# Patient Record
Sex: Male | Born: 1950
Health system: Southern US, Community
[De-identification: ages and names within clinical notes are randomized; demographics above are authoritative.]

## PROBLEM LIST (undated history)

## (undated) DIAGNOSIS — I10 Essential (primary) hypertension: Secondary | ICD-10-CM

## (undated) DIAGNOSIS — K922 Gastrointestinal hemorrhage, unspecified: Secondary | ICD-10-CM

## (undated) DIAGNOSIS — I480 Paroxysmal atrial fibrillation: Secondary | ICD-10-CM

## (undated) DIAGNOSIS — F32A Depression, unspecified: Secondary | ICD-10-CM

## (undated) DIAGNOSIS — I714 Abdominal aortic aneurysm, without rupture: Secondary | ICD-10-CM

## (undated) DIAGNOSIS — I2699 Other pulmonary embolism without acute cor pulmonale: Secondary | ICD-10-CM

## (undated) DIAGNOSIS — G8929 Other chronic pain: Secondary | ICD-10-CM

## (undated) DIAGNOSIS — R9431 Abnormal electrocardiogram [ECG] [EKG]: Secondary | ICD-10-CM

## (undated) DIAGNOSIS — F329 Major depressive disorder, single episode, unspecified: Secondary | ICD-10-CM

## (undated) DIAGNOSIS — R600 Localized edema: Secondary | ICD-10-CM

## (undated) DIAGNOSIS — F1021 Alcohol dependence, in remission: Secondary | ICD-10-CM

## (undated) DIAGNOSIS — E785 Hyperlipidemia, unspecified: Secondary | ICD-10-CM

## (undated) DIAGNOSIS — D649 Anemia, unspecified: Secondary | ICD-10-CM

## (undated) DIAGNOSIS — R911 Solitary pulmonary nodule: Secondary | ICD-10-CM

## (undated) DIAGNOSIS — I2721 Secondary pulmonary arterial hypertension: Secondary | ICD-10-CM

## (undated) DIAGNOSIS — Z6841 Body Mass Index (BMI) 40.0 and over, adult: Secondary | ICD-10-CM

## (undated) DIAGNOSIS — G4733 Obstructive sleep apnea (adult) (pediatric): Secondary | ICD-10-CM

## (undated) DIAGNOSIS — M170 Bilateral primary osteoarthritis of knee: Secondary | ICD-10-CM

## (undated) DIAGNOSIS — L409 Psoriasis, unspecified: Secondary | ICD-10-CM

## (undated) DIAGNOSIS — S7010XA Contusion of unspecified thigh, initial encounter: Secondary | ICD-10-CM

## (undated) DIAGNOSIS — Z7901 Long term (current) use of anticoagulants: Secondary | ICD-10-CM

## (undated) DIAGNOSIS — I723 Aneurysm of iliac artery: Secondary | ICD-10-CM

## (undated) HISTORY — DX: Body Mass Index (BMI) 40.0 and over, adult: Z684

## (undated) HISTORY — DX: Morbid (severe) obesity due to excess calories: E66.01

## (undated) HISTORY — DX: Localized edema: R60.0

## (undated) HISTORY — DX: Psoriasis, unspecified: L40.9

## (undated) HISTORY — DX: Abnormal electrocardiogram (ECG) (EKG): R94.31

## (undated) HISTORY — PX: KNEE ARTHROSCOPY: SHX127

## (undated) HISTORY — DX: Secondary pulmonary arterial hypertension: I27.21

## (undated) HISTORY — DX: Paroxysmal atrial fibrillation: I48.0

## (undated) HISTORY — DX: Alcohol dependence, in remission: F10.21

## (undated) HISTORY — DX: Gastrointestinal hemorrhage, unspecified: K92.2

## (undated) HISTORY — DX: Abdominal aortic aneurysm, without rupture: I71.4

## (undated) HISTORY — DX: Anemia, unspecified: D64.9

## (undated) HISTORY — DX: Hyperlipidemia, unspecified: E78.5

## (undated) HISTORY — PX: ACHILLES TENDON SURGERY: SHX542

## (undated) HISTORY — DX: Contusion of unspecified thigh, initial encounter: S70.10XA

## (undated) HISTORY — DX: Essential (primary) hypertension: I10

## (undated) HISTORY — DX: Long term (current) use of anticoagulants: Z79.01

## (undated) HISTORY — DX: Aneurysm of iliac artery: I72.3

## (undated) HISTORY — DX: Depression, unspecified: F32.A

## (undated) HISTORY — DX: Bilateral primary osteoarthritis of knee: M17.0

## (undated) HISTORY — DX: Major depressive disorder, single episode, unspecified: F32.9

## (undated) HISTORY — DX: Other pulmonary embolism without acute cor pulmonale: I26.99

## (undated) HISTORY — DX: Obstructive sleep apnea (adult) (pediatric): G47.33

## (undated) HISTORY — DX: Solitary pulmonary nodule: R91.1

---

## 1997-01-12 HISTORY — PX: KNEE SURGERY: SHX244

## 1997-05-04 ENCOUNTER — Other Ambulatory Visit: Admission: RE | Admit: 1997-05-04 | Discharge: 1997-05-04 | Payer: Self-pay | Admitting: Orthopedic Surgery

## 1998-12-03 ENCOUNTER — Ambulatory Visit (HOSPITAL_BASED_OUTPATIENT_CLINIC_OR_DEPARTMENT_OTHER): Admission: RE | Admit: 1998-12-03 | Discharge: 1998-12-03 | Payer: Self-pay | Admitting: Orthopedic Surgery

## 1999-10-12 ENCOUNTER — Emergency Department (HOSPITAL_COMMUNITY): Admission: EM | Admit: 1999-10-12 | Discharge: 1999-10-12 | Payer: Self-pay | Admitting: Emergency Medicine

## 1999-10-12 ENCOUNTER — Encounter: Payer: Self-pay | Admitting: Emergency Medicine

## 2001-01-20 ENCOUNTER — Encounter
Admission: RE | Admit: 2001-01-20 | Discharge: 2001-04-20 | Payer: Self-pay | Admitting: Physical Medicine and Rehabilitation

## 2001-06-16 ENCOUNTER — Encounter
Admission: RE | Admit: 2001-06-16 | Discharge: 2001-09-14 | Payer: Self-pay | Admitting: Physical Medicine and Rehabilitation

## 2006-01-12 DIAGNOSIS — I2699 Other pulmonary embolism without acute cor pulmonale: Secondary | ICD-10-CM

## 2006-01-12 DIAGNOSIS — R911 Solitary pulmonary nodule: Secondary | ICD-10-CM

## 2006-01-12 HISTORY — DX: Solitary pulmonary nodule: R91.1

## 2006-01-12 HISTORY — DX: Other pulmonary embolism without acute cor pulmonale: I26.99

## 2006-08-13 ENCOUNTER — Ambulatory Visit: Payer: Self-pay | Admitting: Vascular Surgery

## 2006-08-13 ENCOUNTER — Ambulatory Visit: Payer: Self-pay | Admitting: Pulmonary Disease

## 2006-08-13 ENCOUNTER — Encounter (INDEPENDENT_AMBULATORY_CARE_PROVIDER_SITE_OTHER): Payer: Self-pay | Admitting: Internal Medicine

## 2006-08-13 ENCOUNTER — Ambulatory Visit: Payer: Self-pay | Admitting: Cardiology

## 2006-08-13 ENCOUNTER — Inpatient Hospital Stay (HOSPITAL_COMMUNITY): Admission: EM | Admit: 2006-08-13 | Discharge: 2006-09-03 | Payer: Self-pay | Admitting: *Deleted

## 2008-05-08 IMAGING — CT CT PELVIS W/O CM
2 of 4 series · 15 of 46 positions shown, 17 images · non-contrast
Comparison: None

ABDOMEN CT WITHOUT CONTRAST

CLINICAL DATA: Pulmonary embolism. Increased gas and hyperactive bowel sounds
with no pain. Question ascites.
TECHNIQUE: Multidetector CT imaging of the abdomen and pelvis was performed
following the standard protocol without administration of intravenous contrast.

[Series 4: abd/pelv w/o 5.0 b31f st · axial · non-contrast · 0.92mm/px · z∈[-539,-59]mm · 12 of 110 slices shown, 14 images]
[im 9/110  soft-tissue]
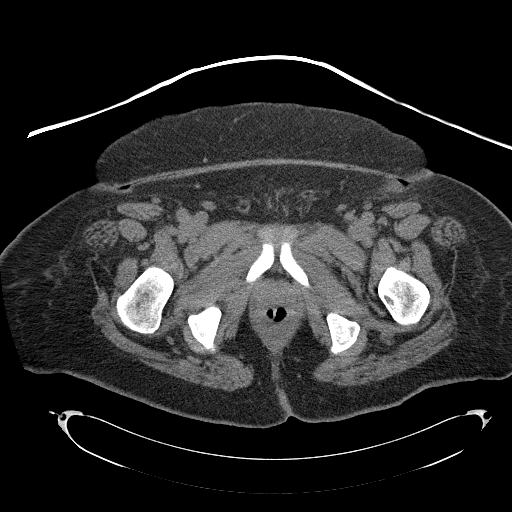
[im 9/110  bone]
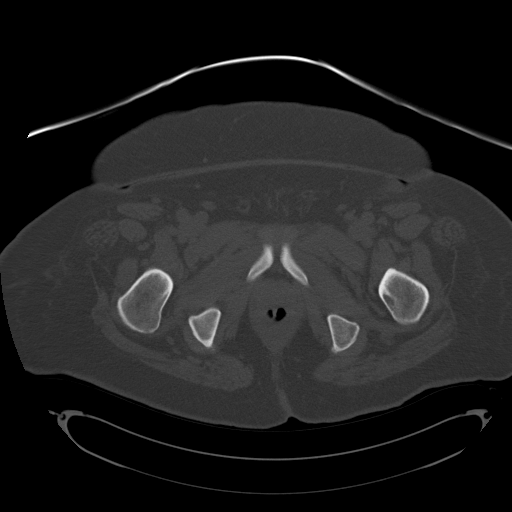
[im 18/110  soft-tissue]
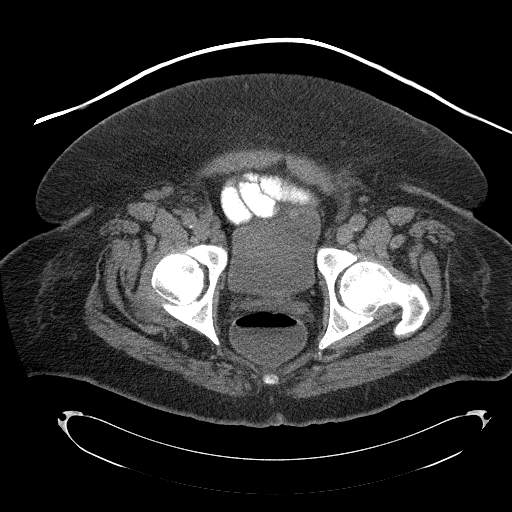
[im 27/110  soft-tissue]
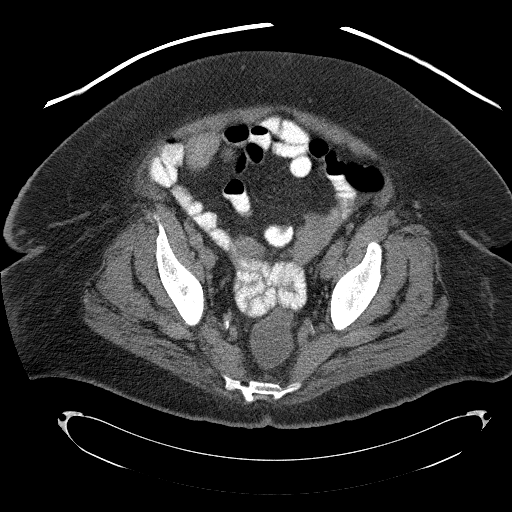
[im 35/110  soft-tissue]
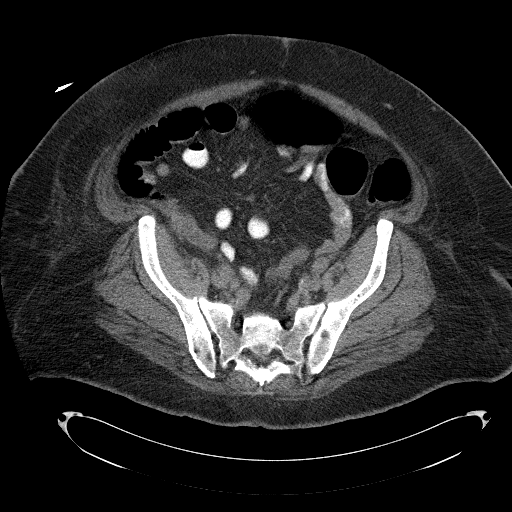
[im 44/110  soft-tissue]
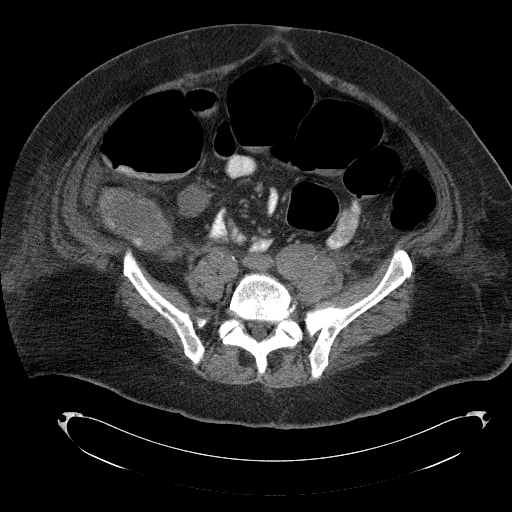
[im 53/110  soft-tissue]
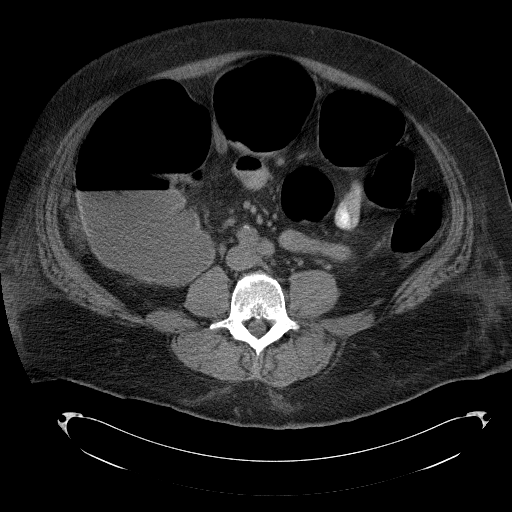
[im 62/110  soft-tissue]
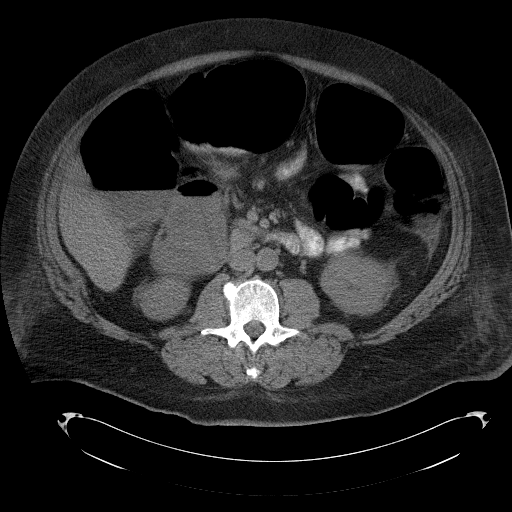
[im 70/110  soft-tissue]
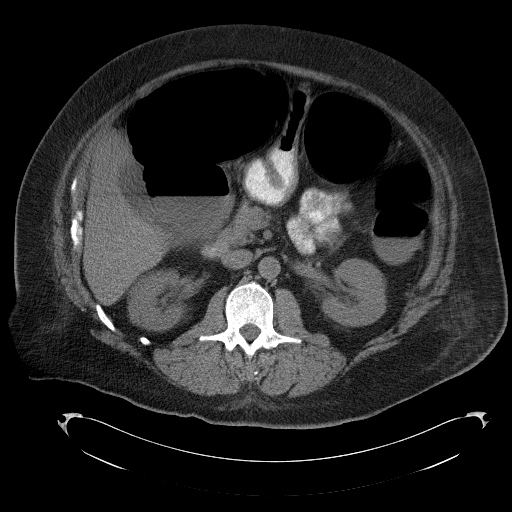
[im 79/110  soft-tissue]
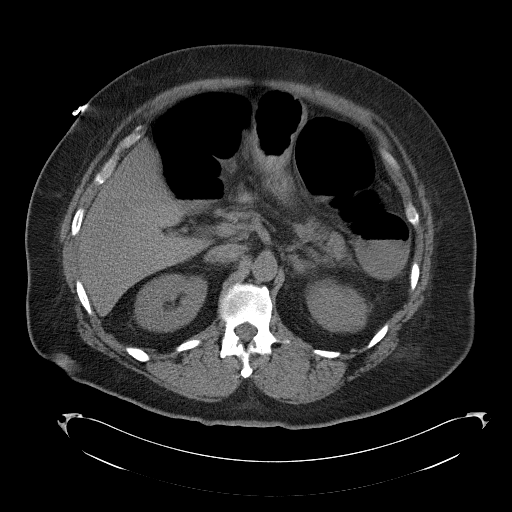
[im 79/110  bone]
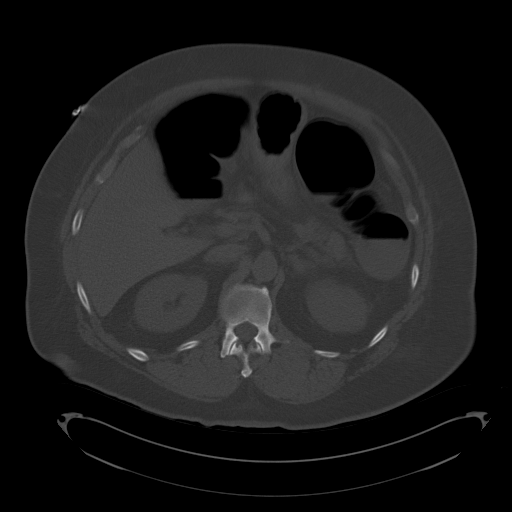
[im 88/110  soft-tissue]
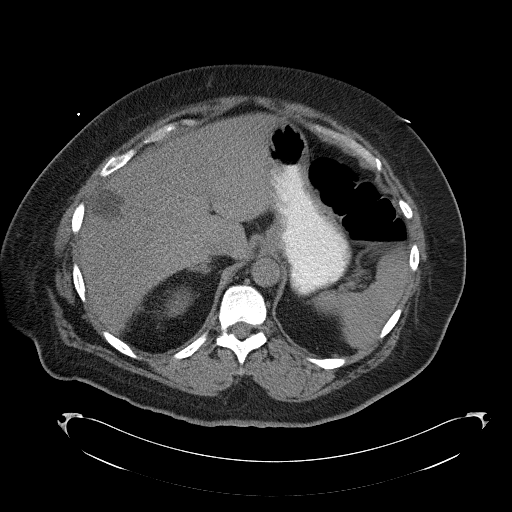
[im 96/110  soft-tissue]
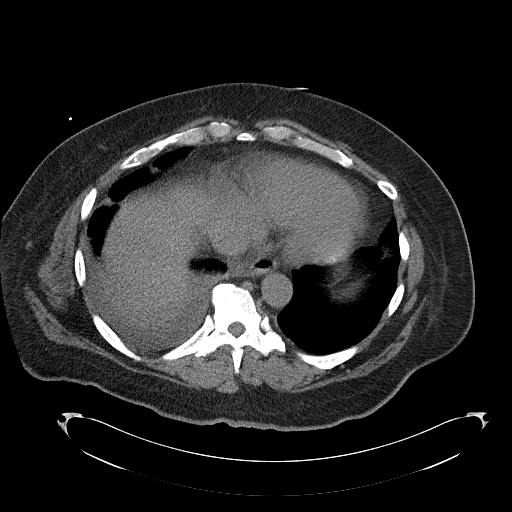
[im 105/110  soft-tissue]
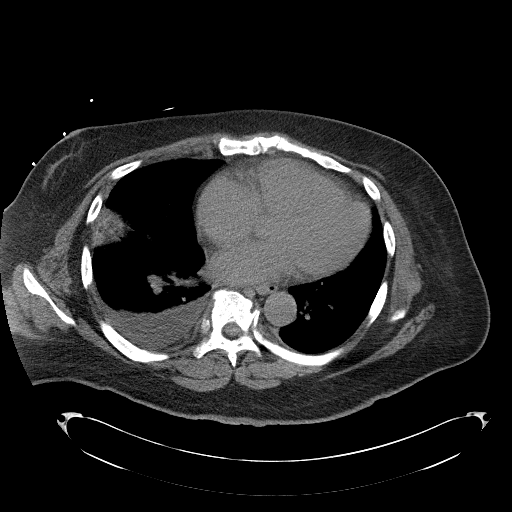

[Series 602: coronal abd · coronal · 1.07mm/px · 3 of 167 slices shown]
[im 56/167  soft-tissue]
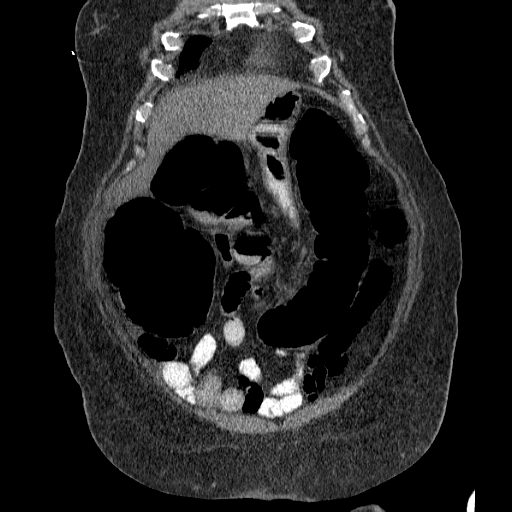
[im 74/167  soft-tissue]
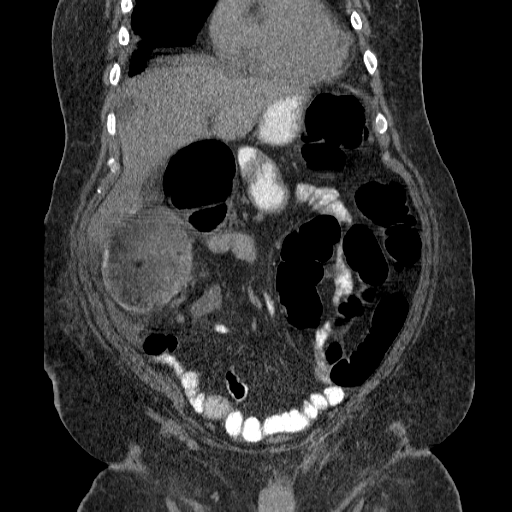
[im 93/167  soft-tissue]
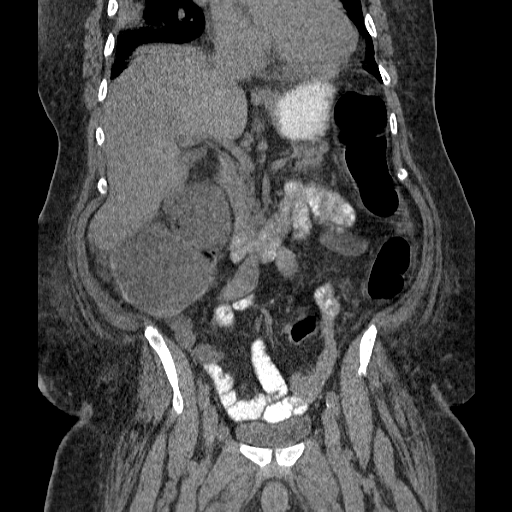

[15 of 46 positions shown; findings below may reference images not displayed]

FINDINGS: Motion artifact limits evaluation of lung bases. Patchy right middle
lobe air space disease could relate to atelectasis or infarct and is increased.
The nodular density described at the right lung base on the prior chest CT is
again identified on image 10 and measures similarly.

Moderate cardiomegaly. New small right pleural effusion. Low-density right
hepatic lobe lesion measures 3.2 cm and is most likely a cyst. A moderate motion
artifact involves the abdominal portions as well. Normal spleen, stomach,
pancreas, gallbladder, adrenal glands. Normal kidneys. Aneurysmal dilatation of
the right common iliac artery to 2.7 cm image 61.

No retroperitoneal or retrocrural adenopathy.

The colon is diffusely moderately distended and gas and fluid filled. There is
no colonic wall thickening or free intraperitoneal air. small bowel is of normal
caliber. Small amount of ascites in the right paracolic gutter on image 61.

IMPRESSION

1. Moderate colonic distention. Please see discussion in the pelvis section.
2. Worsening appearance of the right hemithorax with probable evolving infarct
and small-moderate right-sided pleural effusion.
3. Probable hepatic cyst.
4. Aneurysmal dilatation of the right common iliac artery to approximately
cm.
5. Trace ascites.
PELVIS CT WITHOUT CONTRAST
FINDINGS: Rectum is fluid filled. The colon is distended other than an area of
underdistention within the sigmoid. There is no convincing evidence of causative
mass at this point. Pelvic small bowel is of normal caliber. 

Aneurysm of the right common iliac terminates prior to the common iliac
bifurcation. Soft tissue fullness at the distal left common iliac artery on
image 67 is suspicious for aneurysm at approximately 3.2 cm. This area is
suboptimally evaluated without contrast.

Subtle edema is seen along the common iliac vasculature and iliopsoas muscles on
image 69 bilaterally. Ill-defined fat planes within the left iliopsoas complex
on image 67.

Prominent pelvic sidewall lymph nodes bilaterally. Index right external iliac
node measures 11 mm.

Normal urinary bladder and prostate.

Normal bones. Age advanced spondylosis in the lower lumbar spine incidentally
noted.

IMPRESSION

1. Moderate colonic distention throughout except for an area of underdistention
in the sigmoid. This is felt most likely be diffuse colonic ileus with
incidentally under distended sigmoid. If this distention persists, repeat
imaging or colonoscopy would be suggested to exclude a less likely partially
obstructive process at the sigmoid colon.
2. Bilateral common iliac artery aneurysms, suboptimally evaluated on this
unenhanced CT. Subtle surrounding retroperitoneal edema including along the
iliopsoas muscles. This could be incidental and due to diffuse body wall edema.
However, given the aneurysms and the patient's presumed anticoagulated state,
clinical correlation is recommended. If patient has developing pelvic symptoms,
repeat imaging should be considered to exclude developing iliopsoas hematoma or
less likely common iliac artery aneurysm bleed.
3. Mild pelvic adenopathy without cause identified.

## 2008-05-09 IMAGING — CR DG ABD PORTABLE 1V
1 series · 1 of 1 positions shown · non-contrast
Comparison: none

CLINICAL DATA: Abdominal distention.  
 PORTABLE ABDOMEN ? 1 VIEW ? 08/21/06 ? 9399 HOURS:

[view not recorded]
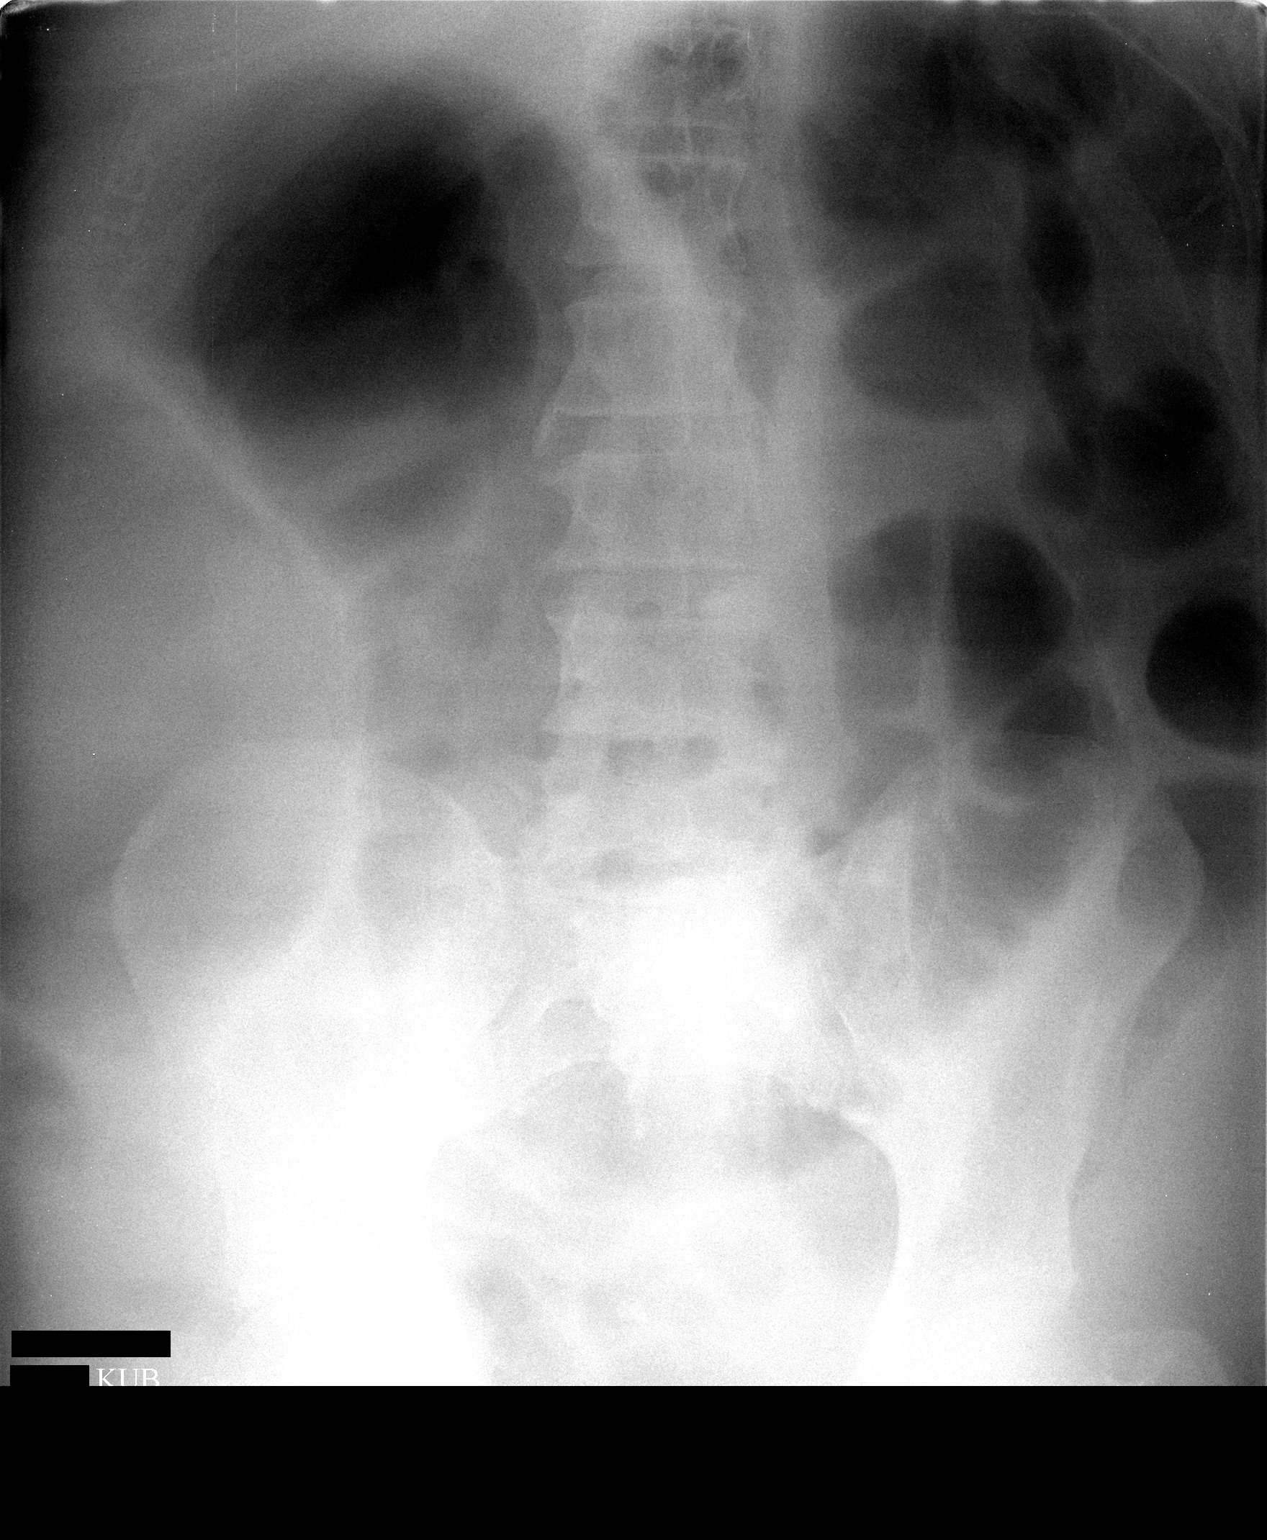

[1 of 1 positions shown; findings below may reference images not displayed]

FINDINGS: The exam is limited by motion artifact.  Colonic distention is grossly stable.  There is no obvious free intraperitoneal gas or pneumatosis.
IMPRESSION: Colonic distention.  No significant change.

## 2010-02-28 DIAGNOSIS — I480 Paroxysmal atrial fibrillation: Secondary | ICD-10-CM

## 2010-02-28 HISTORY — DX: Paroxysmal atrial fibrillation: I48.0

## 2010-05-01 ENCOUNTER — Other Ambulatory Visit: Payer: Self-pay | Admitting: Cardiology

## 2010-05-01 ENCOUNTER — Ambulatory Visit
Admission: RE | Admit: 2010-05-01 | Discharge: 2010-05-01 | Disposition: A | Discharge status: Home / Self Care | Payer: Medicare Other | Source: Ambulatory Visit | Attending: Cardiology | Admitting: Cardiology

## 2010-05-01 DIAGNOSIS — R0602 Shortness of breath: Secondary | ICD-10-CM

## 2010-05-05 ENCOUNTER — Ambulatory Visit (HOSPITAL_COMMUNITY)
Admission: RE | Admit: 2010-05-05 | Discharge: 2010-05-05 | Disposition: A | Discharge status: Home / Self Care | Payer: Medicare Other | Source: Ambulatory Visit | Attending: Cardiology | Admitting: Cardiology

## 2010-05-05 DIAGNOSIS — Z01810 Encounter for preprocedural cardiovascular examination: Secondary | ICD-10-CM | POA: Insufficient documentation

## 2010-05-05 DIAGNOSIS — Z538 Procedure and treatment not carried out for other reasons: Secondary | ICD-10-CM | POA: Insufficient documentation

## 2010-05-05 DIAGNOSIS — I4891 Unspecified atrial fibrillation: Secondary | ICD-10-CM | POA: Insufficient documentation

## 2010-05-27 NOTE — H&P (Signed)
[name], [name]                 ACCOUNT NO.:  [id number]   MEDICAL RECORD NO.:  [id number]          PATIENT TYPE:  INP   LOCATION:  2902                         FACILITY:  [facility]   PHYSICIAN:  Hind I Elsaid, MD      DATE OF BIRTH:  12/16/1950   DATE OF ADMISSION:  08/12/2006  DATE OF DISCHARGE:                              HISTORY & PHYSICAL   PRIMARY CARE PHYSICIAN:  __________   CHIEF COMPLAINT:  Shortness of breath.   HISTORY OF PRESENT ILLNESS:  This is a 60 year old male with a history  of psoriasis admitted to the hospital with a chief complaint of sudden  onset of shortness of breath which started on Sunday.  Patient  complained of shortness of breath mainly on mild exertion which is  happening suddenly, relieved by sitting.  Patient denies any history of  wheezing.  Denies any lower extremity swelling.  Denies any fever.  Denies any cough.  Denies any chest pain.  Patient also felt some  dizziness and about to pass out on Sunday.  Denies any headache,  numbness or weakness of his extremities.   PAST MEDICAL HISTORY:  Psoriasis.   MEDICATIONS:  None.   PAST SURGICAL HISTORY:  History of bilateral knee arthroscopic  procedures, repair of  tendinitis and left knee surgeries.   ALLERGIES:  NO KNOWN DRUG ALLERGIES.   SYSTEMIC REVIEW:  Review HPI.   SOCIAL HISTORY:  He lives by himself.  He is divorced.  Quit smoking on  Monday, but he can be put on the list of current smoker, half a pack a  day for 20 years.  Drinks 1 bottle daily for 20 years.  Last drink was  Monday.  History of marijuana abuse, stopped more than 1 year ago.  Patient denies any history of DTs in the past.   FAMILY HISTORY:  Denies any history of clot in his family.   PHYSICAL EXAMINATION:  NEUROLOGIC:  Patient is alert and oriented x3.  VITAL SIGNS:  Temperature 97.2, blood pressure 143/94, pulse rate 99,  respiratory rate 24, 98% on room air.  GENERAL:  Obese African American male.  RESPIRATORY:   Not in respiratory distress.  Mild shortness of breath.  HEENT:  Normocephalic and atraumatic.  Pupils equal and reactive to  light and accommodation.  NECK:  Has a double chin.  No lymphadenopathy.  Evaluated the JVP.  LUNGS:  No vesicular breathing.  HEART:  S1 and S2.  No added sounds.  ABDOMEN:  Obese, nontender.  Bowel sounds positive.  No organomegaly.  EXTREMITIES:  No lower limb edema.  SKIN:  Multiple skin rashes secondary to psoriasis.   LABORATORY DATA:  Blood work up:  Sodium 136, potassium 30.8, chloride  106, BUN 18, glucose 90, bicarb 22, white blood cell 9.3, hemoglobin  15.7. hematocrit 47.2, platelets 152.  Troponin was negative.  D-dimer  more than 20.  CT scan showed positive for pulmonary embolism  bilaterally.  Dilatation of the right ventricular pulmonary artery such  as right ventricular strain.  Noted density of the right lung base,  pleuritic base and probably benign.  Correlation was on ultrasound.  In  the absence of exam, I will suggest CT followup nonspecific 3.4 cm  lesion in the right hepatic lobe.   ASSESSMENT AND PLAN:  1. Massive bilateral pulmonary embolism with the possibility of right      ventricular strain.  Patient is hemodynamically stable at this      time.  Admit to stepdown.  Start heparin drip.  Check CBC and      PT/INR.  We will consider pulmonary consult for evaluation for      possibility of thrombolytic therapy although patent has good vital      sign and haemodynamically stable ,venous doppler of bilateral      extremities was orderd too.  We will get 2-D echocardiogram for      evaluation of the right heart strain.  2. History of alcohol abuse.  We will continue with thiamnie and      folate no need for ativan at this tim,t.  Patient has not had      delirium tremens at this time.  3. Psoriasis.  Continue home medications for psoriasis.  4. Deep venous thrombosis and gastrointestinal prophylaxis.      Hind Bosie Helper, MD   Electronically Signed     HIE/MEDQ  D:  08/13/2006  T:  08/13/2006  Job:  [id number]

## 2010-05-27 NOTE — Discharge Summary (Signed)
[name], [name]                 ACCOUNT NO.:  [id number]   MEDICAL RECORD NO.:  [id number]          PATIENT TYPE:  INP   LOCATION:  2902                         FACILITY:  [facility]   PHYSICIAN:  Lonia Blood, M.D.       DATE OF BIRTH:  [birth year]   DATE OF ADMISSION:  08/13/2006  DATE OF DISCHARGE:                               DISCHARGE SUMMARY   PRIMARY CARE PHYSICIAN:  Dr. Leonard Downing   DISCHARGE DIAGNOSES:  1. Submassive pulmonary embolus affecting the main branches of the      pulmonary arteries with right ventricular strain.  2. Estimated pulmonary artery pressure of 85 mmHg.  3. Obesity.  4. A 2.5 cm right lower lobe nodule - the patient will need workup      when more stable clinically  5. Paroxysmal atrial fibrillation, resolved.  6. Paroxysmal atrial flutter resolved.  7. Streptococcus viridans urinary tract infection with urinary      retention.  8. Mild hematuria in the setting of a Foley catheter and heparin      anticoagulation.   DISCHARGE MEDICATIONS:  Coumadin dose to be determined at the time of  discharge.   CONDITION ON DISCHARGE:  Will be dictated at the time of the actual  discharge of the patient.  Of note, Mr. Common has a visit scheduled with  his new primary care physician Dr. Leonard Downing.   PROCEDURES:  1. During this admission on August 13 2006, the patient underwent      computer tomography scan of the chest.  Findings of right lower      lobe pulmonary nodule of 2.3 cm, massive pulmonary embolus      bilaterally with a large saddle embolus straddling the proximal      right and left pulmonary arteries, nearly occlusive thrombus in the      right main pulmonary artery, dilatation of the right ventricle and      a nonspecific 3.4 cm mass in the right hepatic lobe.   1. August 14, 2006, transthoracic echocardiogram.  Findings of      pulmonary artery hypertension 85 mmHg, ejection fraction of 65%,      right ventricle moderately dilated right  ventricular systolic      function was moderately reduced.   CONSULTATIONS:  This patient was seen in consultation by Dr. Shan Levans from pulmonary.   HISTORY AND PHYSICAL:  Refer to the dictation done by Dr. Eda Paschal on  August 13, 2006.   HOSPITAL COURSE:  #1 - MASSIVE PULMONARY EMBOLISM.  Mr. Dismuke was  admitted to the intensive care unit of [facility] and was  placed on heparin drip.  The patient was kept on bedrest initially due  to his orthostatic hypotension.  The patient never became shocky. Mr.  Mclester had a couple of episodes of atrial fibrillation and one episode of  atrial flutter.  Arrhythmias were probably related to the pulmonary  emboli that patient suffered.  We have checked lower extremity venous  Dopplers which were positive for a popliteal tibial vein thrombosis on  the  left only.  The patient did not receive the tissue plasminogen  activator due to the fact that he was never in any hemodynamic  compromise.  We also decided not to place an IVC filter given the fact  that the residual clot burden was low.  At this point in time, it is  unclear why Mr. Doucette developed this massive pulmonary embolus.  He does  not have any family history of coagulopathy, and his genetic testing for  factor V Leiden,  antithrombin III, prothrombin gene mutation, are all  negative.  We have also tested this patient for the presence of  antiphospholipid antibody, and we did not detect the presence of the  lupus anticoagulant.  Mr. Maselli has a right lower lobe pulmonary nodule  and a hepatic nodule which both need to be investigated for possible  malignancy, once he is more hemodynamically stable.  The current plan is  to treat the patient with Coumadin for a month and repeat the CT scan of  his chest, abdomen and pelvis to evaluate the findings on the first  computer tomography scan.  Mr. Lard also probably will warrant  colonoscopy at some point in the future too.   #2 -  URINARY RETENTION.  On hospital day #2, Mr. Kray was unable to  void.  Foley catheter was inserted and urine culture was sent.  Urine  culture was positive for Streptococcus, and the patient was treated with  5 days of amoxicillin.  A PSA was checked and it was within normal  limits.   #3 - The current plan of treatment involves increase mobilization of the  patient.  Ongoing heparin drip until the INR is 2.5 for two consecutive  days.  The final decision to discharge this patient will be taken by Dr.  Eda Paschal, depending on further hospital course of the patient.      Lonia Blood, M.D.  Electronically Signed     SL/MEDQ  D:  08/17/2006  T:  08/18/2006  Job:  [id number]

## 2010-05-27 NOTE — Discharge Summary (Signed)
[name], [name]                 ACCOUNT NO.:  [id number]   MEDICAL RECORD NO.:  [id number]          PATIENT TYPE:  INP   LOCATION:  2031                         FACILITY:  [facility]   PHYSICIAN:  Elliot Cousin, M.D.    DATE OF BIRTH:  05-May-1950   DATE OF ADMISSION:  08/12/2006  DATE OF DISCHARGE:  09/03/2006                               DISCHARGE SUMMARY   ADDENDUM DISCHARGE SUMMARY:  PLEASE SEE THE PREVIOUS DISCHARGE SUMMARIES  DICTATED BY DR. Eda Paschal ON August 24, 2006 AND DR. LAZA ON August 17, 2006.   FINAL DISCHARGE DIAGNOSES:  1. Left lower extremity infected hematoma, status post local incision      and drainage at the bedside, per Dr. Jamey Ripa, on August 30, 2006.  2. Massive pulmonary embolism with evidence of right ventricular      strain and possible right lower lobe infarction.  3. Paroxysmal atrial fibrillation.  4. Prolonged Q-T interval.   SECONDARY DISCHARGE DIAGNOSES:  1. Persistent hypokalemia.  The patient's serum potassium is now      within normal limits.  2. Ileus.  The patient's ileus resolved during the hospital course.  3. A 2.5 cm right lower lobe nodule.  Follow-up CT scan of the chest      recommended in 3-6 months.  4. Mild hematuria in a setting of a Foley catheter and heparin      anticoagulation, resolved during the hospital course.  5. Streptococcus viridans urinary tract infection with urinary      retention.  Treated and resolved during the hospital course.  6. Thrombophlebitis of the left hand, resolved during the hospital      course.   DISCHARGE MEDICATIONS:  1. Cardizem 360 mg daily.  2. Digoxin 0.125 mg daily (this medication may be discontinued over      the next week or two per the discretion of his primary care      physician).  3. Augmentin 875 mg 1 p.o. b.i.d. for three more days.  4. Potassium chloride 20 mEq b.i.d. for one more week, then one pill      daily.  5. Tussionex cough syrup 5 cc every 12 hours as needed for cough.  6.  Coumadin 5 mg.  Take 2 tablets at 6 p.m. every day.   DISCHARGE DISPOSITION:  Patient was discharged to home in improved and  stable condition on September 03, 2006.  He has a hospital follow-up  appointment with Dr. Kern Reap on Monday, August 25th, at 11:30  a.m.  He was advised to follow up with general surgeon, Dr. Jamey Ripa, in  two weeks.   CONSULTATIONS:  Dr. Carolynne Edouard, Dr. Jamey Ripa, general surgery.   HOSPITAL COURSE:  1. LEFT LOWER EXTREMITY INFECTED HEMATOMA:  On August 17, the patient      was noted to have a large fluctuant, painful area within his left      inner thigh.  There was a concern regarding a hematoma versus an      abscess.  General surgeon, Dr. Carolynne Edouard, was consulted.  Given that the  patient was on anticoagulation, he advised a mild reversal of the      Coumadin before proceeding with a possibility of draining the area.      The following day, Dr. Billey Chang colleague, Dr. Jamey Ripa, evaluated the      patient.  He performed a bedside/local drainage and I&D of the left      inner thigh fluctuant area.  Dr. Jamey Ripa sent the specimen to the      lab for culture.  The patient was started empirically on Zosyn      intravenously.   The I&D site was dressed daily with wet-to-dry dressing changes and  packing.  The area has drained serosanguineous fluid over the past few  days, which has been minor.  His hemoglobin has remained stable over the  past few days.  It took several days for the culture to grow out.  In  fact, the specimen had to be incubated for better growth.  Eventually,  the culture grew out multiple species, none strep and none staph aureus.  The patient has completed five days of Zosyn therapy.  He will be  discharged to home on three more days of Augmentin.  The patient is  completely afebrile, and his white blood cell count is within normal  limits at 9.1.  The patient will follow up with Dr. Jamey Ripa in two weeks,  per his schedule.   1. PULMONARY EMBOLISM:   The patient's oxygen saturations continued to      improved during the hospitalization.  Over the past few days, his      oxygen saturations have ranged from 95% to 98% on room air.  He has      no complaints of chest pain or shortness of breath.  He is being      actively anticoagulated with Coumadin.  As of yesterday, his INR      was 2.3.  The day prior, it was 2.  The dosing of the Coumadin has      ranged from 7.5 to 12.5 mg daily.  He will be discharged to home on      10 mg of Coumadin daily.  Patient was advised to follow up with Dr.      Barbee Shropshire in three days to have his INR reassessed.   1. PAROXYSMAL ATRIAL FIBRILLATION:  The patient was started on      Cardizem for rate control.  The dose was titrated up to 360 mg      daily.  Eventually, digoxin had to be added.  He is currently      receiving 0.125 mg of digoxin daily.  Over the past 48 hours, his      heart rate has ranged from 80 to 95 beats per minute.  The follow-      up EKG revealed a prolonged Q-T interval of 428 with a QTC of 487.      More than likely, the prolonged Q-T interval was secondary to      digoxin therapy.  The EKG also shows nonspecific T wave      abnormalities and possible left atrial enlargement.  The patient      has had no complaints of chest pain over the past three days.  His      EKG changes will need to be monitored in the outpatient setting.      It is possible that digoxin can be discontinued over the next week      or  two.  This decision will be deferred to his primary care      physician, Dr. Barbee Shropshire.   1. PERSISTENT HYPOKALEMIA:  The patient is currently receiving 20 mEq      of potassium chloride twice daily.  His magnesium level was      assessed and found to be within normal limits at 2.1 today.  He was      advised to continue potassium chloride b.i.d. for one week and then      to decrease the dose to once daily.  The patient's serum potassium      will need to be followed closely  in the outpatient setting.   FOLLOW-UP DISCHARGE DISPOSITION:  The [facility] agency  has been consulted to provide the patient with wound care.  Per Dr.  Tenna Child instructions, the left medial thigh wound should be dressed  with wet-to-dry dressings and packing with a dry dressing cover.      Elliot Cousin, M.D.  Electronically Signed    DF/MEDQ  D:  09/03/2006  T:  09/04/2006  Job:  [id number]   cc:   Olene Craven, M.D.  Currie Paris, M.D.

## 2010-05-27 NOTE — Discharge Summary (Signed)
NAMESHEDDRICK, LATTANZIO                 ACCOUNT NO.:  [id number]   MEDICAL RECORD NO.:  [id number]          PATIENT TYPE:  INP   LOCATION:  2923                         FACILITY:  [facility]   PHYSICIAN:  Hind I Elsaid, MD      DATE OF BIRTH:  [birth year]   DATE OF ADMISSION:  08/12/2006  DATE OF DISCHARGE:                               DISCHARGE SUMMARY   INTERIM DISCHARGE SUMMARY:   PRIMARY CARE PHYSICIAN:  Dr. Marney Setting.   DISCHARGE DIAGNOSES:  1. Massive pulmonary embolism, effecting the main branches of the      pulmonary arteries with right ventricle strain.  2. Estimated pulmonary artery pressure of 85.  3. Paroxysmal atrial fibrillation/atrial flutter, converted to normal      sinus rhythm.  4. Persistent hypokalemia.  5. Colonic ileus, secondary to persistent hypokalemia, not resolved      yet.  6. Thrombophlebitis of the left hand.   MEDICATIONS:  To be updated at the date of discharge.   CONSULTATIONS:  Gastroenterology consulted for the persistent abdominal  distention.   PROCEDURE:  1. August 8, CT of abdomen and pelvis, moderate colonic distention,      worsening appearance of the right hemothorax with probable infarct      and small right-sided pleural effusion, probable hepatic cyst,      aneurysmal dilatation of the right common iliac artery of      approximately 7 cm and trace ascites.  2. Abdominal x-ray, done on August 9, chronic distention.  No      significant chain,  3. Abdominal x-ray, done on August, chronic distention with mild      increase distention of the cecum.   PROBLEM LIST:  1. Massive pulmonary embolism.  Patient remained hemodynamically      stable.  Continued to receive Lovenox and Coumadin.  INR became      therapeutic.  Lovenox was discontinued and during hospitalization,      patient had a couple of episodes of atrial fibrillation and atrial      flutter, which was most probably related to pulmonary embolism.      Covering the period  from August 7, patient started to develop      another episode of atrial flutter and atrial fibrillation.  We have      started the patient on digoxin, loading dose, and Cardizem,      increase the dose to 360 mg p.o. daily.  The patient converted to      normal sinus rhythm and right now patient on Cardizem and digoxin.      During hospitalization, patient remained hemodynamically stable.  2. Persistent hypokalemia, which is still a remaining issue.  During      hospitalization, we supplement with 40 mEq p.o. t.i.d. and kept      potassium around 3.3, magnesium was corrected and phosphorus was      corrected and we hopefully potassium will be corrected with a few      days.  If there is no significant correction of potassium, we will  consult nephrology to help with further management.  3. Colonic distention.  We felt it was due to electrolyte imbalance.      Gastroenterology were consulted, where they recommend to achieve      potassium level of 4.0 and to repeat abdominal x-ray.  Patient      still have colonic distention on x-ray with some cecum distention,      we will aggressively correct potassium and further recommendation      by gastroenterologist.   DISPOSITION:  Patient can be discharged home after correction of his  potassium and resolution of colonic ileus.  Deep venous thrombosis and  gastrointestinal prophylaxis.      Hind Bosie Helper, MD  Electronically Signed     HIE/MEDQ  D:  08/24/2006  T:  08/24/2006  Job:  [id number]

## 2010-05-27 NOTE — Consult Note (Signed)
[name], Padilla                 ACCOUNT NO.:  [id number]   MEDICAL RECORD NO.:  [id number]          PATIENT TYPE:  INP   LOCATION:  2031                         FACILITY:  [facility]   PHYSICIAN:  Ollen Gross. Vernell Morgans, M.D. DATE OF BIRTH:  28-Jan-1950   DATE OF CONSULTATION:  DATE OF DISCHARGE:                                 CONSULTATION   [name] is a 60 year old black male who was admitted to the hospital  originally with a massive pulmonary emboli.  He was treated for this and  is improved.  He is now on the floor doing well.  He is tolerating diet  and is nearing discharge.  He is on Coumadin and his INR now is 2.  He  notes that over the last few days he has developed a painful swollen  area on the upper inner left thigh.  He has had these in this area  before and usually they rupture and drain spontaneously and then  resolve, but this one has not.  He denies any fevers or chills.  No  nausea, vomiting.  No chest pain, shortness of breath, diarrhea or  dysuria currently.  His other review of systems is unremarkable.   PAST MEDICAL HISTORY:  Significant for a pulmonary emboli with pulmonary  artery hypertension, paroxysmal atrial fibrillation, hypokalemia,  colonic ileus, thrombophlebitis, had psoriasis.   PAST SURGICAL HISTORY:  Significant for bilateral knee arthroscopic  repairs.   MEDICATIONS:  None except now he is on Coumadin.   ALLERGIES:  No known drug allergies.   SOCIAL HISTORY:  He quit smoking about a week or two ago.  Drinks about  a bottle of alcohol a day for the last 20 years.   FAMILY HISTORY:  Noncontributory.   PHYSICAL EXAMINATION:  He is afebrile with stable vitals.  In general,  he is a well-developed, well-nourished black male in no acute distress.  SKIN:  Warm and dry.  No jaundice.  EYES:  Extraocular muscles were intact.  Pupils equal, round, reactive  to light.  Sclerae nonicteric.  LUNGS:  Clear bilaterally with no use of accessory muscles.  HEART:  Regular rate and rhythm with an impulse in the left chest.  ABDOMEN:  Soft and nontender.  No palpable masses or hepatosplenomegaly.  EXTREMITIES:  No cyanosis, clubbing or edema.  He does have a large,  fluctuant, tender red area in the upper inner left thigh.  PSYCHOLOGIC:  Alert and oriented x3.  No anxiety or depression.   ASSESSMENT AND PLAN:  This is a 60 year old black male with what appears  to be a large abscess in the upper inner left thigh.  This will probably  need to be incised and drained.  He would need to be off his Coumadin  with normal coags prior to doing this.  We will continue to follow him  along with you.      Ollen Gross. Vernell Morgans, M.D.  Electronically Signed     PST/MEDQ  D:  08/29/2006  T:  08/29/2006  Job:  [id number]

## 2010-05-27 NOTE — Consult Note (Signed)
[name], [name]                 ACCOUNT NO.:  [id number]   MEDICAL RECORD NO.:  [id number]          PATIENT TYPE:  INP   LOCATION:  2923                         FACILITY:  [facility]   PHYSICIAN:  Fayrene Fearing L. Malon Kindle., M.D.DATE OF BIRTH:  [birth year]   DATE OF CONSULTATION:  08/20/2006  DATE OF DISCHARGE:                                 CONSULTATION   REQUESTING PHYSICIAN:  Dr. Lavera Guise.   PRIMARY CARE PHYSICIAN:  Dr. Delanna Notice.   This is a very nice 60 year old African American male, who was admitted  on the 31st with shortness of breath.  He was found to have a submassive  pulmonary embolus and was found to have atrial fibrillation and flutter.  He also has a Streptococcus iridium urinary tract infection.  He has  been treated with heparin, is now down over to Coumadin.  He is doing  better, has not really had much shortness of breath since then.  He is  currently on Coumadin.  He is on some amoxicillin for his UTI.  We are  asked to see him today, after he had complained of some abdominal pain.  He says he has been having some cramping pain and felt somewhat  uncomfortable after eating but had been eating well.  A CT scan was done  of his abdomen to rule out retroperitoneal bleed or intra-abdominal  bleed, due to his anticoagulation.  This showed a diffusely distended  colon with air fluid levels.  The patient had had a few loose stools,  been on Colace.  There were no signs of entry of intra-abdominal or  retroperitoneal bleeding.  Due to his dilated colon, we are asked to see  him.  He was told his potassium today is 3.2.   CURRENT MEDICATIONS:  Protonix, Senokot, Colace, Coumadin, Diltiazem,  potassium tablets, Phenergan, Tylenol, oxycodone and Ambien.   ALLERGIES:  NO KNOWN DRUG ALLERGIES.   MEDICAL HISTORY:  Had a history of psoriasis, has had arthroscopy on his  knees and some orthopedic procedures, no intra-abdominal procedures.   FAMILY HISTORY:  Noncontributory.   SOCIAL  HISTORY:  The patient is divorced, lives alone, has been a  smoker.  Drinks a beer a day.   PHYSICAL EXAMINATION:  VITAL SIGNS:  Unremarkable.  GENERAL:  Alert, oriented African-American male sitting up in the bed  watching TV and eating supper.  HEENT:  Eyes appear nonicteric.  HEART:  Regular rate and rhythm.  No murmurs or gallops.  LUNGS:  Diminished breath sounds and right but otherwise normal.  ABDOMEN:  Obese, distended and somewhat tympanic with minimal if any  bowel sounds.   CT:  Looking at the CT, there does seem to be some slight thickening of  the sigmoid colon, but there is air down to this level with no obvious  diverticulitis or anything of this nature.   ASSESSMENT:  Chronic distension, probably due to hypokalemia, doubt this  represents a true obstruction.   PLAN:  Will try to correct the potassium, do the best to try to get this  up to 4-1/2 to 5.  If  he is not clearly improved for this regimen, I  would go with a gastrograph on this next step.           ______________________________  Llana Aliment. Malon Kindle., M.D.     Waldron Session  D:  08/20/2006  T:  08/22/2006  Job:  [id number]   cc:   Olene Craven, M.D.  James L. Malon Kindle., M.D.

## 2010-10-24 LAB — BASIC METABOLIC PANEL
BUN: 4 — ABNORMAL LOW
BUN: 7
BUN: 7
CO2: 23
CO2: 23
CO2: 27
Calcium: 8.7
Calcium: 8.7
Chloride: 102
Chloride: 104
Chloride: 105
Creatinine, Ser: 0.8
GFR calc Af Amer: >60
GFR calc Af Amer: >60
GFR calc Af Amer: >60
GFR calc non Af Amer: >60
GFR calc non Af Amer: >60
GFR calc non Af Amer: >60
GFR calc non Af Amer: >60
Glucose, Bld: 100 — ABNORMAL HIGH
Glucose, Bld: 86
Glucose, Bld: 88
Potassium: 4
Potassium: 4.1
Potassium: 4.1
Potassium: 4.2
Potassium: 4.5
Sodium: 134 — ABNORMAL LOW
Sodium: 135
Sodium: 136
Sodium: 138
Sodium: 138

## 2010-10-24 LAB — PROTIME-INR
INR: 1.8 — ABNORMAL HIGH
INR: 2 — ABNORMAL HIGH
INR: 2 — ABNORMAL HIGH
Prothrombin Time: 18.6 — ABNORMAL HIGH
Prothrombin Time: 23.7 — ABNORMAL HIGH

## 2010-10-24 LAB — CBC
HCT: 38.9 — ABNORMAL LOW
HCT: 39.3
Hemoglobin: 13
Hemoglobin: 13.1
MCHC: 33.6
MCV: 97.3
MCV: 98.9
Platelets: 366
RBC: 3.94 — ABNORMAL LOW
RBC: 3.97 — ABNORMAL LOW
RBC: 4 — ABNORMAL LOW
WBC: 7.4
WBC: 9.1
WBC: 9.3

## 2010-10-24 LAB — CULTURE, ROUTINE-ABSCESS

## 2010-10-27 LAB — CBC
HCT: 38.7 — ABNORMAL LOW
HCT: 40.8
HCT: 40.8
HCT: 41.2
HCT: 41.6
HCT: 41.7
HCT: 44.4
HCT: 47.2
Hemoglobin: 13
Hemoglobin: 13.6
Hemoglobin: 13.8
Hemoglobin: 13.9
Hemoglobin: 14
Hemoglobin: 14
Hemoglobin: 14.1
Hemoglobin: 14.9
Hemoglobin: 15.7
MCHC: 33.3
MCHC: 33.3
MCHC: 33.5
MCHC: 33.6
MCHC: 33.7
MCHC: 33.8
MCHC: 33.8
MCHC: 33.8
MCV: 100.1 — ABNORMAL HIGH
MCV: 98.3
MCV: 98.7
MCV: 98.9
MCV: 99
MCV: 99.1
MCV: 99.2
MCV: 99.3
MCV: 99.4
MCV: 99.6
MCV: 99.7
MCV: 99.5
Platelets: 193
Platelets: 210
Platelets: 230
Platelets: 252
Platelets: 280
Platelets: 308
Platelets: 312
Platelets: 323
Platelets: 342
RBC: 4.11 — ABNORMAL LOW
RBC: 4.12 — ABNORMAL LOW
RBC: 4.15 — ABNORMAL LOW
RBC: 4.19 — ABNORMAL LOW
RBC: 4.2 — ABNORMAL LOW
RBC: 4.5
RBC: 4.68
RBC: 4.74
RDW: 15.3 — ABNORMAL HIGH
RDW: 15.5 — ABNORMAL HIGH
RDW: 15.5 — ABNORMAL HIGH
RDW: 15.6 — ABNORMAL HIGH
RDW: 15.6 — ABNORMAL HIGH
RDW: 15.6 — ABNORMAL HIGH
RDW: 15.7 — ABNORMAL HIGH
RDW: 15.8 — ABNORMAL HIGH
WBC: 6.1
WBC: 6.6
WBC: 6.7
WBC: 6.9
WBC: 7.3
WBC: 8.1
WBC: 8.3
WBC: 9.1

## 2010-10-27 LAB — COMPREHENSIVE METABOLIC PANEL
AST: 17
Albumin: 2.3 — ABNORMAL LOW
Albumin: 3 — ABNORMAL LOW
Alkaline Phosphatase: 79
BUN: 15
Calcium: 8.3 — ABNORMAL LOW
Chloride: 108
Creatinine, Ser: 0.82
Creatinine, Ser: 1.02
GFR calc Af Amer: >60
Glucose, Bld: 88
Potassium: 4.4
Total Bilirubin: 0.5
Total Bilirubin: 1.4 — ABNORMAL HIGH
Total Protein: 7.6

## 2010-10-27 LAB — BASIC METABOLIC PANEL
BUN: 14
BUN: 2 — ABNORMAL LOW
BUN: 2 — ABNORMAL LOW
BUN: 3 — ABNORMAL LOW
BUN: 3 — ABNORMAL LOW
BUN: 4 — ABNORMAL LOW
BUN: 5 — ABNORMAL LOW
BUN: 6
BUN: 6
BUN: 6
CO2: 21
CO2: 22
CO2: 23
CO2: 23
CO2: 23
CO2: 23
CO2: 24
CO2: 24
CO2: 24
CO2: 25
Calcium: 7.7 — ABNORMAL LOW
Calcium: 8.2 — ABNORMAL LOW
Calcium: 8.2 — ABNORMAL LOW
Calcium: 8.2 — ABNORMAL LOW
Calcium: 8.2 — ABNORMAL LOW
Calcium: 8.2 — ABNORMAL LOW
Calcium: 8.3 — ABNORMAL LOW
Calcium: 8.3 — ABNORMAL LOW
Calcium: 8.4
Calcium: 8.5
Chloride: 104
Chloride: 105
Chloride: 105
Chloride: 106
Chloride: 106
Chloride: 108
Chloride: 108
Chloride: 108
Creatinine, Ser: 0.67
Creatinine, Ser: 0.71
Creatinine, Ser: 0.73
Creatinine, Ser: 0.79
Creatinine, Ser: 0.81
Creatinine, Ser: 0.82
Creatinine, Ser: 0.84
Creatinine, Ser: 1.08
GFR calc Af Amer: >60
GFR calc Af Amer: >60
GFR calc Af Amer: >60
GFR calc Af Amer: >60
GFR calc Af Amer: >60
GFR calc Af Amer: >60
GFR calc non Af Amer: >60
GFR calc non Af Amer: >60
GFR calc non Af Amer: >60
GFR calc non Af Amer: >60
GFR calc non Af Amer: >60
GFR calc non Af Amer: >60
GFR calc non Af Amer: >60
Glucose, Bld: 100 — ABNORMAL HIGH
Glucose, Bld: 107 — ABNORMAL HIGH
Glucose, Bld: 84
Glucose, Bld: 88
Glucose, Bld: 90
Glucose, Bld: 91
Glucose, Bld: 95
Glucose, Bld: 98
Potassium: 2.6 — CL
Potassium: 2.9 — ABNORMAL LOW
Potassium: 3 — ABNORMAL LOW
Potassium: 3 — ABNORMAL LOW
Potassium: 3.4 — ABNORMAL LOW
Potassium: 3.5
Potassium: 4.1
Sodium: 136
Sodium: 136
Sodium: 136
Sodium: 137
Sodium: 137
Sodium: 138
Sodium: 139

## 2010-10-27 LAB — FACTOR 5 LEIDEN

## 2010-10-27 LAB — MAGNESIUM
Magnesium: 1.7
Magnesium: 1.8
Magnesium: 2.1

## 2010-10-27 LAB — PHOSPHORUS
Phosphorus: 2.9
Phosphorus: 3.3
Phosphorus: 3.6

## 2010-10-27 LAB — URINE MICROSCOPIC-ADD ON

## 2010-10-27 LAB — ANTIPHOSPHOLIPID SYNDROME EVAL, BLD
Anticardiolipin IgA: 10 — ABNORMAL LOW (ref <13)
Antiphosphatidylserine IgG: <11 GPS U/mL (ref <11.0)
Antiphosphatidylserine IgM: <25 MPS U/mL (ref <25.0)
DRVVT: 56.5 — ABNORMAL HIGH (ref 36.1–47.0)
PTT Lupus Anticoagulant: 87.7 — ABNORMAL HIGH (ref 36.3–48.8)
PTTLA Confirmation: 1.5 (ref <8.0)

## 2010-10-27 LAB — POCT CARDIAC MARKERS
CKMB, poc: <1 — ABNORMAL LOW
CKMB, poc: <1 — ABNORMAL LOW
Myoglobin, poc: 81.1
Troponin i, poc: <0.05
Troponin i, poc: <0.05

## 2010-10-27 LAB — PROTIME-INR
INR: 2 — ABNORMAL HIGH
INR: 2.3 — ABNORMAL HIGH
INR: 3 — ABNORMAL HIGH
Prothrombin Time: 15.1
Prothrombin Time: 16.9 — ABNORMAL HIGH
Prothrombin Time: 20 — ABNORMAL HIGH
Prothrombin Time: 23 — ABNORMAL HIGH
Prothrombin Time: 26.2 — ABNORMAL HIGH
Prothrombin Time: 29.3 — ABNORMAL HIGH
Prothrombin Time: 29.6 — ABNORMAL HIGH
Prothrombin Time: 32.6 — ABNORMAL HIGH
Prothrombin Time: 38 — ABNORMAL HIGH
Prothrombin Time: 42.8 — ABNORMAL HIGH

## 2010-10-27 LAB — URINALYSIS, ROUTINE W REFLEX MICROSCOPIC
Glucose, UA: NEGATIVE
Ketones, ur: 15 — AB
Leukocytes, UA: NEGATIVE
Nitrite: NEGATIVE
Nitrite: POSITIVE — AB
Protein, ur: 100 — AB
Specific Gravity, Urine: 1.029
Urobilinogen, UA: 1
Urobilinogen, UA: 2 — ABNORMAL HIGH
pH: 5.5

## 2010-10-27 LAB — LIPID PANEL
HDL: 28 — ABNORMAL LOW
Triglycerides: 102
VLDL: 20

## 2010-10-27 LAB — CULTURE, BLOOD (ROUTINE X 2): Culture: NO GROWTH

## 2010-10-27 LAB — URINE CULTURE
Colony Count: 30000
Culture: NO GROWTH

## 2010-10-27 LAB — DIFFERENTIAL
Eosinophils Relative: 1
Lymphocytes Relative: 14
Lymphs Abs: 1.3
Monocytes Absolute: 1 — ABNORMAL HIGH

## 2010-10-27 LAB — HOMOCYSTEINE: Homocysteine: 15.5 — ABNORMAL HIGH

## 2010-10-27 LAB — I-STAT 8, (EC8 V) (CONVERTED LAB)
Acid-base deficit: 2
Bicarbonate: 22
HCT: 52
Operator id: 272551
TCO2: 23
pCO2, Ven: 35.5 — ABNORMAL LOW

## 2010-10-27 LAB — APTT: aPTT: 32

## 2010-10-27 LAB — HEPARIN LEVEL (UNFRACTIONATED)
Heparin Unfractionated: 0.37
Heparin Unfractionated: 0.42

## 2010-10-27 LAB — OCCULT BLOOD X 1 CARD TO LAB, STOOL: Fecal Occult Bld: NEGATIVE

## 2010-10-27 LAB — HEPATIC FUNCTION PANEL
ALT: 10
AST: 14
Indirect Bilirubin: 0.9
Total Protein: 7.7

## 2010-10-27 LAB — B-NATRIURETIC PEPTIDE (CONVERTED LAB): Pro B Natriuretic peptide (BNP): 313 — ABNORMAL HIGH

## 2010-10-27 LAB — POCT I-STAT CREATININE: Creatinine, Ser: 0.9

## 2011-04-13 DIAGNOSIS — S7010XA Contusion of unspecified thigh, initial encounter: Secondary | ICD-10-CM

## 2011-04-13 DIAGNOSIS — K922 Gastrointestinal hemorrhage, unspecified: Secondary | ICD-10-CM

## 2011-04-13 HISTORY — DX: Gastrointestinal hemorrhage, unspecified: K92.2

## 2011-04-13 HISTORY — DX: Contusion of unspecified thigh, initial encounter: S70.10XA

## 2011-04-16 ENCOUNTER — Ambulatory Visit
Admission: RE | Admit: 2011-04-16 | Discharge: 2011-04-16 | Disposition: A | Discharge status: Home / Self Care | Payer: No Typology Code available for payment source | Source: Ambulatory Visit | Attending: Family Medicine | Admitting: Family Medicine

## 2011-04-16 ENCOUNTER — Other Ambulatory Visit: Payer: Self-pay | Admitting: Family Medicine

## 2011-04-16 DIAGNOSIS — Z0000 Encounter for general adult medical examination without abnormal findings: Secondary | ICD-10-CM

## 2011-05-01 ENCOUNTER — Other Ambulatory Visit: Payer: Self-pay | Admitting: Family Medicine

## 2011-05-01 ENCOUNTER — Ambulatory Visit
Admission: RE | Admit: 2011-05-01 | Discharge: 2011-05-01 | Disposition: A | Discharge status: Home / Self Care | Payer: Medicare Other | Source: Ambulatory Visit | Attending: Family Medicine | Admitting: Family Medicine

## 2011-05-01 DIAGNOSIS — R52 Pain, unspecified: Secondary | ICD-10-CM

## 2011-05-07 ENCOUNTER — Emergency Department (HOSPITAL_COMMUNITY): Payer: Medicare Other

## 2011-05-07 ENCOUNTER — Inpatient Hospital Stay (HOSPITAL_COMMUNITY)
Admission: EM | Admit: 2011-05-07 | Discharge: 2011-05-15 | DRG: 516 | Disposition: A | Discharge status: Skilled Nursing Facility | Payer: Medicare Other | Attending: Internal Medicine | Admitting: Internal Medicine

## 2011-05-07 ENCOUNTER — Encounter (HOSPITAL_COMMUNITY): Payer: Self-pay | Admitting: Emergency Medicine

## 2011-05-07 ENCOUNTER — Other Ambulatory Visit: Payer: Self-pay

## 2011-05-07 DIAGNOSIS — D62 Acute posthemorrhagic anemia: Secondary | ICD-10-CM | POA: Diagnosis present

## 2011-05-07 DIAGNOSIS — L409 Psoriasis, unspecified: Secondary | ICD-10-CM | POA: Diagnosis present

## 2011-05-07 DIAGNOSIS — R791 Abnormal coagulation profile: Secondary | ICD-10-CM

## 2011-05-07 DIAGNOSIS — Z79899 Other long term (current) drug therapy: Secondary | ICD-10-CM

## 2011-05-07 DIAGNOSIS — I4892 Unspecified atrial flutter: Secondary | ICD-10-CM | POA: Diagnosis not present

## 2011-05-07 DIAGNOSIS — I2699 Other pulmonary embolism without acute cor pulmonale: Secondary | ICD-10-CM | POA: Diagnosis not present

## 2011-05-07 DIAGNOSIS — T45515A Adverse effect of anticoagulants, initial encounter: Secondary | ICD-10-CM | POA: Diagnosis present

## 2011-05-07 DIAGNOSIS — Z9989 Dependence on other enabling machines and devices: Secondary | ICD-10-CM | POA: Diagnosis present

## 2011-05-07 DIAGNOSIS — L408 Other psoriasis: Secondary | ICD-10-CM | POA: Diagnosis present

## 2011-05-07 DIAGNOSIS — Z7901 Long term (current) use of anticoagulants: Secondary | ICD-10-CM

## 2011-05-07 DIAGNOSIS — G4733 Obstructive sleep apnea (adult) (pediatric): Secondary | ICD-10-CM | POA: Diagnosis present

## 2011-05-07 DIAGNOSIS — E785 Hyperlipidemia, unspecified: Secondary | ICD-10-CM | POA: Diagnosis present

## 2011-05-07 DIAGNOSIS — M7981 Nontraumatic hematoma of soft tissue: Principal | ICD-10-CM | POA: Diagnosis present

## 2011-05-07 DIAGNOSIS — K5730 Diverticulosis of large intestine without perforation or abscess without bleeding: Secondary | ICD-10-CM | POA: Diagnosis present

## 2011-05-07 DIAGNOSIS — M79604 Pain in right leg: Secondary | ICD-10-CM

## 2011-05-07 DIAGNOSIS — E876 Hypokalemia: Secondary | ICD-10-CM | POA: Diagnosis present

## 2011-05-07 DIAGNOSIS — I739 Peripheral vascular disease, unspecified: Secondary | ICD-10-CM | POA: Diagnosis present

## 2011-05-07 DIAGNOSIS — K921 Melena: Secondary | ICD-10-CM | POA: Diagnosis present

## 2011-05-07 DIAGNOSIS — I10 Essential (primary) hypertension: Secondary | ICD-10-CM | POA: Diagnosis present

## 2011-05-07 DIAGNOSIS — K648 Other hemorrhoids: Secondary | ICD-10-CM | POA: Diagnosis present

## 2011-05-07 DIAGNOSIS — D126 Benign neoplasm of colon, unspecified: Secondary | ICD-10-CM | POA: Diagnosis present

## 2011-05-07 DIAGNOSIS — I4891 Unspecified atrial fibrillation: Secondary | ICD-10-CM | POA: Diagnosis present

## 2011-05-07 DIAGNOSIS — Z86711 Personal history of pulmonary embolism: Secondary | ICD-10-CM

## 2011-05-07 DIAGNOSIS — Z86718 Personal history of other venous thrombosis and embolism: Secondary | ICD-10-CM

## 2011-05-07 DIAGNOSIS — K644 Residual hemorrhoidal skin tags: Secondary | ICD-10-CM | POA: Diagnosis present

## 2011-05-07 DIAGNOSIS — I723 Aneurysm of iliac artery: Secondary | ICD-10-CM | POA: Diagnosis present

## 2011-05-07 DIAGNOSIS — D649 Anemia, unspecified: Secondary | ICD-10-CM | POA: Diagnosis present

## 2011-05-07 DIAGNOSIS — R200 Anesthesia of skin: Secondary | ICD-10-CM | POA: Diagnosis present

## 2011-05-07 DIAGNOSIS — G8929 Other chronic pain: Secondary | ICD-10-CM | POA: Diagnosis present

## 2011-05-07 HISTORY — DX: Other chronic pain: G89.29

## 2011-05-07 HISTORY — DX: Essential (primary) hypertension: I10

## 2011-05-07 LAB — RETICULOCYTES: Retic Count, Absolute: 125.4 10*3/uL (ref 19.0–186.0)

## 2011-05-07 LAB — IRON AND TIBC: Iron: 21 ug/dL — ABNORMAL LOW (ref 42–135)

## 2011-05-07 LAB — PREPARE RBC (CROSSMATCH)

## 2011-05-07 LAB — OCCULT BLOOD, POC DEVICE: Fecal Occult Bld: NEGATIVE

## 2011-05-07 LAB — POCT I-STAT, CHEM 8
Calcium, Ion: 1.11 mmol/L — ABNORMAL LOW (ref 1.12–1.32)
HCT: 22 % — ABNORMAL LOW (ref 39.0–52.0)
Sodium: 140 mEq/L (ref 135–145)
TCO2: 22 mmol/L (ref 0–100)

## 2011-05-07 LAB — PROTIME-INR
INR: >10 (ref 0.00–1.49)
Prothrombin Time: >90 s — ABNORMAL HIGH (ref 11.6–15.2)

## 2011-05-07 LAB — MAGNESIUM: Magnesium: 2 mg/dL (ref 1.5–2.5)

## 2011-05-07 LAB — CBC
HCT: 21.5 % — ABNORMAL LOW (ref 39.0–52.0)
Hemoglobin: 7 g/dL — ABNORMAL LOW (ref 13.0–17.0)
MCH: 29.5 pg (ref 26.0–34.0)
MCHC: 32.6 g/dL (ref 30.0–36.0)

## 2011-05-07 LAB — FOLATE: Folate: 9.2 ng/mL

## 2011-05-07 MED ORDER — MORPHINE SULFATE 2 MG/ML IJ SOLN
4.0000 mg | INTRAMUSCULAR | Status: DC | PRN
  Filled 2011-05-07: qty 2

## 2011-05-07 MED ORDER — OXYCODONE-ACETAMINOPHEN 5-325 MG PO TABS
1.0000 | ORAL_TABLET | ORAL | Status: DC | PRN
  Administered 2011-05-07 – 2011-05-14 (×8): 1 via ORAL
  Filled 2011-05-07 (×9): qty 1

## 2011-05-07 MED ORDER — ATORVASTATIN CALCIUM 10 MG PO TABS
10.0000 mg | ORAL_TABLET | Freq: Every day | ORAL | Status: DC
  Administered 2011-05-07 – 2011-05-15 (×9): 10 mg via ORAL
  Filled 2011-05-07 (×9): qty 1

## 2011-05-07 MED ORDER — TRIAMCINOLONE ACETONIDE 0.1 % EX OINT
1.0000 "application " | TOPICAL_OINTMENT | Freq: Every day | CUTANEOUS | Status: DC
  Administered 2011-05-07 – 2011-05-12 (×5): 1 via TOPICAL
  Filled 2011-05-07 (×2): qty 15

## 2011-05-07 MED ORDER — ACETAMINOPHEN 325 MG PO TABS: 650.0000 mg | ORAL_TABLET | Freq: Four times a day (QID) | ORAL | Status: DC | PRN

## 2011-05-07 MED ORDER — SODIUM CHLORIDE 0.9 % IJ SOLN
3.0000 mL | Freq: Two times a day (BID) | INTRAMUSCULAR | Status: DC
  Administered 2011-05-07 – 2011-05-09 (×3): 3 mL via INTRAVENOUS
  Administered 2011-05-10: 22:00:00 via INTRAVENOUS
  Administered 2011-05-13 – 2011-05-15 (×3): 3 mL via INTRAVENOUS

## 2011-05-07 MED ORDER — SODIUM CHLORIDE 0.9 % IJ SOLN: 3.0000 mL | INTRAMUSCULAR | Status: DC | PRN

## 2011-05-07 MED ORDER — ONDANSETRON HCL 4 MG/2ML IJ SOLN: 4.0000 mg | Freq: Four times a day (QID) | INTRAMUSCULAR | Status: DC | PRN

## 2011-05-07 MED ORDER — OXYCODONE-ACETAMINOPHEN 10-325 MG PO TABS: 1.0000 | ORAL_TABLET | ORAL | Status: DC | PRN

## 2011-05-07 MED ORDER — HYDROMORPHONE HCL PF 1 MG/ML IJ SOLN
1.0000 mg | Freq: Once | INTRAMUSCULAR | Status: AC
  Administered 2011-05-07: 1 mg via INTRAVENOUS
  Filled 2011-05-07: qty 1

## 2011-05-07 MED ORDER — SIMVASTATIN 40 MG PO TABS
40.0000 mg | ORAL_TABLET | Freq: Every day | ORAL | Status: DC
  Filled 2011-05-07: qty 1

## 2011-05-07 MED ORDER — OXYCODONE HCL 5 MG PO TABS
5.0000 mg | ORAL_TABLET | ORAL | Status: DC | PRN
  Administered 2011-05-07 – 2011-05-08 (×2): 5 mg via ORAL
  Filled 2011-05-07 (×3): qty 1

## 2011-05-07 MED ORDER — DIGOXIN 125 MCG PO TABS
125.0000 ug | ORAL_TABLET | Freq: Every day | ORAL | Status: DC
  Administered 2011-05-07 – 2011-05-10 (×4): 125 ug via ORAL
  Filled 2011-05-07 (×4): qty 1

## 2011-05-07 MED ORDER — FLUOCINONIDE 0.05 % EX OINT
1.0000 "application " | TOPICAL_OINTMENT | Freq: Two times a day (BID) | CUTANEOUS | Status: DC
  Administered 2011-05-07 – 2011-05-14 (×9): 1 via TOPICAL
  Filled 2011-05-07 (×4): qty 15

## 2011-05-07 MED ORDER — ONDANSETRON HCL 4 MG/2ML IJ SOLN
4.0000 mg | Freq: Once | INTRAMUSCULAR | Status: AC
  Administered 2011-05-07: 4 mg via INTRAVENOUS
  Filled 2011-05-07: qty 2

## 2011-05-07 MED ORDER — IOHEXOL 300 MG/ML  SOLN
100.0000 mL | Freq: Once | INTRAMUSCULAR | Status: AC | PRN
  Administered 2011-05-07: 100 mL via INTRAVENOUS

## 2011-05-07 MED ORDER — SODIUM CHLORIDE 0.9 % IV SOLN: 250.0000 mL | INTRAVENOUS | Status: DC | PRN

## 2011-05-07 MED ORDER — VITAMIN K1 10 MG/ML IJ SOLN
5.0000 mg | Freq: Once | INTRAMUSCULAR | Status: AC
  Administered 2011-05-07: 5 mg via INTRAVENOUS
  Filled 2011-05-07: qty 0.5

## 2011-05-07 MED ORDER — ONDANSETRON HCL 4 MG PO TABS: 4.0000 mg | ORAL_TABLET | Freq: Four times a day (QID) | ORAL | Status: DC | PRN

## 2011-05-07 MED ORDER — POTASSIUM CHLORIDE CRYS ER 20 MEQ PO TBCR
40.0000 meq | EXTENDED_RELEASE_TABLET | Freq: Two times a day (BID) | ORAL | Status: AC
  Administered 2011-05-07 (×2): 40 meq via ORAL
  Filled 2011-05-07: qty 2
  Filled 2011-05-07 (×2): qty 1

## 2011-05-07 MED ORDER — HYDROCORTISONE 1 % EX CREA
TOPICAL_CREAM | Freq: Two times a day (BID) | CUTANEOUS | Status: DC
  Administered 2011-05-07 – 2011-05-08 (×3): via TOPICAL
  Administered 2011-05-09: 1 via TOPICAL
  Administered 2011-05-10 – 2011-05-12 (×6): via TOPICAL
  Filled 2011-05-07 (×2): qty 28

## 2011-05-07 MED ORDER — ACETAMINOPHEN 650 MG RE SUPP: 650.0000 mg | Freq: Four times a day (QID) | RECTAL | Status: DC | PRN

## 2011-05-07 MED ORDER — DILTIAZEM HCL ER COATED BEADS 360 MG PO CP24
360.0000 mg | ORAL_CAPSULE | Freq: Every day | ORAL | Status: DC
  Administered 2011-05-07: 360 mg via ORAL
  Filled 2011-05-07 (×2): qty 1

## 2011-05-07 NOTE — ED Provider Notes (Signed)
History     CSN: [id number]  Arrival date & time 05/07/11  0450   First MD Initiated Contact with Patient 05/07/11 0530      Chief Complaint  Patient presents with  . Numbness    (Consider location/radiation/quality/duration/timing/severity/associated sxs/prior treatment) HPI History provided by patient. Right leg pain and numbness since last night patient went to bed with the symptoms and woke up with more severe symptoms. Onset unknown. He also has associated shortness of breath, takes Coumadin for history of PE/ DVT, and states he has had black stools the last 24-48 hours. No bright red blood per rectum. No bleeding otherwise. No chest pains abdominal pains. No trauma. Mod in severity. Poor historian.  Past Medical History  Diagnosis Date  . Hypertension   . Chronic pain   . Obesity   . DVT (deep venous thrombosis)     History reviewed. No pertinent past surgical history.  No family history on file.  History  Substance Use Topics  . Smoking status: Never Smoker   . Smokeless tobacco: Not on file  . Alcohol Use: Yes      Review of Systems  Constitutional: Negative for fever and chills.  HENT: Negative for neck pain and neck stiffness.   Eyes: Negative for pain.  Respiratory: Negative for shortness of breath.   Cardiovascular: Negative for chest pain.  Gastrointestinal: Negative for nausea, vomiting, abdominal pain and constipation.  Genitourinary: Negative for dysuria.  Musculoskeletal: Negative for back pain.  Skin: Negative for rash.  Neurological: Positive for numbness. Negative for syncope and headaches.  All other systems reviewed and are negative.    Allergies  Review of patient's allergies indicates no known allergies.  Home Medications   Current Outpatient Rx  Name Route Sig Dispense Refill  . DESONIDE 0.05 % EX CREA Topical Apply 1 application topically daily. Apply to rash on face    . DIGOXIN 0.125 MG PO TABS Oral Take 125 mcg by mouth daily.     [name] DILTIAZEM HCL ER COATED BEADS 360 MG PO CP24 Oral Take 360 mg by mouth daily.    [name] FLUOCINONIDE 0.05 % EX OINT Topical Apply 1 application topically 2 (two) times daily. Apply to dry skin areas on body    . OXYCODONE-ACETAMINOPHEN 10-325 MG PO TABS Oral Take 1 tablet by mouth every 4 (four) hours as needed. pain    . PRAVASTATIN SODIUM 40 MG PO TABS Oral Take 40 mg by mouth daily.    . TRIAMCINOLONE ACETONIDE 0.1 % EX OINT Topical Apply 1 application topically daily. Apply to skin rash areas on body    . WARFARIN SODIUM 5 MG PO TABS Oral Take 15 mg by mouth daily.      BP 127/63  Pulse 68  Temp(Src) 97.8 F (36.6 C) (Oral)  Resp 18  SpO2 98%  Physical Exam  Constitutional: He is oriented to person, place, and time. He appears well-developed and well-nourished.  HENT:  Head: Normocephalic and atraumatic.  Eyes: Conjunctivae and EOM are normal. Pupils are equal, round, and reactive to light.  Neck: Trachea normal. Neck supple. No thyromegaly present.  Cardiovascular: Normal rate, regular rhythm, S1 normal, S2 normal and normal pulses.     No systolic murmur is present   No diastolic murmur is present  Pulses:      Radial pulses are 2+ on the right side, and 2+ on the left side.  Pulmonary/Chest: Effort normal and breath sounds normal. He has no wheezes. He has no  rhonchi. He has no rales. He exhibits no tenderness.  Abdominal: Soft. Normal appearance and bowel sounds are normal. There is no tenderness. There is no CVA tenderness and negative Murphy's sign.  Genitourinary:       Nontender no masses. Minimal stool in vault without blood  Musculoskeletal:       Right lower extremity: Localizes discomfort to anterior thigh without reproducible tenderness. Decreased range of motion patient states secondary to pain. Dorsi plantar flexion equal and intact. Sensorium to light touch equal and intact throughout.  Neurological: He is alert and oriented to person, place, and time. He has  normal strength. No cranial nerve deficit or sensory deficit. GCS eye subscore is 4. GCS verbal subscore is 5. GCS motor subscore is 6.  Skin: Skin is warm and dry. No rash noted. He is not diaphoretic.       Changes of psoriasis throughout. No increased warmth or fluctuance to suggest cellulitis/ abscess.   Psychiatric: His speech is normal.       Cooperative and appropriate    ED Course  Procedures (including critical care time)  Labs Reviewed  CBC - Abnormal; Notable for the following:    RBC 2.37 (*)    Hemoglobin 7.0 (*)    HCT 21.5 (*)    RDW 20.4 (*)    All other components within normal limits  POCT I-STAT, CHEM 8 - Abnormal; Notable for the following:    Potassium 3.4 (*)    Glucose, Bld 119 (*)    Calcium, Ion 1.11 (*)    Hemoglobin 7.5 (*)    HCT 22.0 (*)    All other components within normal limits  PROTIME-INR - Abnormal; Notable for the following:    Prothrombin Time >90.0 (*) REPEATED TO VERIFY   INR >10.00 (*)    All other components within normal limits  OCCULT BLOOD, POC DEVICE  TYPE AND SCREEN  PREPARE FRESH FROZEN PLASMA   Dg Chest 2 View  05/07/2011  *RADIOLOGY REPORT*  Clinical Data: Right thigh pain since 10:00 a.m.  No known injury. Shortness of breath.  Numbness.  CHEST - 2 VIEW  Comparison: 04/16/2011  Findings: Shallow inspiration.  Mild cardiac enlargement.  Normal pulmonary vascularity.  No focal airspace consolidation or edema. No blunting of the costophrenic angles.  No pneumothorax. Degenerative changes in the thoracic spine.  No significant change since previous study.  IMPRESSION: No evidence of active pulmonary disease.  Original Report Authenticated By: Marlon Pel, M.D.   Ct Head Wo Contrast  05/07/2011  *RADIOLOGY REPORT*  Clinical Data: Right leg numbness. Light bothers eyes.  CT HEAD WITHOUT CONTRAST  Technique:  Contiguous axial images were obtained from the base of the skull through the vertex without contrast.  Comparison: None.   Findings: The ventricles and sulci appear symmetrical.  No mass effect or midline shift.  No abnormal extra-axial fluid collections.  Vague focal area of low attenuation in the left parietal white matter posterior to the sylvian fissure.  This may represent partial voluming with the posterior horn of the left lateral ventricle but a focal area of early ischemia can also have this appearance.  No evidence of acute intracranial hemorrhage. Gray-white matter junctions are distinct.  Basal cisterns are not effaced.  No depressed skull fractures.  Vascular calcifications. Visualized paranasal sinuses and mastoid air cells are not opacified.  Old nasal bone fracture.  IMPRESSION: Nonspecific low attenuation area in the left posterior parietal white matter which could represent early ischemic  focus versus partial voluming.  No significant mass effect.  No acute intracranial hemorrhage.  Original Report Authenticated By: Marlon Pel, M.D.    Date: 05/07/2011  Rate: 88  Rhythm: normal sinus rhythm  QRS Axis: normal  Intervals: normal  ST/T Wave abnormalities: nonspecific ST changes  Conduction Disutrbances:none  Narrative Interpretation:   Old EKG Reviewed: unchanged    MDM   Right thigh pain h/o DVT, complicated presentation with elevated INR, anemia and mild SOB likely symptomatic anemia. Vit K and FFP initiated with MED c/s for admit.   Condition does not suggest stroke. Chief complaint is pain and does have associated numbness. CT brain reviewed as above no intracranial hemorrhage.    MED consult, d/w Dr Radonna Ricker at [phone] - plan admit with transfuse now and image RLE to further evaluate.       Sunnie Nielsen, MD 05/07/11 [phone]

## 2011-05-07 NOTE — ED Notes (Signed)
PT. REPORTS WOKE UP AT AT 2 AM THIS MORNING WITH RIGHT LEG NUMBNESS AND SLIGHT SOB . DENIES COUGH OR CONGESTION .

## 2011-05-07 NOTE — H&P (Signed)
Hospital Admission Note Date: 05/07/2011  PCP: No primary provider on file.  Chief Complaint: leg numbness     History of Present Illness: This is a [age] year old male with past medical history of left lower extremity infected hematoma status post incision and drainage in 2008 on Coumadin for a massive right pulmonary emboli followed by his PCP, also hypertension that comes in for right leg numbness. UA E. woke up at 2 AM in his right leg was numb and  thought it was getting out. So he called his neighbor he was brought here to the ED. Here in the ED he is complaining of leg numbness keeps giving out. He relates is not weak but is just slightly numb the lateral aspect of the thigh. He relates no recent falls no dizziness, chest pain or shortness of breath. He does relate a large melanotic stool yesterday. No further episodes of bleeding. Her hemoglobin here in the ED shows a 7.4 fasting glucose 13 this was back in 2008. He had guaiac stool here in the ED which was negative.  He relates he saw his primary care doctor about 5 days prior to admission. Regimen he was bleeding from his mouth. He told him he had something while with this tooth.  Allergies: Review of patient's allergies indicates no known allergies. Past Medical History  Diagnosis Date  . Hypertension   . Chronic pain   . Obesity   . DVT (deep venous thrombosis)    Prior to Admission medications   Medication Sig Start Date End Date Taking? Authorizing Provider  desonide (DESOWEN) 0.05 % cream Apply 1 application topically daily. Apply to rash on face   Yes Historical Provider, MD  digoxin (LANOXIN) 0.125 MG tablet Take 125 mcg by mouth daily.   Yes Historical Provider, MD  diltiazem (CARDIZEM CD) 360 MG 24 hr capsule Take 360 mg by mouth daily.   Yes Historical Provider, MD  fluocinonide ointment (LIDEX) 0.05 % Apply 1 application topically 2 (two) times daily. Apply to dry skin areas on body   Yes Historical Provider, MD    oxyCODONE-acetaminophen (PERCOCET) 10-325 MG per tablet Take 1 tablet by mouth every 4 (four) hours as needed. pain   Yes Historical Provider, MD  pravastatin (PRAVACHOL) 40 MG tablet Take 40 mg by mouth daily.   Yes Historical Provider, MD  triamcinolone ointment (KENALOG) 0.1 % Apply 1 application topically daily. Apply to skin rash areas on body   Yes Historical Provider, MD  warfarin (COUMADIN) 5 MG tablet Take 15 mg by mouth daily.   Yes Historical Provider, MD   History reviewed. No pertinent past surgical history. Family History  Problem Relation Age of Onset  . Cancer Mother    History   Social History  . Marital Status: Divorced    Spouse Name: N/A    Number of Children: N/A  . Years of Education: N/A   Occupational History  . Not on file.   Social History Main Topics  . Smoking status: Former Smoker -- 0.5 packs/day for 2 years  . Smokeless tobacco: Not on file  . Alcohol Use: Yes  . Drug Use: No  . Sexually Active: Yes   Other Topics Concern  . Not on file   Social History Narrative  . No narrative on file    REVIEW OF SYSTEMS:  Constitutional:  No weight loss, night sweats, Fevers, chills, fatigue.  HEENT:  No headaches, Difficulty swallowing,Tooth/dental problems,Sore throat,  No sneezing, itching, ear ache, nasal  congestion, post nasal drip,  Cardio-vascular:  No chest pain, Orthopnea, PND, swelling in lower extremities, anasarca, dizziness, palpitations  GI:  No heartburn, indigestion, abdominal pain, nausea, vomiting, diarrhea, change in bowel habits, loss of appetite  Resp:  No shortness of breath with exertion or at rest. No excess mucus, no productive cough, No non-productive cough, No coughing up of blood.No change in color of mucus.No wheezing.No chest wall deformity  Skin:  no rash or lesions.  GU:  no dysuria, change in color of urine, no urgency or frequency. No flank pain.  Musculoskeletal:  No joint pain or swelling. No decreased range  of motion. No back pain.  Psych:  No change in mood or affect. No depression or anxiety. No memory loss.   Physical Exam: Filed Vitals:   05/07/11 0731 05/07/11 0923 05/07/11 0935 05/07/11 0959  BP: 127/63 122/55 123/65 119/65  Pulse: 68 79 81 76  Temp:   98 F (36.7 C) 98.5 F (36.9 C)  TempSrc:   Oral Oral  Resp: 18 18 18 20   SpO2: 98% 97%     No intake or output data in the 24 hours ending 05/07/11 1006 BP 119/65  Pulse 76  Temp(Src) 98.5 F (36.9 C) (Oral)  Resp 20  SpO2 97%  General Appearance:    Alert, cooperative, no distress, appears stated age  Head:    Normocephalic, without obvious abnormality, atraumatic  Eyes:    PERRL, conjunctiva/corneas clear, EOM's intact, fundi    benign, both eyes       Ears:    Normal TM's and external ear canals, both ears  Nose:   Nares normal, septum midline, mucosa normal, no drainage    or sinus tenderness  Throat:   Lips, mucosa, and tongue normal; teeth and gums normal, he has some bleeding in the left side of his mouth.   Neck:   Supple, symmetrical, trachea midline, no adenopathy;       thyroid:  No enlargement/tenderness/nodules; no carotid   bruit or JVD  Back:     Symmetric, no curvature, ROM normal, no CVA tenderness  Lungs:     Clear to auscultation bilaterally, respirations unlabored  Chest wall:    No tenderness or deformity  Heart:    Regular rate and rhythm, S1 and S2 normal, no murmur, rub   or gallop  Abdomen:     Soft, non-tender, bowel sounds active all four quadrants,    no masses, no organomegaly        Extremities:   Extremities normal, atraumatic, no cyanosis or edema  Pulses:   2+ and symmetric all extremities  Skin:  psoriasis plaque throughout his body   Lymph nodes:  nonpalpable   Neurologic:   CNII-XII intact. Normal strength, sensation and reflexes      throughout   Lab results:  Basename 05/07/11 0534  NA 140  K 3.4*  CL 106  CO2 --  GLUCOSE 119*  BUN 20  CREATININE 1.10  CALCIUM --  MG  --  PHOS --   No results found for this basename: AST:2,ALT:2,ALKPHOS:2,BILITOT:2,PROT:2,ALBUMIN:2 in the last 72 hours No results found for this basename: LIPASE:2,AMYLASE:2 in the last 72 hours  Basename 05/07/11 0534 05/07/11 0522  WBC -- 9.6  NEUTROABS -- --  HGB 7.5* 7.0*  HCT 22.0* 21.5*  MCV -- 90.7  PLT -- 336   No results found for this basename: CKTOTAL:3,CKMB:3,CKMBINDEX:3,TROPONINI:3 in the last 72 hours No components found with this basename: POCBNP:3 No results  found for this basename: DDIMER:2 in the last 72 hours No results found for this basename: HGBA1C:2 in the last 72 hours No results found for this basename: CHOL:2,HDL:2,LDLCALC:2,TRIG:2,CHOLHDL:2,LDLDIRECT:2 in the last 72 hours No results found for this basename: TSH,T4TOTAL,FREET3,T3FREE,THYROIDAB in the last 72 hours No results found for this basename: VITAMINB12:2,FOLATE:2,FERRITIN:2,TIBC:2,IRON:2,RETICCTPCT:2 in the last 72 hours Imaging results:  Dg Chest 1 View  04/16/2011  *RADIOLOGY REPORT*  Clinical Data: [employer] study.  CHEST - 1 VIEW  Comparison: 05/01/2010  Findings: Heart and mediastinal contours are within normal limits. No focal opacities or effusions.  No acute bony abnormality.  IMPRESSION: No active cardiopulmonary disease.  Original Report Authenticated By: Cyndie Chime, M.D.   Dg Chest 2 View  05/07/2011  *RADIOLOGY REPORT*  Clinical Data: Right thigh pain since 10:00 a.m.  No known injury. Shortness of breath.  Numbness.  CHEST - 2 VIEW  Comparison: 04/16/2011  Findings: Shallow inspiration.  Mild cardiac enlargement.  Normal pulmonary vascularity.  No focal airspace consolidation or edema. No blunting of the costophrenic angles.  No pneumothorax. Degenerative changes in the thoracic spine.  No significant change since previous study.  IMPRESSION: No evidence of active pulmonary disease.  Original Report Authenticated By: Marlon Pel, M.D.   Ct Head Wo Contrast  05/07/2011   *RADIOLOGY REPORT*  Clinical Data: Right leg numbness. Light bothers eyes.  CT HEAD WITHOUT CONTRAST  Technique:  Contiguous axial images were obtained from the base of the skull through the vertex without contrast.  Comparison: None.  Findings: The ventricles and sulci appear symmetrical.  No mass effect or midline shift.  No abnormal extra-axial fluid collections.  Vague focal area of low attenuation in the left parietal white matter posterior to the sylvian fissure.  This may represent partial voluming with the posterior horn of the left lateral ventricle but a focal area of early ischemia can also have this appearance.  No evidence of acute intracranial hemorrhage. Gray-white matter junctions are distinct.  Basal cisterns are not effaced.  No depressed skull fractures.  Vascular calcifications. Visualized paranasal sinuses and mastoid air cells are not opacified.  Old nasal bone fracture.  IMPRESSION: Nonspecific low attenuation area in the left posterior parietal white matter which could represent early ischemic focus versus partial voluming.  No significant mass effect.  No acute intracranial hemorrhage.  Original Report Authenticated By: Marlon Pel, M.D.   Ct Femur Right W Contrast  05/07/2011  *RADIOLOGY REPORT*  Clinical Data: Right thigh pain.  History of abscess.  CT OF THE RIGHT LOWER EXTREMITY WITH CONTRAST  Contrast: OMNIPAQUE IOHEXOL 300 MG/ML  SOLN  Comparison: CT abdomen and pelvis 08/20/2006.  Findings: No abscess is identified.  There is infiltration of fat about the iliopsoas muscle belly and the muscle appears mildly edematous.  Ossification of the iliopsoas tendon at the lesser trochanter is compatible with old injury/enthesopathic change. Visualized muscles and tendons otherwise are normal in appearance. Subcutaneous fatty tissues are normal in appearance.  No mass is identified.  No bony destructive change or fracture is seen.  Marked appearing degenerative disease about the  right knee is noted with a small joint effusion is seen. There is a loose body in the posterior aspect of the joint measuring 1.5 cm in diameter.  Imaged intrapelvic contents demonstrate a right external iliac node measuring 1.5 cm short axis dimension on image 8.  IMPRESSION:  1.  Negative for abscess. 2.  Infiltration of fat about the iliopsoas muscle in the proximal  thigh with some edematous change within the muscle belly could be due to infectious or inflammatory change.  Muscle strain could create a similar appearance.  Enthesopathic change of the iliopsoas tendon is again noted. 3.  Small right external iliac lymph node is likely reactive. 4.  Advanced degenerative disease about the right knee.  Original Report Authenticated By: Bernadene Bell. D'ALESSIO, M.D.   Dg Shoulder Left  05/01/2011  *RADIOLOGY REPORT*  Clinical Data: Pain post crushing injury  LEFT SHOULDER - 2+ VIEW  Comparison: None.  Findings: Three views of the left shoulder submitted.  No acute fracture or subluxation.  No radiopaque foreign body.  IMPRESSION: No acute fracture or subluxation.  Original Report Authenticated By: Natasha Mead, M.D.   Dg Humerus Left  05/01/2011  *RADIOLOGY REPORT*  Clinical Data: Injured left arm and shoulder approximately 1 week ago while pulling in self out of a car.  Patient unable to adduct the left arm above chest level.  LEFT HUMERUS - 2+ VIEW 05/01/2011:  Comparison: Left shoulder x-rays obtained concurrently.  Findings: No evidence of acute, subacute, or healed fractures.  No intrinsic osseous abnormalities involving the humerus.  Well- preserved bone mineral density.  Visualized elbow joint intact.  IMPRESSION: Normal examination.  Original Report Authenticated By: Arnell Sieving, M.D.   Other results: EKG:[name]   Patient Active Hospital Problem List: Right leg numbness (05/07/2011) Patient complaining of right-sided numbness on his right lower extremity, he is otherwise nonfocal and physical exam CT  scan was done that showed low-grade, his FOBT was negative. The ED, he did complain of some melanotic stools yesterday. His hemoglobin has dropped more than 4 points concern for retroperitoneal bleed, we'll get a CT scan of the abdomen and pelvis. We'll go ahead and transfuse him 2 units of blood.   Supratherapeutic INR (05/07/2011) Patient has been followed up by his PCP for his Coumadin. His INR is greater than 10. I agree with the ED giving him FFP and vitamin K. As he does relate a history of melanotic stools. He has some mild bleeding in his mouth.we'll check a CBC today. And PT and INR daily.  To be followed up at the Coumadin clinic as an outpatient.   HTN (hypertension) (05/07/2011)  His blood pressure stable continue diltiazem.   Melena (05/07/2011)  is a significant drop in his hemoglobin comparing to the hemoglobin back in 2008. He does complain of some melanotic stool. But his FOBT was negative. We'll go ahead and transfuse him. In continue check a CBC and FOBT.  Anemia (05/07/2011)   unknown source of bleeding. His MCV is 90. His RDW is 20, we'll go ahead and check an anemia panel before transfusing him.  Hypokalemia (05/07/2011)   replete and check a magnesium level.   History of DVT (deep vein thrombosis) (05/07/2011)  his DVT was back in 2008, he has a supratherapeutic INR, he also has a history of paroxysmal A. fib. Is probably why he still Coumadin. I think following up with his primary care doctor for his Coumadin. We'll try to schedule him with a Coumadin clinic.  Paroxysmal A. Fib: Daily rate control. At this time we'll hold his Coumadin. We'll continue to follow his INR daily.  Code Status: Full code Family Communication: Relative [phone]   Marinda Elk M.D. [facility] [phone] 05/07/2011, 10:06 AM

## 2011-05-07 NOTE — ED Notes (Signed)
Attempted to call report, per [name] is unavailable but requested number for call back.

## 2011-05-07 NOTE — ED Notes (Addendum)
Pain increasing, pt restless and writhing in stretcher d/t R thigh pain, denies CP, mentions "has been some sob", pt clammy.

## 2011-05-07 NOTE — Progress Notes (Signed)
Utilization Review Completed.Claressa Hughley T4/25/2013   

## 2011-05-07 NOTE — ED Notes (Signed)
MD at bedside., admitting MD 

## 2011-05-07 NOTE — Progress Notes (Signed)
Called into pts room around 1800; pt c/o of "fluttering" in his chest, BP 128/50, HR in the 90s, 98% RA; pt states that fluttering only lasted a couple of minutes and no longer felt it once I came in room; EKG was obtained, pt found to be in a-flutter, HR 110s; pt placed on tele and MD paged and made aware; currently pt in NSR, HR 80-90s; no new orders received, will continue to monitor

## 2011-05-07 NOTE — ED Notes (Signed)
Labs resulted in EPIC, EDP notified of Hgb 7.0

## 2011-05-07 NOTE — ED Notes (Signed)
Morbidly obese pt. C/o R thigh numbness, describes as pain, numbness & weakness. Sudden onset at 0200. "Not able to stand", difficult to bare weight. Needed assistance in transfer from w/c. Chose a poor stance, an exaggerated wide stance to self transfer with assist. MAEx4. Pedal flexion & extension are equal and strong. Denies fall or injury. Denies fever, nvd, loss of control of bowel or bladder, incontinance, back pain, h/o back or neck problems. Mentions having an appointment with [place] [facility] next week for LUE. Also mentions palpitations

## 2011-05-07 NOTE — ED Notes (Signed)
Pt resting on stretcher, reports no pain relief from previous medication given. Pt reports movement from imaging increased R leg pain that pt describes as "numb" from R thigh to R knee.  Pt denies any injury.  Pt rates pain at 10/10.

## 2011-05-07 NOTE — ED Notes (Signed)
Pain med given, pt to CT

## 2011-05-07 NOTE — ED Notes (Signed)
Pt resting with O2 sat dropping to 87-88% on room air.  O2 applied via nasal cannula.

## 2011-05-07 NOTE — ED Notes (Signed)
EDP into room 

## 2011-05-07 NOTE — ED Notes (Signed)
Dr Dierdre Highman informed of pt's INR and PT values which were called as critical from lab

## 2011-05-08 ENCOUNTER — Inpatient Hospital Stay (HOSPITAL_COMMUNITY): Payer: Medicare Other

## 2011-05-08 ENCOUNTER — Encounter (HOSPITAL_COMMUNITY)
Admission: EM | Disposition: A | Discharge status: Skilled Nursing Facility | Payer: Self-pay | Source: Home / Self Care | Attending: Internal Medicine

## 2011-05-08 ENCOUNTER — Encounter (HOSPITAL_COMMUNITY): Payer: Self-pay | Admitting: Radiology

## 2011-05-08 DIAGNOSIS — I723 Aneurysm of iliac artery: Secondary | ICD-10-CM

## 2011-05-08 DIAGNOSIS — M79609 Pain in unspecified limb: Secondary | ICD-10-CM

## 2011-05-08 HISTORY — PX: ESOPHAGOGASTRODUODENOSCOPY: SHX5428

## 2011-05-08 LAB — PROTIME-INR
INR: 1.77 — ABNORMAL HIGH (ref 0.00–1.49)
Prothrombin Time: 20.9 seconds — ABNORMAL HIGH (ref 11.6–15.2)
Prothrombin Time: 24.4 seconds — ABNORMAL HIGH (ref 11.6–15.2)

## 2011-05-08 LAB — CBC
HCT: 21.9 % — ABNORMAL LOW (ref 39.0–52.0)
MCHC: 33.3 g/dL (ref 30.0–36.0)
RDW: 21.4 % — ABNORMAL HIGH (ref 11.5–15.5)
WBC: 10.3 10*3/uL (ref 4.0–10.5)

## 2011-05-08 LAB — PREPARE FRESH FROZEN PLASMA: Unit division: 0

## 2011-05-08 LAB — HEMOGLOBIN AND HEMATOCRIT, BLOOD
HCT: 23.1 % — ABNORMAL LOW (ref 39.0–52.0)
Hemoglobin: 7.7 g/dL — ABNORMAL LOW (ref 13.0–17.0)

## 2011-05-08 SURGERY — EGD (ESOPHAGOGASTRODUODENOSCOPY)
Anesthesia: Moderate Sedation

## 2011-05-08 MED ORDER — CHLORHEXIDINE GLUCONATE 0.12 % MT SOLN
15.0000 mL | Freq: Two times a day (BID) | OROMUCOSAL | Status: DC
  Administered 2011-05-08 – 2011-05-14 (×9): 15 mL via OROMUCOSAL
  Filled 2011-05-08 (×16): qty 15

## 2011-05-08 MED ORDER — PEG 3350-KCL-NA BICARB-NACL 420 G PO SOLR
4000.0000 mL | Freq: Once | ORAL | Status: AC
  Administered 2011-05-08: 4000 mL via ORAL
  Filled 2011-05-08: qty 4000

## 2011-05-08 MED ORDER — DIPHENHYDRAMINE HCL 50 MG/ML IJ SOLN
INTRAMUSCULAR | Status: AC
  Filled 2011-05-08: qty 1

## 2011-05-08 MED ORDER — DIPHENHYDRAMINE HCL 50 MG/ML IJ SOLN: 25.0000 mg | Freq: Once | INTRAMUSCULAR | Status: DC

## 2011-05-08 MED ORDER — FUROSEMIDE 10 MG/ML IJ SOLN: 20.0000 mg | Freq: Once | INTRAMUSCULAR | Status: DC

## 2011-05-08 MED ORDER — FENTANYL NICU IV SYRINGE 50 MCG/ML
INJECTION | INTRAMUSCULAR | Status: DC | PRN
  Administered 2011-05-08 (×2): 25 ug via INTRAVENOUS

## 2011-05-08 MED ORDER — SODIUM CHLORIDE 0.9 % IV SOLN
INTRAVENOUS | Status: DC
  Administered 2011-05-09: 100 mL via INTRAVENOUS
  Administered 2011-05-09: 21:00:00 via INTRAVENOUS
  Administered 2011-05-10: 1000 mL via INTRAVENOUS
  Administered 2011-05-10: 100 mL via INTRAVENOUS
  Administered 2011-05-12: 1000 mL via INTRAVENOUS
  Administered 2011-05-13: 16:00:00 via INTRAVENOUS

## 2011-05-08 MED ORDER — SODIUM CHLORIDE 0.9 % IV SOLN
250.0000 mL | INTRAVENOUS | Status: DC | PRN
  Administered 2011-05-08: 250 mL via INTRAVENOUS

## 2011-05-08 MED ORDER — ACETAMINOPHEN 325 MG PO TABS: 650.0000 mg | ORAL_TABLET | Freq: Once | ORAL | Status: AC

## 2011-05-08 MED ORDER — FENTANYL CITRATE 0.05 MG/ML IJ SOLN
INTRAMUSCULAR | Status: AC
  Filled 2011-05-08: qty 4

## 2011-05-08 MED ORDER — BIOTENE DRY MOUTH MT LIQD
15.0000 mL | Freq: Two times a day (BID) | OROMUCOSAL | Status: DC
  Administered 2011-05-12 – 2011-05-14 (×4): 15 mL via OROMUCOSAL

## 2011-05-08 MED ORDER — FUROSEMIDE 10 MG/ML IJ SOLN
20.0000 mg | Freq: Once | INTRAMUSCULAR | Status: AC
  Administered 2011-05-08: 20 mg via INTRAVENOUS
  Filled 2011-05-08: qty 2

## 2011-05-08 MED ORDER — ACETAMINOPHEN 325 MG PO TABS
650.0000 mg | ORAL_TABLET | Freq: Once | ORAL | Status: AC
  Administered 2011-05-08: 650 mg via ORAL
  Filled 2011-05-08: qty 2

## 2011-05-08 MED ORDER — MIDAZOLAM HCL 10 MG/2ML IJ SOLN
INTRAMUSCULAR | Status: AC
  Filled 2011-05-08: qty 4

## 2011-05-08 MED ORDER — MIDAZOLAM HCL 10 MG/2ML IJ SOLN
INTRAMUSCULAR | Status: DC | PRN
  Administered 2011-05-08 (×3): 2 mg via INTRAVENOUS

## 2011-05-08 MED ORDER — DIPHENHYDRAMINE HCL 50 MG/ML IJ SOLN
25.0000 mg | Freq: Once | INTRAMUSCULAR | Status: AC
  Administered 2011-05-08: 25 mg via INTRAVENOUS
  Filled 2011-05-08: qty 1

## 2011-05-08 MED ORDER — IOHEXOL 350 MG/ML SOLN
100.0000 mL | Freq: Once | INTRAVENOUS | Status: AC | PRN
  Administered 2011-05-08: 100 mL via INTRAVENOUS

## 2011-05-08 NOTE — Interval H&P Note (Signed)
History and Physical Interval Note:  05/08/2011 12:42 PM  [name]  has presented today for surgery, with the diagnosis of Anemia  The various methods of treatment have been discussed with the patient and family. After consideration of risks, benefits and other options for treatment, the patient has consented to  Procedure(s) (LRB): ESOPHAGOGASTRODUODENOSCOPY (EGD) (N/A) as a surgical intervention .  The patients' history has been reviewed, patient examined, no change in status, stable for surgery.  I have reviewed the patients' chart and labs.  Questions were answered to the patient's satisfaction.     Colena Ketterman D

## 2011-05-08 NOTE — Progress Notes (Signed)
PATIENT DETAILS Name: [name] Age: 61 y.o. Sex: male Date of Birth: 1950/11/27 Admit Date: 05/07/2011 PCP:No primary provider on file.  Subjective: Still with some right thigh pain He think he had more black tarry stools over the past few days-however non since admission  Objective: Vital signs in last 24 hours: Filed Vitals:   05/07/11 2015 05/07/11 2300 05/08/11 0400 05/08/11 0503  BP: 100/60 99/60 107/67 114/72  Pulse: 90 93 81 98  Temp: 98 F (36.7 C) 98.3 F (36.8 C) 98.1 F (36.7 C) 98.1 F (36.7 C)  TempSrc: Oral Oral Oral Oral  Resp: 18 18 16 20   Height:      Weight:      SpO2:  98% 99% 97%    Weight change:   Body mass index is 44.24 kg/(m^2).  Intake/Output from previous day:  Intake/Output Summary (Last 24 hours) at 05/08/11 1130 Last data filed at 05/08/11 0806  Gross per 24 hour  Intake 796.67 ml  Output   1975 ml  Net -1178.33 ml    PHYSICAL EXAM: Gen Exam: Awake and alert with clear speech.   Neck: Supple, No JVD.   Chest: B/L Clear.  CVS: S1 S2 Regular, no murmurs. Abdomen: soft, BS +, non tender, non distended.  Extremities: no edema, lower extremities warm to touch. Neurologic: Non Focal.   Skin: No Rash.   Wounds: N/A.    CONSULTS:  GI and vascular surgery  LAB RESULTS: CBC  Lab 05/08/11 0929 05/08/11 0308 05/07/11 0534 05/07/11 0522  WBC -- 10.3 -- 9.6  HGB 7.7* 7.3* 7.5* 7.0*  HCT 23.1* 21.9* 22.0* 21.5*  PLT -- 275 -- 336  MCV -- 86.9 -- 90.7  MCH -- 29.0 -- 29.5  MCHC -- 33.3 -- 32.6  RDW -- 21.4* -- 20.4*  LYMPHSABS -- -- -- --  MONOABS -- -- -- --  EOSABS -- -- -- --  BASOSABS -- -- -- --  BANDABS -- -- -- --    Chemistries   Lab 05/07/11 0823 05/07/11 0534  NA -- 140  K -- 3.4*  CL -- 106  CO2 -- --  GLUCOSE -- 119*  BUN -- 20  CREATININE -- 1.10  CALCIUM -- --  MG 2.0 --    GFR Estimated Creatinine Clearance: 111.5 ml/min (by C-G formula based on Cr of 1.1).  Coagulation profile  Lab  05/08/11 0929 05/08/11 0307 05/07/11 0643  INR 2.15* 1.77* >10.00*  PROTIME -- -- --    Cardiac Enzymes No results found for this basename: CK:3,CKMB:3,TROPONINI:3,MYOGLOBIN:3 in the last 168 hours  No components found with this basename: POCBNP:3 No results found for this basename: DDIMER:2 in the last 72 hours No results found for this basename: HGBA1C:2 in the last 72 hours No results found for this basename: CHOL:2,HDL:2,LDLCALC:2,TRIG:2,CHOLHDL:2,LDLDIRECT:2 in the last 72 hours No results found for this basename: TSH,T4TOTAL,FREET3,T3FREE,THYROIDAB in the last 72 hours  Basename 05/07/11 1025  VITAMINB12 245  FOLATE 9.2  FERRITIN 36  TIBC 236  IRON 21*  RETICCTPCT 5.6*   No results found for this basename: LIPASE:2,AMYLASE:2 in the last 72 hours  Urine Studies No results found for this basename: UACOL:2,UAPR:2,USPG:2,UPH:2,UTP:2,UGL:2,UKET:2,UBIL:2,UHGB:2,UNIT:2,UROB:2,ULEU:2,UEPI:2,UWBC:2,URBC:2,UBAC:2,CAST:2,CRYS:2,UCOM:2,BILUA:2 in the last 72 hours  MICROBIOLOGY: No results found for this or any previous visit (from the past 240 hour(s)).  RADIOLOGY STUDIES/RESULTS: Ct Abdomen Pelvis Wo Contrast  05/08/2011  *RADIOLOGY REPORT*  Clinical Data: Leg numbness.  Anemia.  Rule out retroperitoneal bleed.  CT ABDOMEN AND PELVIS WITHOUT CONTRAST  Technique:  Multidetector CT imaging of the abdomen and pelvis was performed following the standard protocol without intravenous contrast.  Comparison: CT pelvis 05/07/2011.  CT abdomen and pelvis 08/20/2006  Findings: Slight fibrosis in the lung bases.  Small esophageal hiatal hernia.  Circumscribed cyst in the anterior right lobe of the liver is stable since the prior study.  Unenhanced appearance of the liver, spleen, gallbladder, pancreas, and adrenal glands is otherwise unremarkable.  There is increased density in the renal collecting systems, ureters, and bladder bilaterally suggesting previous contrast administration.  Mild  calcification of the abdominal aorta.  Bilateral iliac artery aneurysms, measuring 3.2 cm on the right and 3.6 cm on the left.  There is infiltration around the right iliac artery aneurysm and along the iliopsoas muscle suggesting edema, inflammation, or hematoma, possibly due to rupture.  Asymmetric enlargement of the right piriformis muscle with a vague low attenuation change.  This might represent intramuscular fluid collection or hematoma. Moderate sized umbilical hernia containing fat.  No free air or free fluid in the abdomen.  Stomach, small bowel, and colon are not distended.  Pelvis:  Prostate gland is not enlarged.  Bladder wall is not thickened.  No significant pelvic lymphadenopathy.  Degenerative changes in the lumbar spine with normal alignment.  IMPRESSION: Infiltration around the right iliopsoas muscle with asymmetric enlargement of the iliopsoas and piriformis muscles.  Suggestion of low attenuation change in the right piriformis muscle.  Changes may represent inflammatory edema or hematoma.  There is a right iliac artery aneurysm and rupture is not excluded.  There is also a left iliac artery aneurysm.  Results discussed by telephone at the time of dictation, 0209 hours on 05/08/2011 with  the patient's nurse, Lamar Laundry,  who aknowledged these results and will contact the referring physician.  Original Report Authenticated By: Marlon Pel, M.D.   Dg Chest 1 View  04/16/2011  *RADIOLOGY REPORT*  Clinical Data: Pharm[employer] study.  CHEST - 1 VIEW  Comparison: 05/01/2010  Findings: Heart and mediastinal contours are within normal limits. No focal opacities or effusions.  No acute bony abnormality.  IMPRESSION: No active cardiopulmonary disease.  Original Report Authenticated By: Cyndie Chime, M.D.   Dg Chest 2 View  05/07/2011  *RADIOLOGY REPORT*  Clinical Data: Right thigh pain since 10:00 a.m.  No known injury. Shortness of breath.  Numbness.  CHEST - 2 VIEW  Comparison: 04/16/2011   Findings: Shallow inspiration.  Mild cardiac enlargement.  Normal pulmonary vascularity.  No focal airspace consolidation or edema. No blunting of the costophrenic angles.  No pneumothorax. Degenerative changes in the thoracic spine.  No significant change since previous study.  IMPRESSION: No evidence of active pulmonary disease.  Original Report Authenticated By: Marlon Pel, M.D.   Ct Head Wo Contrast  05/07/2011  *RADIOLOGY REPORT*  Clinical Data: Right leg numbness. Light bothers eyes.  CT HEAD WITHOUT CONTRAST  Technique:  Contiguous axial images were obtained from the base of the skull through the vertex without contrast.  Comparison: None.  Findings: The ventricles and sulci appear symmetrical.  No mass effect or midline shift.  No abnormal extra-axial fluid collections.  Vague focal area of low attenuation in the left parietal white matter posterior to the sylvian fissure.  This may represent partial voluming with the posterior horn of the left lateral ventricle but a focal area of early ischemia can also have this appearance.  No evidence of acute intracranial hemorrhage. Gray-white matter junctions are distinct.  Basal cisterns are not  effaced.  No depressed skull fractures.  Vascular calcifications. Visualized paranasal sinuses and mastoid air cells are not opacified.  Old nasal bone fracture.  IMPRESSION: Nonspecific low attenuation area in the left posterior parietal white matter which could represent early ischemic focus versus partial voluming.  No significant mass effect.  No acute intracranial hemorrhage.  Original Report Authenticated By: Marlon Pel, M.D.   Ct Femur Right W Contrast  05/07/2011  *RADIOLOGY REPORT*  Clinical Data: Right thigh pain.  History of abscess.  CT OF THE RIGHT LOWER EXTREMITY WITH CONTRAST  Contrast: OMNIPAQUE IOHEXOL 300 MG/ML  SOLN  Comparison: CT abdomen and pelvis 08/20/2006.  Findings: No abscess is identified.  There is infiltration of fat  about the iliopsoas muscle belly and the muscle appears mildly edematous.  Ossification of the iliopsoas tendon at the lesser trochanter is compatible with old injury/enthesopathic change. Visualized muscles and tendons otherwise are normal in appearance. Subcutaneous fatty tissues are normal in appearance.  No mass is identified.  No bony destructive change or fracture is seen.  Marked appearing degenerative disease about the right knee is noted with a small joint effusion is seen. There is a loose body in the posterior aspect of the joint measuring 1.5 cm in diameter.  Imaged intrapelvic contents demonstrate a right external iliac node measuring 1.5 cm short axis dimension on image 8.  IMPRESSION:  1.  Negative for abscess. 2.  Infiltration of fat about the iliopsoas muscle in the proximal thigh with some edematous change within the muscle belly could be due to infectious or inflammatory change.  Muscle strain could create a similar appearance.  Enthesopathic change of the iliopsoas tendon is again noted. 3.  Small right external iliac lymph node is likely reactive. 4.  Advanced degenerative disease about the right knee.  Original Report Authenticated By: Bernadene Bell. D'ALESSIO, M.D.   Dg Shoulder Left  05/01/2011  *RADIOLOGY REPORT*  Clinical Data: Pain post crushing injury  LEFT SHOULDER - 2+ VIEW  Comparison: None.  Findings: Three views of the left shoulder submitted.  No acute fracture or subluxation.  No radiopaque foreign body.  IMPRESSION: No acute fracture or subluxation.  Original Report Authenticated By: Natasha Mead, M.D.   Dg Humerus Left  05/01/2011  *RADIOLOGY REPORT*  Clinical Data: Injured left arm and shoulder approximately 1 week ago while pulling in self out of a car.  Patient unable to adduct the left arm above chest level.  LEFT HUMERUS - 2+ VIEW 05/01/2011:  Comparison: Left shoulder x-rays obtained concurrently.  Findings: No evidence of acute, subacute, or healed fractures.  No intrinsic  osseous abnormalities involving the humerus.  Well- preserved bone mineral density.  Visualized elbow joint intact.  IMPRESSION: Normal examination.  Original Report Authenticated By: Arnell Sieving, M.D.   Ct Angio Abd/pel W/ And/or W/o  05/08/2011  *RADIOLOGY REPORT*  Clinical Data: Left leg weakness and pain.  Known iliac aneurysms with infiltration or hematoma in the right iliopsoas region on prior unenhanced CT.  Decreasing hemoglobin with multiple transfusions.  CT ANGIOGRAPHY ABDOMEN AND PELVIS WITH CONTRAST AND WITHOUT CONTRAST  Comparison: Unenhanced CT 05/08/2011  Findings: The study is technically limited due to the patient's body habitus.  There is overlying artifact centered at the area of interest.  This limits visualization of this area.  However, there appears to be flow throughout the right and left iliac artery aneurysms without evidence of thrombosis or occlusion.  There is no obvious contrast extravasation although a  small focus of active hemorrhage could be obscured.  Again demonstrated is a infiltration or hematoma around the right iliac artery aneurysm and in the iliopsoas recent extending down to the growing and in the piriformis muscles as before.  No significant increase.  The lung bases are clear.  Cyst again demonstrated in the liver. Cyst in the right kidney.  No hydronephrosis.  Normal homogeneous parenchymal nephrograms.  The gallbladder, pancreas, spleen, adrenal glands, the stomach, small bowel, colon, and retroperitoneal lymph nodes are unremarkable.  Small esophageal hiatal hernia.  Calcification of the aorta.  Broad-based umbilical hernia containing fat.  Pelvis:  Bladder wall is not thickened.  No significant pelvic lymphadenopathy.  No evidence of loculated fluid collection.  The appendix is normal.  IMPRESSION: Technically limited study due to the patient's body habitus. Bilateral iliac artery aneurysms are again demonstrated with infiltration or hematoma in the fat  around the right iliac aneurysm and in the iliopsoas and piriformis muscles.  No definite evidence of active contrast extravasation.  Intermittent or low grade leakage is not excluded.  Original Report Authenticated By: Marlon Pel, M.D.    MEDICATIONS: Scheduled Meds:   . acetaminophen  650 mg Oral Once  . antiseptic oral rinse  15 mL Mouth Rinse q12n4p  . atorvastatin  10 mg Oral q1800  . chlorhexidine  15 mL Mouth Rinse BID  . digoxin  125 mcg Oral Daily  . diphenhydrAMINE  25 mg Intravenous Once  . fluocinonide ointment  1 application Topical BID  . furosemide  20 mg Intravenous Once  . hydrocortisone cream   Topical BID  . potassium chloride  40 mEq Oral BID  . sodium chloride  3 mL Intravenous Q12H  . triamcinolone ointment  1 application Topical Daily  . DISCONTD: diltiazem  360 mg Oral Daily  . DISCONTD: simvastatin  40 mg Oral q1800   Continuous Infusions:   . sodium chloride 100 mL/hr at 05/08/11 0815   PRN Meds:.acetaminophen, acetaminophen, iohexol, morphine, ondansetron (ZOFRAN) IV, ondansetron, oxyCODONE, oxyCODONE-acetaminophen, sodium chloride, DISCONTD: sodium chloride, DISCONTD: sodium chloride, DISCONTD: oxyCODONE-acetaminophen  Antibiotics: Anti-infectives    None      Assessment/Plan: Patient Active Hospital Problem List: Right leg numbness /pain -is likely secondary to the infiltration/hematoma seen around the right iliac artery aneurysm -has no overt weakness, cant move right leg-but this is due to pain! -left lower ext is normal and pain free   Supratherapeutic INR  -patient claims no recent dose changes, denies any antibiotic use as well -INR significantly down today-after Vit K and 2 units of FFP -Will give another unit of FFP this am as INR >2  Anemia -from blood loss -this is likely GI bleed-from supra-therapeutic INR -will need GI work up prior to recommencing anticoagulation,have spoken with Dr Elnoria Howard for possible EGD today - This is  his second episode of bleeding, had a significant hematoma in left lower ext 2008  PAF -hold cardizem as BP soft -continue with Digoxin  HTN (hypertension)  -currently stable off meds  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 05/08/2011, 11:30 AM

## 2011-05-08 NOTE — Op Note (Signed)
[facility] [address] [place], [place]  [address]  OPERATIVE PROCEDURE REPORT  PATIENT:  [name], [name]  MR#:  [id number] BIRTHDATE:  1950/04/06  GENDER:  male ENDOSCOPIST:  Jeani Hawking, MD ASSISTANT:  Rolanda Lundborg and Angelique Blonder PROCEDURE DATE:  05/08/2011 PROCEDURE:  EGD, diagnostic [phone] ASA CLASS:  Class III INDICATIONS:  Anemia, Melena, and Heme positive stool MEDICATIONS:  Fentanyl 50 mcg IV, Versed 6 mg IV  DESCRIPTION OF PROCEDURE:   After the risks benefits and alternatives of the procedure were thoroughly explained, informed consent was obtained.  The Pentax Gastroscope [id number] endoscope was introduced through the mouth and advanced to the second portion of the duodenum, without limitations.  The instrument was slowly withdrawn as the mucosa was fully examined. <<PROCEDUREIMAGES>>  FINDINGS:  The Z-line was sharp. Some evidence of scant hematin was identified, but active bleeding. In the duodenal bulb there was a large 2 cm polypoid lesion.l Proximal to the polyp and just distal to the pylorus was a diffuse sessile polypoid lesion. No evidence of any active bleeding despite the INR of 2.1.    No biopsies were obtained as the INR was elevated and the patient was very difficult to sedate.  He was combative during the procedure. Retroflexed views revealed no abnormalities.    The scope was then withdrawn from the patient and the procedure terminated.  COMPLICATIONS:  None  IMPRESSION: 1) Duoenal sessile polyps 2) Repeat EGD with biopsie once acute issues have resolved. RECOMMENDATIONS:  1) Colonoscopy tomorrow.  ______________________________ Jeani Hawking, MD  n. Rosalie DoctorJeani Hawking at 05/08/2011 01:17 PM  Eliott Nine, [phone]

## 2011-05-08 NOTE — Consult Note (Addendum)
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 (November 11, 1950) male who presents with chief complaint: right leg pain and numbness.  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  The patient does not have back or abdominal pain.  The patient notes right thigh numbness in the same area of his pain.  The patient does not history of embolic episodes.  The patient's risk factors for aneurysmal disease included: male sex, age, and prior smoke.  The patient does not currently smoke cigarettes.  The patient has known history of DVT and is on Coumadin.  Past Medical History  Diagnosis Date  . Hypertension   . Chronic pain   . Obesity   . DVT (deep venous thrombosis)   . Dysrhythmia     atrial fibrilation  . Shortness of breath   . Blood transfusion 05/07/2011     " no reaction "  . Arthritis     Past Surgical History  Procedure Date  . Knee surgery     History   Social History  . Marital Status: Divorced    Spouse Name: N/A    Number of Children: N/A  . Years of Education: N/A   Occupational History  . Not on file.   Social History Main Topics  . Smoking status: Former Smoker -- 0.5 packs/day for 2 years  . Smokeless tobacco: Never Used  . Alcohol Use: Yes     rarely  . Drug Use: No  . Sexually Active: Yes   Other Topics Concern  . Not on file   Social History Narrative  . No narrative on file    Family History  Problem Relation Age of Onset  . Cancer Mother     No current facility-administered medications on file prior to encounter.   Current Outpatient Prescriptions on File Prior to Encounter  Medication Sig Dispense Refill  . digoxin (LANOXIN) 0.125 MG tablet Take 125 mcg by mouth daily.      [name] diltiazem (CARDIZEM CD) 360 MG 24 hr capsule Take 360 mg by mouth daily.      . pravastatin (PRAVACHOL) 40 MG tablet  Take 40 mg by mouth daily.      [name] warfarin (COUMADIN) 5 MG tablet Take 15 mg by mouth daily.        No Known Allergies  REVIEW OF SYSTEMS:  (Positives checked otherwise negative)  CARDIOVASCULAR: [ ]  chest pain    [ ]  chest pressure    [ ]  palpitations   [ ]  orthopnea   [ ]  dyspnea on exert. [ ]  claudication    [ ]  rest pain     [x]  DVT     [ ]  phlebitis  PULMONARY:    [ ]  productive cough [ ]  asthma  [ ]  wheezing  NEUROLOGIC:    [ ]  weakness    [ ]  paresthesias   [ ]  aphasia    [ ]  amaurosis    [ ]  dizziness  HEMATOLOGIC:    [ ]  bleeding problems  [ ]  clotting disorders  MUSCULOSKEL: [ ]  joint pain     [ ]  joint swelling  GASTROINTEST:  [ ]   blood in stool   [ ]   hematemesis  GENITOURINARY:   [ ]   dysuria    [ ]   hematuria  PSYCHIATRIC:   [ ]  history of major depression  INTEGUMENTARY: [x]  rashes: psoriasis [ ]  ulcers  CONSTITUTIONAL:  [ ]  fever     [ ]  chills  Physical Examination  Filed Vitals:   05/07/11 2015 05/07/11 2300 05/08/11 0400 05/08/11 0503  BP: 100/60 99/60 107/67 114/72  Pulse: 90 93 81 98  Temp: 98 F (36.7 C) 98.3 F (36.8 C) 98.1 F (36.7 C) 98.1 F (36.7 C)  TempSrc: Oral Oral Oral Oral  Resp: 18 18 16 20   Height:      Weight:      SpO2:  98% 99% 97%   Body mass index is 44.24 kg/(m^2).  General: A&O x 3, WDWN, large frame  Head: [place]/AT  Ear/Nose/Throat: Hearing grossly intact, nares w/o erythema or drainage, oropharynx w/o Erythema/Exudate  Eyes: PERRLA, EOMI  Neck: Supple, no nuchal rigidity, no palpable LAD  Pulmonary: Sym exp, good air movt, CTAB, no rales, rhonchi, & wheezing  Cardiac: RRR, Nl S1, S2, no Murmurs, rubs or gallops  Vascular: Vessel Right Left  Radial Palpable Palpable  Brachial Palpable Palpable  Carotid Palpable, without bruit Palpable, without bruit  Aorta Non-palpable N/A  Femoral Palpable Palpable  Popliteal Non-palpable Non-palpable  PT Palpable Palpable  DP Palpable Palpable   Gastrointestinal: soft,  NTND, -G/R, - HSM, - masses, - CVAT B  Musculoskeletal: M/S 5/5 throughout , Extremities without ischemic changes , no palpable fluid collection in right anterior thigh, mild TTP  Neurologic: CN 2-12 intact , Pain and light touch intact in extremities , Motor exam as listed above  Psychiatric: Judgment intact, Mood & affect appropriate for pt's clinical situation  Dermatologic: See M/S exam for extremity exam; scaly plaques on arms, abd, and legs,  Lymph : No Cervical, Axillary, or Inguinal lymphadenopathy   Laboratory: CBC:    Component Value Date/Time   WBC 10.3 05/08/2011 0308   RBC 2.52* 05/08/2011 0308   HGB 7.3* 05/08/2011 0308   HCT 21.9* 05/08/2011 0308   PLT 275 05/08/2011 0308   MCV 86.9 05/08/2011 0308   MCH 29.0 05/08/2011 0308   MCHC 33.3 05/08/2011 0308   RDW 21.4* 05/08/2011 0308   LYMPHSABS 1.3 08/12/2006 2154   MONOABS 1.0* 08/12/2006 2154   EOSABS 0.1 08/12/2006 2154   BASOSABS 0.0 08/12/2006 2154    BMP:    Component Value Date/Time   NA 140 05/07/2011 0534   K 3.4* 05/07/2011 0534   CL 106 05/07/2011 0534   CO2 23 09/03/2006 0450   GLUCOSE 119* 05/07/2011 0534   BUN 20 05/07/2011 0534   CREATININE 1.10 05/07/2011 0534   CALCIUM 8.6 09/03/2006 0450   GFRNONAA >60 09/03/2006 0450   GFRAA  Value: >60        The eGFR has been calculated using the MDRD equation. This calculation has not been validated in all clinical 09/03/2006 0450    Coagulation: Lab Results  Component Value Date   INR 1.77* 05/08/2011   INR >10.00* 05/07/2011   INR 2.3* 09/02/2006   No results found for this basename: PTT     Radiology Ct Abdomen Pelvis Wo Contrast  05/08/2011  *RADIOLOGY REPORT*  Clinical Data: Leg numbness.  Anemia.  Rule out retroperitoneal bleed.  CT ABDOMEN AND PELVIS WITHOUT CONTRAST  Technique:  Multidetector CT imaging of the abdomen and pelvis was performed following the standard protocol without intravenous contrast.  Comparison: CT pelvis 05/07/2011.  CT abdomen and  pelvis 08/20/2006  Findings: Slight fibrosis in the lung bases.  Small esophageal hiatal hernia.  Circumscribed cyst in the anterior  right lobe of the liver is stable since the prior study.  Unenhanced appearance of the liver, spleen, gallbladder, pancreas, and adrenal glands is otherwise unremarkable.  There is increased density in the renal collecting systems, ureters, and bladder bilaterally suggesting previous contrast administration.  Mild calcification of the abdominal aorta.  Bilateral iliac artery aneurysms, measuring 3.2 cm on the right and 3.6 cm on the left.  There is infiltration around the right iliac artery aneurysm and along the iliopsoas muscle suggesting edema, inflammation, or hematoma, possibly due to rupture.  Asymmetric enlargement of the right piriformis muscle with a vague low attenuation change.  This might represent intramuscular fluid collection or hematoma. Moderate sized umbilical hernia containing fat.  No free air or free fluid in the abdomen.  Stomach, small bowel, and colon are not distended.  Pelvis:  Prostate gland is not enlarged.  Bladder wall is not thickened.  No significant pelvic lymphadenopathy.  Degenerative changes in the lumbar spine with normal alignment.  IMPRESSION: Infiltration around the right iliopsoas muscle with asymmetric enlargement of the iliopsoas and piriformis muscles.  Suggestion of low attenuation change in the right piriformis muscle.  Changes may represent inflammatory edema or hematoma.  There is a right iliac artery aneurysm and rupture is not excluded.  There is also a left iliac artery aneurysm.  Results discussed by telephone at the time of dictation, 0209 hours on 05/08/2011 with  the patient's nurse, Lamar Laundry,  who aknowledged these results and will contact the referring physician.  Original Report Authenticated By: Marlon Pel, M.D.   Dg Chest 2 View  05/07/2011  *RADIOLOGY REPORT*  Clinical Data: Right thigh pain since 10:00 a.m.  No known  injury. Shortness of breath.  Numbness.  CHEST - 2 VIEW  Comparison: 04/16/2011  Findings: Shallow inspiration.  Mild cardiac enlargement.  Normal pulmonary vascularity.  No focal airspace consolidation or edema. No blunting of the costophrenic angles.  No pneumothorax. Degenerative changes in the thoracic spine.  No significant change since previous study.  IMPRESSION: No evidence of active pulmonary disease.  Original Report Authenticated By: Marlon Pel, M.D.   Ct Head Wo Contrast  05/07/2011  *RADIOLOGY REPORT*  Clinical Data: Right leg numbness. Light bothers eyes.  CT HEAD WITHOUT CONTRAST  Technique:  Contiguous axial images were obtained from the base of the skull through the vertex without contrast.  Comparison: None.  Findings: The ventricles and sulci appear symmetrical.  No mass effect or midline shift.  No abnormal extra-axial fluid collections.  Vague focal area of low attenuation in the left parietal white matter posterior to the sylvian fissure.  This may represent partial voluming with the posterior horn of the left lateral ventricle but a focal area of early ischemia can also have this appearance.  No evidence of acute intracranial hemorrhage. Gray-white matter junctions are distinct.  Basal cisterns are not effaced.  No depressed skull fractures.  Vascular calcifications. Visualized paranasal sinuses and mastoid air cells are not opacified.  Old nasal bone fracture.  IMPRESSION: Nonspecific low attenuation area in the left posterior parietal white matter which could represent early ischemic focus versus partial voluming.  No significant mass effect.  No acute intracranial hemorrhage.  Original Report Authenticated By: Marlon Pel, M.D.   Ct Femur Right W Contrast  05/07/2011  *RADIOLOGY REPORT*  Clinical Data: Right thigh pain.  History of abscess.  CT OF THE RIGHT LOWER EXTREMITY WITH CONTRAST  Contrast: OMNIPAQUE IOHEXOL 300 MG/ML  SOLN  Comparison:  CT abdomen and  pelvis 08/20/2006.  Findings: No abscess is identified.  There is infiltration of fat about the iliopsoas muscle belly and the muscle appears mildly edematous.  Ossification of the iliopsoas tendon at the lesser trochanter is compatible with old injury/enthesopathic change. Visualized muscles and tendons otherwise are normal in appearance. Subcutaneous fatty tissues are normal in appearance.  No mass is identified.  No bony destructive change or fracture is seen.  Marked appearing degenerative disease about the right knee is noted with a small joint effusion is seen. There is a loose body in the posterior aspect of the joint measuring 1.5 cm in diameter.  Imaged intrapelvic contents demonstrate a right external iliac node measuring 1.5 cm short axis dimension on image 8.  IMPRESSION:  1.  Negative for abscess. 2.  Infiltration of fat about the iliopsoas muscle in the proximal thigh with some edematous change within the muscle belly could be due to infectious or inflammatory change.  Muscle strain could create a similar appearance.  Enthesopathic change of the iliopsoas tendon is again noted. 3.  Small right external iliac lymph node is likely reactive. 4.  Advanced degenerative disease about the right knee.  Original Report Authenticated By: Bernadene Bell. Maricela Curet, M.D.    I reviewed this patient's non-contrast CT Abd/Pelvis, it does not demonstrate any evidence of a large retroperitoneal bleed which would be expected if either common iliac aneurysm was ruptured.  I also reviewed the preliminary CTA Abd/Pelvis, there is no large retroperitoneal bleed present.   Bilateral CIA aneurysms are present and appear intact without obvious extravasation.  The left CIA looks somewhat saccular.  Unfortunately, the scan is so grainy so I can't clearly rule out a left internal iliac artery aneurysms.    Medical Decision Making  The patient is a 61 y.o. male who presents with: anemia etiology unknown, R thigh pain, B CIA  aneurysms   Given patient initial INR >10, if accurate, may suggest medically managed bleeding.  INR continues to > 1.4, so I could continue reversal of his anticoagulation with FFP.  The patient's clinical presentation is not impressive for possible iliac arterial bleeding.  I don't see on the CT a large retroperitoneal bleed that should be present to account for his amount of blood loss.  I have no concerns with the right iliac artery which the radiologist seems to continue to have some concerns.  He is going to need an aortogram, bilateral leg runoff on Monday to full interrogate this iliac artery aneurysms, as unfortunately the CT is grainy to see the full anatomy of the left iliac system.  We'll have to see over the weekend if he is stable enough to undergo such study.  Consider transfer to ICU for aggressive resuscitation while work-up on bleeding in process.  The patient has no evidence of bleeding in his right thigh so I am not certain the etiology of that pain.  I doubt it's of vascular etiology especially given palpable pulses in both feet.  I emphasized the importance of maximal medical management including strict control of blood pressure, blood glucose, and lipid levels, antiplatelet agents, obtaining regular exercise, and cessation of smoking.    Thank you for allowing [place] to participate in this patient's care.  Leonides Sake, MD [facility] [place] Office: [phone] Pager: [phone]  05/08/2011, 6:17 AM

## 2011-05-08 NOTE — Progress Notes (Signed)
Out of concern for bleeding, pt had a CT abd/pelvis earlier today, results called to RN who then called this NP. NP reviewed results of CT and afterwards, ordered a CBC to f/up the one done earlier today. The pt had received 2 U PRBCs since then. The CBC showed the H&H was lower than earlier lab even after the transfusion. This NP called vascular surgeon on call, Imogene Burn, to relate findings and ask him to consult on pt. He asked me to order a CTA abd/pelvis STAT and he would see the pt. CTA ordered. This NP went to bedside of pt and explained what was going on and that there is a possibility that he could be bleeding in his abdomen. Pt wasn't upset and states he understands.  BP is running between 98-112 systolic. I ordered his NS IVF increased to 100cc/hr until we see the results of the CTA. If pt becomes hemodynamically unstable, will move to step down unit.  Dr. Joneen Roach aware of plan and agrees. Maren Reamer, NP [facility]

## 2011-05-08 NOTE — Clinical Documentation Improvement (Signed)
BMI DOCUMENTATION CLARIFICATION QUERY  THIS DOCUMENT IS NOT A PERMANENT PART OF THE MEDICAL RECORD  TO RESPOND TO THE THIS QUERY, FOLLOW THE INSTRUCTIONS BELOW:  1. If needed, update documentation for the patient's encounter via the notes activity.  2. Access this query again and click edit on the In [employer].  3. After updating, or not, click F2 to complete all highlighted (required) fields concerning your review. Select "additional documentation in the medical record" OR "no additional documentation provided".  4. Click Sign note button.  5. The deficiency will fall out of your In Basket *Please let us know if you are not able to complete this workflow by phone or e-mail (listed below).         05/08/11  Dear Dr.Abbye Lao [facility]  In an effort to better capture your patient's severity of illness, reflect appropriate length of stay and utilization of resources, a review of the patient medical record has revealed the following indicators.    Based on your clinical judgment, please clarify and document in a progress note and/or discharge summary the clinical condition associated with the following supporting information:  In responding to this query please exercise your independent judgment.  The fact that a query is asked, does not imply that any particular answer is desired or expected.    Possible Clinical conditions  Morbid Obesity W/ BMI=   Underweight w/BMI=  Other condition  Cannot Clinically determine  BEST PRACTICE: When linking BMI to a diagnosis please document both BMI and diagnosis together in progress note for accuracy of SOI and ROM.   Possible signs & symptoms: Chronic pain per H&P  Weight: 343lbs on admission Height   6"2in BMI=     44.1 in CHL  Women Men  Underweight < 19    <20     Acceptable 19 - 25    20 - 25   Overweight 25 - 30    25 - 30  Obese 30 - 40    30 - 40   Morbidly Obese > 40    > 40      [employer]   Treatment Monitoring  weight  Reviewed: additional documentation in the medical record  Thank You,   Rossie Muskrat RN, BSN Clinical Documentation Specialist Pager:  [phone] [email or web address]  Health Information Management [place]    .

## 2011-05-08 NOTE — Progress Notes (Signed)
   CARE MANAGEMENT NOTE 05/08/2011  Patient:  LIBERTY, STEAD   Account Number:  [id number]  Date Initiated:  05/08/2011  Documentation initiated by:  Donn Pierini  Subjective/Objective Assessment:   Pt admitted with right leg numbness     Action/Plan:   PTA pt lived at home alone, PT eval ordered   Anticipated DC Date:  05/11/2011   Anticipated DC Plan:  HOME W HOME HEALTH SERVICES      DC Planning Services  CM consult      Choice offered to / List presented to:             Status of service:  In process, will continue to follow Medicare Important Message given?   (If response is "NO", the following Medicare IM given date fields will be blank) Date Medicare IM given:   Date Additional Medicare IM given:    Discharge Disposition:    Per UR Regulation:    If discussed at Long Length of Stay Meetings, dates discussed:    Comments:  PCP-  05/08/11- 1200- Donn Pierini RN, BSN [phone] Possible EGD today ? GIB- subtherapeutic INR - will transfuse FFP today,  PT eval pending- CM to follow for d/c needs-

## 2011-05-08 NOTE — Consult Note (Signed)
Reason for Consult: Anemia, Heme positive stool, and Melena Referring Physician: [facility]  [place] HPI: This is a 61 year old patient admitted to the hospital with leg weakness and pain.  Further evaluation revealed that he has bilateral aneurysms of his iliac arteries with possible leakage.  His INR was supratherapeutic and he was on coumadin for his history of DVT and PEs.  The INR was >10 and starting last Thursday he reports having episodes of melena.  No prior history of GI bleed.  The patient denies any issues with abdominal pain, nausea, vomiting, diarrhea, or constipation.  No prior history of having a colonoscopy and there is no family history of colon cancer.  AS a result of his symptoms a GI consultation was requested.  Past Medical History  Diagnosis Date  . Hypertension   . Chronic pain   . Obesity   . DVT (deep venous thrombosis)   . Dysrhythmia     atrial fibrilation  . Shortness of breath   . Blood transfusion 05/07/2011     " no reaction "  . Arthritis     Past Surgical History  Procedure Date  . Knee surgery     Family History  Problem Relation Age of Onset  . Cancer Mother     Social History:  reports that he has quit smoking. He has never used smokeless tobacco. He reports that he drinks alcohol. He reports that he does not use illicit drugs.  Allergies: No Known Allergies  Medications:  Scheduled:   . acetaminophen  650 mg Oral Once  . acetaminophen  650 mg Oral Once  . antiseptic oral rinse  15 mL Mouth Rinse q12n4p  . atorvastatin  10 mg Oral q1800  . chlorhexidine  15 mL Mouth Rinse BID  . digoxin  125 mcg Oral Daily  . diphenhydrAMINE  25 mg Intravenous Once  . fluocinonide ointment  1 application Topical BID  . furosemide  20 mg Intravenous Once  . hydrocortisone cream   Topical BID  . potassium chloride  40 mEq Oral BID  . sodium chloride  3 mL Intravenous Q12H  . triamcinolone ointment  1 application Topical Daily  .  DISCONTD: diltiazem  360 mg Oral Daily  . DISCONTD: diphenhydrAMINE  25 mg Intravenous Once  . DISCONTD: furosemide  20 mg Intravenous Once  . DISCONTD: simvastatin  40 mg Oral q1800   Continuous:   . sodium chloride 100 mL/hr at 05/08/11 0815    Results for orders placed during the hospital encounter of 05/07/11 (from the past 24 hour(s))  PROTIME-INR     Status: Abnormal   Collection Time   05/08/11  3:07 AM      Component Value Range   Prothrombin Time 20.9 (*) 11.6 - 15.2 (seconds)   INR 1.77 (*) 0.00 - 1.49   CBC     Status: Abnormal   Collection Time   05/08/11  3:08 AM      Component Value Range   WBC 10.3  4.0 - 10.5 (K/uL)   RBC 2.52 (*) 4.22 - 5.81 (MIL/uL)   Hemoglobin 7.3 (*) 13.0 - 17.0 (g/dL)   HCT 16.1 (*) 09.6 - 52.0 (%)   MCV 86.9  78.0 - 100.0 (fL)   MCH 29.0  26.0 - 34.0 (pg)   MCHC 33.3  30.0 - 36.0 (g/dL)   RDW 04.5 (*) 40.9 - 15.5 (%)   Platelets 275  150 - 400 (K/uL)  HEMOGLOBIN AND HEMATOCRIT,  BLOOD     Status: Abnormal   Collection Time   05/08/11  9:29 AM      Component Value Range   Hemoglobin 7.7 (*) 13.0 - 17.0 (g/dL)   HCT 16.1 (*) 09.6 - 52.0 (%)  PROTIME-INR     Status: Abnormal   Collection Time   05/08/11  9:29 AM      Component Value Range   Prothrombin Time 24.4 (*) 11.6 - 15.2 (seconds)   INR 2.15 (*) 0.00 - 1.49   PREPARE RBC (CROSSMATCH)     Status: Normal   Collection Time   05/08/11 10:43 AM      Component Value Range   Order Confirmation ORDER PROCESSED BY BLOOD BANK    PREPARE FRESH FROZEN PLASMA     Status: Normal (Preliminary result)   Collection Time   05/08/11 11:35 AM      Component Value Range   Unit Number [id number]     Blood Component Type THAWED PLASMA     Unit division 00     Status of Unit ALLOCATED     Transfusion Status OK TO TRANSFUSE       Ct Abdomen Pelvis Wo Contrast  05/08/2011  *RADIOLOGY REPORT*  Clinical Data: Leg numbness.  Anemia.  Rule out retroperitoneal bleed.  CT ABDOMEN AND PELVIS WITHOUT  CONTRAST  Technique:  Multidetector CT imaging of the abdomen and pelvis was performed following the standard protocol without intravenous contrast.  Comparison: CT pelvis 05/07/2011.  CT abdomen and pelvis 08/20/2006  Findings: Slight fibrosis in the lung bases.  Small esophageal hiatal hernia.  Circumscribed cyst in the anterior right lobe of the liver is stable since the prior study.  Unenhanced appearance of the liver, spleen, gallbladder, pancreas, and adrenal glands is otherwise unremarkable.  There is increased density in the renal collecting systems, ureters, and bladder bilaterally suggesting previous contrast administration.  Mild calcification of the abdominal aorta.  Bilateral iliac artery aneurysms, measuring 3.2 cm on the right and 3.6 cm on the left.  There is infiltration around the right iliac artery aneurysm and along the iliopsoas muscle suggesting edema, inflammation, or hematoma, possibly due to rupture.  Asymmetric enlargement of the right piriformis muscle with a vague low attenuation change.  This might represent intramuscular fluid collection or hematoma. Moderate sized umbilical hernia containing fat.  No free air or free fluid in the abdomen.  Stomach, small bowel, and colon are not distended.  Pelvis:  Prostate gland is not enlarged.  Bladder wall is not thickened.  No significant pelvic lymphadenopathy.  Degenerative changes in the lumbar spine with normal alignment.  IMPRESSION: Infiltration around the right iliopsoas muscle with asymmetric enlargement of the iliopsoas and piriformis muscles.  Suggestion of low attenuation change in the right piriformis muscle.  Changes may represent inflammatory edema or hematoma.  There is a right iliac artery aneurysm and rupture is not excluded.  There is also a left iliac artery aneurysm.  Results discussed by telephone at the time of dictation, 0209 hours on 05/08/2011 with  the patient's nurse, Lamar Laundry,  who aknowledged these results and will  contact the referring physician.  Original Report Authenticated By: Marlon Pel, M.D.   Dg Chest 2 View  05/07/2011  *RADIOLOGY REPORT*  Clinical Data: Right thigh pain since 10:00 a.m.  No known injury. Shortness of breath.  Numbness.  CHEST - 2 VIEW  Comparison: 04/16/2011  Findings: Shallow inspiration.  Mild cardiac enlargement.  Normal pulmonary vascularity.  No focal  airspace consolidation or edema. No blunting of the costophrenic angles.  No pneumothorax. Degenerative changes in the thoracic spine.  No significant change since previous study.  IMPRESSION: No evidence of active pulmonary disease.  Original Report Authenticated By: Marlon Pel, M.D.   Ct Head Wo Contrast  05/07/2011  *RADIOLOGY REPORT*  Clinical Data: Right leg numbness. Light bothers eyes.  CT HEAD WITHOUT CONTRAST  Technique:  Contiguous axial images were obtained from the base of the skull through the vertex without contrast.  Comparison: None.  Findings: The ventricles and sulci appear symmetrical.  No mass effect or midline shift.  No abnormal extra-axial fluid collections.  Vague focal area of low attenuation in the left parietal white matter posterior to the sylvian fissure.  This may represent partial voluming with the posterior horn of the left lateral ventricle but a focal area of early ischemia can also have this appearance.  No evidence of acute intracranial hemorrhage. Gray-white matter junctions are distinct.  Basal cisterns are not effaced.  No depressed skull fractures.  Vascular calcifications. Visualized paranasal sinuses and mastoid air cells are not opacified.  Old nasal bone fracture.  IMPRESSION: Nonspecific low attenuation area in the left posterior parietal white matter which could represent early ischemic focus versus partial voluming.  No significant mass effect.  No acute intracranial hemorrhage.  Original Report Authenticated By: Marlon Pel, M.D.   Ct Femur Right W Contrast  05/07/2011   *RADIOLOGY REPORT*  Clinical Data: Right thigh pain.  History of abscess.  CT OF THE RIGHT LOWER EXTREMITY WITH CONTRAST  Contrast: OMNIPAQUE IOHEXOL 300 MG/ML  SOLN  Comparison: CT abdomen and pelvis 08/20/2006.  Findings: No abscess is identified.  There is infiltration of fat about the iliopsoas muscle belly and the muscle appears mildly edematous.  Ossification of the iliopsoas tendon at the lesser trochanter is compatible with old injury/enthesopathic change. Visualized muscles and tendons otherwise are normal in appearance. Subcutaneous fatty tissues are normal in appearance.  No mass is identified.  No bony destructive change or fracture is seen.  Marked appearing degenerative disease about the right knee is noted with a small joint effusion is seen. There is a loose body in the posterior aspect of the joint measuring 1.5 cm in diameter.  Imaged intrapelvic contents demonstrate a right external iliac node measuring 1.5 cm short axis dimension on image 8.  IMPRESSION:  1.  Negative for abscess. 2.  Infiltration of fat about the iliopsoas muscle in the proximal thigh with some edematous change within the muscle belly could be due to infectious or inflammatory change.  Muscle strain could create a similar appearance.  Enthesopathic change of the iliopsoas tendon is again noted. 3.  Small right external iliac lymph node is likely reactive. 4.  Advanced degenerative disease about the right knee.  Original Report Authenticated By: Bernadene Bell. Maricela Curet, M.D.   Ct Angio Abd/pel W/ And/or W/o  05/08/2011  *RADIOLOGY REPORT*  Clinical Data: Left leg weakness and pain.  Known iliac aneurysms with infiltration or hematoma in the right iliopsoas region on prior unenhanced CT.  Decreasing hemoglobin with multiple transfusions.  CT ANGIOGRAPHY ABDOMEN AND PELVIS WITH CONTRAST AND WITHOUT CONTRAST  Comparison: Unenhanced CT 05/08/2011  Findings: The study is technically limited due to the patient's body habitus.   There is overlying artifact centered at the area of interest.  This limits visualization of this area.  However, there appears to be flow throughout the right and left iliac artery aneurysms  without evidence of thrombosis or occlusion.  There is no obvious contrast extravasation although a small focus of active hemorrhage could be obscured.  Again demonstrated is a infiltration or hematoma around the right iliac artery aneurysm and in the iliopsoas recent extending down to the growing and in the piriformis muscles as before.  No significant increase.  The lung bases are clear.  Cyst again demonstrated in the liver. Cyst in the right kidney.  No hydronephrosis.  Normal homogeneous parenchymal nephrograms.  The gallbladder, pancreas, spleen, adrenal glands, the stomach, small bowel, colon, and retroperitoneal lymph nodes are unremarkable.  Small esophageal hiatal hernia.  Calcification of the aorta.  Broad-based umbilical hernia containing fat.  Pelvis:  Bladder wall is not thickened.  No significant pelvic lymphadenopathy.  No evidence of loculated fluid collection.  The appendix is normal.  IMPRESSION: Technically limited study due to the patient's body habitus. Bilateral iliac artery aneurysms are again demonstrated with infiltration or hematoma in the fat around the right iliac aneurysm and in the iliopsoas and piriformis muscles.  No definite evidence of active contrast extravasation.  Intermittent or low grade leakage is not excluded.  Original Report Authenticated By: Marlon Pel, M.D.    ROS:  As stated above in the HPI otherwise negative.  Blood pressure 183/62, pulse 90, temperature 99.2 F (37.3 C), temperature source Oral, resp. rate 29, height 6\' 2"  (1.88 m), weight 155.584 kg (343 lb), SpO2 97.00%.    PE: Gen: NAD, Alert and Oriented HEENT:  [place]/AT, EOMI Neck: Supple, no LAD Lungs: CTA Bilaterally CV: RRR without M/G/R ABM: Soft, NTND, +BS Ext: No C/C/E  Assessment/Plan: 1)  Melena/Heme positive stool/Anemia. 2) Bilateral iliac artery aneurysms.   With the above history I will perform an EGD for further evaluation.  If the EGD is negative he will require a colonoscopy.  I did review the patients blood work and it is hard to tell if the anemia is acute or chronic.  The last documented blood work in the hospital was in 2008 in the 13 range.    Plan: 1) EGD now.  Further recommendations pending the findings.  Teal Bontrager D 05/08/2011, 1:18 PM

## 2011-05-08 NOTE — Progress Notes (Signed)
PT Note  Order received.  Pt has continued to have low Hgb and has been in endo.  Eval deferred today and will try again tomorrow.  [employer] PT [phone]

## 2011-05-08 NOTE — H&P (View-Only) (Signed)
PATIENT DETAILS Name: [name] Age: 61 y.o. Sex: male Date of Birth: 08/10/1950 Admit Date: 05/07/2011 PCP:No primary provider on file.  Subjective: Still with some right thigh pain He think he had more black tarry stools over the past few days-however non since admission  Objective: Vital signs in last 24 hours: Filed Vitals:   05/07/11 2015 05/07/11 2300 05/08/11 0400 05/08/11 0503  BP: 100/60 99/60 107/67 114/72  Pulse: 90 93 81 98  Temp: 98 F (36.7 C) 98.3 F (36.8 C) 98.1 F (36.7 C) 98.1 F (36.7 C)  TempSrc: Oral Oral Oral Oral  Resp: 18 18 16 20  Height:      Weight:      SpO2:  98% 99% 97%    Weight change:   Body mass index is 44.24 kg/(m^2).  Intake/Output from previous day:  Intake/Output Summary (Last 24 hours) at 05/08/11 1130 Last data filed at 05/08/11 0806  Gross per 24 hour  Intake 796.67 ml  Output   1975 ml  Net -1178.33 ml    PHYSICAL EXAM: Gen Exam: Awake and alert with clear speech.   Neck: Supple, No JVD.   Chest: B/L Clear.  CVS: S1 S2 Regular, no murmurs. Abdomen: soft, BS +, non tender, non distended.  Extremities: no edema, lower extremities warm to touch. Neurologic: Non Focal.   Skin: No Rash.   Wounds: N/A.    CONSULTS:  GI and vascular surgery  LAB RESULTS: CBC  Lab 05/08/11 0929 05/08/11 0308 05/07/11 0534 05/07/11 0522  WBC -- 10.3 -- 9.6  HGB 7.7* 7.3* 7.5* 7.0*  HCT 23.1* 21.9* 22.0* 21.5*  PLT -- 275 -- 336  MCV -- 86.9 -- 90.7  MCH -- 29.0 -- 29.5  MCHC -- 33.3 -- 32.6  RDW -- 21.4* -- 20.4*  LYMPHSABS -- -- -- --  MONOABS -- -- -- --  EOSABS -- -- -- --  BASOSABS -- -- -- --  BANDABS -- -- -- --    Chemistries   Lab 05/07/11 0823 05/07/11 0534  NA -- 140  K -- 3.4*  CL -- 106  CO2 -- --  GLUCOSE -- 119*  BUN -- 20  CREATININE -- 1.10  CALCIUM -- --  MG 2.0 --    GFR Estimated Creatinine Clearance: 111.5 ml/min (by C-G formula based on Cr of 1.1).  Coagulation profile  Lab  05/08/11 0929 05/08/11 0307 05/07/11 0643  INR 2.15* 1.77* >10.00*  PROTIME -- -- --    Cardiac Enzymes No results found for this basename: CK:3,CKMB:3,TROPONINI:3,MYOGLOBIN:3 in the last 168 hours  No components found with this basename: POCBNP:3 No results found for this basename: DDIMER:2 in the last 72 hours No results found for this basename: HGBA1C:2 in the last 72 hours No results found for this basename: CHOL:2,HDL:2,LDLCALC:2,TRIG:2,CHOLHDL:2,LDLDIRECT:2 in the last 72 hours No results found for this basename: TSH,T4TOTAL,FREET3,T3FREE,THYROIDAB in the last 72 hours  Basename 05/07/11 1025  VITAMINB12 245  FOLATE 9.2  FERRITIN 36  TIBC 236  IRON 21*  RETICCTPCT 5.6*   No results found for this basename: LIPASE:2,AMYLASE:2 in the last 72 hours  Urine Studies No results found for this basename: UACOL:2,UAPR:2,USPG:2,UPH:2,UTP:2,UGL:2,UKET:2,UBIL:2,UHGB:2,UNIT:2,UROB:2,ULEU:2,UEPI:2,UWBC:2,URBC:2,UBAC:2,CAST:2,CRYS:2,UCOM:2,BILUA:2 in the last 72 hours  MICROBIOLOGY: No results found for this or any previous visit (from the past 240 hour(s)).  RADIOLOGY STUDIES/RESULTS: Ct Abdomen Pelvis Wo Contrast  05/08/2011  *RADIOLOGY REPORT*  Clinical Data: Leg numbness.  Anemia.  Rule out retroperitoneal bleed.  CT ABDOMEN AND PELVIS WITHOUT CONTRAST  Technique:    Multidetector CT imaging of the abdomen and pelvis was performed following the standard protocol without intravenous contrast.  Comparison: CT pelvis 05/07/2011.  CT abdomen and pelvis 08/20/2006  Findings: Slight fibrosis in the lung bases.  Small esophageal hiatal hernia.  Circumscribed cyst in the anterior right lobe of the liver is stable since the prior study.  Unenhanced appearance of the liver, spleen, gallbladder, pancreas, and adrenal glands is otherwise unremarkable.  There is increased density in the renal collecting systems, ureters, and bladder bilaterally suggesting previous contrast administration.  Mild  calcification of the abdominal aorta.  Bilateral iliac artery aneurysms, measuring 3.2 cm on the right and 3.6 cm on the left.  There is infiltration around the right iliac artery aneurysm and along the iliopsoas muscle suggesting edema, inflammation, or hematoma, possibly due to rupture.  Asymmetric enlargement of the right piriformis muscle with a vague low attenuation change.  This might represent intramuscular fluid collection or hematoma. Moderate sized umbilical hernia containing fat.  No free air or free fluid in the abdomen.  Stomach, small bowel, and colon are not distended.  Pelvis:  Prostate gland is not enlarged.  Bladder wall is not thickened.  No significant pelvic lymphadenopathy.  Degenerative changes in the lumbar spine with normal alignment.  IMPRESSION: Infiltration around the right iliopsoas muscle with asymmetric enlargement of the iliopsoas and piriformis muscles.  Suggestion of low attenuation change in the right piriformis muscle.  Changes may represent inflammatory edema or hematoma.  There is a right iliac artery aneurysm and rupture is not excluded.  There is also a left iliac artery aneurysm.  Results discussed by telephone at the time of dictation, 0209 hours on 05/08/2011 with  the patient's nurse, Sonya,  who aknowledged these results and will contact the referring physician.  Original Report Authenticated By: WILLIAM R. STEVENS, M.D.   Dg Chest 1 View  04/16/2011  *RADIOLOGY REPORT*  Clinical Data: [employer] study.  CHEST - 1 VIEW  Comparison: 05/01/2010  Findings: Heart and mediastinal contours are within normal limits. No focal opacities or effusions.  No acute bony abnormality.  IMPRESSION: No active cardiopulmonary disease.  Original Report Authenticated By: KEVIN G. DOVER, M.D.   Dg Chest 2 View  05/07/2011  *RADIOLOGY REPORT*  Clinical Data: Right thigh pain since 10:00 a.m.  No known injury. Shortness of breath.  Numbness.  CHEST - 2 VIEW  Comparison: 04/16/2011   Findings: Shallow inspiration.  Mild cardiac enlargement.  Normal pulmonary vascularity.  No focal airspace consolidation or edema. No blunting of the costophrenic angles.  No pneumothorax. Degenerative changes in the thoracic spine.  No significant change since previous study.  IMPRESSION: No evidence of active pulmonary disease.  Original Report Authenticated By: WILLIAM R. STEVENS, M.D.   Ct Head Wo Contrast  05/07/2011  *RADIOLOGY REPORT*  Clinical Data: Right leg numbness. Light bothers eyes.  CT HEAD WITHOUT CONTRAST  Technique:  Contiguous axial images were obtained from the base of the skull through the vertex without contrast.  Comparison: None.  Findings: The ventricles and sulci appear symmetrical.  No mass effect or midline shift.  No abnormal extra-axial fluid collections.  Vague focal area of low attenuation in the left parietal white matter posterior to the sylvian fissure.  This may represent partial voluming with the posterior horn of the left lateral ventricle but a focal area of early ischemia can also have this appearance.  No evidence of acute intracranial hemorrhage. Gray-white matter junctions are distinct.  Basal cisterns are not   effaced.  No depressed skull fractures.  Vascular calcifications. Visualized paranasal sinuses and mastoid air cells are not opacified.  Old nasal bone fracture.  IMPRESSION: Nonspecific low attenuation area in the left posterior parietal white matter which could represent early ischemic focus versus partial voluming.  No significant mass effect.  No acute intracranial hemorrhage.  Original Report Authenticated By: WILLIAM R. STEVENS, M.D.   Ct Femur Right W Contrast  05/07/2011  *RADIOLOGY REPORT*  Clinical Data: Right thigh pain.  History of abscess.  CT OF THE RIGHT LOWER EXTREMITY WITH CONTRAST  Contrast: 100mL OMNIPAQUE IOHEXOL 300 MG/ML  SOLN  Comparison: CT abdomen and pelvis 08/20/2006.  Findings: No abscess is identified.  There is infiltration of fat  about the iliopsoas muscle belly and the muscle appears mildly edematous.  Ossification of the iliopsoas tendon at the lesser trochanter is compatible with old injury/enthesopathic change. Visualized muscles and tendons otherwise are normal in appearance. Subcutaneous fatty tissues are normal in appearance.  No mass is identified.  No bony destructive change or fracture is seen.  Marked appearing degenerative disease about the right knee is noted with a small joint effusion is seen. There is a loose body in the posterior aspect of the joint measuring 1.5 cm in diameter.  Imaged intrapelvic contents demonstrate a right external iliac node measuring 1.5 cm short axis dimension on image 8.  IMPRESSION:  1.  Negative for abscess. 2.  Infiltration of fat about the iliopsoas muscle in the proximal thigh with some edematous change within the muscle belly could be due to infectious or inflammatory change.  Muscle strain could create a similar appearance.  Enthesopathic change of the iliopsoas tendon is again noted. 3.  Small right external iliac lymph node is likely reactive. 4.  Advanced degenerative disease about the right knee.  Original Report Authenticated By: THOMAS L. D'ALESSIO, M.D.   Dg Shoulder Left  05/01/2011  *RADIOLOGY REPORT*  Clinical Data: Pain post crushing injury  LEFT SHOULDER - 2+ VIEW  Comparison: None.  Findings: Three views of the left shoulder submitted.  No acute fracture or subluxation.  No radiopaque foreign body.  IMPRESSION: No acute fracture or subluxation.  Original Report Authenticated By: LIVIU POP, M.D.   Dg Humerus Left  05/01/2011  *RADIOLOGY REPORT*  Clinical Data: Injured left arm and shoulder approximately 1 week ago while pulling in self out of a car.  Patient unable to adduct the left arm above chest level.  LEFT HUMERUS - 2+ VIEW 05/01/2011:  Comparison: Left shoulder x-rays obtained concurrently.  Findings: No evidence of acute, subacute, or healed fractures.  No intrinsic  osseous abnormalities involving the humerus.  Well- preserved bone mineral density.  Visualized elbow joint intact.  IMPRESSION: Normal examination.  Original Report Authenticated By: THOMAS E. LAWRENCE, M.D.   Ct Angio Abd/pel W/ And/or W/o  05/08/2011  *RADIOLOGY REPORT*  Clinical Data: Left leg weakness and pain.  Known iliac aneurysms with infiltration or hematoma in the right iliopsoas region on prior unenhanced CT.  Decreasing hemoglobin with multiple transfusions.  CT ANGIOGRAPHY ABDOMEN AND PELVIS WITH CONTRAST AND WITHOUT CONTRAST  Comparison: Unenhanced CT 05/08/2011  Findings: The study is technically limited due to the patient's body habitus.  There is overlying artifact centered at the area of interest.  This limits visualization of this area.  However, there appears to be flow throughout the right and left iliac artery aneurysms without evidence of thrombosis or occlusion.  There is no obvious contrast extravasation although a   small focus of active hemorrhage could be obscured.  Again demonstrated is a infiltration or hematoma around the right iliac artery aneurysm and in the iliopsoas recent extending down to the growing and in the piriformis muscles as before.  No significant increase.  The lung bases are clear.  Cyst again demonstrated in the liver. Cyst in the right kidney.  No hydronephrosis.  Normal homogeneous parenchymal nephrograms.  The gallbladder, pancreas, spleen, adrenal glands, the stomach, small bowel, colon, and retroperitoneal lymph nodes are unremarkable.  Small esophageal hiatal hernia.  Calcification of the aorta.  Broad-based umbilical hernia containing fat.  Pelvis:  Bladder wall is not thickened.  No significant pelvic lymphadenopathy.  No evidence of loculated fluid collection.  The appendix is normal.  IMPRESSION: Technically limited study due to the patient's body habitus. Bilateral iliac artery aneurysms are again demonstrated with infiltration or hematoma in the fat  around the right iliac aneurysm and in the iliopsoas and piriformis muscles.  No definite evidence of active contrast extravasation.  Intermittent or low grade leakage is not excluded.  Original Report Authenticated By: WILLIAM R. STEVENS, M.D.    MEDICATIONS: Scheduled Meds:   . acetaminophen  650 mg Oral Once  . antiseptic oral rinse  15 mL Mouth Rinse q12n4p  . atorvastatin  10 mg Oral q1800  . chlorhexidine  15 mL Mouth Rinse BID  . digoxin  125 mcg Oral Daily  . diphenhydrAMINE  25 mg Intravenous Once  . fluocinonide ointment  1 application Topical BID  . furosemide  20 mg Intravenous Once  . hydrocortisone cream   Topical BID  . potassium chloride  40 mEq Oral BID  . sodium chloride  3 mL Intravenous Q12H  . triamcinolone ointment  1 application Topical Daily  . DISCONTD: diltiazem  360 mg Oral Daily  . DISCONTD: simvastatin  40 mg Oral q1800   Continuous Infusions:   . sodium chloride 100 mL/hr at 05/08/11 0815   PRN Meds:.acetaminophen, acetaminophen, iohexol, morphine, ondansetron (ZOFRAN) IV, ondansetron, oxyCODONE, oxyCODONE-acetaminophen, sodium chloride, DISCONTD: sodium chloride, DISCONTD: sodium chloride, DISCONTD: oxyCODONE-acetaminophen  Antibiotics: Anti-infectives    None      Assessment/Plan: Patient Active Hospital Problem List: Right leg numbness /pain -is likely secondary to the infiltration/hematoma seen around the right iliac artery aneurysm -has no overt weakness, cant move right leg-but this is due to pain! -left lower ext is normal and pain free   Supratherapeutic INR  -patient claims no recent dose changes, denies any antibiotic use as well -INR significantly down today-after Vit K and 2 units of FFP -Will give another unit of FFP this am as INR >2  Anemia -from blood loss -this is likely GI bleed-from supra-therapeutic INR -will need GI work up prior to recommencing anticoagulation,have spoken with Dr Hung for possible EGD today - This is  his second episode of bleeding, had a significant hematoma in left lower ext 2008  PAF -hold cardizem as BP soft -continue with Digoxin  HTN (hypertension)  -currently stable off meds  H/O DVT/PE -on chronic coumadin today  Disposition: Remain inpatient  DVT Prophylaxis: SCD's  Code Status Full Code  Hasten Sweitzer M Yaileen Hofferber, MD. 05/08/2011, 11:30 AM    

## 2011-05-08 NOTE — Clinical Documentation Improvement (Signed)
Anemia Blood Loss Clarification  THIS DOCUMENT IS NOT A PERMANENT PART OF THE MEDICAL RECORD  RESPOND TO THE THIS QUERY, FOLLOW THE INSTRUCTIONS BELOW:  1. If needed, update documentation for the patient's encounter via the notes activity.  2. Access this query again and click edit on the In [employer].  3. After updating, or not, click F2 to complete all highlighted (required) fields concerning your review. Select "additional documentation in the medical record" OR "no additional documentation provided".  4. Click Sign note button.  5. The deficiency will fall out of your In Basket *Please let us know if you are not able to complete this workflow by phone or e-mail (listed below).        05/08/11  Dear Dr. Werner [facility]  In an effort to better capture your patient's severity of illness, reflect appropriate length of stay and utilization of resources, a review of the patient medical record has revealed the following indicators.    Based on your clinical judgment, please clarify and document in a progress note and/or discharge summary the clinical condition associated with the following supporting information:  In responding to this query please exercise your independent judgment.  The fact that a query is asked, does not imply that any particular answer is desired or expected.  Please clarify the acuity of blood loss anemia in Progress Notes and Discharge Summary  Possible Clinical Conditions  " Acute Blood Loss Anemia  " Acute on chronic blood loss anemia  " Chronic blood loss anemia  " Other Condition  " Cannot Clinically Determine   Supporting Information:  Risk Factors:  Chronic Coumadin therapy for Hx DVT, A-Fib  Signs and Symptoms  "He does relate a large melanotic stool yesterday" per notes  Diagnostics: 4-25 PT > 90.00/ INR >10.00  4-25 Hgb 7.0/Hct 21.5              4/26 PT 24.4/INR 2.15 4/26 Hgb 7.7/Hct 23.1  Treatments: 1 unit  FFP 1unit PRBC Vitamin K Monitoring of labs and assessment of pt   Reviewed: additional documentation in the medical record  Thank You,   Rossie Muskrat RN, BSN Clinical Documentation Specialist Pager:  [phone]  [email or web address]  [facility]

## 2011-05-09 ENCOUNTER — Encounter (HOSPITAL_COMMUNITY)
Admission: EM | Disposition: A | Discharge status: Skilled Nursing Facility | Payer: Self-pay | Source: Home / Self Care | Attending: Internal Medicine

## 2011-05-09 DIAGNOSIS — D649 Anemia, unspecified: Secondary | ICD-10-CM

## 2011-05-09 DIAGNOSIS — M79609 Pain in unspecified limb: Secondary | ICD-10-CM

## 2011-05-09 DIAGNOSIS — K921 Melena: Secondary | ICD-10-CM

## 2011-05-09 LAB — COMPREHENSIVE METABOLIC PANEL
ALT: 13 U/L (ref 0–53)
BUN: 10 mg/dL (ref 6–23)
CO2: 25 mEq/L (ref 19–32)
Calcium: 8.4 mg/dL (ref 8.4–10.5)
Creatinine, Ser: 0.86 mg/dL (ref 0.50–1.35)
GFR calc Af Amer: >90 mL/min (ref >90)
GFR calc non Af Amer: >90 mL/min (ref >90)
Glucose, Bld: 91 mg/dL (ref 70–99)
Total Protein: 6.8 g/dL (ref 6.0–8.3)

## 2011-05-09 LAB — CBC
HCT: 25.6 % — ABNORMAL LOW (ref 39.0–52.0)
Hemoglobin: 8.2 g/dL — ABNORMAL LOW (ref 13.0–17.0)
Hemoglobin: 8.4 g/dL — ABNORMAL LOW (ref 13.0–17.0)
MCHC: 32.9 g/dL (ref 30.0–36.0)
MCHC: 32.8 g/dL (ref 30.0–36.0)
RBC: 2.92 MIL/uL — ABNORMAL LOW (ref 4.22–5.81)
RDW: 20.3 % — ABNORMAL HIGH (ref 11.5–15.5)
WBC: 11.4 10*3/uL — ABNORMAL HIGH (ref 4.0–10.5)
WBC: 9.6 10*3/uL (ref 4.0–10.5)

## 2011-05-09 LAB — PREPARE FRESH FROZEN PLASMA

## 2011-05-09 LAB — TYPE AND SCREEN
Unit division: 0
Unit division: 0

## 2011-05-09 LAB — PROTIME-INR
INR: 2.51 — ABNORMAL HIGH (ref 0.00–1.49)
Prothrombin Time: 27.5 seconds — ABNORMAL HIGH (ref 11.6–15.2)

## 2011-05-09 SURGERY — COLONOSCOPY
Anesthesia: Moderate Sedation

## 2011-05-09 MED ORDER — MAGNESIUM CITRATE PO SOLN
1.0000 | Freq: Once | ORAL | Status: AC
  Administered 2011-05-09: 1 via ORAL
  Filled 2011-05-09: qty 296

## 2011-05-09 MED ORDER — METOPROLOL TARTRATE 1 MG/ML IV SOLN
5.0000 mg | Freq: Once | INTRAVENOUS | Status: AC
  Administered 2011-05-10: 5 mg via INTRAVENOUS
  Filled 2011-05-09: qty 5

## 2011-05-09 NOTE — Progress Notes (Signed)
Patient ID: [name], male   DOB: March 09, 1950, 61 y.o.   MRN: [id number] [facility] Progress Note  Subjective:  No active bleeding, he was unable to finish colon prep by earlier this am, so colonoscopy has been rescheduled for am Sunday. He has since finished the prep, but has only had one BM so far  Objective:  Vital signs in last 24 hours: Temp:  [98 F (36.7 C)-99.2 F (37.3 C)] 98.8 F (37.1 C) (04/27 0514) Pulse Rate:  [80-96] 80  (04/27 1112) Resp:  [16-41] 20  (04/27 0514) BP: (101-183)/(52-86) 136/84 mmHg (04/27 0514) SpO2:  [94 %-100 %] 94 % (04/27 0514) Weight:  [343 lb (155.584 kg)] 343 lb (155.584 kg) (04/26 1213) Last BM Date: 05/06/11 General:   Alert,  Well-developed,    in NAD Heart:  Regular rate and rhythm; no murmurs Pulm;clear Abdomen:  Soft, obese, nontender and nondistended. Normal bowel sounds, without guarding, and without rebound.   Extremities:  Without edema. Neurologic:  Alert and  oriented x4;  grossly normal neurologically. Psych:  Alert and cooperative. Normal mood and affect.  Intake/Output from previous day: 04/26 0701 - 04/27 0700 In: 581.3 [I.V.:250; Blood:331.3] Out: 1400 [Urine:1400] Intake/Output this shift:    Lab Results:  Basename 05/09/11 0703 05/08/11 2328 05/08/11 0929 05/08/11 0308  WBC 9.6 11.4* -- 10.3  HGB 8.4* 8.2* 7.7* --  HCT 25.6* 24.9* 23.1* --  PLT 310 294 -- 275   BMET  Basename 05/09/11 0703 05/07/11 0534  NA 139 140  K 3.7 3.4*  CL 102 106  CO2 25 --  GLUCOSE 91 119*  BUN 10 20  CREATININE 0.86 1.10  CALCIUM 8.4 --   LFT  Basename 05/09/11 0703  PROT 6.8  ALBUMIN 2.8*  AST 26  ALT 13  ALKPHOS 88  BILITOT 0.7  BILIDIR --  IBILI --   PT/INR  Basename 05/09/11 0703 05/08/11 0929  LABPROT 27.5* 24.4*  INR 2.51* 2.15*    Assessment / Plan: #1 61 yo male with melena in setting of markedly elevated INR. Negative EGD Pt is for colonoscopy - rescheduled for Sunday am with Dr.  Arlyce Dice.  He has finished prep, will leave on clears today and give a bottle of mag citrate this evening #2 coagulopathy- INR still  2.5  Today #3 anemia -secondary to above #4 DVT #5 morbid obesity. Principal Problem:  *Right leg numbness Active Problems:  Supratherapeutic INR  HTN (hypertension)  Melena  Anemia  Hypokalemia  History of DVT (deep vein thrombosis)     LOS: 2 days   [name]  05/09/2011, 11:45 AM

## 2011-05-09 NOTE — Progress Notes (Signed)
Pts. Heart rate elevated 130s-170s.  Pt. A-fib/flutter on monitor.  Baseline HR 90s-100s a-fib/flutter.  Pt. Asymptomatic, no complaints of chest pain.  Pt. Laying in bed watching television.  BP 152/79.  Elray Mcgregor, PA made aware.  See new orders.  Will continue to monitor.

## 2011-05-09 NOTE — Progress Notes (Addendum)
PATIENT DETAILS Name: [name] Age: 61 y.o. Sex: male Date of Birth: 1950-01-23 Admit Date: 05/07/2011 PCP:No primary provider on file.  Subjective: Still with some right thigh pain-but better than yesterday No melena overnight  Objective: Vital signs in last 24 hours: Filed Vitals:   05/08/11 2025 05/08/11 2155 05/09/11 0514 05/09/11 1112  BP: 120/72 143/79 136/84   Pulse: 81 85 86 80  Temp: 98.2 F (36.8 C) 98.5 F (36.9 C) 98.8 F (37.1 C)   TempSrc: Oral Oral Oral   Resp: 18 18 20    Height:      Weight:      SpO2:  98% 94%     Weight change: -0.726 kg (-1 lb 9.6 oz)  Body mass index is 44.04 kg/(m^2).  Intake/Output from previous day:  Intake/Output Summary (Last 24 hours) at 05/09/11 1128 Last data filed at 05/09/11 0300  Gross per 24 hour  Intake 581.25 ml  Output    600 ml  Net -18.75 ml    PHYSICAL EXAM: Gen Exam: Awake and alert with clear speech.   Neck: Supple, No JVD.   Chest: B/L Clear.  CVS: S1 S2 Regular, no murmurs. Abdomen: soft, BS +, non tender, non distended.  Extremities: no edema, lower extremities warm to touch. Neurologic: Non Focal.   Skin: No Rash.   Wounds: N/A.    CONSULTS:  GI and vascular surgery  LAB RESULTS: CBC  Lab 05/09/11 0703 05/08/11 2328 05/08/11 0929 05/08/11 0308 05/07/11 0534 05/07/11 0522  WBC 9.6 11.4* -- 10.3 -- 9.6  HGB 8.4* 8.2* 7.7* 7.3* 7.5* --  HCT 25.6* 24.9* 23.1* 21.9* 22.0* --  PLT 310 294 -- 275 -- 336  MCV 87.7 86.5 -- 86.9 -- 90.7  MCH 28.8 28.5 -- 29.0 -- 29.5  MCHC 32.8 32.9 -- 33.3 -- 32.6  RDW 20.5* 20.3* -- 21.4* -- 20.4*  LYMPHSABS -- -- -- -- -- --  MONOABS -- -- -- -- -- --  EOSABS -- -- -- -- -- --  BASOSABS -- -- -- -- -- --  BANDABS -- -- -- -- -- --    Chemistries   Lab 05/09/11 0703 05/07/11 0823 05/07/11 0534  NA 139 -- 140  K 3.7 -- 3.4*  CL 102 -- 106  CO2 25 -- --  GLUCOSE 91 -- 119*  BUN 10 -- 20  CREATININE 0.86 -- 1.10  CALCIUM 8.4 -- --  MG -- 2.0 --     GFR Estimated Creatinine Clearance: 142.4 ml/min (by C-G formula based on Cr of 0.86).  Coagulation profile  Lab 05/09/11 0703 05/08/11 0929 05/08/11 0307 05/07/11 0643  INR 2.51* 2.15* 1.77* >10.00*  PROTIME -- -- -- --    Cardiac Enzymes No results found for this basename: CK:3,CKMB:3,TROPONINI:3,MYOGLOBIN:3 in the last 168 hours  No components found with this basename: POCBNP:3 No results found for this basename: DDIMER:2 in the last 72 hours No results found for this basename: HGBA1C:2 in the last 72 hours No results found for this basename: CHOL:2,HDL:2,LDLCALC:2,TRIG:2,CHOLHDL:2,LDLDIRECT:2 in the last 72 hours No results found for this basename: TSH,T4TOTAL,FREET3,T3FREE,THYROIDAB in the last 72 hours  Basename 05/07/11 1025  VITAMINB12 245  FOLATE 9.2  FERRITIN 36  TIBC 236  IRON 21*  RETICCTPCT 5.6*   No results found for this basename: LIPASE:2,AMYLASE:2 in the last 72 hours  Urine Studies No results found for this basename: UACOL:2,UAPR:2,USPG:2,UPH:2,UTP:2,UGL:2,UKET:2,UBIL:2,UHGB:2,UNIT:2,UROB:2,ULEU:2,UEPI:2,UWBC:2,URBC:2,UBAC:2,CAST:2,CRYS:2,UCOM:2,BILUA:2 in the last 72 hours  MICROBIOLOGY: No results found for this or any previous visit (from  the past 240 hour(s)).  RADIOLOGY STUDIES/RESULTS: Ct Abdomen Pelvis Wo Contrast  05/08/2011  *RADIOLOGY REPORT*  Clinical Data: Leg numbness.  Anemia.  Rule out retroperitoneal bleed.  CT ABDOMEN AND PELVIS WITHOUT CONTRAST  Technique:  Multidetector CT imaging of the abdomen and pelvis was performed following the standard protocol without intravenous contrast.  Comparison: CT pelvis 05/07/2011.  CT abdomen and pelvis 08/20/2006  Findings: Slight fibrosis in the lung bases.  Small esophageal hiatal hernia.  Circumscribed cyst in the anterior right lobe of the liver is stable since the prior study.  Unenhanced appearance of the liver, spleen, gallbladder, pancreas, and adrenal glands is otherwise unremarkable.  There  is increased density in the renal collecting systems, ureters, and bladder bilaterally suggesting previous contrast administration.  Mild calcification of the abdominal aorta.  Bilateral iliac artery aneurysms, measuring 3.2 cm on the right and 3.6 cm on the left.  There is infiltration around the right iliac artery aneurysm and along the iliopsoas muscle suggesting edema, inflammation, or hematoma, possibly due to rupture.  Asymmetric enlargement of the right piriformis muscle with a vague low attenuation change.  This might represent intramuscular fluid collection or hematoma. Moderate sized umbilical hernia containing fat.  No free air or free fluid in the abdomen.  Stomach, small bowel, and colon are not distended.  Pelvis:  Prostate gland is not enlarged.  Bladder wall is not thickened.  No significant pelvic lymphadenopathy.  Degenerative changes in the lumbar spine with normal alignment.  IMPRESSION: Infiltration around the right iliopsoas muscle with asymmetric enlargement of the iliopsoas and piriformis muscles.  Suggestion of low attenuation change in the right piriformis muscle.  Changes may represent inflammatory edema or hematoma.  There is a right iliac artery aneurysm and rupture is not excluded.  There is also a left iliac artery aneurysm.  Results discussed by telephone at the time of dictation, 0209 hours on 05/08/2011 with  the patient's nurse, Lamar Laundry,  who aknowledged these results and will contact the referring physician.  Original Report Authenticated By: Marlon Pel, M.D.   Dg Chest 1 View  04/16/2011  *RADIOLOGY REPORT*  Clinical Data: [employer] study.  CHEST - 1 VIEW  Comparison: 05/01/2010  Findings: Heart and mediastinal contours are within normal limits. No focal opacities or effusions.  No acute bony abnormality.  IMPRESSION: No active cardiopulmonary disease.  Original Report Authenticated By: Cyndie Chime, M.D.   Dg Chest 2 View  05/07/2011  *RADIOLOGY REPORT*  Clinical  Data: Right thigh pain since 10:00 a.m.  No known injury. Shortness of breath.  Numbness.  CHEST - 2 VIEW  Comparison: 04/16/2011  Findings: Shallow inspiration.  Mild cardiac enlargement.  Normal pulmonary vascularity.  No focal airspace consolidation or edema. No blunting of the costophrenic angles.  No pneumothorax. Degenerative changes in the thoracic spine.  No significant change since previous study.  IMPRESSION: No evidence of active pulmonary disease.  Original Report Authenticated By: Marlon Pel, M.D.   Ct Head Wo Contrast  05/07/2011  *RADIOLOGY REPORT*  Clinical Data: Right leg numbness. Light bothers eyes.  CT HEAD WITHOUT CONTRAST  Technique:  Contiguous axial images were obtained from the base of the skull through the vertex without contrast.  Comparison: None.  Findings: The ventricles and sulci appear symmetrical.  No mass effect or midline shift.  No abnormal extra-axial fluid collections.  Vague focal area of low attenuation in the left parietal white matter posterior to the sylvian fissure.  This may represent partial  voluming with the posterior horn of the left lateral ventricle but a focal area of early ischemia can also have this appearance.  No evidence of acute intracranial hemorrhage. Gray-white matter junctions are distinct.  Basal cisterns are not effaced.  No depressed skull fractures.  Vascular calcifications. Visualized paranasal sinuses and mastoid air cells are not opacified.  Old nasal bone fracture.  IMPRESSION: Nonspecific low attenuation area in the left posterior parietal white matter which could represent early ischemic focus versus partial voluming.  No significant mass effect.  No acute intracranial hemorrhage.  Original Report Authenticated By: Marlon Pel, M.D.   Ct Femur Right W Contrast  05/07/2011  *RADIOLOGY REPORT*  Clinical Data: Right thigh pain.  History of abscess.  CT OF THE RIGHT LOWER EXTREMITY WITH CONTRAST  Contrast: OMNIPAQUE IOHEXOL  300 MG/ML  SOLN  Comparison: CT abdomen and pelvis 08/20/2006.  Findings: No abscess is identified.  There is infiltration of fat about the iliopsoas muscle belly and the muscle appears mildly edematous.  Ossification of the iliopsoas tendon at the lesser trochanter is compatible with old injury/enthesopathic change. Visualized muscles and tendons otherwise are normal in appearance. Subcutaneous fatty tissues are normal in appearance.  No mass is identified.  No bony destructive change or fracture is seen.  Marked appearing degenerative disease about the right knee is noted with a small joint effusion is seen. There is a loose body in the posterior aspect of the joint measuring 1.5 cm in diameter.  Imaged intrapelvic contents demonstrate a right external iliac node measuring 1.5 cm short axis dimension on image 8.  IMPRESSION:  1.  Negative for abscess. 2.  Infiltration of fat about the iliopsoas muscle in the proximal thigh with some edematous change within the muscle belly could be due to infectious or inflammatory change.  Muscle strain could create a similar appearance.  Enthesopathic change of the iliopsoas tendon is again noted. 3.  Small right external iliac lymph node is likely reactive. 4.  Advanced degenerative disease about the right knee.  Original Report Authenticated By: Bernadene Bell. D'ALESSIO, M.D.   Dg Shoulder Left  05/01/2011  *RADIOLOGY REPORT*  Clinical Data: Pain post crushing injury  LEFT SHOULDER - 2+ VIEW  Comparison: None.  Findings: Three views of the left shoulder submitted.  No acute fracture or subluxation.  No radiopaque foreign body.  IMPRESSION: No acute fracture or subluxation.  Original Report Authenticated By: Natasha Mead, M.D.   Dg Humerus Left  05/01/2011  *RADIOLOGY REPORT*  Clinical Data: Injured left arm and shoulder approximately 1 week ago while pulling in self out of a car.  Patient unable to adduct the left arm above chest level.  LEFT HUMERUS - 2+ VIEW 05/01/2011:   Comparison: Left shoulder x-rays obtained concurrently.  Findings: No evidence of acute, subacute, or healed fractures.  No intrinsic osseous abnormalities involving the humerus.  Well- preserved bone mineral density.  Visualized elbow joint intact.  IMPRESSION: Normal examination.  Original Report Authenticated By: Arnell Sieving, M.D.   Ct Angio Abd/pel W/ And/or W/o  05/08/2011  *RADIOLOGY REPORT*  Clinical Data: Left leg weakness and pain.  Known iliac aneurysms with infiltration or hematoma in the right iliopsoas region on prior unenhanced CT.  Decreasing hemoglobin with multiple transfusions.  CT ANGIOGRAPHY ABDOMEN AND PELVIS WITH CONTRAST AND WITHOUT CONTRAST  Comparison: Unenhanced CT 05/08/2011  Findings: The study is technically limited due to the patient's body habitus.  There is overlying artifact centered at the area  of interest.  This limits visualization of this area.  However, there appears to be flow throughout the right and left iliac artery aneurysms without evidence of thrombosis or occlusion.  There is no obvious contrast extravasation although a small focus of active hemorrhage could be obscured.  Again demonstrated is a infiltration or hematoma around the right iliac artery aneurysm and in the iliopsoas recent extending down to the growing and in the piriformis muscles as before.  No significant increase.  The lung bases are clear.  Cyst again demonstrated in the liver. Cyst in the right kidney.  No hydronephrosis.  Normal homogeneous parenchymal nephrograms.  The gallbladder, pancreas, spleen, adrenal glands, the stomach, small bowel, colon, and retroperitoneal lymph nodes are unremarkable.  Small esophageal hiatal hernia.  Calcification of the aorta.  Broad-based umbilical hernia containing fat.  Pelvis:  Bladder wall is not thickened.  No significant pelvic lymphadenopathy.  No evidence of loculated fluid collection.  The appendix is normal.  IMPRESSION: Technically limited study  due to the patient's body habitus. Bilateral iliac artery aneurysms are again demonstrated with infiltration or hematoma in the fat around the right iliac aneurysm and in the iliopsoas and piriformis muscles.  No definite evidence of active contrast extravasation.  Intermittent or low grade leakage is not excluded.  Original Report Authenticated By: Marlon Pel, M.D.    MEDICATIONS: Scheduled Meds:    . acetaminophen  650 mg Oral Once  . acetaminophen  650 mg Oral Once  . antiseptic oral rinse  15 mL Mouth Rinse q12n4p  . atorvastatin  10 mg Oral q1800  . chlorhexidine  15 mL Mouth Rinse BID  . digoxin  125 mcg Oral Daily  . diphenhydrAMINE  25 mg Intravenous Once  . fluocinonide ointment  1 application Topical BID  . furosemide  20 mg Intravenous Once  . hydrocortisone cream   Topical BID  . polyethylene glycol-electrolytes  4,000 mL Oral Once  . sodium chloride  3 mL Intravenous Q12H  . triamcinolone ointment  1 application Topical Daily  . DISCONTD: diphenhydrAMINE  25 mg Intravenous Once  . DISCONTD: furosemide  20 mg Intravenous Once   Continuous Infusions:    . sodium chloride 100 mL (05/09/11 0957)   PRN Meds:.acetaminophen, acetaminophen, morphine, ondansetron (ZOFRAN) IV, ondansetron, oxyCODONE, oxyCODONE-acetaminophen, sodium chloride, DISCONTD: fentaNYL, DISCONTD: midazolam  Antibiotics: Anti-infectives    None      Assessment/Plan: Patient Active Hospital Problem List: Right leg numbness /pain -is likely secondary to the infiltration/hematoma seen around the right iliac artery aneurysm -has no overt weakness, cant move right leg-but this is due to pain! -left lower ext is normal and pain free   Supratherapeutic INR  -patient claims no recent dose changes, denies any antibiotic use as well -INR still 2.51 after 3 units of FFP and Vit K, since no bleeding, will just let it drift down  Anemia -from acute blood loss -this is likely GI bleed-from  supra-therapeutic INR -will need GI work up prior to recommencing anticoagulation,have spoken with Dr Elnoria Howard for possible EGD today - This is his second episode of bleeding, had a significant hematoma in left lower ext 2008 -Hb up to 8.4 after 3 units of PRBC transfusion  GI Bleed -Likely upper given h/o melena -EGD 4/26-results noted, Colonoscopy today-but able to tolerate prep-await GI follow up to see if they would want to reschedule  PAF -hold cardizem as BP soft -continue with Digoxin  HTN (hypertension)  -currently stable off meds  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 05/09/2011, 11:28 AM

## 2011-05-09 NOTE — Evaluation (Addendum)
Physical Therapy Evaluation Patient Details Name: [name] MRN: [id number] DOB: [birth year] Today's Date: 05/09/2011 Time: 7829-5621 PT Time Calculation (min): 46 min  PT Assessment / Plan / Recommendation Clinical Impression  61 year old male admitted to the hospital with leg weakness and pain. Pt reporting he got weak at home and fell, reports no back or hip pain however has decreased sensation over anterior thigh and knee. Pt very weak and unable to extend Rt. knee against gravity - uncertain of etiology. Sitting EOB pt's HR fluctuated from 110s to 150s and twice to 160s. Pt fatigued just sitting EOB, secondary to safety concerns for cardiac and Rt. LE strength standing not attempted today. Returned to bed and performed exercises attempting to get Rt. quad firing. Pt will need SNF as he lives home alone, no assist available and will not be able to safely care for himself.   PT will benefit from OT evaluation given Lt. UE deficits, ? More imagining for Rt. LE numbness and weakness.   PT Assessment  Patient needs continued PT services    Follow Up Recommendations  Supervision/Assistance - 24 hour;Skilled nursing facility    Equipment Recommendations  Defer to next venue    Frequency Min 3X/week    Precautions / Restrictions Precautions Precautions: Fall Precaution Comments: Large man, significant Rt. LE weakness, slight impulsiveness Restrictions Weight Bearing Restrictions: No   Pertinent Vitals/Pain Pt reporting 8-9/10 pain in Rt. Thigh although reports this is a "numb" pain.      Mobility  Bed Mobility Bed Mobility: Rolling Left;Left Sidelying to Sit;Sit to Sidelying Left;Sit to Supine;Scooting to [facility] Rolling Left: 4: Min guard Left Sidelying to Sit: 4: Min guard Sit to Supine: 3: Mod assist Sit to Sidelying Left: 3: Mod assist Scooting to HOB: 1: +2 Total assist Scooting to [facility]: Patient Percentage: 10% Details for Bed Mobility Assistance: Verbal cues for sequencing  and efficiency. Mod assist for Rt. LE for return to bed. Pt with impulsive like movement throughout, difficult to safely guard pt.   Transfers Transfers: Not assessed Details for Transfer Assistance: Sitting EOB pt's HR began to climb, although fluctuating began to consistently stay in 120-140s range. Reached 160s 2x. Given HR and 2/5 Rt. quad strength, standing not attempted today with +2 assist.  Ambulation/Gait Ambulation/Gait Assistance: Not tested (comment)    Exercises General Exercises - Lower Extremity Ankle Circles/Pumps: AROM;Both;20 reps;Supine Quad Sets: AAROM;Right;10 reps Long Arc Quad: AAROM;Right;5 reps;Seated Heel Slides: AAROM;Right;10 reps Hip ABduction/ADduction: AAROM;Right;10 reps;Supine   PT Goals Acute Rehab PT Goals PT Goal Formulation: With patient Time For Goal Achievement: 05/23/11 Potential to Achieve Goals: Good Pt will Roll Supine to Right Side: with modified independence PT Goal: Rolling Supine to Right Side - Progress: Goal set today Pt will Roll Supine to Left Side: with modified independence PT Goal: Rolling Supine to Left Side - Progress: Goal set today Pt will go Supine/Side to Sit: with modified independence PT Goal: Supine/Side to Sit - Progress: Goal set today Pt will go Sit to Supine/Side: with supervision PT Goal: Sit to Supine/Side - Progress: Goal set today Pt will go Sit to Stand: with max assist PT Goal: Sit to Stand - Progress: Goal set today Pt will go Stand to Sit: with max assist PT Goal: Stand to Sit - Progress: Goal set today Pt will Transfer Bed to Chair/Chair to Bed: with mod assist PT Transfer Goal: Bed to Chair/Chair to Bed - Progress: Goal set today Pt will Ambulate: 1 - 15 feet;with  mod assist PT Goal: Ambulate - Progress: Goal set today  Visit Information  Last PT Received On: 05/09/11 Assistance Needed: +3 or more    Subjective Data  Subjective: "I'm not going to be able to get up" Patient Stated Goal: Go back  home   Prior Functioning  Home Living Lives With: Alone Available Help at Discharge:  (none) Type of Home: House Home Access: Stairs to enter [employer] of Steps: 5 Entrance Stairs-Rails: Can reach both Home Layout: Able to live on main level with bedroom/bathroom;One level (with basement) Alternate Level Stairs-Number of Steps: 5 Alternate Level Stairs-Rails: Right Bathroom Shower/Tub: Tub/shower unit;Curtain Bathroom Toilet: Standard Bathroom Accessibility: Yes How Accessible: Accessible via walker Home Adaptive Equipment: None Prior Function Level of Independence: Independent Driving: Yes Vocation: On disability Comments: Disability for falling and hurting knees.     Cognition  Overall Cognitive Status: Impaired Area of Impairment: Safety/judgement;Problem solving Arousal/Alertness: Awake/alert Orientation Level: Appears intact for tasks assessed Behavior During Session: [facility] for tasks performed Safety/Judgement: Impulsive Safety/Judgement - Other Comments: Impulsive with bed mobility, difficult to safely guard.    Extremity/Trunk Assessment Left Upper Extremity Assessment LUE ROM/Strength/Tone: Deficits LUE ROM/Strength/Tone Deficits: Pt reported difficulty with elbow flexion, from prior injury on car door.  Right Lower Extremity Assessment RLE ROM/Strength/Tone: Deficits RLE ROM/Strength/Tone Deficits: WFL dorsiflexion, plantarflexion. knee flexion and extension ~2/5.  RLE Sensation: Deficits RLE Sensation Deficits: Significantly impaired Rt. quad down to knee numbness. Has returned sensation in lower Rt. leg.  Left Lower Extremity Assessment LLE ROM/Strength/Tone: WFL for tasks assessed LLE Sensation: WFL - Light Touch   Balance Balance Balance Assessed: Yes Static Sitting Balance Static Sitting - Balance Support: Bilateral upper extremity supported;Feet supported Static Sitting - Level of Assistance: 4: Min assist Static Sitting - Comment/# of  Minutes: min assist progressing to stand by assist for safety. Sitting EOB ~10 min for tolerance. HR with significant fluctuations.   End of Session PT - End of Session Equipment Utilized During Treatment: Gait belt Activity Tolerance: Patient limited by fatigue;Treatment limited secondary to medical complications (Comment) (Irregular HR) Patient left: in bed;with call bell/phone within reach Nurse Communication: Mobility status;Precautions;Other (comment) (cardiopulmonary status)   Rajiv, Parlato 05/09/2011, 5:08 PM  Sherie Don) Carleene Mains PT, DPT Acute Rehabilitation [phone]

## 2011-05-10 ENCOUNTER — Encounter (HOSPITAL_COMMUNITY)
Admission: EM | Disposition: A | Discharge status: Skilled Nursing Facility | Payer: Self-pay | Source: Home / Self Care | Attending: Internal Medicine

## 2011-05-10 DIAGNOSIS — Z7901 Long term (current) use of anticoagulants: Secondary | ICD-10-CM

## 2011-05-10 DIAGNOSIS — E785 Hyperlipidemia, unspecified: Secondary | ICD-10-CM | POA: Diagnosis present

## 2011-05-10 DIAGNOSIS — I4892 Unspecified atrial flutter: Secondary | ICD-10-CM | POA: Diagnosis present

## 2011-05-10 DIAGNOSIS — L409 Psoriasis, unspecified: Secondary | ICD-10-CM | POA: Diagnosis present

## 2011-05-10 DIAGNOSIS — I2699 Other pulmonary embolism without acute cor pulmonale: Secondary | ICD-10-CM | POA: Diagnosis not present

## 2011-05-10 LAB — CBC
HCT: 26.3 % — ABNORMAL LOW (ref 39.0–52.0)
Hemoglobin: 8.5 g/dL — ABNORMAL LOW (ref 13.0–17.0)
MCH: 28 pg (ref 26.0–34.0)
MCHC: 32.3 g/dL (ref 30.0–36.0)
MCV: 86.5 fL (ref 78.0–100.0)
Platelets: 328 10*3/uL (ref 150–400)
RBC: 3.04 MIL/uL — ABNORMAL LOW (ref 4.22–5.81)
RDW: 19.6 % — ABNORMAL HIGH (ref 11.5–15.5)
WBC: 9 10*3/uL (ref 4.0–10.5)

## 2011-05-10 LAB — BASIC METABOLIC PANEL
BUN: 9 mg/dL (ref 6–23)
CO2: 25 mEq/L (ref 19–32)
Calcium: 8.3 mg/dL — ABNORMAL LOW (ref 8.4–10.5)
Chloride: 104 mEq/L (ref 96–112)
Creatinine, Ser: 0.76 mg/dL (ref 0.50–1.35)
GFR calc Af Amer: >90 mL/min (ref >90)
GFR calc non Af Amer: >90 mL/min (ref >90)
Glucose, Bld: 95 mg/dL (ref 70–99)
Potassium: 3.1 mEq/L — ABNORMAL LOW (ref 3.5–5.1)
Sodium: 140 mEq/L (ref 135–145)

## 2011-05-10 LAB — PROTIME-INR
INR: 3.02 — ABNORMAL HIGH (ref 0.00–1.49)
Prothrombin Time: 31.8 seconds — ABNORMAL HIGH (ref 11.6–15.2)

## 2011-05-10 SURGERY — COLONOSCOPY
Anesthesia: Moderate Sedation

## 2011-05-10 MED ORDER — POTASSIUM CHLORIDE 10 MEQ/100ML IV SOLN
10.0000 meq | INTRAVENOUS | Status: AC
  Administered 2011-05-10 (×2): 10 meq via INTRAVENOUS
  Filled 2011-05-10 (×2): qty 100

## 2011-05-10 MED ORDER — METOPROLOL TARTRATE 25 MG PO TABS
25.0000 mg | ORAL_TABLET | Freq: Two times a day (BID) | ORAL | Status: DC
  Administered 2011-05-10 – 2011-05-14 (×8): 25 mg via ORAL
  Filled 2011-05-10 (×9): qty 1

## 2011-05-10 MED ORDER — DILTIAZEM HCL ER COATED BEADS 360 MG PO CP24
360.0000 mg | ORAL_CAPSULE | Freq: Every day | ORAL | Status: DC
  Administered 2011-05-10: 360 mg via ORAL
  Filled 2011-05-10: qty 1

## 2011-05-10 MED ORDER — ZOLPIDEM TARTRATE 5 MG PO TABS
5.0000 mg | ORAL_TABLET | Freq: Every evening | ORAL | Status: DC | PRN
  Administered 2011-05-11 – 2011-05-14 (×4): 5 mg via ORAL
  Filled 2011-05-10 (×4): qty 1

## 2011-05-10 MED ORDER — PHYTONADIONE 5 MG PO TABS
5.0000 mg | ORAL_TABLET | Freq: Once | ORAL | Status: AC
  Administered 2011-05-10: 5 mg via ORAL
  Filled 2011-05-10: qty 1

## 2011-05-10 MED ORDER — POTASSIUM CHLORIDE CRYS ER 20 MEQ PO TBCR
40.0000 meq | EXTENDED_RELEASE_TABLET | Freq: Once | ORAL | Status: AC
  Administered 2011-05-10: 40 meq via ORAL
  Filled 2011-05-10: qty 2

## 2011-05-10 MED ORDER — MAGNESIUM CITRATE PO SOLN
1.0000 | Freq: Once | ORAL | Status: AC
  Administered 2011-05-10: 1 via ORAL
  Filled 2011-05-10: qty 296

## 2011-05-10 MED ORDER — DILTIAZEM HCL ER COATED BEADS 360 MG PO CP24
360.0000 mg | ORAL_CAPSULE | Freq: Every day | ORAL | Status: DC
  Administered 2011-05-10 – 2011-05-15 (×6): 360 mg via ORAL
  Filled 2011-05-10 (×6): qty 1

## 2011-05-10 MED ORDER — DIGOXIN 250 MCG PO TABS
0.2500 mg | ORAL_TABLET | Freq: Every day | ORAL | Status: DC
  Administered 2011-05-11 – 2011-05-15 (×5): 0.25 mg via ORAL
  Filled 2011-05-10 (×5): qty 1

## 2011-05-10 NOTE — Consult Note (Signed)
Reason for Consult: Atrial flutter  Requesting Physician: [facility]  HPI: [name] with a past medical history significant for massive pulmonary embolism in 2008. At that time he also had PAF. He did require an I&D of his Lt leg a few weeks after discharge, (infected IV/central line access site). We saw him in Jan 2012 for atrial fib/flutter as referral from Dr Dorothyann Peng. 2D echo showed NL LVF, NL RVF and only mild TR. Myoview was low risk. He was set up for DCCV April 2012 but was in NSR and this was cancelled. He is admitted now with complaints of RLE numbness. CT shows bilat iliac aneurysms with Rt iliopsoas hematoma in the setting of an INR of 10. He is noted to be in PAF atrial flutter/artial fib with VR in the 100s.  He has GI procedures postponed due to persistently elevated INR. He occasionally notes "fluttering" in his chest, but denies CP or SOB.  No syncope, near syncope or dizziness, lightheadedness. No TIA or amaurosis Fugax.  He has not been seen @ [facility] in over a year - since Dr. Lynnea Ferrier left.  PMHx:  Past Medical History  Diagnosis Date  . Hypertension   . Chronic pain   . Obesity   . DVT (deep venous thrombosis)   . Dysrhythmia     atrial fibrilation  . Shortness of breath   . Blood transfusion 05/07/2011     " no reaction "  . Arthritis    Past Surgical History  Procedure Date  . Knee surgery     FAMHx: Family History  Problem Relation Age of Onset  . Cancer Mother     SOCHx:  reports that he has quit smoking. He has never used smokeless tobacco. He reports that he drinks alcohol. He reports that he does not use illicit drugs.  ALLERGIES: No Known Allergies  ROS:  Pertinent items are noted in HPI. Review of Systems - General ROS: negative Psychological ROS: no depressed mood, no change in sleep habits ENT ROS: negative Cardiovascular ROS: positive for - irregular heartbeat, palpitations and rapid heart rate negative for -  chest pain, dyspnea on exertion, edema, loss of consciousness, murmur, orthopnea, paroxysmal nocturnal dyspnea or shortness of breath Gastrointestinal ROS: positive for - blood in stools and melena negative for - abdominal pain, change in bowel habits, constipation, diarrhea, heartburn, hematemesis, nausea/vomiting or stool incontinence Genito-Urinary ROS: no dysuria, trouble voiding, or hematuria Musculoskeletal ROS: negative Neurological ROS: positive for - numbness/tingling and localized in R thigh negative for - confusion, dizziness, gait disturbance, headaches, memory loss, seizures or TIA, Amaurosis Fugax Dermatological ROS: positive for psoriasis patches   HOME MEDICATIONS: Prescriptions prior to admission  Medication Sig Dispense Refill  . desonide (DESOWEN) 0.05 % cream Apply 1 application topically daily. Apply to rash on face      . digoxin (LANOXIN) 0.125 MG tablet Take 125 mcg by mouth daily.      [name] diltiazem (CARDIZEM CD) 360 MG 24 hr capsule Take 360 mg by mouth daily.      . fluocinonide ointment (LIDEX) 0.05 % Apply 1 application topically 2 (two) times daily. Apply to dry skin areas on body      . oxyCODONE-acetaminophen (PERCOCET) 10-325 MG per tablet Take 1 tablet by mouth every 4 (four) hours as needed. pain      . pravastatin (PRAVACHOL) 40 MG tablet Take 40 mg by mouth daily.      [name] triamcinolone ointment (KENALOG)  0.1 % Apply 1 application topically daily. Apply to skin rash areas on body      . warfarin (COUMADIN) 5 MG tablet Take 15 mg by mouth daily.        HOSPITAL MEDICATIONS: I have reviewed the patient's current medications.  VITALS: Blood pressure 132/73, pulse 79, temperature 98.1 F (36.7 C), temperature source Oral, resp. rate 18, height 6\' 2"  (1.88 m), weight 155.584 kg (343 lb), SpO2 96.00%.  PHYSICAL EXAM: per Dr Herbie [place] General appearance: alert, cooperative, appears stated age, no distress, morbidly obese and normal mood & affect;  Pleasant Neck: no adenopathy, no carotid bruit, no JVD, supple, symmetrical, trachea midline and thyroid not enlarged, symmetric, no tenderness/mass/nodules Lungs: clear to auscultation bilaterally, normal percussion bilaterally and non-labored Heart: irregularly irregular rhythm, S1, S2 normal, no S3 or S4, no rub and no murmur; unable to clearly palpate PMI due to body habitus Abdomen: soft, non-tender; bowel sounds normal; no masses,  no organomegaly and morbidly obese Extremities: extremities normal, atraumatic, no cyanosis or edema and decreased sensation R thigh Pulses: 2+ and symmetric Skin: psoriatic rash on legs Neurologic: Grossly normal; CN II-XII grossly intact   LABS: Results for orders placed during the hospital encounter of 05/07/11 (from the past 48 hour(s))  CBC     Status: Abnormal   Collection Time   05/08/11 11:28 PM      Component Value Range Comment   WBC 11.4 (*) 4.0 - 10.5 (K/uL)    RBC 2.88 (*) 4.22 - 5.81 (MIL/uL)    Hemoglobin 8.2 (*) 13.0 - 17.0 (g/dL)    HCT 40.9 (*) 81.1 - 52.0 (%)    MCV 86.5  78.0 - 100.0 (fL)    MCH 28.5  26.0 - 34.0 (pg)    MCHC 32.9  30.0 - 36.0 (g/dL)    RDW 91.4 (*) 78.2 - 15.5 (%)    Platelets 294  150 - 400 (K/uL)   COMPREHENSIVE METABOLIC PANEL     Status: Abnormal   Collection Time   05/09/11  7:03 AM      Component Value Range Comment   Sodium 139  135 - 145 (mEq/L)    Potassium 3.7  3.5 - 5.1 (mEq/L)    Chloride 102  96 - 112 (mEq/L)    CO2 25  19 - 32 (mEq/L)    Glucose, Bld 91  70 - 99 (mg/dL)    BUN 10  6 - 23 (mg/dL)    Creatinine, Ser 9.56  0.50 - 1.35 (mg/dL)    Calcium 8.4  8.4 - 10.5 (mg/dL)    Total Protein 6.8  6.0 - 8.3 (g/dL)    Albumin 2.8 (*) 3.5 - 5.2 (g/dL)    AST 26  0 - 37 (U/L)    ALT 13  0 - 53 (U/L)    Alkaline Phosphatase 88  39 - 117 (U/L)    Total Bilirubin 0.7  0.3 - 1.2 (mg/dL)    GFR calc non Af Amer >90  >90 (mL/min)    GFR calc Af Amer >90  >90 (mL/min)   CBC     Status: Abnormal    Collection Time   05/09/11  7:03 AM      Component Value Range Comment   WBC 9.6  4.0 - 10.5 (K/uL)    RBC 2.92 (*) 4.22 - 5.81 (MIL/uL)    Hemoglobin 8.4 (*) 13.0 - 17.0 (g/dL)    HCT 21.3 (*) 08.6 - 52.0 (%)    MCV  87.7  78.0 - 100.0 (fL)    MCH 28.8  26.0 - 34.0 (pg)    MCHC 32.8  30.0 - 36.0 (g/dL)    RDW 16.1 (*) 09.6 - 15.5 (%)    Platelets 310  150 - 400 (K/uL)   PROTIME-INR     Status: Abnormal   Collection Time   05/09/11  7:03 AM      Component Value Range Comment   Prothrombin Time 27.5 (*) 11.6 - 15.2 (seconds)    INR 2.51 (*) 0.00 - 1.49    PROTIME-INR     Status: Abnormal   Collection Time   05/10/11  5:40 AM      Component Value Range Comment   Prothrombin Time 31.8 (*) 11.6 - 15.2 (seconds)    INR 3.02 (*) 0.00 - 1.49    CBC     Status: Abnormal   Collection Time   05/10/11  5:40 AM      Component Value Range Comment   WBC 9.0  4.0 - 10.5 (K/uL)    RBC 3.04 (*) 4.22 - 5.81 (MIL/uL)    Hemoglobin 8.5 (*) 13.0 - 17.0 (g/dL)    HCT 04.5 (*) 40.9 - 52.0 (%)    MCV 86.5  78.0 - 100.0 (fL)    MCH 28.0  26.0 - 34.0 (pg)    MCHC 32.3  30.0 - 36.0 (g/dL)    RDW 81.1 (*) 91.4 - 15.5 (%)    Platelets 328  150 - 400 (K/uL)   BASIC METABOLIC PANEL     Status: Abnormal   Collection Time   05/10/11  5:40 AM      Component Value Range Comment   Sodium 140  135 - 145 (mEq/L)    Potassium 3.1 (*) 3.5 - 5.1 (mEq/L)    Chloride 104  96 - 112 (mEq/L)    CO2 25  19 - 32 (mEq/L)    Glucose, Bld 95  70 - 99 (mg/dL)    BUN 9  6 - 23 (mg/dL)    Creatinine, Ser 7.82  0.50 - 1.35 (mg/dL)    Calcium 8.3 (*) 8.4 - 10.5 (mg/dL)    GFR calc non Af Amer >90  >90 (mL/min)    GFR calc Af Amer >90  >90 (mL/min)     IMAGING: No results found.  IMPRESSION: Principal Problem:  *Right leg numbness, Rt iliopsois hematoma secondary to high INR on adm Active Problems:  Supratherapeutic INR, INR 10 on admission  HTN (hypertension)  Melena  Anemia, Hgb 7.1 on admission  PAF, was to have  DCCV 4/12 but was in NSR, now in and out of flutter  Chronic anticoagulation, PE DVT PAF  Dyslipidemia  Hypokalemia  History of DVT (deep vein thrombosis)  Pulmonary embolism, "Massive" in 2008  Obesity  Psoriasis  RECOMMENDATION: Add beta blocker, MD to see.  Time Spent Directly with Patient:  KILROY,LUKE K 05/10/2011, 12:43 PM   ATTENDING ATTESTATION:  I have seen and examined the patient along with Corine Shelter, PA.  I have reviewed the chart, notes and new data.  I agree with LUKE's note.  61 y/o man with paroxysmal Atrial Flutter, morbid obesity, history of massive PE in 2008, history of infected left groin hematoma status post operative repair, bilateral iliac artery aneurysms -- who presented initially with right leg pain that is thought to be due to inflammation swelling in the right groin thought to be due to a spontaneous right iliopsoas hematoma related to  severely supratherapeutic INR on admission. Interestingly he states he has had a check several days apart before presentation.  We have been counseled as he is reverted back to atrial flutter with a relatively controlled rate. He is on minimal dose of digoxin as well as diltiazem as tolerated spend most in the low 100s during my evaluation.  He does not immediately symptomatically with the atrial flutter denying any dyspnea or chest discomfort is occasionally noticing some flutters. He is currently anticoagulated but with concern for GI bleeding is probably to have his warfarin held, therefore will would not want to pursue cardioversion at this juncture as his regulation likely discontinued for short-term.  PLAN:  For now I would recommend adding a beta blocker to the calcium blocker digoxin. Also increase digoxin dose to 250 mcg.  Department for increased abuse intravenous diltiazem bolus; however the current rate can easily be explained by his ongoing anemia and ongoing inflammation.  We'll continue to follow  along.  At this point rate control is the optimal option, however in the future if anticoagulation can be reinitiated and flutter becomes an issue again, would consider recommending atrial flutter ablation.  Marykay Lex, M.D., M.S. [facility] [address] [place], [place]  [address]  [phone]  05/10/2011 1:56 PM

## 2011-05-10 NOTE — Progress Notes (Addendum)
PATIENT DETAILS Name: ARYN KOPS Age: 61 y.o. Sex: male Date of Birth: 15-Aug-1950 Admit Date: 05/07/2011 PCP:No primary provider on file.  Subjective: Right hip/thigh pain still there-but improving, patient claims that he is now able to move his lower extremity much better  Objective: Vital signs in last 24 hours: Filed Vitals:   05/09/11 1410 05/09/11 1425 05/09/11 2200 05/10/11 0541  BP:   152/79 129/76  Pulse: 150 95 112 78  Temp:   98.4 F (36.9 C) 98.1 F (36.7 C)  TempSrc:   Oral Oral  Resp:   18 18  Height:      Weight:      SpO2:   98% 96%    Weight change:   Body mass index is 44.04 kg/(m^2).  Intake/Output from previous day:  Intake/Output Summary (Last 24 hours) at 05/10/11 1044 Last data filed at 05/10/11 0500  Gross per 24 hour  Intake 4956.33 ml  Output   1052 ml  Net 3904.33 ml    PHYSICAL EXAM: Gen Exam: Awake and alert with clear speech.   Neck: Supple, No JVD.   Chest: B/L Clear.  CVS: S1 S2 Regular, no murmurs. Abdomen: soft, BS +, non tender, non distended.  Extremities: no edema, lower extremities warm to touch. Neurologic: Non Focal.   Skin: No Rash.   Wounds: N/A.    CONSULTS:  GI and vascular surgery  LAB RESULTS: CBC  Lab 05/10/11 0540 05/09/11 0703 05/08/11 2328 05/08/11 0929 05/08/11 0308 05/07/11 0522  WBC 9.0 9.6 11.4* -- 10.3 9.6  HGB 8.5* 8.4* 8.2* 7.7* 7.3* --  HCT 26.3* 25.6* 24.9* 23.1* 21.9* --  PLT 328 310 294 -- 275 336  MCV 86.5 87.7 86.5 -- 86.9 90.7  MCH 28.0 28.8 28.5 -- 29.0 29.5  MCHC 32.3 32.8 32.9 -- 33.3 32.6  RDW 19.6* 20.5* 20.3* -- 21.4* 20.4*  LYMPHSABS -- -- -- -- -- --  MONOABS -- -- -- -- -- --  EOSABS -- -- -- -- -- --  BASOSABS -- -- -- -- -- --  BANDABS -- -- -- -- -- --    Chemistries   Lab 05/10/11 0540 05/09/11 0703 05/07/11 0823 05/07/11 0534  NA 140 139 -- 140  K 3.1* 3.7 -- 3.4*  CL 104 102 -- 106  CO2 25 25 -- --  GLUCOSE 95 91 -- 119*  BUN 9 10 -- 20  CREATININE 0.76  0.86 -- 1.10  CALCIUM 8.3* 8.4 -- --  MG -- -- 2.0 --    GFR Estimated Creatinine Clearance: 153.1 ml/min (by C-G formula based on Cr of 0.76).  Coagulation profile  Lab 05/10/11 0540 05/09/11 0703 05/08/11 0929 05/08/11 0307 05/07/11 0643  INR 3.02* 2.51* 2.15* 1.77* >10.00*  PROTIME -- -- -- -- --    Cardiac Enzymes No results found for this basename: CK:3,CKMB:3,TROPONINI:3,MYOGLOBIN:3 in the last 168 hours  No components found with this basename: POCBNP:3 No results found for this basename: DDIMER:2 in the last 72 hours No results found for this basename: HGBA1C:2 in the last 72 hours No results found for this basename: CHOL:2,HDL:2,LDLCALC:2,TRIG:2,CHOLHDL:2,LDLDIRECT:2 in the last 72 hours No results found for this basename: TSH,T4TOTAL,FREET3,T3FREE,THYROIDAB in the last 72 hours No results found for this basename: VITAMINB12:2,FOLATE:2,FERRITIN:2,TIBC:2,IRON:2,RETICCTPCT:2 in the last 72 hours No results found for this basename: LIPASE:2,AMYLASE:2 in the last 72 hours  Urine Studies No results found for this basename: UACOL:2,UAPR:2,USPG:2,UPH:2,UTP:2,UGL:2,UKET:2,UBIL:2,UHGB:2,UNIT:2,UROB:2,ULEU:2,UEPI:2,UWBC:2,URBC:2,UBAC:2,CAST:2,CRYS:2,UCOM:2,BILUA:2 in the last 72 hours  MICROBIOLOGY: No results found for this or any previous  visit (from the past 240 hour(s)).  RADIOLOGY STUDIES/RESULTS: Ct Abdomen Pelvis Wo Contrast  05/08/2011  *RADIOLOGY REPORT*  Clinical Data: Leg numbness.  Anemia.  Rule out retroperitoneal bleed.  CT ABDOMEN AND PELVIS WITHOUT CONTRAST  Technique:  Multidetector CT imaging of the abdomen and pelvis was performed following the standard protocol without intravenous contrast.  Comparison: CT pelvis 05/07/2011.  CT abdomen and pelvis 08/20/2006  Findings: Slight fibrosis in the lung bases.  Small esophageal hiatal hernia.  Circumscribed cyst in the anterior right lobe of the liver is stable since the prior study.  Unenhanced appearance of the liver,  spleen, gallbladder, pancreas, and adrenal glands is otherwise unremarkable.  There is increased density in the renal collecting systems, ureters, and bladder bilaterally suggesting previous contrast administration.  Mild calcification of the abdominal aorta.  Bilateral iliac artery aneurysms, measuring 3.2 cm on the right and 3.6 cm on the left.  There is infiltration around the right iliac artery aneurysm and along the iliopsoas muscle suggesting edema, inflammation, or hematoma, possibly due to rupture.  Asymmetric enlargement of the right piriformis muscle with a vague low attenuation change.  This might represent intramuscular fluid collection or hematoma. Moderate sized umbilical hernia containing fat.  No free air or free fluid in the abdomen.  Stomach, small bowel, and colon are not distended.  Pelvis:  Prostate gland is not enlarged.  Bladder wall is not thickened.  No significant pelvic lymphadenopathy.  Degenerative changes in the lumbar spine with normal alignment.  IMPRESSION: Infiltration around the right iliopsoas muscle with asymmetric enlargement of the iliopsoas and piriformis muscles.  Suggestion of low attenuation change in the right piriformis muscle.  Changes may represent inflammatory edema or hematoma.  There is a right iliac artery aneurysm and rupture is not excluded.  There is also a left iliac artery aneurysm.  Results discussed by telephone at the time of dictation, 0209 hours on 05/08/2011 with  the patient's nurse, Lamar Laundry,  who aknowledged these results and will contact the referring physician.  Original Report Authenticated By: Marlon Pel, M.D.   Dg Chest 1 View  04/16/2011  *RADIOLOGY REPORT*  Clinical Data: Pharm[employer] study.  CHEST - 1 VIEW  Comparison: 05/01/2010  Findings: Heart and mediastinal contours are within normal limits. No focal opacities or effusions.  No acute bony abnormality.  IMPRESSION: No active cardiopulmonary disease.  Original Report Authenticated By:  Cyndie Chime, M.D.   Dg Chest 2 View  05/07/2011  *RADIOLOGY REPORT*  Clinical Data: Right thigh pain since 10:00 a.m.  No known injury. Shortness of breath.  Numbness.  CHEST - 2 VIEW  Comparison: 04/16/2011  Findings: Shallow inspiration.  Mild cardiac enlargement.  Normal pulmonary vascularity.  No focal airspace consolidation or edema. No blunting of the costophrenic angles.  No pneumothorax. Degenerative changes in the thoracic spine.  No significant change since previous study.  IMPRESSION: No evidence of active pulmonary disease.  Original Report Authenticated By: Marlon Pel, M.D.   Ct Head Wo Contrast  05/07/2011  *RADIOLOGY REPORT*  Clinical Data: Right leg numbness. Light bothers eyes.  CT HEAD WITHOUT CONTRAST  Technique:  Contiguous axial images were obtained from the base of the skull through the vertex without contrast.  Comparison: None.  Findings: The ventricles and sulci appear symmetrical.  No mass effect or midline shift.  No abnormal extra-axial fluid collections.  Vague focal area of low attenuation in the left parietal white matter posterior to the sylvian fissure.  This may  represent partial voluming with the posterior horn of the left lateral ventricle but a focal area of early ischemia can also have this appearance.  No evidence of acute intracranial hemorrhage. Gray-white matter junctions are distinct.  Basal cisterns are not effaced.  No depressed skull fractures.  Vascular calcifications. Visualized paranasal sinuses and mastoid air cells are not opacified.  Old nasal bone fracture.  IMPRESSION: Nonspecific low attenuation area in the left posterior parietal white matter which could represent early ischemic focus versus partial voluming.  No significant mass effect.  No acute intracranial hemorrhage.  Original Report Authenticated By: Marlon Pel, M.D.   Ct Femur Right W Contrast  05/07/2011  *RADIOLOGY REPORT*  Clinical Data: Right thigh pain.  History of  abscess.  CT OF THE RIGHT LOWER EXTREMITY WITH CONTRAST  Contrast: OMNIPAQUE IOHEXOL 300 MG/ML  SOLN  Comparison: CT abdomen and pelvis 08/20/2006.  Findings: No abscess is identified.  There is infiltration of fat about the iliopsoas muscle belly and the muscle appears mildly edematous.  Ossification of the iliopsoas tendon at the lesser trochanter is compatible with old injury/enthesopathic change. Visualized muscles and tendons otherwise are normal in appearance. Subcutaneous fatty tissues are normal in appearance.  No mass is identified.  No bony destructive change or fracture is seen.  Marked appearing degenerative disease about the right knee is noted with a small joint effusion is seen. There is a loose body in the posterior aspect of the joint measuring 1.5 cm in diameter.  Imaged intrapelvic contents demonstrate a right external iliac node measuring 1.5 cm short axis dimension on image 8.  IMPRESSION:  1.  Negative for abscess. 2.  Infiltration of fat about the iliopsoas muscle in the proximal thigh with some edematous change within the muscle belly could be due to infectious or inflammatory change.  Muscle strain could create a similar appearance.  Enthesopathic change of the iliopsoas tendon is again noted. 3.  Small right external iliac lymph node is likely reactive. 4.  Advanced degenerative disease about the right knee.  Original Report Authenticated By: Bernadene Bell. D'ALESSIO, M.D.   Dg Shoulder Left  05/01/2011  *RADIOLOGY REPORT*  Clinical Data: Pain post crushing injury  LEFT SHOULDER - 2+ VIEW  Comparison: None.  Findings: Three views of the left shoulder submitted.  No acute fracture or subluxation.  No radiopaque foreign body.  IMPRESSION: No acute fracture or subluxation.  Original Report Authenticated By: Natasha Mead, M.D.   Dg Humerus Left  05/01/2011  *RADIOLOGY REPORT*  Clinical Data: Injured left arm and shoulder approximately 1 week ago while pulling in self out of a car.  Patient  unable to adduct the left arm above chest level.  LEFT HUMERUS - 2+ VIEW 05/01/2011:  Comparison: Left shoulder x-rays obtained concurrently.  Findings: No evidence of acute, subacute, or healed fractures.  No intrinsic osseous abnormalities involving the humerus.  Well- preserved bone mineral density.  Visualized elbow joint intact.  IMPRESSION: Normal examination.  Original Report Authenticated By: Arnell Sieving, M.D.   Ct Angio Abd/pel W/ And/or W/o  05/08/2011  *RADIOLOGY REPORT*  Clinical Data: Left leg weakness and pain.  Known iliac aneurysms with infiltration or hematoma in the right iliopsoas region on prior unenhanced CT.  Decreasing hemoglobin with multiple transfusions.  CT ANGIOGRAPHY ABDOMEN AND PELVIS WITH CONTRAST AND WITHOUT CONTRAST  Comparison: Unenhanced CT 05/08/2011  Findings: The study is technically limited due to the patient's body habitus.  There is overlying artifact centered at  the area of interest.  This limits visualization of this area.  However, there appears to be flow throughout the right and left iliac artery aneurysms without evidence of thrombosis or occlusion.  There is no obvious contrast extravasation although a small focus of active hemorrhage could be obscured.  Again demonstrated is a infiltration or hematoma around the right iliac artery aneurysm and in the iliopsoas recent extending down to the growing and in the piriformis muscles as before.  No significant increase.  The lung bases are clear.  Cyst again demonstrated in the liver. Cyst in the right kidney.  No hydronephrosis.  Normal homogeneous parenchymal nephrograms.  The gallbladder, pancreas, spleen, adrenal glands, the stomach, small bowel, colon, and retroperitoneal lymph nodes are unremarkable.  Small esophageal hiatal hernia.  Calcification of the aorta.  Broad-based umbilical hernia containing fat.  Pelvis:  Bladder wall is not thickened.  No significant pelvic lymphadenopathy.  No evidence of  loculated fluid collection.  The appendix is normal.  IMPRESSION: Technically limited study due to the patient's body habitus. Bilateral iliac artery aneurysms are again demonstrated with infiltration or hematoma in the fat around the right iliac aneurysm and in the iliopsoas and piriformis muscles.  No definite evidence of active contrast extravasation.  Intermittent or low grade leakage is not excluded.  Original Report Authenticated By: Marlon Pel, M.D.    MEDICATIONS: Scheduled Meds:    . antiseptic oral rinse  15 mL Mouth Rinse q12n4p  . atorvastatin  10 mg Oral q1800  . chlorhexidine  15 mL Mouth Rinse BID  . digoxin  125 mcg Oral Daily  . diltiazem  360 mg Oral Daily  . fluocinonide ointment  1 application Topical BID  . hydrocortisone cream   Topical BID  . magnesium citrate  1 Bottle Oral Once  . metoprolol  5 mg Intravenous Once  . phytonadione  5 mg Oral Once  . phytonadione  5 mg Oral Once  . potassium chloride  40 mEq Oral Once  . sodium chloride  3 mL Intravenous Q12H  . triamcinolone ointment  1 application Topical Daily  . DISCONTD: diltiazem  360 mg Oral Daily   Continuous Infusions:    . sodium chloride 1,000 mL (05/10/11 0711)   PRN Meds:.acetaminophen, acetaminophen, morphine, ondansetron (ZOFRAN) IV, ondansetron, oxyCODONE, oxyCODONE-acetaminophen, sodium chloride  Antibiotics: Anti-infectives    None      Assessment/Plan: Patient Active Hospital Problem List: Right leg numbness /pain -is likely secondary to the infiltration/hematoma seen around the right iliac artery aneurysm -has no overt weakness, cant move right leg-but this is due to pain!-he claims to me that the pain is better today and he is able to move his leg much more today -left lower ext is normal and pain free   Supratherapeutic INR  -patient claims no recent dose changes, denies any antibiotic use as well -INR still 3.02 after 3 units of FFP and Vit K, will give one dose of Vit  K today  Anemia -from acute blood loss -this is likely GI bleed-from supra-therapeutic INR -will need GI work up prior to recommencing anticoagulation - This is his second episode of bleeding, had a significant hematoma in left lower ext 2008 -Hb now stable at 8.5 after 3 units of PRBC transfusion  GI Bleed -Likely upper given h/o melena -EGD 4/26-results noted, Colonoscopy now re-scheduled for tomorrow given high INR  PAF -in and out of Aflutter since last evening -resume cardizem -continue with Digoxin -coumadin on hold-may need  to think about transitioning to perhaps Xarelto, given INR more than 10 on admit  -primary cardiologist-[facility] informed  Bilateral Iliac Artery Aneurysms -per [facility]  HTN (hypertension)  -currently stable off meds  Hypokalemia -will replete  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 05/10/2011, 10:44 AM

## 2011-05-10 NOTE — Progress Notes (Signed)
Patient ID: [name], male   DOB: Apr 17, 1950, 61 y.o.   MRN: [id number] Vascular Surgery Progress Note  Subjective: Patient to have colonoscopy today No evidence  of bleeding from iliac aneurysms  Objective:  Filed Vitals:   05/10/11 0541  BP: 129/76  Pulse: 78  Temp: 98.1 F (36.7 C)  Resp: 18    And soft nontender with 3+ femoral pulses palpable   Labs:  Lab 05/10/11 0540 05/09/11 0703 05/07/11 0534  CREATININE 0.76 0.86 1.10    Lab 05/10/11 0540 05/09/11 0703 05/07/11 0534  NA 140 139 140  K 3.1* 3.7 3.4*  CL 104 102 106  CO2 25 25 --  BUN 9 10 20   CREATININE 0.76 0.86 1.10  LABGLOM -- -- --  GLUCOSE 95 -- --  CALCIUM 8.3* 8.4 --    Lab 05/10/11 0540 05/09/11 0703 05/08/11 2328  WBC 9.0 9.6 11.4*  HGB 8.5* 8.4* 8.2*  HCT 26.3* 25.6* 24.9*  PLT 328 310 294    Lab 05/10/11 0540 05/09/11 0703 05/08/11 0929  INR 3.02* 2.51* 2.15*    I/O last 3 completed shifts: In: 4956.3 [P.O.:1080; I.V.:3876.3] Out: 1652 [Urine:1650; Stool:2]  Imaging: No results found.  Assessment/Plan:  POD #  LOS: 3 days  s/p Procedure(s): ESOPHAGOGASTRODUODENOSCOPY (EGD)  Dr Imogene Burn  to followup in a.m.   Josephina Gip, MD 05/10/2011 9:37 AM

## 2011-05-10 NOTE — Progress Notes (Signed)
[facility] Progress Note  Subjective: *No new complaints.  **  Objective:  Vital signs in last 24 hours: Temp:  [98.1 F (36.7 C)-98.4 F (36.9 C)] 98.1 F (36.7 C) (04/28 0541) Pulse Rate:  [78-150] 78  (04/28 0541) Resp:  [18-20] 18  (04/28 0541) BP: (128-152)/(74-79) 129/76 mmHg (04/28 0541) SpO2:  [96 %-98 %] 96 % (04/28 0541) Last BM Date: 05/09/11 General:   Alert,  Well-developed,  white male in NAD Heart:  Regular rate and rhythm; no murmurs Abdomen:  Soft, nontender and nondistended. Normal bowel sounds, without guarding, and without rebound.   Extremities:  Without edema. Neurologic:  Alert and  oriented x4;  grossly normal neurologically. Psych:  Alert and cooperative. Normal mood and affect.  Intake/Output from previous day: 04/27 0701 - 04/28 0700 In: 4956.3 [P.O.:1080; I.V.:3876.3] Out: 1052 [Urine:1050; Stool:2] Intake/Output this shift:    Lab Results:  Basename 05/10/11 0540 05/09/11 0703 05/08/11 2328  WBC 9.0 9.6 11.4*  HGB 8.5* 8.4* 8.2*  HCT 26.3* 25.6* 24.9*  PLT 328 310 294   BMET  Basename 05/10/11 0540 05/09/11 0703  NA 140 139  K 3.1* 3.7  CL 104 102  CO2 25 25  GLUCOSE 95 91  BUN 9 10  CREATININE 0.76 0.86  CALCIUM 8.3* 8.4   LFT  Basename 05/09/11 0703  PROT 6.8  ALBUMIN 2.8*  AST 26  ALT 13  ALKPHOS 88  BILITOT 0.7  BILIDIR --  IBILI --   PT/INR  Basename 05/10/11 0540 05/09/11 0703  LABPROT 31.8* 27.5*  INR 3.02* 2.51*   Hepatitis Panel No results found for this basename: HEPBSAG,HCVAB,HEPAIGM,HEPBIGM in the last 72 hours  Studies/Results: No results found.INR 3   Assessment / Plan: *Melena.  EGD negative.  INR still elevated (3.0)  Will postpone colonoscopy until 4/29. ** Principal Problem:  *Right leg numbness Active Problems:  Supratherapeutic INR  HTN (hypertension)  Melena  Anemia  Hypokalemia  History of DVT (deep vein thrombosis)     LOS: 3 days   [name]  05/10/2011,  10:19 AM

## 2011-05-11 ENCOUNTER — Encounter (HOSPITAL_COMMUNITY): Payer: Self-pay | Admitting: Gastroenterology

## 2011-05-11 ENCOUNTER — Encounter (HOSPITAL_COMMUNITY)
Admission: EM | Disposition: A | Discharge status: Skilled Nursing Facility | Payer: Self-pay | Source: Home / Self Care | Attending: Internal Medicine

## 2011-05-11 ENCOUNTER — Inpatient Hospital Stay (HOSPITAL_COMMUNITY): Payer: Medicare Other

## 2011-05-11 DIAGNOSIS — G4733 Obstructive sleep apnea (adult) (pediatric): Secondary | ICD-10-CM | POA: Diagnosis present

## 2011-05-11 DIAGNOSIS — I739 Peripheral vascular disease, unspecified: Secondary | ICD-10-CM | POA: Diagnosis present

## 2011-05-11 DIAGNOSIS — Z9989 Dependence on other enabling machines and devices: Secondary | ICD-10-CM | POA: Diagnosis present

## 2011-05-11 HISTORY — PX: COLONOSCOPY: SHX5424

## 2011-05-11 LAB — CBC
Hemoglobin: 8.4 g/dL — ABNORMAL LOW (ref 13.0–17.0)
MCH: 28 pg (ref 26.0–34.0)
MCHC: 31.9 g/dL (ref 30.0–36.0)
Platelets: 325 10*3/uL (ref 150–400)
RDW: 19.2 % — ABNORMAL HIGH (ref 11.5–15.5)

## 2011-05-11 LAB — BASIC METABOLIC PANEL
Calcium: 8.3 mg/dL — ABNORMAL LOW (ref 8.4–10.5)
GFR calc Af Amer: >90 mL/min (ref >90)
GFR calc non Af Amer: 90 mL/min — ABNORMAL LOW (ref >90)
Potassium: 3.6 mEq/L (ref 3.5–5.1)
Sodium: 140 mEq/L (ref 135–145)

## 2011-05-11 LAB — PROTIME-INR
INR: 2.19 — ABNORMAL HIGH (ref 0.00–1.49)
Prothrombin Time: 24.7 seconds — ABNORMAL HIGH (ref 11.6–15.2)

## 2011-05-11 SURGERY — COLONOSCOPY
Anesthesia: Moderate Sedation

## 2011-05-11 MED ORDER — PHYTONADIONE 5 MG PO TABS
5.0000 mg | ORAL_TABLET | Freq: Once | ORAL | Status: AC
  Administered 2011-05-11: 5 mg via ORAL
  Filled 2011-05-11: qty 1

## 2011-05-11 MED ORDER — MIDAZOLAM HCL 10 MG/2ML IJ SOLN
INTRAMUSCULAR | Status: AC
  Filled 2011-05-11: qty 2

## 2011-05-11 MED ORDER — PEG 3350-KCL-NA BICARB-NACL 420 G PO SOLR
4000.0000 mL | Freq: Once | ORAL | Status: AC
  Administered 2011-05-11: 4000 mL via ORAL
  Filled 2011-05-11: qty 4000

## 2011-05-11 MED ORDER — FENTANYL CITRATE 0.05 MG/ML IJ SOLN
INTRAMUSCULAR | Status: AC
  Filled 2011-05-11: qty 2

## 2011-05-11 NOTE — Progress Notes (Signed)
05/11/11 0530 Pt fell asleep and pulled out his IV.  Moderate amount of blood on patient's clothing and bedding.  IV fluids moved to right arm IV site.  Dahlia Byes Boschen

## 2011-05-11 NOTE — Progress Notes (Signed)
Subjective:  Rt leg numbness improving.  Objective:  Vital Signs in the last 24 hours: Temp:  [98.2 F (36.8 C)-98.3 F (36.8 C)] 98.3 F (36.8 C) (04/29 0547) Pulse Rate:  [75-92] 75  (04/29 0547) Resp:  [18] 18  (04/29 0547) BP: (124-132)/(73-86) 131/82 mmHg (04/29 0547) SpO2:  [95 %-96 %] 95 % (04/29 0547)  Intake/Output from previous day:  Intake/Output Summary (Last 24 hours) at 05/11/11 0900 Last data filed at 05/11/11 0836  Gross per 24 hour  Intake 3761.67 ml  Output    902 ml  Net 2859.67 ml    Physical Exam: General appearance: alert, cooperative, no distress and morbidly obese Lungs: decreased breath sounds Heart: regular rate and rhythm   Rate: 76  Rhythm: normal sinus rhythm  Lab Results:  Basename 05/11/11 0535 05/10/11 0540  WBC 8.6 9.0  HGB 8.4* 8.5*  PLT 325 328    Basename 05/11/11 0535 05/10/11 0540  NA 140 140  K 3.6 3.1*  CL 105 104  CO2 26 25  GLUCOSE 94 95  BUN 10 9  CREATININE 0.91 0.76   No results found for this basename: TROPONINI:2,CK,MB:2 in the last 72 hours Hepatic Function Panel  Basename 05/09/11 0703  PROT 6.8  ALBUMIN 2.8*  AST 26  ALT 13  ALKPHOS 88  BILITOT 0.7  BILIDIR --  IBILI --   No results found for this basename: CHOL in the last 72 hours  Basename 05/11/11 0535  INR 2.19*    Imaging: Imaging results have been reviewed ? ileus  Cardiac Studies:  Assessment/Plan:   Principal Problem:  *Right leg numbness, Rt iliopsois hematoma secondary to high INR on adm Active Problems:  Supratherapeutic INR, INR 10 on admission  HTN (hypertension)  Melena  Anemia, Hgb 7.1 on admission  PAF, was to have DCCV 4/12 but was in NSR, now in and out of flutter  Chronic anticoagulation, PE DVT PAF  Dyslipidemia  PVD, bilat iliac aneurysms  Hypokalemia  History of DVT (deep vein thrombosis)  Pulmonary embolism, "Massive" in 2008  Morbid obesity  Psoriasis  Sleep apnea, suspected   Plan- GI w/u in  progress, for colonoscopy today, EGD negative. Will discuss plans for long term anticoagulation and question of IVC filter with MD. He needs a sleep study at some point, Rx of sleep apnea may help with PAF.    Corine Shelter PA-C 05/11/2011, 9:00 AM   I have seen and examined the patient along with Corine Shelter PA-C.  I have reviewed the chart, notes and new data.  I agree with PA 's note.  PLAN: Anticoagulation with warfarin has been a challenge with volatile PT levels and bleeding complications. Theoretically, anticoagulation with xarelto would be more stable, but this will come at the price of using an agent that, while shorter acting, does not have a specific "antidote". Experience with Xarelto in a morbidly obese patient is more limited than with warfarin (we can monitor warfarin effectiveness with warfarin, but no "test' for Xarelto), and there may be a theoretical concern re; efficacy in such a large patient. However, since he will have an IVC filter, the fear of recurrent VTE/PE is lower.  With all these considerations, I think the added consistency of anticoagulation w Xarelto may still make this a worthwhile change.  Suggest starting Xarelto after colonoscopy and IVC filter implantation.  Thurmon Fair, MD, [facility] [phone] 05/11/2011, 1:05 PM

## 2011-05-11 NOTE — Progress Notes (Signed)
Utilization review completed.  

## 2011-05-11 NOTE — Progress Notes (Signed)
Subjective: Patient has been trying to prep for the procedure. He has brown stools today with an inr OF 2.19 today. No active GI problems at this time.  Objective: Vital signs in last 24 hours: Temp:  [98.2 F (36.8 C)-100 F (37.8 C)] 100 F (37.8 C) 06/08/2022 1423) Pulse Rate:  [75-85] 80  06-08-22 1042) Resp:  [18-38] 19  06-08-2022 1540) BP: (124-132)/(68-82) 131/76 mmHg Jun 08, 2022 1540) SpO2:  [95 %-99 %] 97 % 06/08/22 1540) Last BM Date: 06/08/2011 Intake/Output from previous day: 04/28 0701 - Jun 08, 2022 0700 In: 3761.7 [P.O.:360; I.V.:3201.7; IV Piggyback:200] Out: 902 [Urine:900; Stool:2] Intake/Output this shift:  General appearance: morbidly obese, alert, cooperative, appears older than stated age, no distress and morbidly obese Resp: clear to auscultation bilaterally Cardio: regular rate and rhythm, S1, S2 normal, no murmur, click, rub or gallop GI: soft, non-tender; morbidly obese with decreased bowel sounds; no masses,  no organomegaly Lab Results: Basename 06-08-11 0535 05/10/11 0540 05/09/11 0703  WBC 8.6 9.0 9.6  HGB 8.4* 8.5* 8.4*  HCT 26.3* 26.3* 25.6*  PLT 325 328 310  BMET Basename 06-08-11 0535 05/10/11 0540 05/09/11 0703  NA 140 140 139  K 3.6 3.1* 3.7  CL 105 104 102  CO2 26 25 25   GLUCOSE 94 95 91  BUN 10 9 10   CREATININE 0.91 0.76 0.86  CALCIUM 8.3* 8.3* 8.4  LFT Basename 05/09/11 0703  PROT 6.8  ALBUMIN 2.8*  AST 26  ALT 13  ALKPHOS 88  BILITOT 0.7  BILIDIR --  IBILI --  PT/INR Basename 06-08-2011 0535 05/10/11 0540  LABPROT 24.7* 31.8*  INR 2.19* 3.02*   Studies/Results: Dg Abd 2 Views  06-08-11  *RADIOLOGY REPORT*  Clinical Data: Abdominal pain, distension  ABDOMEN - 2 VIEW  Comparison: CT abdomen pelvis dated 05/08/2011  Findings: Colonic distension, progressed from recent CT, suggesting adynamic colonic ileus.  No evidence of small bowel obstruction.  No evidence of free air on the lateral decubitus view.  Degenerative changes of the visualized  thoracolumbar spine.  IMPRESSION: Colonic distension, increased, suggesting adynamic colonic ileus.  No evidence of small bowel obstruction or free air.  Original Report Authenticated By: Charline Bills, M.D.  Medications: I have reviewed the patient's current medications.  Assessment/Plan: 1) Anemia/Melena: Will give 2 more doses of Vitamin K tonight. Wil reprep tonight for a colonoscopy tomorrow.  2) ?Adynamic ileus: I am not sure whether this is truly an issue. Will monitor closely tonight.   LOS: 4 days   Jeriah Corkum Jun 08, 2011, 4:11 PM

## 2011-05-11 NOTE — Progress Notes (Signed)
PATIENT DETAILS Name: [name] Age: 61 y.o. Sex: male Date of Birth: 11/01/1950 Admit Date: 05/07/2011 PCP:No primary provider on file.  Subjective: Right hip/thigh pain continues to improve, patient able to move right leg more today   Objective: Vital signs in last 24 hours: Filed Vitals:   05/10/11 1354 05/10/11 2155 05/11/11 0547 05/11/11 1042  BP: 127/86 124/76 131/82 130/68  Pulse: 92 85 75 80  Temp: 98.2 F (36.8 C) 98.2 F (36.8 C) 98.3 F (36.8 C)   TempSrc: Oral Oral Oral   Resp: 18 18 18    Height:      Weight:      SpO2: 95% 96% 95%     Weight change:   Body mass index is 44.04 kg/(m^2).  Intake/Output from previous day:  Intake/Output Summary (Last 24 hours) at 05/11/11 1301 Last data filed at 05/11/11 0836  Gross per 24 hour  Intake 3661.67 ml  Output    202 ml  Net 3459.67 ml    PHYSICAL EXAM: Gen Exam: Awake and alert with clear speech.   Neck: Supple, No JVD.   Chest: B/L Clear. No rhonci or rales heared CVS: S1 S2 Regular, no murmurs. Abdomen: soft, BS +, non tender, non distended.  Extremities: no edema, lower extremities warm to touch.Right thigh-not distended/swollen Neurologic: Non Focal.   Skin: No Rash.   Wounds: N/A.    CONSULTS:  GI and vascular surgery  LAB RESULTS: CBC  Lab 05/11/11 0535 05/10/11 0540 05/09/11 0703 05/08/11 2328 05/08/11 0929 05/08/11 0308  WBC 8.6 9.0 9.6 11.4* -- 10.3  HGB 8.4* 8.5* 8.4* 8.2* 7.7* --  HCT 26.3* 26.3* 25.6* 24.9* 23.1* --  PLT 325 328 310 294 -- 275  MCV 87.7 86.5 87.7 86.5 -- 86.9  MCH 28.0 28.0 28.8 28.5 -- 29.0  MCHC 31.9 32.3 32.8 32.9 -- 33.3  RDW 19.2* 19.6* 20.5* 20.3* -- 21.4*  LYMPHSABS -- -- -- -- -- --  MONOABS -- -- -- -- -- --  EOSABS -- -- -- -- -- --  BASOSABS -- -- -- -- -- --  BANDABS -- -- -- -- -- --    Chemistries   Lab 05/11/11 0535 05/10/11 0540 05/09/11 0703 05/07/11 0823 05/07/11 0534  NA 140 140 139 -- 140  K 3.6 3.1* 3.7 -- 3.4*  CL 105 104 102 --  106  CO2 26 25 25  -- --  GLUCOSE 94 95 91 -- 119*  BUN 10 9 10  -- 20  CREATININE 0.91 0.76 0.86 -- 1.10  CALCIUM 8.3* 8.3* 8.4 -- --  MG -- -- -- 2.0 --    GFR Estimated Creatinine Clearance: 134.6 ml/min (by C-G formula based on Cr of 0.91).  Coagulation profile  Lab 05/11/11 0535 05/10/11 0540 05/09/11 0703 05/08/11 0929 05/08/11 0307  INR 2.19* 3.02* 2.51* 2.15* 1.77*  PROTIME -- -- -- -- --    Cardiac Enzymes No results found for this basename: CK:3,CKMB:3,TROPONINI:3,MYOGLOBIN:3 in the last 168 hours  No components found with this basename: POCBNP:3 No results found for this basename: DDIMER:2 in the last 72 hours No results found for this basename: HGBA1C:2 in the last 72 hours No results found for this basename: CHOL:2,HDL:2,LDLCALC:2,TRIG:2,CHOLHDL:2,LDLDIRECT:2 in the last 72 hours No results found for this basename: TSH,T4TOTAL,FREET3,T3FREE,THYROIDAB in the last 72 hours No results found for this basename: VITAMINB12:2,FOLATE:2,FERRITIN:2,TIBC:2,IRON:2,RETICCTPCT:2 in the last 72 hours No results found for this basename: LIPASE:2,AMYLASE:2 in the last 72 hours  Urine Studies No results found for this basename:  UACOL:2,UAPR:2,USPG:2,UPH:2,UTP:2,UGL:2,UKET:2,UBIL:2,UHGB:2,UNIT:2,UROB:2,ULEU:2,UEPI:2,UWBC:2,URBC:2,UBAC:2,CAST:2,CRYS:2,UCOM:2,BILUA:2 in the last 72 hours  MICROBIOLOGY: No results found for this or any previous visit (from the past 240 hour(s)).  RADIOLOGY STUDIES/RESULTS: Ct Abdomen Pelvis Wo Contrast  05/08/2011  *RADIOLOGY REPORT*  Clinical Data: Leg numbness.  Anemia.  Rule out retroperitoneal bleed.  CT ABDOMEN AND PELVIS WITHOUT CONTRAST  Technique:  Multidetector CT imaging of the abdomen and pelvis was performed following the standard protocol without intravenous contrast.  Comparison: CT pelvis 05/07/2011.  CT abdomen and pelvis 08/20/2006  Findings: Slight fibrosis in the lung bases.  Small esophageal hiatal hernia.  Circumscribed cyst in the  anterior right lobe of the liver is stable since the prior study.  Unenhanced appearance of the liver, spleen, gallbladder, pancreas, and adrenal glands is otherwise unremarkable.  There is increased density in the renal collecting systems, ureters, and bladder bilaterally suggesting previous contrast administration.  Mild calcification of the abdominal aorta.  Bilateral iliac artery aneurysms, measuring 3.2 cm on the right and 3.6 cm on the left.  There is infiltration around the right iliac artery aneurysm and along the iliopsoas muscle suggesting edema, inflammation, or hematoma, possibly due to rupture.  Asymmetric enlargement of the right piriformis muscle with a vague low attenuation change.  This might represent intramuscular fluid collection or hematoma. Moderate sized umbilical hernia containing fat.  No free air or free fluid in the abdomen.  Stomach, small bowel, and colon are not distended.  Pelvis:  Prostate gland is not enlarged.  Bladder wall is not thickened.  No significant pelvic lymphadenopathy.  Degenerative changes in the lumbar spine with normal alignment.  IMPRESSION: Infiltration around the right iliopsoas muscle with asymmetric enlargement of the iliopsoas and piriformis muscles.  Suggestion of low attenuation change in the right piriformis muscle.  Changes may represent inflammatory edema or hematoma.  There is a right iliac artery aneurysm and rupture is not excluded.  There is also a left iliac artery aneurysm.  Results discussed by telephone at the time of dictation, 0209 hours on 05/08/2011 with  the patient's nurse, Lamar Laundry,  who aknowledged these results and will contact the referring physician.  Original Report Authenticated By: Marlon Pel, M.D.   Dg Chest 1 View  04/16/2011  *RADIOLOGY REPORT*  Clinical Data: Pharm[employer] study.  CHEST - 1 VIEW  Comparison: 05/01/2010  Findings: Heart and mediastinal contours are within normal limits. No focal opacities or effusions.  No acute  bony abnormality.  IMPRESSION: No active cardiopulmonary disease.  Original Report Authenticated By: Cyndie Chime, M.D.   Dg Chest 2 View  05/07/2011  *RADIOLOGY REPORT*  Clinical Data: Right thigh pain since 10:00 a.m.  No known injury. Shortness of breath.  Numbness.  CHEST - 2 VIEW  Comparison: 04/16/2011  Findings: Shallow inspiration.  Mild cardiac enlargement.  Normal pulmonary vascularity.  No focal airspace consolidation or edema. No blunting of the costophrenic angles.  No pneumothorax. Degenerative changes in the thoracic spine.  No significant change since previous study.  IMPRESSION: No evidence of active pulmonary disease.  Original Report Authenticated By: Marlon Pel, M.D.   Ct Head Wo Contrast  05/07/2011  *RADIOLOGY REPORT*  Clinical Data: Right leg numbness. Light bothers eyes.  CT HEAD WITHOUT CONTRAST  Technique:  Contiguous axial images were obtained from the base of the skull through the vertex without contrast.  Comparison: None.  Findings: The ventricles and sulci appear symmetrical.  No mass effect or midline shift.  No abnormal extra-axial fluid collections.  Vague focal area of  low attenuation in the left parietal white matter posterior to the sylvian fissure.  This may represent partial voluming with the posterior horn of the left lateral ventricle but a focal area of early ischemia can also have this appearance.  No evidence of acute intracranial hemorrhage. Gray-white matter junctions are distinct.  Basal cisterns are not effaced.  No depressed skull fractures.  Vascular calcifications. Visualized paranasal sinuses and mastoid air cells are not opacified.  Old nasal bone fracture.  IMPRESSION: Nonspecific low attenuation area in the left posterior parietal white matter which could represent early ischemic focus versus partial voluming.  No significant mass effect.  No acute intracranial hemorrhage.  Original Report Authenticated By: Marlon Pel, M.D.   Ct Femur  Right W Contrast  05/07/2011  *RADIOLOGY REPORT*  Clinical Data: Right thigh pain.  History of abscess.  CT OF THE RIGHT LOWER EXTREMITY WITH CONTRAST  Contrast: OMNIPAQUE IOHEXOL 300 MG/ML  SOLN  Comparison: CT abdomen and pelvis 08/20/2006.  Findings: No abscess is identified.  There is infiltration of fat about the iliopsoas muscle belly and the muscle appears mildly edematous.  Ossification of the iliopsoas tendon at the lesser trochanter is compatible with old injury/enthesopathic change. Visualized muscles and tendons otherwise are normal in appearance. Subcutaneous fatty tissues are normal in appearance.  No mass is identified.  No bony destructive change or fracture is seen.  Marked appearing degenerative disease about the right knee is noted with a small joint effusion is seen. There is a loose body in the posterior aspect of the joint measuring 1.5 cm in diameter.  Imaged intrapelvic contents demonstrate a right external iliac node measuring 1.5 cm short axis dimension on image 8.  IMPRESSION:  1.  Negative for abscess. 2.  Infiltration of fat about the iliopsoas muscle in the proximal thigh with some edematous change within the muscle belly could be due to infectious or inflammatory change.  Muscle strain could create a similar appearance.  Enthesopathic change of the iliopsoas tendon is again noted. 3.  Small right external iliac lymph node is likely reactive. 4.  Advanced degenerative disease about the right knee.  Original Report Authenticated By: Bernadene Bell. D'ALESSIO, M.D.   Dg Shoulder Left  05/01/2011  *RADIOLOGY REPORT*  Clinical Data: Pain post crushing injury  LEFT SHOULDER - 2+ VIEW  Comparison: None.  Findings: Three views of the left shoulder submitted.  No acute fracture or subluxation.  No radiopaque foreign body.  IMPRESSION: No acute fracture or subluxation.  Original Report Authenticated By: Natasha Mead, M.D.   Dg Humerus Left  05/01/2011  *RADIOLOGY REPORT*  Clinical Data:  Injured left arm and shoulder approximately 1 week ago while pulling in self out of a car.  Patient unable to adduct the left arm above chest level.  LEFT HUMERUS - 2+ VIEW 05/01/2011:  Comparison: Left shoulder x-rays obtained concurrently.  Findings: No evidence of acute, subacute, or healed fractures.  No intrinsic osseous abnormalities involving the humerus.  Well- preserved bone mineral density.  Visualized elbow joint intact.  IMPRESSION: Normal examination.  Original Report Authenticated By: Arnell Sieving, M.D.   Ct Angio Abd/pel W/ And/or W/o  05/08/2011  *RADIOLOGY REPORT*  Clinical Data: Left leg weakness and pain.  Known iliac aneurysms with infiltration or hematoma in the right iliopsoas region on prior unenhanced CT.  Decreasing hemoglobin with multiple transfusions.  CT ANGIOGRAPHY ABDOMEN AND PELVIS WITH CONTRAST AND WITHOUT CONTRAST  Comparison: Unenhanced CT 05/08/2011  Findings: The study  is technically limited due to the patient's body habitus.  There is overlying artifact centered at the area of interest.  This limits visualization of this area.  However, there appears to be flow throughout the right and left iliac artery aneurysms without evidence of thrombosis or occlusion.  There is no obvious contrast extravasation although a small focus of active hemorrhage could be obscured.  Again demonstrated is a infiltration or hematoma around the right iliac artery aneurysm and in the iliopsoas recent extending down to the growing and in the piriformis muscles as before.  No significant increase.  The lung bases are clear.  Cyst again demonstrated in the liver. Cyst in the right kidney.  No hydronephrosis.  Normal homogeneous parenchymal nephrograms.  The gallbladder, pancreas, spleen, adrenal glands, the stomach, small bowel, colon, and retroperitoneal lymph nodes are unremarkable.  Small esophageal hiatal hernia.  Calcification of the aorta.  Broad-based umbilical hernia containing fat.   Pelvis:  Bladder wall is not thickened.  No significant pelvic lymphadenopathy.  No evidence of loculated fluid collection.  The appendix is normal.  IMPRESSION: Technically limited study due to the patient's body habitus. Bilateral iliac artery aneurysms are again demonstrated with infiltration or hematoma in the fat around the right iliac aneurysm and in the iliopsoas and piriformis muscles.  No definite evidence of active contrast extravasation.  Intermittent or low grade leakage is not excluded.  Original Report Authenticated By: Marlon Pel, M.D.    MEDICATIONS: Scheduled Meds:    . antiseptic oral rinse  15 mL Mouth Rinse q12n4p  . atorvastatin  10 mg Oral q1800  . chlorhexidine  15 mL Mouth Rinse BID  . digoxin  0.25 mg Oral Daily  . diltiazem  360 mg Oral Daily  . fluocinonide ointment  1 application Topical BID  . hydrocortisone cream   Topical BID  . magnesium citrate  1 Bottle Oral Once  . metoprolol tartrate  25 mg Oral BID  . potassium chloride  10 mEq Intravenous Q1 Hr x 2  . sodium chloride  3 mL Intravenous Q12H  . triamcinolone ointment  1 application Topical Daily  . DISCONTD: digoxin  125 mcg Oral Daily   Continuous Infusions:    . sodium chloride 100 mL (05/10/11 1734)   PRN Meds:.acetaminophen, acetaminophen, morphine, ondansetron (ZOFRAN) IV, ondansetron, oxyCODONE, oxyCODONE-acetaminophen, sodium chloride, zolpidem  Antibiotics: Anti-infectives    None      Assessment/Plan: Patient Active Hospital Problem List: Right leg numbness /pain -is likely secondary to the infiltration/hematoma seen around the right iliac artery aneurysm -has no overt weakness, cant move right leg-but this is due to pain!-he claims to me that the pain continues to get better each day, and he is able to move his leg much more today compared to yesterday -left lower ext is normal and pain free   Supratherapeutic INR  -patient claims no recent dose changes, denies any  antibiotic use as well -INR still 2.19 today, inspite of Vit K repeat dose yesterday  Anemia -from acute blood loss -this is likely GI bleed-from supra-therapeutic INR -will need GI work up prior to recommencing anticoagulation - This is his second episode of bleeding, had a significant hematoma in left lower ext 2008 -Hb now stable after 3 units of PRBC transfusion  GI Bleed -Likely upper given h/o melena -EGD 4/26-results noted, Colonoscopy now scheduled for today  PAF -in and out of [facility] since last evening -resume cardizem -continue with Digoxin -coumadin on hold-may need to think  about transitioning to perhaps Xarelto, given INR more than 10 on admit  appreciate [facility] input  Bilateral Iliac Artery Aneurysms -per [facility]-Angiogram on 05/14/11  HTN (hypertension)  -currently stable off meds  Hypokalemia -will replete  H/O DVT/PE -on chronic coumadin-now off due to Bleeding -talked with Dr Imogene Burn about IVC filter placement-will be placed on 05/14/11 as well  Morbid Obesity: -counseled extensively  Disposition: Remain inpatient  DVT Prophylaxis: SCD's  Code Status Full Code  Maretta Bees, MD. 05/11/2011, 1:01 PM

## 2011-05-11 NOTE — Progress Notes (Signed)
Patient brought to endo for colonoscopy. Patient states his stools are brown liquid. INR 2.19. Procedure cancelled until stools are clear and INR comes down.

## 2011-05-11 NOTE — Progress Notes (Signed)
   CARE MANAGEMENT NOTE 05/11/2011  Patient:  [name], [name]   Account Number:  [id number]  Date Initiated:  05/08/2011  Documentation initiated by:  Donn Pierini  Subjective/Objective Assessment:   Pt admitted with right leg numbness     Action/Plan:   PTA pt lived at home alone, PT eval ordered   Anticipated DC Date:  05/11/2011   Anticipated DC Plan:  SKILLED NURSING FACILITY  In-house referral  Clinical Social Worker      DC Planning Services  CM consult      Choice offered to / List presented to:             Status of service:  In process, will continue to follow Medicare Important Message given?   (If response is "NO", the following Medicare IM given date fields will be blank) Date Medicare IM given:   Date Additional Medicare IM given:    Discharge Disposition:    Per UR Regulation:    If discussed at Long Length of Stay Meetings, dates discussed:    Comments:  PCP-  05/11/11 15:16 Letha Cape RN, BSN [phone] patient for angiogran and ivc filter on Thursday.  Physical therapy recs snf for disposisiton. CSW following.  05/08/11- 1200- Donn Pierini RN, BSN [phone] Possible EGD today ? GIB- subtherapeutic INR - will transfuse FFP today,  PT eval pending- CM to follow for d/c needs-

## 2011-05-11 NOTE — Progress Notes (Signed)
05/10/11 1955 Contacted Kaplan MD to inquire if patient needed prep for possible colonoscopy on Monday.  Order given for Mag. Citrate, one bottle.  Bottle administered and consent signed by patient.  Dahlia Byes Boschen

## 2011-05-11 NOTE — Progress Notes (Signed)
[facility]  Daily Progress Note  Assessment/Planning: B CIA aneurysms, Possible GIB   Awaiting colonoscopy  Per hospitalist, primary team concerned with continued coumadin use as this is the second bleeding complication related to Coumadin.  Pt previously has had a large PE per the hospitalist.  IVC filter placement requested due to risks from continued Coumadin use.  Will aim for Thursday as priority is w/u on GIB meanwhile  I discussed with the patient the risks of angiogram and IVC filter placement. I discussed with the patient the nature of angiographic procedures, especially the limited patencies of any endovascular intervention.  The patient is aware of that the risks of an angiographic procedure include but are not limited to: bleeding, infection, access site complications, renal failure, embolization, rupture of vessel, dissection, possible need for emergent surgical intervention, possible need for surgical procedures to treat the patient's pathology, and stroke and death.   In regards to the IVC filter, we discussed the above and IVC filter migration, IVC perforation, bowel injury, total occlusion, and fracture of the filter.   The patient is aware of the risks and agrees to proceed.  Subjective    Right leg feels better, no BRBPR or melena recently  Objective Filed Vitals:   05/10/11 1054 05/10/11 1354 05/10/11 2155 05/11/11 0547  BP:  127/86 124/76 131/82  Pulse: 79 92 85 75  Temp:  98.2 F (36.8 C) 98.2 F (36.8 C) 98.3 F (36.8 C)  TempSrc:  Oral Oral Oral  Resp:  18 18 18   Height:      Weight:      SpO2:  95% 96% 95%    Intake/Output Summary (Last 24 hours) at 05/11/11 0939 Last data filed at 05/11/11 0836  Gross per 24 hour  Intake 3761.67 ml  Output    902 ml  Net 2859.67 ml    PULM  CTAB CV  RRR GI  soft, NTND VASC  No echymosis in right thigh, no TTP, B feet well perfused  Laboratory CBC    Component Value  Date/Time   WBC 8.6 05/11/2011 0535   HGB 8.4* 05/11/2011 0535   HCT 26.3* 05/11/2011 0535   PLT 325 05/11/2011 0535    BMET    Component Value Date/Time   NA 140 05/11/2011 0535   K 3.6 05/11/2011 0535   CL 105 05/11/2011 0535   CO2 26 05/11/2011 0535   GLUCOSE 94 05/11/2011 0535   BUN 10 05/11/2011 0535   CREATININE 0.91 05/11/2011 0535   CALCIUM 8.3* 05/11/2011 0535   GFRNONAA 90* 05/11/2011 0535   GFRAA >90 05/11/2011 0535    Lab Results  Component Value Date   INR 2.19* 05/11/2011   INR 3.02* 05/10/2011   INR 2.51* 05/09/2011    Leonides Sake, MD [facility] Office: [phone] Pager: [phone]  05/11/2011, 9:39 AM

## 2011-05-12 ENCOUNTER — Inpatient Hospital Stay (HOSPITAL_COMMUNITY): Payer: Medicare Other

## 2011-05-12 ENCOUNTER — Encounter (HOSPITAL_COMMUNITY)
Admission: EM | Disposition: A | Discharge status: Skilled Nursing Facility | Payer: Self-pay | Source: Home / Self Care | Attending: Internal Medicine

## 2011-05-12 ENCOUNTER — Encounter (HOSPITAL_COMMUNITY): Payer: Self-pay | Admitting: Gastroenterology

## 2011-05-12 HISTORY — PX: COLONOSCOPY: SHX5424

## 2011-05-12 LAB — BASIC METABOLIC PANEL
BUN: 8 mg/dL (ref 6–23)
Calcium: 8.2 mg/dL — ABNORMAL LOW (ref 8.4–10.5)
GFR calc Af Amer: >90 mL/min (ref >90)
GFR calc non Af Amer: >90 mL/min (ref >90)
Glucose, Bld: 89 mg/dL (ref 70–99)
Potassium: 3.4 mEq/L — ABNORMAL LOW (ref 3.5–5.1)
Sodium: 139 mEq/L (ref 135–145)

## 2011-05-12 LAB — CBC
Hemoglobin: 8.4 g/dL — ABNORMAL LOW (ref 13.0–17.0)
MCH: 28.2 pg (ref 26.0–34.0)
MCHC: 32.1 g/dL (ref 30.0–36.0)
RDW: 18.8 % — ABNORMAL HIGH (ref 11.5–15.5)

## 2011-05-12 LAB — PROTIME-INR: INR: 1.93 — ABNORMAL HIGH (ref 0.00–1.49)

## 2011-05-12 SURGERY — COLONOSCOPY
Anesthesia: Moderate Sedation

## 2011-05-12 MED ORDER — DIPHENHYDRAMINE HCL 50 MG/ML IJ SOLN
INTRAMUSCULAR | Status: AC
  Filled 2011-05-12: qty 1

## 2011-05-12 MED ORDER — ENOXAPARIN SODIUM 80 MG/0.8ML ~~LOC~~ SOLN
80.0000 mg | SUBCUTANEOUS | Status: DC
  Administered 2011-05-12 – 2011-05-14 (×3): 80 mg via SUBCUTANEOUS
  Filled 2011-05-12 (×4): qty 0.8

## 2011-05-12 MED ORDER — MIDAZOLAM HCL 5 MG/5ML IJ SOLN
INTRAMUSCULAR | Status: DC | PRN
  Administered 2011-05-12 (×2): 2 mg via INTRAVENOUS
  Administered 2011-05-12: 1 mg via INTRAVENOUS
  Administered 2011-05-12: 2 mg via INTRAVENOUS

## 2011-05-12 MED ORDER — FENTANYL NICU IV SYRINGE 50 MCG/ML
INJECTION | INTRAMUSCULAR | Status: DC | PRN
  Administered 2011-05-12 (×3): 25 ug via INTRAVENOUS

## 2011-05-12 MED ORDER — ENOXAPARIN SODIUM 40 MG/0.4ML ~~LOC~~ SOLN
40.0000 mg | SUBCUTANEOUS | Status: DC
  Filled 2011-05-12: qty 0.4

## 2011-05-12 MED ORDER — MIDAZOLAM HCL 10 MG/2ML IJ SOLN
INTRAMUSCULAR | Status: AC
  Filled 2011-05-12: qty 4

## 2011-05-12 MED ORDER — FENTANYL CITRATE 0.05 MG/ML IJ SOLN
INTRAMUSCULAR | Status: AC
  Filled 2011-05-12: qty 4

## 2011-05-12 NOTE — Progress Notes (Signed)
Physical Therapy Treatment Patient Details Name: [name] MRN: [id number] DOB: [birth year] Today's Date: 05/12/2011 Time: 4098-1191 PT Time Calculation (min): 19 min  PT Assessment / Plan / Recommendation Comments on Treatment Session  Pt with improved function of RLE but fell with knees buckling when attempting to amb.  Will plan to use the [facility] next time for transfers/gait.    Follow Up Recommendations  Skilled nursing facility    Equipment Recommendations  Defer to next venue    Frequency Min 3X/week   Plan Discharge plan remains appropriate    Precautions / Restrictions Precautions Precautions: Fall Restrictions Weight Bearing Restrictions: No   Pertinent Vitals/Pain No pain     Mobility  Bed Mobility Bed Mobility: Supine to Sit;Rolling Right Rolling Right: 5: Supervision;With rail Rolling Left: 5: Supervision;With rail Supine to Sit: 4: Min assist;HOB flat Details for Bed Mobility Assistance: Pt able to move his RLE off EOB without assistance.  Min A to steady trunk only. Transfers Transfers: Sit to Stand Sit to Stand: 1: +2 Total assist Sit to Stand: Patient Percentage: 90% Transfer via Lift Equipment: Maximove Details for Transfer Assistance: After fall used maximove to assist pt from floor back to bed. Ambulation/Gait Ambulation/Gait Assistance: 1: +2 Total assist Assistive device: Rolling walker General Gait Details: Pt stood at bedside with with rolling walker x 30 sec with +2 total (pt=95%).  Pt performed wt shifting while in standing with +2 total (95%) without difficulty.  Pt began to take 1 step forward and knees buckled with pt going to the floor on his knees.  Pt assisted from knees to hands and knees position.  Then assisted to lt sidelying on floor.  Used maximove to get pt from floor to bed.    Exercises     PT Goals Acute Rehab PT Goals PT Goal: Rolling Supine to Right Side - Progress: Progressing toward goal PT Goal: Rolling Supine to  Left Side - Progress: Progressing toward goal PT Goal: Supine/Side to Sit - Progress: Progressing toward goal PT Goal: Sit to Stand - Progress: Progressing toward goal PT Goal: Stand to Sit - Progress: Progressing toward goal PT Transfer Goal: Bed to Chair/Chair to Bed - Progress: Progressing toward goal PT Goal: Ambulate - Progress: Progressing toward goal  Visit Information  Last PT Received On: 05/12/11 Assistance Needed: +2    Subjective Data  Subjective: "I thought I was going to get to sit in the chair," pt said dissappointedly after back to bed after fall.   Cognition  Overall Cognitive Status: Appears within functional limits for tasks assessed/performed Arousal/Alertness: Awake/alert Orientation Level: Appears intact for tasks assessed Behavior During Session: [facility] for tasks performed Cognition - Other Comments: No impulsivity or decr in safety awareness noted today.    Balance  Static Sitting Balance Static Sitting - Balance Support: No upper extremity supported Static Sitting - Level of Assistance: 6: Modified independent (Device/Increase time) Static Standing Balance Static Standing - Balance Support: Bilateral upper extremity supported (on walker) Static Standing - Level of Assistance: 1: +2 Total assist;Patient percentage (comment) (pt=95%)  End of Session PT - End of Session Activity Tolerance: Other (comment) (pt with fall) Patient left: in bed;with call bell/phone within reach;with bed alarm set Nurse Communication: Mobility status;Need for lift equipment    Courtnay Petrilla 05/12/2011, 11:15 AM   [facility] PT [phone]

## 2011-05-12 NOTE — H&P (View-Only) (Signed)
PATIENT DETAILS Name: [name] Age: 61 y.o. Sex: male Date of Birth: 11-08-1950 Admit Date: 05/07/2011 PCP:No primary provider on file.  Interim History Patient is a 61 yo male-h/o large PE, PAF-on chronic coumadin therapy presented with sudden onset of Right Thigh pain/numbness. He was found to have a  INR >10 and  Hb of 7 on admit. Further CT Scans did not show any retroperitoneal bleed, but showed Infiltration of fat about the iliopsoas muscle in the proximal thigh with some edematous change within the muscle belly.A CTA of the abdomen demonstrated Bilateral iliac artery aneurysms  with infiltration or hematoma in the fat around the right iliac aneurysm and in the iliopsoas and piriformis muscles. There was no definite evidence of active contrast extravasation. He also gave a h/o of melena as well. He was subsequently admitted, given FFP, Vit K and PRBC transfusion. His Hemoglobin is now stable, he has no further melena, a EGD did not show significant abnormalities. A colonoscopy is currently pending. [facility] has scheduled the patient for a IVC filter placement and Peripheral angiograms on 5/2. His right thigh pain is better, but still has not resolved  Subjective: Right hip/thigh pain better-able to move legs more today-both knees gave out with PT today-and subsequently fell  Objective: Vital signs in last 24 hours: Filed Vitals:   05/11/11 2138 05/11/11 2318 05/12/11 0443 05/12/11 1106  BP: 168/96 147/93 143/65 137/81  Pulse: 78  62 66  Temp: 97.8 F (36.6 C)  98 F (36.7 C)   TempSrc: Oral  Oral   Resp: 20  18   Height:      Weight:      SpO2: 98%  98%     Weight change:   Body mass index is 44.04 kg/(m^2).  Intake/Output from previous day:  Intake/Output Summary (Last 24 hours) at 05/12/11 1249 Last data filed at 05/12/11 0600  Gross per 24 hour  Intake   6150 ml  Output    501 ml  Net   5649 ml    PHYSICAL EXAM: Gen Exam: Awake and alert with clear speech.     Neck: Supple, No JVD.   Chest: B/L Clear. No rhonci or rales heared CVS: S1 S2 Regular, no murmurs. Abdomen: soft, BS +, non tender, non distended.  Extremities: no edema, lower extremities warm to touch.Right thigh-not distended/swollen Neurologic: Non Focal.   Skin: No Rash.   Wounds: N/A.    CONSULTS:  GI and vascular surgery  LAB RESULTS: CBC  Lab 05/12/11 0647 05/11/11 0535 05/10/11 0540 05/09/11 0703 05/08/11 2328  WBC 8.0 8.6 9.0 9.6 11.4*  HGB 8.4* 8.4* 8.5* 8.4* 8.2*  HCT 26.2* 26.3* 26.3* 25.6* 24.9*  PLT 335 325 328 310 294  MCV 87.9 87.7 86.5 87.7 86.5  MCH 28.2 28.0 28.0 28.8 28.5  MCHC 32.1 31.9 32.3 32.8 32.9  RDW 18.8* 19.2* 19.6* 20.5* 20.3*  LYMPHSABS -- -- -- -- --  MONOABS -- -- -- -- --  EOSABS -- -- -- -- --  BASOSABS -- -- -- -- --  BANDABS -- -- -- -- --    Chemistries   Lab 05/12/11 0647 05/11/11 0535 05/10/11 0540 05/09/11 0703 05/07/11 0823 05/07/11 0534  NA 139 140 140 139 -- 140  K 3.4* 3.6 3.1* 3.7 -- 3.4*  CL 104 105 104 102 -- 106  CO2 25 26 25 25  -- --  GLUCOSE 89 94 95 91 -- 119*  BUN 8 10 9 10  -- 20  CREATININE 0.80 0.91 0.76 0.86 -- 1.10  CALCIUM 8.2* 8.3* 8.3* 8.4 -- --  MG -- -- -- -- 2.0 --    GFR Estimated Creatinine Clearance: 153.1 ml/min (by C-G formula based on Cr of 0.8).  Coagulation profile  Lab 05/12/11 0647 05/11/11 0535 05/10/11 0540 05/09/11 0703 05/08/11 0929  INR 1.93* 2.19* 3.02* 2.51* 2.15*  PROTIME -- -- -- -- --    Cardiac Enzymes No results found for this basename: CK:3,CKMB:3,TROPONINI:3,MYOGLOBIN:3 in the last 168 hours  No components found with this basename: POCBNP:3 No results found for this basename: DDIMER:2 in the last 72 hours No results found for this basename: HGBA1C:2 in the last 72 hours No results found for this basename: CHOL:2,HDL:2,LDLCALC:2,TRIG:2,CHOLHDL:2,LDLDIRECT:2 in the last 72 hours No results found for this basename: TSH,T4TOTAL,FREET3,T3FREE,THYROIDAB in the last 72  hours No results found for this basename: VITAMINB12:2,FOLATE:2,FERRITIN:2,TIBC:2,IRON:2,RETICCTPCT:2 in the last 72 hours No results found for this basename: LIPASE:2,AMYLASE:2 in the last 72 hours  Urine Studies No results found for this basename: UACOL:2,UAPR:2,USPG:2,UPH:2,UTP:2,UGL:2,UKET:2,UBIL:2,UHGB:2,UNIT:2,UROB:2,ULEU:2,UEPI:2,UWBC:2,URBC:2,UBAC:2,CAST:2,CRYS:2,UCOM:2,BILUA:2 in the last 72 hours  MICROBIOLOGY: No results found for this or any previous visit (from the past 240 hour(s)).  RADIOLOGY STUDIES/RESULTS: Ct Abdomen Pelvis Wo Contrast  05/08/2011  *RADIOLOGY REPORT*  Clinical Data: Leg numbness.  Anemia.  Rule out retroperitoneal bleed.  CT ABDOMEN AND PELVIS WITHOUT CONTRAST  Technique:  Multidetector CT imaging of the abdomen and pelvis was performed following the standard protocol without intravenous contrast.  Comparison: CT pelvis 05/07/2011.  CT abdomen and pelvis 08/20/2006  Findings: Slight fibrosis in the lung bases.  Small esophageal hiatal hernia.  Circumscribed cyst in the anterior right lobe of the liver is stable since the prior study.  Unenhanced appearance of the liver, spleen, gallbladder, pancreas, and adrenal glands is otherwise unremarkable.  There is increased density in the renal collecting systems, ureters, and bladder bilaterally suggesting previous contrast administration.  Mild calcification of the abdominal aorta.  Bilateral iliac artery aneurysms, measuring 3.2 cm on the right and 3.6 cm on the left.  There is infiltration around the right iliac artery aneurysm and along the iliopsoas muscle suggesting edema, inflammation, or hematoma, possibly due to rupture.  Asymmetric enlargement of the right piriformis muscle with a vague low attenuation change.  This might represent intramuscular fluid collection or hematoma. Moderate sized umbilical hernia containing fat.  No free air or free fluid in the abdomen.  Stomach, small bowel, and colon are not distended.   Pelvis:  Prostate gland is not enlarged.  Bladder wall is not thickened.  No significant pelvic lymphadenopathy.  Degenerative changes in the lumbar spine with normal alignment.  IMPRESSION: Infiltration around the right iliopsoas muscle with asymmetric enlargement of the iliopsoas and piriformis muscles.  Suggestion of low attenuation change in the right piriformis muscle.  Changes may represent inflammatory edema or hematoma.  There is a right iliac artery aneurysm and rupture is not excluded.  There is also a left iliac artery aneurysm.  Results discussed by telephone at the time of dictation, 0209 hours on 05/08/2011 with  the patient's nurse, Lamar Laundry,  who aknowledged these results and will contact the referring physician.  Original Report Authenticated By: Marlon Pel, M.D.   Dg Chest 1 View  04/16/2011  *RADIOLOGY REPORT*  Clinical Data: [employer] study.  CHEST - 1 VIEW  Comparison: 05/01/2010  Findings: Heart and mediastinal contours are within normal limits. No focal opacities or effusions.  No acute bony abnormality.  IMPRESSION: No active cardiopulmonary disease.  Original  Report Authenticated By: Cyndie Chime, M.D.   Dg Chest 2 View  05/07/2011  *RADIOLOGY REPORT*  Clinical Data: Right thigh pain since 10:00 a.m.  No known injury. Shortness of breath.  Numbness.  CHEST - 2 VIEW  Comparison: 04/16/2011  Findings: Shallow inspiration.  Mild cardiac enlargement.  Normal pulmonary vascularity.  No focal airspace consolidation or edema. No blunting of the costophrenic angles.  No pneumothorax. Degenerative changes in the thoracic spine.  No significant change since previous study.  IMPRESSION: No evidence of active pulmonary disease.  Original Report Authenticated By: Marlon Pel, M.D.   Ct Head Wo Contrast  05/07/2011  *RADIOLOGY REPORT*  Clinical Data: Right leg numbness. Light bothers eyes.  CT HEAD WITHOUT CONTRAST  Technique:  Contiguous axial images were obtained from the base of  the skull through the vertex without contrast.  Comparison: None.  Findings: The ventricles and sulci appear symmetrical.  No mass effect or midline shift.  No abnormal extra-axial fluid collections.  Vague focal area of low attenuation in the left parietal white matter posterior to the sylvian fissure.  This may represent partial voluming with the posterior horn of the left lateral ventricle but a focal area of early ischemia can also have this appearance.  No evidence of acute intracranial hemorrhage. Gray-white matter junctions are distinct.  Basal cisterns are not effaced.  No depressed skull fractures.  Vascular calcifications. Visualized paranasal sinuses and mastoid air cells are not opacified.  Old nasal bone fracture.  IMPRESSION: Nonspecific low attenuation area in the left posterior parietal white matter which could represent early ischemic focus versus partial voluming.  No significant mass effect.  No acute intracranial hemorrhage.  Original Report Authenticated By: Marlon Pel, M.D.   Ct Femur Right W Contrast  05/07/2011  *RADIOLOGY REPORT*  Clinical Data: Right thigh pain.  History of abscess.  CT OF THE RIGHT LOWER EXTREMITY WITH CONTRAST  Contrast: OMNIPAQUE IOHEXOL 300 MG/ML  SOLN  Comparison: CT abdomen and pelvis 08/20/2006.  Findings: No abscess is identified.  There is infiltration of fat about the iliopsoas muscle belly and the muscle appears mildly edematous.  Ossification of the iliopsoas tendon at the lesser trochanter is compatible with old injury/enthesopathic change. Visualized muscles and tendons otherwise are normal in appearance. Subcutaneous fatty tissues are normal in appearance.  No mass is identified.  No bony destructive change or fracture is seen.  Marked appearing degenerative disease about the right knee is noted with a small joint effusion is seen. There is a loose body in the posterior aspect of the joint measuring 1.5 cm in diameter.  Imaged intrapelvic  contents demonstrate a right external iliac node measuring 1.5 cm short axis dimension on image 8.  IMPRESSION:  1.  Negative for abscess. 2.  Infiltration of fat about the iliopsoas muscle in the proximal thigh with some edematous change within the muscle belly could be due to infectious or inflammatory change.  Muscle strain could create a similar appearance.  Enthesopathic change of the iliopsoas tendon is again noted. 3.  Small right external iliac lymph node is likely reactive. 4.  Advanced degenerative disease about the right knee.  Original Report Authenticated By: Bernadene Bell. D'ALESSIO, M.D.   Dg Shoulder Left  05/01/2011  *RADIOLOGY REPORT*  Clinical Data: Pain post crushing injury  LEFT SHOULDER - 2+ VIEW  Comparison: None.  Findings: Three views of the left shoulder submitted.  No acute fracture or subluxation.  No radiopaque foreign body.  IMPRESSION: No acute fracture or subluxation.  Original Report Authenticated By: Natasha Mead, M.D.   Dg Humerus Left  05/01/2011  *RADIOLOGY REPORT*  Clinical Data: Injured left arm and shoulder approximately 1 week ago while pulling in self out of a car.  Patient unable to adduct the left arm above chest level.  LEFT HUMERUS - 2+ VIEW 05/01/2011:  Comparison: Left shoulder x-rays obtained concurrently.  Findings: No evidence of acute, subacute, or healed fractures.  No intrinsic osseous abnormalities involving the humerus.  Well- preserved bone mineral density.  Visualized elbow joint intact.  IMPRESSION: Normal examination.  Original Report Authenticated By: Arnell Sieving, M.D.   Ct Angio Abd/pel W/ And/or W/o  05/08/2011  *RADIOLOGY REPORT*  Clinical Data: Left leg weakness and pain.  Known iliac aneurysms with infiltration or hematoma in the right iliopsoas region on prior unenhanced CT.  Decreasing hemoglobin with multiple transfusions.  CT ANGIOGRAPHY ABDOMEN AND PELVIS WITH CONTRAST AND WITHOUT CONTRAST  Comparison: Unenhanced CT 05/08/2011  Findings:  The study is technically limited due to the patient's body habitus.  There is overlying artifact centered at the area of interest.  This limits visualization of this area.  However, there appears to be flow throughout the right and left iliac artery aneurysms without evidence of thrombosis or occlusion.  There is no obvious contrast extravasation although a small focus of active hemorrhage could be obscured.  Again demonstrated is a infiltration or hematoma around the right iliac artery aneurysm and in the iliopsoas recent extending down to the growing and in the piriformis muscles as before.  No significant increase.  The lung bases are clear.  Cyst again demonstrated in the liver. Cyst in the right kidney.  No hydronephrosis.  Normal homogeneous parenchymal nephrograms.  The gallbladder, pancreas, spleen, adrenal glands, the stomach, small bowel, colon, and retroperitoneal lymph nodes are unremarkable.  Small esophageal hiatal hernia.  Calcification of the aorta.  Broad-based umbilical hernia containing fat.  Pelvis:  Bladder wall is not thickened.  No significant pelvic lymphadenopathy.  No evidence of loculated fluid collection.  The appendix is normal.  IMPRESSION: Technically limited study due to the patient's body habitus. Bilateral iliac artery aneurysms are again demonstrated with infiltration or hematoma in the fat around the right iliac aneurysm and in the iliopsoas and piriformis muscles.  No definite evidence of active contrast extravasation.  Intermittent or low grade leakage is not excluded.  Original Report Authenticated By: Marlon Pel, M.D.    MEDICATIONS: Scheduled Meds:    . antiseptic oral rinse  15 mL Mouth Rinse q12n4p  . atorvastatin  10 mg Oral q1800  . chlorhexidine  15 mL Mouth Rinse BID  . digoxin  0.25 mg Oral Daily  . diltiazem  360 mg Oral Daily  . fluocinonide ointment  1 application Topical BID  . hydrocortisone cream   Topical BID  . metoprolol tartrate  25 mg  Oral BID  . phytonadione  5 mg Oral Once  . polyethylene glycol-electrolytes  4,000 mL Oral Once  . sodium chloride  3 mL Intravenous Q12H  . triamcinolone ointment  1 application Topical Daily   Continuous Infusions:    . sodium chloride 1,000 mL (05/12/11 0907)   PRN Meds:.acetaminophen, acetaminophen, morphine, ondansetron (ZOFRAN) IV, ondansetron, oxyCODONE, oxyCODONE-acetaminophen, sodium chloride, zolpidem  Antibiotics: Anti-infectives    None      Assessment/Plan: Patient Active Hospital Problem List: Right leg numbness /pain -is likely secondary to the infiltration/hematoma seen around the right iliac  artery aneurysm -has no overt weakness, cant move right leg-but this is due to pain!-he claims to me that the pain continues to get better each day, and he is able to move his leg much more today -however-fell today with PT-as both knees buckled-monitor for now. Given persistent pain will repeat a CT of the Right thigh area. -left lower ext is normal and pain free   Supratherapeutic INR  -patient claims no recent dose changes, denies any antibiotic use as well -INR still 1.93 today-for Colonoscopy today  Anemia -from acute blood loss -this is likely GI bleed-from supra-therapeutic INR -will need GI work up prior to recommencing anticoagulation - This is his second episode of bleeding, had a significant hematoma in left lower ext 2008 -Hb now stable after 3 units of PRBC transfusion  GI Bleed -Likely upper given h/o melena -EGD 4/26-results noted, Colonoscopy now scheduled for today  PAF -in and out of [place] since last evening -resume cardizem -continue with Digoxin and lopressor -coumadin on hold-may need to think about transitioning to perhaps Xarelto, given INR more than 10 on admit  appreciate [facility] input  Bilateral Iliac Artery Aneurysms -per [facility]-Angiogram on 05/14/11  HTN (hypertension)  -currently stable off meds  Hypokalemia -will replete  H/O  DVT/PE -on chronic coumadin-now off due to Bleeding -talked with Dr Imogene Burn about IVC filter placement-will be placed on 05/14/11 as well  Morbid Obesity: -counseled extensively  Disposition: Remain inpatient  DVT Prophylaxis: SCD's  Code Status Full Code  Maretta Bees, MD. 05/12/2011, 12:49 PM

## 2011-05-12 NOTE — Interval H&P Note (Signed)
History and Physical Interval Note:  05/12/2011 3:43 PM  [name]  has presented today for surgery, with the diagnosis of melena  The various methods of treatment have been discussed with the patient and family. After consideration of risks, benefits and other options for treatment, the patient has consented to  Procedure(s) (LRB): COLONOSCOPY (N/A) as a surgical intervention .  The patients' history has been reviewed, patient examined, no change in status, stable for surgery.  I have reviewed the patients' chart and labs.  Questions were answered to the patient's satisfaction.     [name]

## 2011-05-12 NOTE — Progress Notes (Signed)
PATIENT DETAILS Name: [name] Age: 61 y.o. Sex: male Date of Birth: 04/16/1950 Admit Date: 05/07/2011 PCP:No primary provider on file.  Interim History Patient is a 61 yo male-h/o large PE, PAF-on chronic coumadin therapy presented with sudden onset of Right Thigh pain/numbness. He was found to have a  INR >10 and  Hb of 7 on admit. Further CT Scans did not show any retroperitoneal bleed, but showed Infiltration of fat about the iliopsoas muscle in the proximal thigh with some edematous change within the muscle belly.A CTA of the abdomen demonstrated Bilateral iliac artery aneurysms  with infiltration or hematoma in the fat around the right iliac aneurysm and in the iliopsoas and piriformis muscles. There was no definite evidence of active contrast extravasation. He also gave a h/o of melena as well. He was subsequently admitted, given FFP, Vit K and PRBC transfusion. His Hemoglobin is now stable, he has no further melena, a EGD did not show significant abnormalities. A colonoscopy is currently pending. [facility] has scheduled the patient for a IVC filter placement and Peripheral angiograms on 5/2. His right thigh pain is better, but still has not resolved  Subjective: Right hip/thigh pain better-able to move legs more today-both knees gave out with PT today-and subsequently fell  Objective: Vital signs in last 24 hours: Filed Vitals:   05/11/11 2138 05/11/11 2318 05/12/11 0443 05/12/11 1106  BP: 168/96 147/93 143/65 137/81  Pulse: 78  62 66  Temp: 97.8 F (36.6 C)  98 F (36.7 C)   TempSrc: Oral  Oral   Resp: 20  18   Height:      Weight:      SpO2: 98%  98%     Weight change:   Body mass index is 44.04 kg/(m^2).  Intake/Output from previous day:  Intake/Output Summary (Last 24 hours) at 05/12/11 1249 Last data filed at 05/12/11 0600  Gross per 24 hour  Intake   6150 ml  Output    501 ml  Net   5649 ml    PHYSICAL EXAM: Gen Exam: Awake and alert with clear speech.     Neck: Supple, No JVD.   Chest: B/L Clear. No rhonci or rales heared CVS: S1 S2 Regular, no murmurs. Abdomen: soft, BS +, non tender, non distended.  Extremities: no edema, lower extremities warm to touch.Right thigh-not distended/swollen Neurologic: Non Focal.   Skin: No Rash.   Wounds: N/A.    CONSULTS:  GI and vascular surgery  LAB RESULTS: CBC  Lab 05/12/11 0647 05/11/11 0535 05/10/11 0540 05/09/11 0703 05/08/11 2328  WBC 8.0 8.6 9.0 9.6 11.4*  HGB 8.4* 8.4* 8.5* 8.4* 8.2*  HCT 26.2* 26.3* 26.3* 25.6* 24.9*  PLT 335 325 328 310 294  MCV 87.9 87.7 86.5 87.7 86.5  MCH 28.2 28.0 28.0 28.8 28.5  MCHC 32.1 31.9 32.3 32.8 32.9  RDW 18.8* 19.2* 19.6* 20.5* 20.3*  LYMPHSABS -- -- -- -- --  MONOABS -- -- -- -- --  EOSABS -- -- -- -- --  BASOSABS -- -- -- -- --  BANDABS -- -- -- -- --    Chemistries   Lab 05/12/11 0647 05/11/11 0535 05/10/11 0540 05/09/11 0703 05/07/11 0823 05/07/11 0534  NA 139 140 140 139 -- 140  K 3.4* 3.6 3.1* 3.7 -- 3.4*  CL 104 105 104 102 -- 106  CO2 25 26 25 25 -- --  GLUCOSE 89 94 95 91 -- 119*  BUN 8 10 9 10 -- 20    CREATININE 0.80 0.91 0.76 0.86 -- 1.10  CALCIUM 8.2* 8.3* 8.3* 8.4 -- --  MG -- -- -- -- 2.0 --    GFR Estimated Creatinine Clearance: 153.1 ml/min (by C-G formula based on Cr of 0.8).  Coagulation profile  Lab 05/12/11 0647 05/11/11 0535 05/10/11 0540 05/09/11 0703 05/08/11 0929  INR 1.93* 2.19* 3.02* 2.51* 2.15*  PROTIME -- -- -- -- --    Cardiac Enzymes No results found for this basename: CK:3,CKMB:3,TROPONINI:3,MYOGLOBIN:3 in the last 168 hours  No components found with this basename: POCBNP:3 No results found for this basename: DDIMER:2 in the last 72 hours No results found for this basename: HGBA1C:2 in the last 72 hours No results found for this basename: CHOL:2,HDL:2,LDLCALC:2,TRIG:2,CHOLHDL:2,LDLDIRECT:2 in the last 72 hours No results found for this basename: TSH,T4TOTAL,FREET3,T3FREE,THYROIDAB in the last 72  hours No results found for this basename: VITAMINB12:2,FOLATE:2,FERRITIN:2,TIBC:2,IRON:2,RETICCTPCT:2 in the last 72 hours No results found for this basename: LIPASE:2,AMYLASE:2 in the last 72 hours  Urine Studies No results found for this basename: UACOL:2,UAPR:2,USPG:2,UPH:2,UTP:2,UGL:2,UKET:2,UBIL:2,UHGB:2,UNIT:2,UROB:2,ULEU:2,UEPI:2,UWBC:2,URBC:2,UBAC:2,CAST:2,CRYS:2,UCOM:2,BILUA:2 in the last 72 hours  MICROBIOLOGY: No results found for this or any previous visit (from the past 240 hour(s)).  RADIOLOGY STUDIES/RESULTS: Ct Abdomen Pelvis Wo Contrast  05/08/2011  *RADIOLOGY REPORT*  Clinical Data: Leg numbness.  Anemia.  Rule out retroperitoneal bleed.  CT ABDOMEN AND PELVIS WITHOUT CONTRAST  Technique:  Multidetector CT imaging of the abdomen and pelvis was performed following the standard protocol without intravenous contrast.  Comparison: CT pelvis 05/07/2011.  CT abdomen and pelvis 08/20/2006  Findings: Slight fibrosis in the lung bases.  Small esophageal hiatal hernia.  Circumscribed cyst in the anterior right lobe of the liver is stable since the prior study.  Unenhanced appearance of the liver, spleen, gallbladder, pancreas, and adrenal glands is otherwise unremarkable.  There is increased density in the renal collecting systems, ureters, and bladder bilaterally suggesting previous contrast administration.  Mild calcification of the abdominal aorta.  Bilateral iliac artery aneurysms, measuring 3.2 cm on the right and 3.6 cm on the left.  There is infiltration around the right iliac artery aneurysm and along the iliopsoas muscle suggesting edema, inflammation, or hematoma, possibly due to rupture.  Asymmetric enlargement of the right piriformis muscle with a vague low attenuation change.  This might represent intramuscular fluid collection or hematoma. Moderate sized umbilical hernia containing fat.  No free air or free fluid in the abdomen.  Stomach, small bowel, and colon are not distended.   Pelvis:  Prostate gland is not enlarged.  Bladder wall is not thickened.  No significant pelvic lymphadenopathy.  Degenerative changes in the lumbar spine with normal alignment.  IMPRESSION: Infiltration around the right iliopsoas muscle with asymmetric enlargement of the iliopsoas and piriformis muscles.  Suggestion of low attenuation change in the right piriformis muscle.  Changes may represent inflammatory edema or hematoma.  There is a right iliac artery aneurysm and rupture is not excluded.  There is also a left iliac artery aneurysm.  Results discussed by telephone at the time of dictation, 0209 hours on 05/08/2011 with  the patient's nurse, Sonya,  who aknowledged these results and will contact the referring physician.  Original Report Authenticated By: WILLIAM R. STEVENS, M.D.   Dg Chest 1 View  04/16/2011  *RADIOLOGY REPORT*  Clinical Data: Pharm[employer] study.  CHEST - 1 VIEW  Comparison: 05/01/2010  Findings: Heart and mediastinal contours are within normal limits. No focal opacities or effusions.  No acute bony abnormality.  IMPRESSION: No active cardiopulmonary disease.  Original   Report Authenticated By: KEVIN G. DOVER, M.D.   Dg Chest 2 View  05/07/2011  *RADIOLOGY REPORT*  Clinical Data: Right thigh pain since 10:00 a.m.  No known injury. Shortness of breath.  Numbness.  CHEST - 2 VIEW  Comparison: 04/16/2011  Findings: Shallow inspiration.  Mild cardiac enlargement.  Normal pulmonary vascularity.  No focal airspace consolidation or edema. No blunting of the costophrenic angles.  No pneumothorax. Degenerative changes in the thoracic spine.  No significant change since previous study.  IMPRESSION: No evidence of active pulmonary disease.  Original Report Authenticated By: WILLIAM R. STEVENS, M.D.   Ct Head Wo Contrast  05/07/2011  *RADIOLOGY REPORT*  Clinical Data: Right leg numbness. Light bothers eyes.  CT HEAD WITHOUT CONTRAST  Technique:  Contiguous axial images were obtained from the base of  the skull through the vertex without contrast.  Comparison: None.  Findings: The ventricles and sulci appear symmetrical.  No mass effect or midline shift.  No abnormal extra-axial fluid collections.  Vague focal area of low attenuation in the left parietal white matter posterior to the sylvian fissure.  This may represent partial voluming with the posterior horn of the left lateral ventricle but a focal area of early ischemia can also have this appearance.  No evidence of acute intracranial hemorrhage. Gray-white matter junctions are distinct.  Basal cisterns are not effaced.  No depressed skull fractures.  Vascular calcifications. Visualized paranasal sinuses and mastoid air cells are not opacified.  Old nasal bone fracture.  IMPRESSION: Nonspecific low attenuation area in the left posterior parietal white matter which could represent early ischemic focus versus partial voluming.  No significant mass effect.  No acute intracranial hemorrhage.  Original Report Authenticated By: WILLIAM R. STEVENS, M.D.   Ct Femur Right W Contrast  05/07/2011  *RADIOLOGY REPORT*  Clinical Data: Right thigh pain.  History of abscess.  CT OF THE RIGHT LOWER EXTREMITY WITH CONTRAST  Contrast: 100mL OMNIPAQUE IOHEXOL 300 MG/ML  SOLN  Comparison: CT abdomen and pelvis 08/20/2006.  Findings: No abscess is identified.  There is infiltration of fat about the iliopsoas muscle belly and the muscle appears mildly edematous.  Ossification of the iliopsoas tendon at the lesser trochanter is compatible with old injury/enthesopathic change. Visualized muscles and tendons otherwise are normal in appearance. Subcutaneous fatty tissues are normal in appearance.  No mass is identified.  No bony destructive change or fracture is seen.  Marked appearing degenerative disease about the right knee is noted with a small joint effusion is seen. There is a loose body in the posterior aspect of the joint measuring 1.5 cm in diameter.  Imaged intrapelvic  contents demonstrate a right external iliac node measuring 1.5 cm short axis dimension on image 8.  IMPRESSION:  1.  Negative for abscess. 2.  Infiltration of fat about the iliopsoas muscle in the proximal thigh with some edematous change within the muscle belly could be due to infectious or inflammatory change.  Muscle strain could create a similar appearance.  Enthesopathic change of the iliopsoas tendon is again noted. 3.  Small right external iliac lymph node is likely reactive. 4.  Advanced degenerative disease about the right knee.  Original Report Authenticated By: THOMAS L. D'ALESSIO, M.D.   Dg Shoulder Left  05/01/2011  *RADIOLOGY REPORT*  Clinical Data: Pain post crushing injury  LEFT SHOULDER - 2+ VIEW  Comparison: None.  Findings: Three views of the left shoulder submitted.  No acute fracture or subluxation.  No radiopaque foreign body.    IMPRESSION: No acute fracture or subluxation.  Original Report Authenticated By: LIVIU POP, M.D.   Dg Humerus Left  05/01/2011  *RADIOLOGY REPORT*  Clinical Data: Injured left arm and shoulder approximately 1 week ago while pulling in self out of a car.  Patient unable to adduct the left arm above chest level.  LEFT HUMERUS - 2+ VIEW 05/01/2011:  Comparison: Left shoulder x-rays obtained concurrently.  Findings: No evidence of acute, subacute, or healed fractures.  No intrinsic osseous abnormalities involving the humerus.  Well- preserved bone mineral density.  Visualized elbow joint intact.  IMPRESSION: Normal examination.  Original Report Authenticated By: THOMAS E. LAWRENCE, M.D.   Ct Angio Abd/pel W/ And/or W/o  05/08/2011  *RADIOLOGY REPORT*  Clinical Data: Left leg weakness and pain.  Known iliac aneurysms with infiltration or hematoma in the right iliopsoas region on prior unenhanced CT.  Decreasing hemoglobin with multiple transfusions.  CT ANGIOGRAPHY ABDOMEN AND PELVIS WITH CONTRAST AND WITHOUT CONTRAST  Comparison: Unenhanced CT 05/08/2011  Findings:  The study is technically limited due to the patient's body habitus.  There is overlying artifact centered at the area of interest.  This limits visualization of this area.  However, there appears to be flow throughout the right and left iliac artery aneurysms without evidence of thrombosis or occlusion.  There is no obvious contrast extravasation although a small focus of active hemorrhage could be obscured.  Again demonstrated is a infiltration or hematoma around the right iliac artery aneurysm and in the iliopsoas recent extending down to the growing and in the piriformis muscles as before.  No significant increase.  The lung bases are clear.  Cyst again demonstrated in the liver. Cyst in the right kidney.  No hydronephrosis.  Normal homogeneous parenchymal nephrograms.  The gallbladder, pancreas, spleen, adrenal glands, the stomach, small bowel, colon, and retroperitoneal lymph nodes are unremarkable.  Small esophageal hiatal hernia.  Calcification of the aorta.  Broad-based umbilical hernia containing fat.  Pelvis:  Bladder wall is not thickened.  No significant pelvic lymphadenopathy.  No evidence of loculated fluid collection.  The appendix is normal.  IMPRESSION: Technically limited study due to the patient's body habitus. Bilateral iliac artery aneurysms are again demonstrated with infiltration or hematoma in the fat around the right iliac aneurysm and in the iliopsoas and piriformis muscles.  No definite evidence of active contrast extravasation.  Intermittent or low grade leakage is not excluded.  Original Report Authenticated By: WILLIAM R. STEVENS, M.D.    MEDICATIONS: Scheduled Meds:    . antiseptic oral rinse  15 mL Mouth Rinse q12n4p  . atorvastatin  10 mg Oral q1800  . chlorhexidine  15 mL Mouth Rinse BID  . digoxin  0.25 mg Oral Daily  . diltiazem  360 mg Oral Daily  . fluocinonide ointment  1 application Topical BID  . hydrocortisone cream   Topical BID  . metoprolol tartrate  25 mg  Oral BID  . phytonadione  5 mg Oral Once  . polyethylene glycol-electrolytes  4,000 mL Oral Once  . sodium chloride  3 mL Intravenous Q12H  . triamcinolone ointment  1 application Topical Daily   Continuous Infusions:    . sodium chloride 1,000 mL (05/12/11 0907)   PRN Meds:.acetaminophen, acetaminophen, morphine, ondansetron (ZOFRAN) IV, ondansetron, oxyCODONE, oxyCODONE-acetaminophen, sodium chloride, zolpidem  Antibiotics: Anti-infectives    None      Assessment/Plan: Patient Active Hospital Problem List: Right leg numbness /pain -is likely secondary to the infiltration/hematoma seen around the right iliac   artery aneurysm -has no overt weakness, cant move right leg-but this is due to pain!-he claims to me that the pain continues to get better each day, and he is able to move his leg much more today -however-fell today with PT-as both knees buckled-monitor for now. Given persistent pain will repeat a CT of the Right thigh area. -left lower ext is normal and pain free   Supratherapeutic INR  -patient claims no recent dose changes, denies any antibiotic use as well -INR still 1.93 today-for Colonoscopy today  Anemia -from acute blood loss -this is likely GI bleed-from supra-therapeutic INR -will need GI work up prior to recommencing anticoagulation - This is his second episode of bleeding, had a significant hematoma in left lower ext 2008 -Hb now stable after 3 units of PRBC transfusion  GI Bleed -Likely upper given h/o melena -EGD 4/26-results noted, Colonoscopy now scheduled for today  PAF -in and out of Aflutter since last evening -resume cardizem -continue with Digoxin and lopressor -coumadin on hold-may need to think about transitioning to perhaps Xarelto, given INR more than 10 on admit  appreciate [facility] input  Bilateral Iliac Artery Aneurysms -per [facility]-Angiogram on 05/14/11  HTN (hypertension)  -currently stable off meds  Hypokalemia -will replete  H/O  DVT/PE -on chronic coumadin-now off due to Bleeding -talked with Dr Chen about IVC filter placement-will be placed on 05/14/11 as well  Morbid Obesity: -counseled extensively  Disposition: Remain inpatient  DVT Prophylaxis: SCD's  Code Status Full Code  Kathalene Sporer M Duel Conrad, MD. 05/12/2011, 12:49 PM    

## 2011-05-12 NOTE — Progress Notes (Signed)
[name] with a past medical history significant for massive pulmonary embolism in 2008. At that time he also had PAF. He did require an I&D of his Lt leg a few weeks after discharge, (infected IV/central line access site). We saw him in Jan 2012 for atrial fib/flutter as referral from Dr Dorothyann Peng. 2D echo showed NL LVF, NL RVF and only mild TR. Myoview was low risk. He was set up for DCCV April 2012 but was in NSR and this was cancelled. He is admitted now with complaints of RLE numbness. CT shows bilat iliac aneurysms with Rt iliopsoas hematoma in the setting of an INR of 10. He is noted to be in PAF atrial flutter/artial fib with VR in the 100s.  He has GI procedures postponed due to persistently elevated INR and not yet clear with stools.  Subjective: No specific complaints.  Objective: Vital signs in last 24 hours: Temp:  [97.8 F (36.6 C)-100 F (37.8 C)] 98 F (36.7 C) (04/30 0443) Pulse Rate:  [62-80] 62  (04/30 0443) Resp:  [18-38] 18  (04/30 0443) BP: (130-168)/(65-96) 143/65 mmHg (04/30 0443) SpO2:  [97 %-99 %] 98 % (04/30 0443) Weight change:  Last BM Date: 06/09/2011 Intake/Output from previous day: +6914 06-09-22 0701 - 04/30 0700 In: 6150 [P.O.:4480; I.V.:1670] Out: 501 [Urine:500; Stool:1] Intake/Output this shift:    PE:  General: No chest pain Heart: RRR, s1/s2 Lungs: lungs clear Abd: morbidly obese Ext: no edema    Lab Results:  [facility] 05/12/11 0647 06/09/2011 0535  WBC 8.0 8.6  HGB 8.4* 8.4*  HCT 26.2* 26.3*  PLT 335 325   BMET  Basename 05/12/11 0647 2011-06-09 0535  NA 139 140  K 3.4* 3.6  CL 104 105  CO2 25 26  GLUCOSE 89 94  BUN 8 10  CREATININE 0.80 0.91  CALCIUM 8.2* 8.3*   No results found for this basename: TROPONINI:2,CK,MB:2 in the last 72 hours  Lab Results  Component Value Date   CHOL  Value: 194        ATP III CLASSIFICATION:  <200     mg/dL   Desirable  454-098  mg/dL   Borderline High  >=119    mg/dL   High  01/16/7827   HDL 28* 08/13/2006   LDLCALC  Value: 146        Total Cholesterol/HDL:CHD Risk Coronary Heart Disease Risk Table                     Men   Women  1/2 Average Risk   3.4   3.3* 08/13/2006   TRIG 102 08/13/2006   CHOLHDL 6.9 08/13/2006   No results found for this basename: HGBA1C       Hepatic Function Panel No results found for this basename: PROT,ALBUMIN,AST,ALT,ALKPHOS,BILITOT,BILIDIR,IBILI in the last 72 hours No results found for this basename: CHOL in the last 72 hours No results found for this basename: PROTIME in the last 72 hours    EKG: Orders placed during the hospital encounter of 05/07/11  . ED EKG  . EKG 12-LEAD  . EKG 12-LEAD  . EKG 12-LEAD  . EKG 12-LEAD  . EKG    Studies/Results: Dg Abd 2 Views  Jun 09, 2011  *RADIOLOGY REPORT*  Clinical Data: Abdominal pain, distension  ABDOMEN - 2 VIEW  Comparison: CT abdomen pelvis dated 05/08/2011  Findings: Colonic distension, progressed from recent CT, suggesting adynamic colonic ileus.  No evidence of small bowel obstruction.  No evidence of free air on the lateral decubitus view.  Degenerative changes of the visualized thoracolumbar spine.  IMPRESSION: Colonic distension, increased, suggesting adynamic colonic ileus.  No evidence of small bowel obstruction or free air.  Original Report Authenticated By: Charline Bills, M.D.    Medications: I have reviewed the patient's current medications.    [name] antiseptic oral rinse  15 mL Mouth Rinse q12n4p  . atorvastatin  10 mg Oral q1800  . chlorhexidine  15 mL Mouth Rinse BID  . digoxin  0.25 mg Oral Daily  . diltiazem  360 mg Oral Daily  . fluocinonide ointment  1 application Topical BID  . hydrocortisone cream   Topical BID  . metoprolol tartrate  25 mg Oral BID  . phytonadione  5 mg Oral Once  . polyethylene glycol-electrolytes  4,000 mL Oral Once  . sodium chloride  3 mL Intravenous Q12H  . triamcinolone ointment  1 application Topical Daily   Assessment/Plan: Patient  Active Problem List  Diagnoses  . Supratherapeutic INR, INR 10 on admission  . HTN (hypertension)  . Melena  . Anemia, Hgb 7.1 on admission  . Hypokalemia  . History of DVT (deep vein thrombosis)  . Right leg numbness, Rt iliopsois hematoma secondary to high INR on adm  . PAF, was to have DCCV 4/12 but was in NSR, now in and out of flutter  . Pulmonary embolism, "Massive" in 2008  . Chronic anticoagulation, PE DVT PAF  . Morbid obesity  . Dyslipidemia  . Psoriasis  . Sleep apnea, suspected  . PVD, bilat iliac aneurysms   PLAN: Continues with anemia, INR today is 1.93. Hypertension.  Once colonoscopy is complete, Pt to receive IVC filter.  Once these are complete- plan will be to use Xarelto instead of coumadin.  See Dr. Erin Hearing discussion in note of 05/11/11.  Currently in SR which he has maintained since he converted on the 28th of April.   LOS: 5 days   INGOLD,LAURA R 05/12/2011, 10:15 AM  Pt. Seen and examined. Agree with the NP/PA-C note as written.  Plan IVC filter tomorrow. Agree with trial of xarelto - indications for PE/DVT/A-fib, may be easier to regulate without risk of supratherapeutic INR. Await colonoscopy. He reports "green" stools, but they are clearing up.  Chrystie Nose, MD, [facility] Attending Cardiologist [facility]

## 2011-05-12 NOTE — Progress Notes (Addendum)
Clinical Social Work Department BRIEF PSYCHOSOCIAL ASSESSMENT 05/13/2011  Patient:  [name], [name]     Account Number:  [id number]     Admit date:  05/07/2011  Clinical Social Worker:  Doree Albee  Date/Time:  05/12/2011 12:00 M  Referred by:  RN  Date Referred:  05/12/2011 Referred for  SNF Placement   Other Referral:   Interview type:  Patient Other interview type:    PSYCHOSOCIAL DATA Living Status:  ALONE Admitted from facility:   Level of care:   Primary support name:  no one identified Primary support relationship to patient:  NONE Degree of support available:   n/a    CURRENT CONCERNS Current Concerns  Post-Acute Placement   Other Concerns:    SOCIAL WORK ASSESSMENT / PLAN CSW met with patient at bedside to discuss patient dc plans. Pt shared that he was interested in snf for short term rehab before returning home. CSW and Pt discuss snf options and process. Pt requested snf as close to hosptial as possible. CSW and pt discussed [place] and [address] living [place]. Pt agreed, but did not wish to expand snf search beyond those options.    Pt could not identify anyone as his primary support person. pt stated that he wanted to keep this hospitalization private.   Assessment/plan status:  Psychosocial Support/Ongoing Assessment of Needs Other assessment/ plan:   and discharge planning   Information/referral to community resources:   snf list    PATIENT'S/FAMILY'S RESPONSE TO PLAN OF CARE: Pt appreciated csw concern and support. Pt is motivated to dc when pt is medically stable for rehab. CSW will complete pt fl2 for md signature and initate snf search of [place] and [facility] [place].         Catha Gosselin, Theresia Majors  [phone] .05/12/2011 1033am

## 2011-05-12 NOTE — Plan of Care (Signed)
Problem: Phase II Progression Outcomes Goal: Discharge plan established Outcome: Completed/Met Date Met:  05/12/11 Need SNF rehab

## 2011-05-12 NOTE — Op Note (Signed)
[facility] [address] [place], [place]  [address]  OPERATIVE PROCEDURE REPORT  PATIENT:  Jadin, Creque  MR#:  [id number] BIRTHDATE:  1950-11-27  GENDER:  male ENDOSCOPIST:  Jeani Hawking, MD ASSISTANT:  Beryle Beams, Technician and Marla Roe, Technician PROCEDURE DATE:  05/12/2011 PROCEDURE:  Colonoscopy [phone] ASA CLASS:  Class III INDICATIONS:  Anemia and heme positive stool MEDICATIONS:  Fentanyl 75 mcg IV, Versed 5 mg IV  DESCRIPTION OF PROCEDURE:   After the risks benefits and alternatives of the procedure were thoroughly explained, informed consent was obtained.  Digital rectal exam was performed and revealed no abnormalities.   The EC-[id number] ([id number]) endoscope was introduced through the anus and advanced to the cecum, which was identified by both the appendix and ileocecal valve, without limitations.  The quality of the prep was good..  The instrument was then slowly withdrawn as the colon was fully examined. <<PROCEDUREIMAGES>>  FINDINGS:  Extensive washing was required and good views of the mucosal were obtained. In the descending colon a 2 mm and 5 mm polyp was identified. They were not removed as his INR was elevated at 1.9 and he will require anticoagulation after surgical intervention. Also with his comorbidities the risks of removing the polyps would ultimately outweight the long term benefits. Scattered sigmoid diverticula were found. No evidence of any mass, inflammation, ulcerations, erosions, or vascular abnormalities. Retroflexed views in the rectum revealed internal and external hemorrhoids.    The scope was then withdrawn from the patient and the procedure terminated.  COMPLICATIONS:  None  IMPRESSION:  1) Sessile polyps 2) Internal and external hemorrhoids 3) Diverticula RECOMMENDATIONS:  1) No further GI evaluation at this time. Signing off. 2) Discussion can be made in the future about the utility of a repeat  colonoscopy with polypectomy once his vascular issues are resolved.  ______________________________ Jeani Hawking, MD  n. Rosalie DoctorJeani Hawking at 05/12/2011 04:22 PM  Eliott Nine, [phone]

## 2011-05-13 ENCOUNTER — Encounter (HOSPITAL_COMMUNITY): Payer: Self-pay | Admitting: Gastroenterology

## 2011-05-13 DIAGNOSIS — R791 Abnormal coagulation profile: Secondary | ICD-10-CM

## 2011-05-13 DIAGNOSIS — D649 Anemia, unspecified: Secondary | ICD-10-CM

## 2011-05-13 DIAGNOSIS — K921 Melena: Secondary | ICD-10-CM

## 2011-05-13 DIAGNOSIS — R209 Unspecified disturbances of skin sensation: Secondary | ICD-10-CM

## 2011-05-13 LAB — CBC
HCT: 27.4 % — ABNORMAL LOW (ref 39.0–52.0)
MCHC: 31.8 g/dL (ref 30.0–36.0)
Platelets: 324 10*3/uL (ref 150–400)
RDW: 18.6 % — ABNORMAL HIGH (ref 11.5–15.5)
WBC: 6.5 10*3/uL (ref 4.0–10.5)

## 2011-05-13 LAB — BASIC METABOLIC PANEL
BUN: 10 mg/dL (ref 6–23)
GFR calc Af Amer: 89 mL/min — ABNORMAL LOW (ref >90)
GFR calc non Af Amer: 76 mL/min — ABNORMAL LOW (ref >90)
Potassium: 3.1 mEq/L — ABNORMAL LOW (ref 3.5–5.1)
Sodium: 139 mEq/L (ref 135–145)

## 2011-05-13 MED ORDER — VITAMIN K1 10 MG/ML IJ SOLN
1.0000 mg | Freq: Once | INTRAMUSCULAR | Status: AC
  Administered 2011-05-13: 1 mg via INTRAVENOUS
  Filled 2011-05-13: qty 0.1

## 2011-05-13 MED ORDER — POTASSIUM CHLORIDE CRYS ER 20 MEQ PO TBCR
40.0000 meq | EXTENDED_RELEASE_TABLET | Freq: Once | ORAL | Status: AC
  Administered 2011-05-13: 40 meq via ORAL
  Filled 2011-05-13: qty 2

## 2011-05-13 NOTE — Progress Notes (Addendum)
[facility]  Daily Progress Note  Assessment/Planning: B CIA aneurysm, prev PE, Coumadin bleeding complications   Vit K 1 mg IV today, check INR at 6 PM  Aortogram, Bilateral pelvic angiogram, IVC filter placement tomorrow  Due to risk of post-puncture bleeding, INR needs to be < 1.4 before I would proceed I discussed with the patient the nature of angiographic procedures, especially the limited patencies of any endovascular intervention.  The patient is aware of that the risks of an angiographic procedure include but are not limited to: bleeding, infection, access site complications, renal failure, embolization, rupture of vessel, dissection, possible need for emergent surgical intervention, possible need for surgical procedures to treat the patient's pathology, and stroke and death.   I discussed with the patient risk, benefits, and alternatives to inferior vena caval filter placement.  The risks include but are not limited to: bleeding, infection, possible injury of the inferior vena cava, injury of structures adjacent to the filter including bowel, migration, fracture, continued pulmonary embolization, and caval thrombosis.  The patient acknowledges the risks and agrees to proceed. The patient is aware of the risks and agrees to proceed.  Subjective  - 1 Day Post-Op  No complaints  Objective Filed Vitals:   05/12/11 1630 05/12/11 1640 05/12/11 2116 05/13/11 0549  BP: 100/53 120/60 135/77 148/82  Pulse:   77 78  Temp:   98.2 F (36.8 C) 98.2 F (36.8 C)  TempSrc:   Oral Oral  Resp: 16 16 20 20   Height:      Weight:      SpO2: 98% 98% 97% 97%    Intake/Output Summary (Last 24 hours) at 05/13/11 0850 Last data filed at 05/13/11 0600  Gross per 24 hour  Intake   2605 ml  Output    603 ml  Net   2002 ml    PULM  CTAB CV  RRR GI  soft, NTND, no lower quadrant TTP, obese VASC  B feet warm, no R leg pain  Laboratory CBC    Component  Value Date/Time   WBC 6.5 05/13/2011 0520   HGB 8.7* 05/13/2011 0520   HCT 27.4* 05/13/2011 0520   PLT 324 05/13/2011 0520    BMET    Component Value Date/Time   NA 139 05/13/2011 0520   K 3.1* 05/13/2011 0520   CL 105 05/13/2011 0520   CO2 23 05/13/2011 0520   GLUCOSE 81 05/13/2011 0520   BUN 10 05/13/2011 0520   CREATININE 1.03 05/13/2011 0520   CALCIUM 8.3* 05/13/2011 0520   GFRNONAA 76* 05/13/2011 0520   GFRAA 89* 05/13/2011 0520    Leonides Sake, MD [facility] [place] Office: [phone] Pager: [phone]  05/13/2011, 8:50 AM

## 2011-05-13 NOTE — Progress Notes (Addendum)
Clinical Social Work Department CLINICAL SOCIAL WORK PLACEMENT NOTE 05/13/2011  Patient:  [name], [name]  Account Number:  [id number] Admit date:  05/07/2011  Clinical Social Worker:  Doree Albee  Date/time:  05/12/2011 11:00 AM  Clinical Social Work is seeking post-discharge placement for this patient at the following level of care:   SKILLED NURSING   (*CSW will update this form in Epic as items are completed)   05/12/2011  Patient/family provided with [facility] Department of Clinical Social Work's list of facilities offering this level of care within the geographic area requested by the patient (or if unable, by the patient's family).  05/11/2021  Patient/family informed of their freedom to choose among providers that offer the needed level of care, that participate in Medicare, Medicaid or managed care program needed by the patient, have an available bed and are willing to accept the patient.  05/12/2011  Patient/family informed of [facility]' ownership interest in [facility], as well as of the fact that they are under no obligation to receive care at this facility.  PASARR submitted to EDS on 05/12/2011 PASARR number received from EDS on 05/13/2011  FL2 transmitted to all facilities in geographic area requested by pt/family on  05/12/2011 FL2 transmitted to all facilities within larger geographic area on   Patient informed that his/her managed care company has contracts with or will negotiate with  certain facilities, including the following:     Patient/family informed of bed offers received:  05/13/2011  Patient chooses bed at [facility] Physician recommends and patient chooses bed at    [facility] [place]  [facility]  [facility]  Patient to be transferred to facility by [facility]   The following physician request were entered in Epic:   Additional Comments:  .Catha Gosselin, Theresia Majors  [phone] .05/13/2011 1100am

## 2011-05-13 NOTE — Progress Notes (Signed)
PT Cancellation Note  Treatment cancelled today due to pt states legs are sore and he doesn't feel he can get up.[name]  [name] 05/13/2011, 11:46 AM  Skip Mayer PT [phone]

## 2011-05-13 NOTE — Progress Notes (Signed)
PATIENT DETAILS Name: [name] Age: 61 y.o. Sex: male Date of Birth: [birth year] Admit Date: 05/07/2011 PCP:No primary provider on file.  Interim History Patient is a 61 yo male-h/o large PE, PAF-on chronic coumadin therapy presented with sudden onset of Right Thigh pain/numbness. He was found to have a  INR >10 and  Hb of 7 on admit. Further CT Scans did not show any retroperitoneal bleed, but showed Infiltration of fat about the iliopsoas muscle in the proximal thigh with some edematous change within the muscle belly.A CTA of the abdomen demonstrated Bilateral iliac artery aneurysms  with infiltration or hematoma in the fat around the right iliac aneurysm and in the iliopsoas and piriformis muscles. There was no definite evidence of active contrast extravasation. He also gave a h/o of melena as well. He was subsequently admitted, given FFP, Vit K and PRBC transfusion. His Hemoglobin is now stable, he has no further melena, a EGD did not show significant abnormalities. A colonoscopy is currently pending. [facility] has scheduled the patient for a IVC filter placement and Peripheral angiograms on 5/2. His right thigh pain is better, but still has not resolved  Subjective: Denies pain.  Objective: Vital signs in last 24 hours: Filed Vitals:   05/13/11 0549 05/13/11 1004 05/13/11 1030 05/13/11 1032  BP: 148/82 145/80 119/71 119/71  Pulse: 78 72    Temp: 98.2 F (36.8 C) 98.2 F (36.8 C)    TempSrc: Oral Oral    Resp: 20 19    Height:      Weight:      SpO2: 97% 98%      Weight change:   Body mass index is 44.04 kg/(m^2).  Intake/Output from previous day:  Intake/Output Summary (Last 24 hours) at 05/13/11 1051 Last data filed at 05/13/11 0600  Gross per 24 hour  Intake   2605 ml  Output    603 ml  Net   2002 ml    PHYSICAL EXAM: Gen Exam: Awake and alert with clear speech.   Neck: Supple, No JVD.   Chest: B/L Clear. No rhonci or rales heared CVS: S1 S2 Regular, no  murmurs. Abdomen: soft, BS +, non tender, non distended.  Extremities: no edema, lower extremities warm to touch.Right thigh-not distended/swollen Neurologic: Non Focal.   Skin: No Rash.   Wounds: N/A.    CONSULTS:  GI and vascular surgery  LAB RESULTS: CBC  Lab 05/13/11 0520 05/12/11 0647 05/11/11 0535 05/10/11 0540 05/09/11 0703  WBC 6.5 8.0 8.6 9.0 9.6  HGB 8.7* 8.4* 8.4* 8.5* 8.4*  HCT 27.4* 26.2* 26.3* 26.3* 25.6*  PLT 324 335 325 328 310  MCV 90.1 87.9 87.7 86.5 87.7  MCH 28.6 28.2 28.0 28.0 28.8  MCHC 31.8 32.1 31.9 32.3 32.8  RDW 18.6* 18.8* 19.2* 19.6* 20.5*  LYMPHSABS -- -- -- -- --  MONOABS -- -- -- -- --  EOSABS -- -- -- -- --  BASOSABS -- -- -- -- --  BANDABS -- -- -- -- --    Chemistries   Lab 05/13/11 0520 05/12/11 9147 05/11/11 0535 05/10/11 0540 05/09/11 0703 05/07/11 0823  NA 139 139 140 140 139 --  K 3.1* 3.4* 3.6 3.1* 3.7 --  CL 105 104 105 104 102 --  CO2 23 25 26 25 25  --  GLUCOSE 81 89 94 95 91 --  BUN 10 8 10 9 10  --  CREATININE 1.03 0.80 0.91 0.76 0.86 --  CALCIUM 8.3* 8.2* 8.3* 8.3* 8.4 --  MG -- -- -- -- -- 2.0    GFR Estimated Creatinine Clearance: 118.9 ml/min (by C-G formula based on Cr of 1.03).  Coagulation profile  Lab 05/12/11 0647 05/11/11 0535 05/10/11 0540 05/09/11 0703 05/08/11 0929  INR 1.93* 2.19* 3.02* 2.51* 2.15*  PROTIME -- -- -- -- --   Urine Studies No results found for this basename: UACOL:2,UAPR:2,USPG:2,UPH:2,UTP:2,UGL:2,UKET:2,UBIL:2,UHGB:2,UNIT:2,UROB:2,ULEU:2,UEPI:2,UWBC:2,URBC:2,UBAC:2,CAST:2,CRYS:2,UCOM:2,BILUA:2 in the last 72 hours  MICROBIOLOGY: No results found for this or any previous visit (from the past 240 hour(s)).  RADIOLOGY STUDIES/RESULTS: Ct Abdomen Pelvis Wo Contrast  05/08/2011  *RADIOLOGY REPORT*  Clinical Data: Leg numbness.  Anemia.  Rule out retroperitoneal bleed.  CT ABDOMEN AND PELVIS WITHOUT CONTRAST  Technique:  Multidetector CT imaging of the abdomen and pelvis was performed  following the standard protocol without intravenous contrast.  Comparison: CT pelvis 05/07/2011.  CT abdomen and pelvis 08/20/2006  Findings: Slight fibrosis in the lung bases.  Small esophageal hiatal hernia.  Circumscribed cyst in the anterior right lobe of the liver is stable since the prior study.  Unenhanced appearance of the liver, spleen, gallbladder, pancreas, and adrenal glands is otherwise unremarkable.  There is increased density in the renal collecting systems, ureters, and bladder bilaterally suggesting previous contrast administration.  Mild calcification of the abdominal aorta.  Bilateral iliac artery aneurysms, measuring 3.2 cm on the right and 3.6 cm on the left.  There is infiltration around the right iliac artery aneurysm and along the iliopsoas muscle suggesting edema, inflammation, or hematoma, possibly due to rupture.  Asymmetric enlargement of the right piriformis muscle with a vague low attenuation change.  This might represent intramuscular fluid collection or hematoma. Moderate sized umbilical hernia containing fat.  No free air or free fluid in the abdomen.  Stomach, small bowel, and colon are not distended.  Pelvis:  Prostate gland is not enlarged.  Bladder wall is not thickened.  No significant pelvic lymphadenopathy.  Degenerative changes in the lumbar spine with normal alignment.  IMPRESSION: Infiltration around the right iliopsoas muscle with asymmetric enlargement of the iliopsoas and piriformis muscles.  Suggestion of low attenuation change in the right piriformis muscle.  Changes may represent inflammatory edema or hematoma.  There is a right iliac artery aneurysm and rupture is not excluded.  There is also a left iliac artery aneurysm.  Results discussed by telephone at the time of dictation, 0209 hours on 05/08/2011 with  the patient's nurse, Lamar Laundry,  who aknowledged these results and will contact the referring physician.  Original Report Authenticated By: Marlon Pel, M.D.    Dg Chest 1 View  04/16/2011  *RADIOLOGY REPORT*  Clinical Data: Pharmquest study.  CHEST - 1 VIEW  Comparison: 05/01/2010  Findings: Heart and mediastinal contours are within normal limits. No focal opacities or effusions.  No acute bony abnormality.  IMPRESSION: No active cardiopulmonary disease.  Original Report Authenticated By: Cyndie Chime, M.D.   Dg Chest 2 View  05/07/2011  *RADIOLOGY REPORT*  Clinical Data: Right thigh pain since 10:00 a.m.  No known injury. Shortness of breath.  Numbness.  CHEST - 2 VIEW  Comparison: 04/16/2011  Findings: Shallow inspiration.  Mild cardiac enlargement.  Normal pulmonary vascularity.  No focal airspace consolidation or edema. No blunting of the costophrenic angles.  No pneumothorax. Degenerative changes in the thoracic spine.  No significant change since previous study.  IMPRESSION: No evidence of active pulmonary disease.  Original Report Authenticated By: Marlon Pel, M.D.   Ct Head Wo Contrast  05/07/2011  *RADIOLOGY  REPORT*  Clinical Data: Right leg numbness. Light bothers eyes.  CT HEAD WITHOUT CONTRAST  Technique:  Contiguous axial images were obtained from the base of the skull through the vertex without contrast.  Comparison: None.  Findings: The ventricles and sulci appear symmetrical.  No mass effect or midline shift.  No abnormal extra-axial fluid collections.  Vague focal area of low attenuation in the left parietal white matter posterior to the sylvian fissure.  This may represent partial voluming with the posterior horn of the left lateral ventricle but a focal area of early ischemia can also have this appearance.  No evidence of acute intracranial hemorrhage. Gray-white matter junctions are distinct.  Basal cisterns are not effaced.  No depressed skull fractures.  Vascular calcifications. Visualized paranasal sinuses and mastoid air cells are not opacified.  Old nasal bone fracture.  IMPRESSION: Nonspecific low attenuation area in the left  posterior parietal white matter which could represent early ischemic focus versus partial voluming.  No significant mass effect.  No acute intracranial hemorrhage.  Original Report Authenticated By: Marlon Pel, M.D.   Ct Femur Right W Contrast  05/07/2011  *RADIOLOGY REPORT*  Clinical Data: Right thigh pain.  History of abscess.  CT OF THE RIGHT LOWER EXTREMITY WITH CONTRAST  Contrast: OMNIPAQUE IOHEXOL 300 MG/ML  SOLN  Comparison: CT abdomen and pelvis 08/20/2006.  Findings: No abscess is identified.  There is infiltration of fat about the iliopsoas muscle belly and the muscle appears mildly edematous.  Ossification of the iliopsoas tendon at the lesser trochanter is compatible with old injury/enthesopathic change. Visualized muscles and tendons otherwise are normal in appearance. Subcutaneous fatty tissues are normal in appearance.  No mass is identified.  No bony destructive change or fracture is seen.  Marked appearing degenerative disease about the right knee is noted with a small joint effusion is seen. There is a loose body in the posterior aspect of the joint measuring 1.5 cm in diameter.  Imaged intrapelvic contents demonstrate a right external iliac node measuring 1.5 cm short axis dimension on image 8.  IMPRESSION:  1.  Negative for abscess. 2.  Infiltration of fat about the iliopsoas muscle in the proximal thigh with some edematous change within the muscle belly could be due to infectious or inflammatory change.  Muscle strain could create a similar appearance.  Enthesopathic change of the iliopsoas tendon is again noted. 3.  Small right external iliac lymph node is likely reactive. 4.  Advanced degenerative disease about the right knee.  Original Report Authenticated By: Bernadene Bell. D'ALESSIO, M.D.   Dg Shoulder Left  05/01/2011  *RADIOLOGY REPORT*  Clinical Data: Pain post crushing injury  LEFT SHOULDER - 2+ VIEW  Comparison: None.  Findings: Three views of the left shoulder  submitted.  No acute fracture or subluxation.  No radiopaque foreign body.  IMPRESSION: No acute fracture or subluxation.  Original Report Authenticated By: Natasha Mead, M.D.   Dg Humerus Left  05/01/2011  *RADIOLOGY REPORT*  Clinical Data: Injured left arm and shoulder approximately 1 week ago while pulling in self out of a car.  Patient unable to adduct the left arm above chest level.  LEFT HUMERUS - 2+ VIEW 05/01/2011:  Comparison: Left shoulder x-rays obtained concurrently.  Findings: No evidence of acute, subacute, or healed fractures.  No intrinsic osseous abnormalities involving the humerus.  Well- preserved bone mineral density.  Visualized elbow joint intact.  IMPRESSION: Normal examination.  Original Report Authenticated By: Arnell Sieving, M.D.  Ct Angio Abd/pel W/ And/or W/o  05/08/2011  *RADIOLOGY REPORT*  Clinical Data: Left leg weakness and pain.  Known iliac aneurysms with infiltration or hematoma in the right iliopsoas region on prior unenhanced CT.  Decreasing hemoglobin with multiple transfusions.  CT ANGIOGRAPHY ABDOMEN AND PELVIS WITH CONTRAST AND WITHOUT CONTRAST  Comparison: Unenhanced CT 05/08/2011  Findings: The study is technically limited due to the patient's body habitus.  There is overlying artifact centered at the area of interest.  This limits visualization of this area.  However, there appears to be flow throughout the right and left iliac artery aneurysms without evidence of thrombosis or occlusion.  There is no obvious contrast extravasation although a small focus of active hemorrhage could be obscured.  Again demonstrated is a infiltration or hematoma around the right iliac artery aneurysm and in the iliopsoas recent extending down to the growing and in the piriformis muscles as before.  No significant increase.  The lung bases are clear.  Cyst again demonstrated in the liver. Cyst in the right kidney.  No hydronephrosis.  Normal homogeneous parenchymal nephrograms.  The  gallbladder, pancreas, spleen, adrenal glands, the stomach, small bowel, colon, and retroperitoneal lymph nodes are unremarkable.  Small esophageal hiatal hernia.  Calcification of the aorta.  Broad-based umbilical hernia containing fat.  Pelvis:  Bladder wall is not thickened.  No significant pelvic lymphadenopathy.  No evidence of loculated fluid collection.  The appendix is normal.  IMPRESSION: Technically limited study due to the patient's body habitus. Bilateral iliac artery aneurysms are again demonstrated with infiltration or hematoma in the fat around the right iliac aneurysm and in the iliopsoas and piriformis muscles.  No definite evidence of active contrast extravasation.  Intermittent or low grade leakage is not excluded.  Original Report Authenticated By: Marlon Pel, M.D.    MEDICATIONS: Scheduled Meds:    . antiseptic oral rinse  15 mL Mouth Rinse q12n4p  . atorvastatin  10 mg Oral q1800  . chlorhexidine  15 mL Mouth Rinse BID  . digoxin  0.25 mg Oral Daily  . diltiazem  360 mg Oral Daily  . enoxaparin (LOVENOX) injection  80 mg Subcutaneous Q24H  . fluocinonide ointment  1 application Topical BID  . hydrocortisone cream   Topical BID  . metoprolol tartrate  25 mg Oral BID  . phytonadione (VITAMIN K) IV  1 mg Intravenous Once  . potassium chloride  40 mEq Oral Once  . potassium chloride  40 mEq Oral Once  . sodium chloride  3 mL Intravenous Q12H  . triamcinolone ointment  1 application Topical Daily  . DISCONTD: enoxaparin (LOVENOX) injection  40 mg Subcutaneous Q24H   Continuous Infusions:    . sodium chloride 40 mL/hr at 05/12/11 1945   PRN Meds:.acetaminophen, acetaminophen, morphine, ondansetron (ZOFRAN) IV, ondansetron, oxyCODONE, oxyCODONE-acetaminophen, sodium chloride, zolpidem, DISCONTD: fentaNYL, DISCONTD: midazolam  Antibiotics: Anti-infectives    None      Assessment/Plan: Patient Active Hospital Problem List:  Right leg numbness /pain -Is  likely secondary to the infiltration/hematoma seen around the right iliac artery aneurysm -Has no overt weakness, CT scan did not exclude contrast extravasation. -Patient will undergo aortogram in the morning with IVC filter placement   Supratherapeutic INR  -patient claims no recent dose changes, denies any antibiotic use as well -INR is pending today.  Anemia -Likely from acute blood loss, iliopsoas bleeding versus GI. -Colonoscopy done, showed no source of active bleeding. -This is his second episode of bleeding, had a significant  hematoma in left lower ext 2008 -Hb now stable after 3 units of PRBC transfusion  GI Bleed -Anemia with Hemoccult positive stool. -Colonoscopy done, no evidence of bleeding. Sessile polyps internal and external hemorrhoids and diverticula. -This is likely secondary to his supratherapeutic INR. Hemoglobin stable, GI signed off.  PAF -Atrial fibrillation/flutter. Appreciate [facility] cardiology help. -Recommendation for Xarleto after the IVC placement as noted. -Patient is on Cardizem, digoxin and beta blockers.  Bilateral Iliac Artery Aneurysms -per [facility]-Angiogram on 05/14/11  HTN (hypertension)  -currently stable off meds  Hypokalemia -will replete  H/O DVT/PE -on chronic coumadin-now off due to Bleeding -talked with Dr Imogene Burn about IVC filter placement-will be placed on 05/14/11 as well  Morbid Obesity: -counseled extensively  Disposition: Remain inpatient  DVT Prophylaxis: SCD's  Code Status Full Code  Clint Lipps Pager: [phone] 05/13/2011, 10:52 AM

## 2011-05-13 NOTE — Progress Notes (Signed)
Subjective:  No complaints, tolerating diet  Objective:  Vital Signs in the last 24 hours: Temp:  [98.2 F (36.8 C)-98.9 F (37.2 C)] 98.2 F (36.8 C) (05/01 0549) Pulse Rate:  [60-78] 78  (05/01 0549) Resp:  [16-36] 20  (05/01 0549) BP: (93-164)/(53-83) 148/82 mmHg (05/01 0549) SpO2:  [96 %-100 %] 97 % (05/01 0549)  Intake/Output from previous day:  Intake/Output Summary (Last 24 hours) at 05/13/11 0803 Last data filed at 05/13/11 0600  Gross per 24 hour  Intake   2605 ml  Output      0 ml  Net   2605 ml    Physical Exam: General appearance: alert, cooperative and morbidly obese Thick neck Lungs: clear to auscultation bilaterally Heart: regularly irregular rhythm Abd;  Soft BS + MIld edema    Rate: 70  Rhythm: normal sinus rhythm and atrial bigemeny, last A flutter 4/28  Lab Results:  Basename 05/13/11 0520 05/12/11 0647  WBC 6.5 8.0  HGB 8.7* 8.4*  PLT 324 335    Basename 05/13/11 0520 05/12/11 0647  NA 139 139  K 3.1* 3.4*  CL 105 104  CO2 23 25  GLUCOSE 81 89  BUN 10 8  CREATININE 1.03 0.80   No results found for this basename: TROPONINI:2,CK,MB:2 in the last 72 hours Hepatic Function Panel No results found for this basename: PROT,ALBUMIN,AST,ALT,ALKPHOS,BILITOT,BILIDIR,IBILI in the last 72 hours No results found for this basename: CHOL in the last 72 hours  Basename 05/12/11 0647  INR 1.93*    Imaging: Imaging results have been reviewed  Cardiac Studies:  Assessment/Plan:   Principal Problem:  *Right leg numbness, Rt iliopsois hematoma secondary to high INR on adm Active Problems:  Supratherapeutic INR, INR 10 on admission  HTN (hypertension)  Melena  Anemia, Hgb 7.1 on admission  PAF, was to have DCCV 4/12 but was in NSR, now in and out of flutter  Chronic anticoagulation, PE DVT PAF  Dyslipidemia  PVD, bilat iliac aneurysms  Hypokalemia  History of DVT (deep vein thrombosis)  Pulmonary embolism, "Massive" in 2008  Morbid  obesity  Psoriasis  Sleep apnea, suspected  Plan- keep K+ close to 4.0. IVC Thurssday, then Xarelto. He is on Dig, Cardizem, and Lopressor- consider stopping Lanoxin, will discuss with MD. He needs sleep study after discharge. Document TSH.    Corine Shelter PA-C 05/13/2011, 8:03 AM   Patient seen and examined. Agree with assessment and plan.  INR yesterday 1.93, todays level pending.  For IVC filter tomorrow if INR reversed. To have Vit K today per Dr. Imogene Burn.  Diagnostic polysomnogram as outpt for high likelihood for OSA.   Lennette Bihari, MD, [facility] 05/13/2011 9:40 AM

## 2011-05-14 ENCOUNTER — Encounter (HOSPITAL_COMMUNITY)
Admission: EM | Disposition: A | Discharge status: Skilled Nursing Facility | Payer: Self-pay | Source: Home / Self Care | Attending: Internal Medicine

## 2011-05-14 ENCOUNTER — Inpatient Hospital Stay (HOSPITAL_COMMUNITY): Payer: Medicare Other

## 2011-05-14 DIAGNOSIS — K921 Melena: Secondary | ICD-10-CM

## 2011-05-14 DIAGNOSIS — D649 Anemia, unspecified: Secondary | ICD-10-CM

## 2011-05-14 DIAGNOSIS — I2699 Other pulmonary embolism without acute cor pulmonale: Secondary | ICD-10-CM

## 2011-05-14 DIAGNOSIS — R209 Unspecified disturbances of skin sensation: Secondary | ICD-10-CM

## 2011-05-14 DIAGNOSIS — R791 Abnormal coagulation profile: Secondary | ICD-10-CM

## 2011-05-14 DIAGNOSIS — I723 Aneurysm of iliac artery: Secondary | ICD-10-CM

## 2011-05-14 HISTORY — PX: LOWER EXTREMITY ANGIOGRAM: SHX5508

## 2011-05-14 HISTORY — PX: INSERTION OF VENA CAVA FILTER: SHX5513

## 2011-05-14 HISTORY — PX: ABDOMINAL AORTAGRAM: SHX5454

## 2011-05-14 LAB — TSH: TSH: 2.159 u[IU]/mL (ref 0.350–4.500)

## 2011-05-14 LAB — BASIC METABOLIC PANEL
BUN: 8 mg/dL (ref 6–23)
CO2: 23 mEq/L (ref 19–32)
Calcium: 8.3 mg/dL — ABNORMAL LOW (ref 8.4–10.5)
Chloride: 105 mEq/L (ref 96–112)
Creatinine, Ser: 0.95 mg/dL (ref 0.50–1.35)
GFR calc Af Amer: >90 mL/min (ref >90)
GFR calc non Af Amer: 88 mL/min — ABNORMAL LOW (ref >90)
Glucose, Bld: 85 mg/dL (ref 70–99)
Potassium: 3.2 mEq/L — ABNORMAL LOW (ref 3.5–5.1)
Sodium: 137 mEq/L (ref 135–145)

## 2011-05-14 LAB — CBC
HCT: 25.3 % — ABNORMAL LOW (ref 39.0–52.0)
Hemoglobin: 8.2 g/dL — ABNORMAL LOW (ref 13.0–17.0)
MCH: 28.3 pg (ref 26.0–34.0)
MCHC: 32.4 g/dL (ref 30.0–36.0)
MCV: 87.2 fL (ref 78.0–100.0)
Platelets: 313 10*3/uL (ref 150–400)
RBC: 2.9 MIL/uL — ABNORMAL LOW (ref 4.22–5.81)
RDW: 18.1 % — ABNORMAL HIGH (ref 11.5–15.5)
WBC: 7.4 10*3/uL (ref 4.0–10.5)

## 2011-05-14 LAB — PROTIME-INR
INR: 1.33 (ref 0.00–1.49)
Prothrombin Time: 16.7 seconds — ABNORMAL HIGH (ref 11.6–15.2)

## 2011-05-14 SURGERY — ABDOMINAL AORTAGRAM
Anesthesia: LOCAL

## 2011-05-14 MED ORDER — FUROSEMIDE 10 MG/ML IJ SOLN
40.0000 mg | Freq: Once | INTRAMUSCULAR | Status: AC
  Administered 2011-05-14: 40 mg via INTRAVENOUS
  Filled 2011-05-14: qty 4

## 2011-05-14 MED ORDER — METOPROLOL TARTRATE 50 MG PO TABS
50.0000 mg | ORAL_TABLET | Freq: Two times a day (BID) | ORAL | Status: DC
  Administered 2011-05-14 – 2011-05-15 (×2): 50 mg via ORAL
  Filled 2011-05-14 (×4): qty 1

## 2011-05-14 MED ORDER — LIDOCAINE HCL (PF) 1 % IJ SOLN
INTRAMUSCULAR | Status: AC
  Filled 2011-05-14: qty 30

## 2011-05-14 MED ORDER — ACETAMINOPHEN 325 MG PO TABS: 650.0000 mg | ORAL_TABLET | ORAL | Status: DC | PRN

## 2011-05-14 MED ORDER — MIDAZOLAM HCL 2 MG/2ML IJ SOLN
INTRAMUSCULAR | Status: AC
  Filled 2011-05-14: qty 2

## 2011-05-14 MED ORDER — FENTANYL CITRATE 0.05 MG/ML IJ SOLN
INTRAMUSCULAR | Status: AC
  Filled 2011-05-14: qty 2

## 2011-05-14 MED ORDER — MAGNESIUM SULFATE 40 MG/ML IJ SOLN
2.0000 g | Freq: Once | INTRAMUSCULAR | Status: AC
  Administered 2011-05-14: 2 g via INTRAVENOUS
  Filled 2011-05-14: qty 50

## 2011-05-14 MED ORDER — MORPHINE SULFATE 2 MG/ML IJ SOLN: 2.0000 mg | INTRAMUSCULAR | Status: DC | PRN

## 2011-05-14 MED ORDER — HEPARIN (PORCINE) IN NACL 2-0.9 UNIT/ML-% IJ SOLN
INTRAMUSCULAR | Status: AC
  Filled 2011-05-14: qty 1000

## 2011-05-14 MED ORDER — SODIUM CHLORIDE 0.9 % IV SOLN
1.0000 mL/kg/h | INTRAVENOUS | Status: DC
  Administered 2011-05-14 – 2011-05-15 (×3): 1 mL/kg/h via INTRAVENOUS

## 2011-05-14 MED ORDER — POTASSIUM CHLORIDE CRYS ER 20 MEQ PO TBCR
60.0000 meq | EXTENDED_RELEASE_TABLET | Freq: Three times a day (TID) | ORAL | Status: AC
  Administered 2011-05-14 (×2): 60 meq via ORAL
  Filled 2011-05-14: qty 3
  Filled 2011-05-14: qty 1

## 2011-05-14 MED ORDER — OXYCODONE-ACETAMINOPHEN 5-325 MG PO TABS
1.0000 | ORAL_TABLET | ORAL | Status: DC | PRN
  Administered 2011-05-14: 2 via ORAL
  Filled 2011-05-14: qty 2

## 2011-05-14 MED ORDER — ONDANSETRON HCL 4 MG/2ML IJ SOLN: 4.0000 mg | Freq: Four times a day (QID) | INTRAMUSCULAR | Status: DC | PRN

## 2011-05-14 NOTE — Op Note (Addendum)
OPERATIVE NOTE   PROCEDURE: 1.  Right common femoral artery cannulation under ultrasound guidance 2.  Aortogram 3.  Bilateral pelvic angiogram 4.  Right common femoral vein cannulation 5.  Inferior vena cavogram 6.  Placement of IVC filter (Celect)  PRE-OPERATIVE DIAGNOSIS: Bilateral iliac artery aneurysms, pulmonary embolism, relative contraindication to chronic anticoagulation  POST-OPERATIVE DIAGNOSIS: same as above   SURGEON: Leonides Sake, MD  ANESTHESIA: conscious sedation  ESTIMATED BLOOD LOSS: 30 cc  CONTRAST: 100 cc  FINDING(S):  Aorta: patent  Superior mesenteric artery: not visualized Celiac artery: patent  Right Left  RA Patent Patent, co-dominant artery visualized  CIA Patent, aneurysmal (1.8 cm luminal diameter), no evidence of bleeding Patent aneurysmal, saccular (3.0 cm luminal diameter), no evidence of bleeding  EIA Patent Patent  IIA Patent Patent  CFA Patent Patent  SFA Patent proximally Patent proximally  PFA Patent Patent   SPECIMEN(S):  none  INDICATIONS:   [name] is a 61 y.o. male who presents with bilateral iliac aneurysms and second bleeding complication on Coumadin.  The patient's morbid obesity degraded the quality of his pelvic CTA, requiring a pelvic angiogram to reinterrogate the iliac aneurysms.  Additionally, his primary physician do not want to continue Coumadin due prior history of bleeding complications.  They request a IVC filter placement as the patient has had a significant pulmonary embolism previously.  The patient presents for: aortogram, bilateral pelvic angiogram, and IVC filter placement.  I discussed with the patient the nature of angiographic procedures, especially the limited patencies of any endovascular intervention.  The patient is aware of that the risks of an angiographic procedure include but are not limited to: bleeding, infection, access site complications, renal failure, embolization, rupture of vessel, dissection,  possible need for emergent surgical intervention, possible need for surgical procedures to treat the patient's pathology, and stroke and death.  I also discussed with the patient risk, benefits, and alternatives to inferior vena caval filter placement.  The risks include but are not limited to: bleeding, infection, possible injury of the inferior vena cava, injury of structures adjacent to the filter including bowel, migration, fracture, continued pulmonary embolization, and caval thrombosis.  The patient acknowledges the risks and agrees to proceed.  DESCRIPTION: After Padilla informed consent was obtained from the patient, the patient was brought back to the angiography suite.  The patient was placed supine upon the angiography table and connected to monitoring equipment.  The patient was then given conscious sedation, the amounts of which are documented in the patient's chart.  The patient was prepped and drape in the standard fashion for an angiographic procedure.  At this point, attention was turned to the right groin.  Under ultrasound guidance, the right common femoral artery was cannulated with a micropuncture needle.  The microwire was advanced into the iliac arterial system.  The needle was exchanged for a microsheath, which was loaded into the common femoral artery over the wire.  The microwire was exchanged for a [facility] wire which was advanced into the aorta.  The microsheath was then exchanged for a 5-Fr sheath which was loaded into the common femoral artery.  The Omniflush catheter was then loaded over the wire up to the level of L1.  The catheter was connected to the power injector circuit.  After de-airring and de-clotting the circuit, a power injector aortogram was completed.  The findings are documented above.  I pulled the catheter down to just proximal to the aortic bifurcation.  LAO and RAO pelvic  injections were completed to fully image the iliac aneurysms.   At this point, I replaced the wire  in the catheter, straightening out the crook of the catheter.  Both were removed from the right femoral sheath.  The sheath was aspirated.  No clots were present and the sheath was reloaded with heparinized saline.  In regards to the iliac aneurysms, no immediate intervention is necessary.  Until the source of this patient's bleeding is identified, I don't recommend proceeding with any intervention.    At this point, I cannulated the right common femoral vein under ultrasound guidance.  I placed the [facility] wire into the inferior vena cava.  The Cook Celect sheath was loaded with dilator over the wire and advanced to roughly the level of the renal veins.  The dilator was connected to the power injector circuit.  A power injector cavogram was completed.  Bilateral renal veins were above the level of the top of L1.  I push the sheath up to the level of the bottom of L1 over the wire.  The wire and dilator were removed from the sheath.  The femoral Celect filter was loaded through the sheath and the sheath was pulled back while holding the filter deployment cannula still.  This unsheathed the filter, deploying it at the bottom of L1, below the level of the renal veins.  This position was documented on fluoroscopy.  The femoral venous sheath was pulled and pressure held for 15 minutes.  In a similar fashion, the femoral arterial sheath was also pulled and pressure held for 15 minutes.  There were no bleeding complications at this time.  COMPLICATIONS: none  CONDITION: stable   Leonides Sake, MD [facility] Office: [phone] Pager: [phone]  05/14/2011, 11:54 AM

## 2011-05-14 NOTE — H&P (Signed)
[facility] [place]  History and Physical Update  The patient was interviewed and re-examined.  The patient's previous History and Physical has been reviewed and is unchanged from my consult on 05/08/11.  There is no change in the plan of care.  Leonides Sake, MD [facility] [place] Office: [phone] Pager: [phone]  05/14/2011, 7:17 AM

## 2011-05-14 NOTE — Progress Notes (Signed)
PATIENT DETAILS Name: [name] Age: 61 y.o. Sex: male Date of Birth: [birth year] Admit Date: 05/07/2011 PCP:No primary provider on file.  Interim History Patient is a 61 yo male-h/o large PE, PAF-on chronic coumadin therapy presented with sudden onset of Right Thigh pain/numbness. He was found to have a  INR >10 and  Hb of 7 on admit. Further CT Scans did not show any retroperitoneal bleed, but showed Infiltration of fat about the iliopsoas muscle in the proximal thigh with some edematous change within the muscle belly.A CTA of the abdomen demonstrated Bilateral iliac artery aneurysms  with infiltration or hematoma in the fat around the right iliac aneurysm and in the iliopsoas and piriformis muscles. There was no definite evidence of active contrast extravasation. He also gave a h/o of melena as well. He was subsequently admitted, given FFP, Vit K and PRBC transfusion. His Hemoglobin is now stable, he has no further melena, a EGD did not show significant abnormalities. A colonoscopy is currently pending. [facility] has scheduled the patient for a IVC filter placement and Peripheral angiograms on 5/2. His right thigh pain is better, but still has not resolved  Subjective: Denies pain, has some numbness in the right thigh. No other complaints.  Objective: Vital signs in last 24 hours: Filed Vitals:   05/13/11 1333 05/13/11 1436 05/13/11 2110 05/14/11 0500  BP: 132/75 148/76 143/90 138/83  Pulse: 68 68 65 73  Temp: 98 F (36.7 C) 98.6 F (37 C) 98.9 F (37.2 C) 98.4 F (36.9 C)  TempSrc: Oral Oral Oral Oral  Resp: 19 20 24 28   Height:      Weight:      SpO2: 98% 98% 99% 96%    Weight change:   Body mass index is 44.04 kg/(m^2).  Intake/Output from previous day:  Intake/Output Summary (Last 24 hours) at 05/14/11 0825 Last data filed at 05/14/11 0600  Gross per 24 hour  Intake   1490 ml  Output   1050 ml  Net    440 ml    PHYSICAL EXAM: Gen Exam: Awake and alert with clear  speech.   Neck: Supple, No JVD.   Chest: B/L Clear. No rhonci or rales heared CVS: S1 S2 Regular, no murmurs. Abdomen: soft, BS +, non tender, non distended.  Extremities: no edema, lower extremities warm to touch.Right thigh-not distended/swollen Neurologic: Non Focal.   Skin: No Rash.   Wounds: N/A.    CONSULTS:  GI and vascular surgery  LAB RESULTS: CBC  Lab 05/14/11 0538 05/13/11 0520 05/12/11 0647 05/11/11 0535 05/10/11 0540  WBC 7.4 6.5 8.0 8.6 9.0  HGB 8.2* 8.7* 8.4* 8.4* 8.5*  HCT 25.3* 27.4* 26.2* 26.3* 26.3*  PLT 313 324 335 325 328  MCV 87.2 90.1 87.9 87.7 86.5  MCH 28.3 28.6 28.2 28.0 28.0  MCHC 32.4 31.8 32.1 31.9 32.3  RDW 18.1* 18.6* 18.8* 19.2* 19.6*  LYMPHSABS -- -- -- -- --  MONOABS -- -- -- -- --  EOSABS -- -- -- -- --  BASOSABS -- -- -- -- --  BANDABS -- -- -- -- --    Chemistries   Lab 05/14/11 0538 05/13/11 0520 05/12/11 0647 05/11/11 0535 05/10/11 0540  NA 137 139 139 140 140  K 3.2* 3.1* 3.4* 3.6 3.1*  CL 105 105 104 105 104  CO2 23 23 25 26 25   GLUCOSE 85 81 89 94 95  BUN 8 10 8 10 9   CREATININE 0.95 1.03 0.80 0.91 0.76  CALCIUM  8.3* 8.3* 8.2* 8.3* 8.3*  MG -- -- -- -- --    GFR Estimated Creatinine Clearance: 128.9 ml/min (by C-G formula based on Cr of 0.95).  Coagulation profile  Lab 05/14/11 0538 05/12/11 0647 05/11/11 0535 05/10/11 0540 05/09/11 0703  INR 1.33 1.93* 2.19* 3.02* 2.51*  PROTIME -- -- -- -- --   Urine Studies No results found for this basename: UACOL:2,UAPR:2,USPG:2,UPH:2,UTP:2,UGL:2,UKET:2,UBIL:2,UHGB:2,UNIT:2,UROB:2,ULEU:2,UEPI:2,UWBC:2,URBC:2,UBAC:2,CAST:2,CRYS:2,UCOM:2,BILUA:2 in the last 72 hours  MICROBIOLOGY: No results found for this or any previous visit (from the past 240 hour(s)).  RADIOLOGY STUDIES/RESULTS: Ct Abdomen Pelvis Wo Contrast  05/08/2011  *RADIOLOGY REPORT*  Clinical Data: Leg numbness.  Anemia.  Rule out retroperitoneal bleed.  CT ABDOMEN AND PELVIS WITHOUT CONTRAST  Technique:   Multidetector CT imaging of the abdomen and pelvis was performed following the standard protocol without intravenous contrast.  Comparison: CT pelvis 05/07/2011.  CT abdomen and pelvis 08/20/2006  Findings: Slight fibrosis in the lung bases.  Small esophageal hiatal hernia.  Circumscribed cyst in the anterior right lobe of the liver is stable since the prior study.  Unenhanced appearance of the liver, spleen, gallbladder, pancreas, and adrenal glands is otherwise unremarkable.  There is increased density in the renal collecting systems, ureters, and bladder bilaterally suggesting previous contrast administration.  Mild calcification of the abdominal aorta.  Bilateral iliac artery aneurysms, measuring 3.2 cm on the right and 3.6 cm on the left.  There is infiltration around the right iliac artery aneurysm and along the iliopsoas muscle suggesting edema, inflammation, or hematoma, possibly due to rupture.  Asymmetric enlargement of the right piriformis muscle with a vague low attenuation change.  This might represent intramuscular fluid collection or hematoma. Moderate sized umbilical hernia containing fat.  No free air or free fluid in the abdomen.  Stomach, small bowel, and colon are not distended.  Pelvis:  Prostate gland is not enlarged.  Bladder wall is not thickened.  No significant pelvic lymphadenopathy.  Degenerative changes in the lumbar spine with normal alignment.  IMPRESSION: Infiltration around the right iliopsoas muscle with asymmetric enlargement of the iliopsoas and piriformis muscles.  Suggestion of low attenuation change in the right piriformis muscle.  Changes may represent inflammatory edema or hematoma.  There is a right iliac artery aneurysm and rupture is not excluded.  There is also a left iliac artery aneurysm.  Results discussed by telephone at the time of dictation, 0209 hours on 05/08/2011 with  the patient's nurse, Lamar Laundry,  who aknowledged these results and will contact the referring  physician.  Original Report Authenticated By: Marlon Pel, M.D.   Dg Chest 1 View  04/16/2011  *RADIOLOGY REPORT*  Clinical Data: Pharmquest study.  CHEST - 1 VIEW  Comparison: 05/01/2010  Findings: Heart and mediastinal contours are within normal limits. No focal opacities or effusions.  No acute bony abnormality.  IMPRESSION: No active cardiopulmonary disease.  Original Report Authenticated By: Cyndie Chime, M.D.   Dg Chest 2 View  05/07/2011  *RADIOLOGY REPORT*  Clinical Data: Right thigh pain since 10:00 a.m.  No known injury. Shortness of breath.  Numbness.  CHEST - 2 VIEW  Comparison: 04/16/2011  Findings: Shallow inspiration.  Mild cardiac enlargement.  Normal pulmonary vascularity.  No focal airspace consolidation or edema. No blunting of the costophrenic angles.  No pneumothorax. Degenerative changes in the thoracic spine.  No significant change since previous study.  IMPRESSION: No evidence of active pulmonary disease.  Original Report Authenticated By: Marlon Pel, M.D.   Ct Head Wo  Contrast  05/07/2011  *RADIOLOGY REPORT*  Clinical Data: Right leg numbness. Light bothers eyes.  CT HEAD WITHOUT CONTRAST  Technique:  Contiguous axial images were obtained from the base of the skull through the vertex without contrast.  Comparison: None.  Findings: The ventricles and sulci appear symmetrical.  No mass effect or midline shift.  No abnormal extra-axial fluid collections.  Vague focal area of low attenuation in the left parietal white matter posterior to the sylvian fissure.  This may represent partial voluming with the posterior horn of the left lateral ventricle but a focal area of early ischemia can also have this appearance.  No evidence of acute intracranial hemorrhage. Gray-white matter junctions are distinct.  Basal cisterns are not effaced.  No depressed skull fractures.  Vascular calcifications. Visualized paranasal sinuses and mastoid air cells are not opacified.  Old nasal  bone fracture.  IMPRESSION: Nonspecific low attenuation area in the left posterior parietal white matter which could represent early ischemic focus versus partial voluming.  No significant mass effect.  No acute intracranial hemorrhage.  Original Report Authenticated By: Marlon Pel, M.D.   Ct Femur Right W Contrast  05/07/2011  *RADIOLOGY REPORT*  Clinical Data: Right thigh pain.  History of abscess.  CT OF THE RIGHT LOWER EXTREMITY WITH CONTRAST  Contrast: OMNIPAQUE IOHEXOL 300 MG/ML  SOLN  Comparison: CT abdomen and pelvis 08/20/2006.  Findings: No abscess is identified.  There is infiltration of fat about the iliopsoas muscle belly and the muscle appears mildly edematous.  Ossification of the iliopsoas tendon at the lesser trochanter is compatible with old injury/enthesopathic change. Visualized muscles and tendons otherwise are normal in appearance. Subcutaneous fatty tissues are normal in appearance.  No mass is identified.  No bony destructive change or fracture is seen.  Marked appearing degenerative disease about the right knee is noted with a small joint effusion is seen. There is a loose body in the posterior aspect of the joint measuring 1.5 cm in diameter.  Imaged intrapelvic contents demonstrate a right external iliac node measuring 1.5 cm short axis dimension on image 8.  IMPRESSION:  1.  Negative for abscess. 2.  Infiltration of fat about the iliopsoas muscle in the proximal thigh with some edematous change within the muscle belly could be due to infectious or inflammatory change.  Muscle strain could create a similar appearance.  Enthesopathic change of the iliopsoas tendon is again noted. 3.  Small right external iliac lymph node is likely reactive. 4.  Advanced degenerative disease about the right knee.  Original Report Authenticated By: Bernadene Bell. D'ALESSIO, M.D.   Dg Shoulder Left  05/01/2011  *RADIOLOGY REPORT*  Clinical Data: Pain post crushing injury  LEFT SHOULDER - 2+ VIEW   Comparison: None.  Findings: Three views of the left shoulder submitted.  No acute fracture or subluxation.  No radiopaque foreign body.  IMPRESSION: No acute fracture or subluxation.  Original Report Authenticated By: Natasha Mead, M.D.   Dg Humerus Left  05/01/2011  *RADIOLOGY REPORT*  Clinical Data: Injured left arm and shoulder approximately 1 week ago while pulling in self out of a car.  Patient unable to adduct the left arm above chest level.  LEFT HUMERUS - 2+ VIEW 05/01/2011:  Comparison: Left shoulder x-rays obtained concurrently.  Findings: No evidence of acute, subacute, or healed fractures.  No intrinsic osseous abnormalities involving the humerus.  Well- preserved bone mineral density.  Visualized elbow joint intact.  IMPRESSION: Normal examination.  Original Report Authenticated By:  Arnell Sieving, M.D.   Ct Angio Abd/pel W/ And/or W/o  05/08/2011  *RADIOLOGY REPORT*  Clinical Data: Left leg weakness and pain.  Known iliac aneurysms with infiltration or hematoma in the right iliopsoas region on prior unenhanced CT.  Decreasing hemoglobin with multiple transfusions.  CT ANGIOGRAPHY ABDOMEN AND PELVIS WITH CONTRAST AND WITHOUT CONTRAST  Comparison: Unenhanced CT 05/08/2011  Findings: The study is technically limited due to the patient's body habitus.  There is overlying artifact centered at the area of interest.  This limits visualization of this area.  However, there appears to be flow throughout the right and left iliac artery aneurysms without evidence of thrombosis or occlusion.  There is no obvious contrast extravasation although a small focus of active hemorrhage could be obscured.  Again demonstrated is a infiltration or hematoma around the right iliac artery aneurysm and in the iliopsoas recent extending down to the growing and in the piriformis muscles as before.  No significant increase.  The lung bases are clear.  Cyst again demonstrated in the liver. Cyst in the right kidney.  No  hydronephrosis.  Normal homogeneous parenchymal nephrograms.  The gallbladder, pancreas, spleen, adrenal glands, the stomach, small bowel, colon, and retroperitoneal lymph nodes are unremarkable.  Small esophageal hiatal hernia.  Calcification of the aorta.  Broad-based umbilical hernia containing fat.  Pelvis:  Bladder wall is not thickened.  No significant pelvic lymphadenopathy.  No evidence of loculated fluid collection.  The appendix is normal.  IMPRESSION: Technically limited study due to the patient's body habitus. Bilateral iliac artery aneurysms are again demonstrated with infiltration or hematoma in the fat around the right iliac aneurysm and in the iliopsoas and piriformis muscles.  No definite evidence of active contrast extravasation.  Intermittent or low grade leakage is not excluded.  Original Report Authenticated By: Marlon Pel, M.D.    MEDICATIONS: Scheduled Meds:    . antiseptic oral rinse  15 mL Mouth Rinse q12n4p  . atorvastatin  10 mg Oral q1800  . chlorhexidine  15 mL Mouth Rinse BID  . digoxin  0.25 mg Oral Daily  . diltiazem  360 mg Oral Daily  . enoxaparin (LOVENOX) injection  80 mg Subcutaneous Q24H  . fluocinonide ointment  1 application Topical BID  . hydrocortisone cream   Topical BID  . metoprolol tartrate  25 mg Oral BID  . phytonadione (VITAMIN K) IV  1 mg Intravenous Once  . potassium chloride  40 mEq Oral Once  . potassium chloride  40 mEq Oral Once  . sodium chloride  3 mL Intravenous Q12H  . triamcinolone ointment  1 application Topical Daily   Continuous Infusions:    . sodium chloride 40 mL/hr at 05/13/11 1614   PRN Meds:.acetaminophen, acetaminophen, morphine, ondansetron (ZOFRAN) IV, ondansetron, oxyCODONE, oxyCODONE-acetaminophen, sodium chloride, zolpidem  Antibiotics: Anti-infectives    None      Assessment/Plan: Patient Active Hospital Problem List:  Right leg numbness /pain -Is likely secondary to the infiltration/hematoma  seen around the right iliac artery aneurysm -Has no overt weakness, CT scan did not exclude contrast extravasation. -Patient will undergo aortogram with IVC filter placement today.   Supratherapeutic INR  -Patient claims no recent dose changes, denies any antibiotic use as well -INR is pending today.  Anemia -Likely from acute blood loss, iliopsoas bleeding versus GI. -Colonoscopy done, showed no source of active bleeding. -This is his second episode of bleeding, had a significant hematoma in left lower ext 2008 -Hb now stable after 3  units of PRBC transfusion  GI Bleed -Anemia with Hemoccult positive stool. -Colonoscopy done, no evidence of bleeding. Sessile polyps internal and external hemorrhoids and diverticula. -This is likely secondary to his supratherapeutic INR. Hemoglobin stable, GI signed off.  PAF -Atrial fibrillation/flutter. Appreciate [facility] cardiology help. -Recommendation for Xarleto after the IVC placement as noted. -Patient is on Cardizem, digoxin and beta blockers.  Bilateral Iliac Artery Aneurysms -Per [facility]-Angiogram on 05/14/11  HTN (hypertension)  -currently stable off meds  Hypokalemia -will replete  H/O DVT/PE -on chronic coumadin-now off due to Bleeding -talked with Dr Imogene Burn about IVC filter placement-will be placed on 05/14/11 as well  Morbid Obesity: -counseled extensively  Disposition: Remain inpatient  DVT Prophylaxis: SCD's  Code Status Full Code  Clint Lipps Pager: [phone] 05/14/2011, 8:25 AM

## 2011-05-14 NOTE — Progress Notes (Signed)
PT Cancellation Note  Treatment cancelled today due to patient receiving procedure or test.  [facility] 05/14/2011, 12:52 PM

## 2011-05-14 NOTE — Progress Notes (Signed)
[facility]   Case moved to end of day to coordinate IVC filter placement  Laboratory CBC    Component Value Date/Time   WBC 7.4 05/14/2011 0538   HGB 8.2* 05/14/2011 0538   HCT 25.3* 05/14/2011 0538   PLT 313 05/14/2011 0538    BMET    Component Value Date/Time   NA 137 05/14/2011 0538   K 3.2* 05/14/2011 0538   CL 105 05/14/2011 0538   CO2 23 05/14/2011 0538   GLUCOSE 85 05/14/2011 0538   BUN 8 05/14/2011 0538   CREATININE 0.95 05/14/2011 0538   CALCIUM 8.3* 05/14/2011 0538   GFRNONAA 88* 05/14/2011 0538   GFRAA >90 05/14/2011 1610    Leonides Sake, MD [facility] Office: [phone] Pager: [phone]  05/14/2011, 7:22 AM

## 2011-05-14 NOTE — Progress Notes (Signed)
Subjective: Back from OR for placement of IVC filter.   No chest pain, no SOB. Objective: Vital signs in last 24 hours: Temp:  [97.8 F (36.6 C)-98.9 F (37.2 C)] 97.8 F (36.6 C) (05/02 1320) Pulse Rate:  [56-73] 62  (05/02 1320) Resp:  [20-28] 22  (05/02 1320) BP: (119-145)/(70-90) 135/77 mmHg (05/02 1320) SpO2:  [96 %-99 %] 98 % (05/02 1320) Weight change:  Last BM Date: 05/11/11 Intake/Output from previous day: +440 05/01 0701 - 05/02 0700 In: 1490 [P.O.:480; I.V.:960; IV Piggyback:50] Out: 1050 [Urine:1050] Intake/Output this shift: Total I/O In: -  Out: 1900 [Urine:1900]  PE: General:alert, oriented no complaints Heart:S1S2 RRR, no obvious murmur Lungs:clear without rales, rhonchi or wheezes EAV:WUJWJ, non tender, high pitched to tinkling  Bowel sounds Ext:tr. edema Neuro:alert and oriented, MAE  Tele:  SR occ PACs.  No tachycardia.   Lab Results:  Basename 05/14/11 0538 05/13/11 0520  WBC 7.4 6.5  HGB 8.2* 8.7*  HCT 25.3* 27.4*  PLT 313 324   BMET  Basename 05/14/11 0538 05/13/11 0520  NA 137 139  K 3.2* 3.1*  CL 105 105  CO2 23 23  GLUCOSE 85 81  BUN 8 10  CREATININE 0.95 1.03  CALCIUM 8.3* 8.3*   No results found for this basename: TROPONINI:2,CK,MB:2 in the last 72 hours  Lab Results  Component Value Date   CHOL  Value: 194        ATP III CLASSIFICATION:  <200     mg/dL   Desirable  191-478  mg/dL   Borderline High  >=295    mg/dL   High 06/14/1306   HDL 28* 08/13/2006   LDLCALC  Value: 146        Total Cholesterol/HDL:CHD Risk Coronary Heart Disease Risk Table                     Men   Women  1/2 Average Risk   3.4   3.3* 08/13/2006   TRIG 102 08/13/2006   CHOLHDL 6.9 08/13/2006   No results found for this basename: HGBA1C     Lab Results  Component Value Date   TSH 2.159 05/14/2011    Hepatic Function Panel No results found for this basename: PROT,ALBUMIN,AST,ALT,ALKPHOS,BILITOT,BILIDIR,IBILI in the last 72 hours No results found for this  basename: CHOL in the last 72 hours No results found for this basename: PROTIME in the last 72 hours    EKG: Orders placed during the hospital encounter of 05/07/11  . ED EKG  . EKG 12-LEAD  . EKG 12-LEAD  . EKG 12-LEAD  . EKG 12-LEAD  . EKG    Studies/Results: No results found.  Medications: I have reviewed the patient's current medications.    [name] antiseptic oral rinse  15 mL Mouth Rinse q12n4p  . atorvastatin  10 mg Oral q1800  . chlorhexidine  15 mL Mouth Rinse BID  . digoxin  0.25 mg Oral Daily  . diltiazem  360 mg Oral Daily  . enoxaparin (LOVENOX) injection  80 mg Subcutaneous Q24H  . fentaNYL      . fluocinonide ointment  1 application Topical BID  . furosemide  40 mg Intravenous Once  . heparin      . hydrocortisone cream   Topical BID  . lidocaine      . magnesium sulfate 1 - 4 g bolus IVPB  2 g Intravenous Once  . metoprolol tartrate  25 mg Oral BID  . midazolam      .  potassium chloride  60 mEq Oral TID  . sodium chloride  3 mL Intravenous Q12H  . triamcinolone ointment  1 application Topical Daily   Assessment/Plan: Patient Active Problem List  Diagnoses  . Supratherapeutic INR, INR 10 on admission  . HTN (hypertension)  . Melena  . Anemia, Hgb 7.1 on admission  . Hypokalemia  . History of DVT (deep vein thrombosis)  . Right leg numbness, Rt iliopsois hematoma secondary to high INR on adm  . PAF, was to have DCCV 4/12 but was in NSR, now in and out of flutter  . Pulmonary embolism, "Massive" in 2008  . Chronic anticoagulation, PE DVT PAF  . Morbid obesity  . Dyslipidemia  . Psoriasis  . Sleep apnea, suspected  . PVD, bilat iliac aneurysms   PLAN: colonoscopy with sessile polyps, internal and external hemorrhoids.  Polypectomy in the future once vascular issues are resolved.  IVC placed today.  No further a. Flutter/fib. ? Begin Xarelto tomorrow?   Pt may have trouble paying for this medication will have case manager evaluate.  Hypokalemic,  replace. Anemia with H/H of 8.2/25/3  Abd. X-ray:  Inferior vena cava filter positioning as discussed above.  Unchanged colonic distention without associated small bowel  distention suspicious for colonic ileus      LOS: 7 days   INGOLD,LAURA R 05/14/2011, 2:47 PM   Patient seen and examined. Agree with assessment and plan. Back from IVC filter insertion. Occasional ectopy on auscultation. Will titrate metoprolol to 50 mg bid; check ECG in am. As per Dr. Salena Saner, consider xarelto.   Lennette Bihari, MD, [facility] 05/14/2011 3:26 PM

## 2011-05-15 DIAGNOSIS — R209 Unspecified disturbances of skin sensation: Secondary | ICD-10-CM

## 2011-05-15 DIAGNOSIS — D649 Anemia, unspecified: Secondary | ICD-10-CM

## 2011-05-15 DIAGNOSIS — R791 Abnormal coagulation profile: Secondary | ICD-10-CM

## 2011-05-15 DIAGNOSIS — K921 Melena: Secondary | ICD-10-CM

## 2011-05-15 LAB — BASIC METABOLIC PANEL
CO2: 24 mEq/L (ref 19–32)
Calcium: 8.4 mg/dL (ref 8.4–10.5)
Chloride: 105 mEq/L (ref 96–112)
Creatinine, Ser: 1.03 mg/dL (ref 0.50–1.35)
Glucose, Bld: 85 mg/dL (ref 70–99)

## 2011-05-15 LAB — MAGNESIUM: Magnesium: 2.2 mg/dL (ref 1.5–2.5)

## 2011-05-15 MED ORDER — METOPROLOL TARTRATE 50 MG PO TABS: 50.0000 mg | ORAL_TABLET | Freq: Two times a day (BID) | ORAL | Status: DC

## 2011-05-15 MED ORDER — POLYETHYLENE GLYCOL 3350 17 GM/SCOOP PO POWD: 17.0000 g | Freq: Every day | ORAL | Status: AC

## 2011-05-15 MED ORDER — RIVAROXABAN 20 MG PO TABS: 20.0000 mg | ORAL_TABLET | Freq: Every day | ORAL | Status: DC

## 2011-05-15 MED ORDER — SIMETHICONE 80 MG PO CHEW
160.0000 mg | CHEWABLE_TABLET | Freq: Four times a day (QID) | ORAL | Status: DC | PRN
  Administered 2011-05-15: 160 mg via ORAL
  Filled 2011-05-15: qty 2

## 2011-05-15 MED ORDER — OXYCODONE-ACETAMINOPHEN 10-325 MG PO TABS: 1.0000 | ORAL_TABLET | ORAL | Status: DC | PRN

## 2011-05-15 MED ORDER — WARFARIN SODIUM 10 MG PO TABS: 10.0000 mg | ORAL_TABLET | Freq: Every day | ORAL | Status: DC

## 2011-05-15 NOTE — Progress Notes (Signed)
Pt. Seen and examined. Agree with the NP/PA-C note as written.  Discussed possibly adding xarelto but may be cost-prohibitive. Appreciate social work/case management assistance. If patient can afford it, would start xarelto. Will sign-off for now, plan follow-up with Dr. Herbie Baltimore in our office.  Chrystie Nose, MD, [facility] Attending Cardiologist [facility]

## 2011-05-15 NOTE — Care Management Note (Signed)
    Page 1 of 2   05/15/2011     2:00:20 PM   CARE MANAGEMENT NOTE 05/15/2011  Patient:  [name], [name]   Account Number:  [id number]  Date Initiated:  05/08/2011  Documentation initiated by:  Donn Pierini  Subjective/Objective Assessment:   Pt admitted with right leg numbness     Action/Plan:   PTA pt lived at home alone, PT eval ordered   Anticipated DC Date:  05/11/2011   Anticipated DC Plan:  SKILLED NURSING FACILITY  In-house referral  Clinical Social Worker      DC Planning Services  CM consult      Choice offered to / List presented to:             Status of service:  Completed, signed off Medicare Important Message given?   (If response is "NO", the following Medicare IM given date fields will be blank) Date Medicare IM given:   Date Additional Medicare IM given:    Discharge Disposition:  SKILLED NURSING FACILITY  Per UR Regulation:    If discussed at Long Length of Stay Meetings, dates discussed:    Comments:  PCP-  05/15/11- 1230- Donn Pierini RN, BSN [phone] Pt for d/c to SNF today, CSW following for placement needs. Referral received regarding Xarelto benefits- benefits check completed with pt's insurance- per [employer] rep- pt's insurance does not cover Xarelto- MD notified of non coverage of Xarelto.  05/11/11 15:16 Letha Cape RN, BSN [phone] patient for angiogran and ivc filter on Thursday.  Physical therapy recs snf for disposisiton. CSW following.  05/08/11- 1200- Donn Pierini RN, BSN [phone] Possible EGD today ? GIB- subtherapeutic INR - will transfuse FFP today,  PT eval pending- CM to follow for d/c needs-

## 2011-05-15 NOTE — Progress Notes (Signed)
PT Cancellation Note  Treatment cancelled today due to pt wanted to wait until later.  Unsure if able to come back later.[name]  [name] 05/15/2011, 2:14 PM

## 2011-05-15 NOTE — Progress Notes (Signed)
[facility]  Subjective: Abdomin distended.  No CP, SOB, Orthopnea.  Objective: Vital signs in last 24 hours: Temp:  [97.8 F (36.6 C)-98.8 F (37.1 C)] 98 F (36.7 C) (05/03 0845) Pulse Rate:  [56-91] 64  (05/03 0845) Resp:  [19-26] 19  (05/03 0845) BP: (116-144)/(71-86) 142/84 mmHg (05/03 0845) SpO2:  [95 %-98 %] 97 % (05/03 0845) Last BM Date: 05/13/11  Intake/Output from previous day: 05/02 0701 - 05/03 0700 In: 2882.6 [P.O.:240; I.V.:2642.6] Out: 4300 [Urine:4300] Intake/Output this shift:    Medications Current Facility-Administered Medications  Medication Dose Route Frequency Provider Last Rate Last Dose  . 0.9 %  sodium chloride infusion  1 mL/kg/hr Intravenous Continuous Fransisco Hertz, MD 155.6 mL/hr at 05/15/11 0455 1 mL/kg/hr at 05/15/11 0455  . acetaminophen (TYLENOL) tablet 650 mg  650 mg Oral Q4H PRN Fransisco Hertz, MD      . acetaminophen (TYLENOL) tablet 650 mg  650 mg Oral Q4H PRN Fransisco Hertz, MD      . atorvastatin (LIPITOR) tablet 10 mg  10 mg Oral q1800 Marinda Elk, MD   10 mg at 05/14/11 1744  . digoxin (LANOXIN) tablet 0.25 mg  0.25 mg Oral Daily Marykay Lex, MD   0.25 mg at 05/14/11 1001  . diltiazem (CARDIZEM CD) 24 hr capsule 360 mg  360 mg Oral Daily Maretta Bees, MD   360 mg at 05/14/11 0958  . enoxaparin (LOVENOX) injection 80 mg  80 mg Subcutaneous Q24H Maretta Bees, MD   80 mg at 05/14/11 2236  . fentaNYL (SUBLIMAZE) 0.05 MG/ML injection           . fluocinonide ointment (LIDEX) 0.05 % 1 application  1 application Topical BID Marinda Elk, MD   1 application at 05/14/11 2237  . furosemide (LASIX) injection 40 mg  40 mg Intravenous Once Clydia Llano, MD   40 mg at 05/14/11 1248  . heparin 2-0.9 UNIT/ML-% infusion           . hydrocortisone cream 1 %   Topical BID Marinda Elk, MD      . lidocaine (XYLOCAINE) 1 % injection           . magnesium sulfate IVPB 2 g 50 mL  2 g Intravenous  Once Clydia Llano, MD   2 g at 05/14/11 1253  . metoprolol (LOPRESSOR) tablet 50 mg  50 mg Oral BID Lennette Bihari, MD   50 mg at 05/14/11 2346  . midazolam (VERSED) 2 MG/2ML injection           . morphine 2 MG/ML injection 2 mg  2 mg Intravenous Q3H PRN Fransisco Hertz, MD      . ondansetron [facility]) injection 4 mg  4 mg Intravenous Q6H PRN Fransisco Hertz, MD      . ondansetron [facility]) tablet 4 mg  4 mg Oral Q6H PRN Marinda Elk, MD      . oxyCODONE-acetaminophen [facility]) 5-325 MG per tablet 1-2 tablet  1-2 tablet Oral Q3H PRN Fransisco Hertz, MD   2 tablet at 05/14/11 2236  . potassium chloride SA (K-DUR,KLOR-CON) CR tablet 60 mEq  60 mEq Oral TID Clydia Llano, MD   60 mEq at 05/14/11 1743  . sodium chloride 0.9 % injection 3 mL  3 mL Intravenous Q12H Marinda Elk, MD   3 mL at 05/14/11 1257  . sodium chloride 0.9 % injection 3 mL  3 mL Intravenous PRN Marinda Elk, MD      . triamcinolone ointment (KENALOG) 0.1 % 1 application  1 application Topical Daily Marinda Elk, MD   1 application at 05/12/11 1113  . zolpidem (AMBIEN) tablet 5 mg  5 mg Oral QHS PRN Maretta Bees, MD   5 mg at 05/14/11 2236  . DISCONTD: 0.9 %  sodium chloride infusion   Intravenous Continuous Clydia Llano, MD 20 mL/hr at 05/14/11 0836    . DISCONTD: acetaminophen (TYLENOL) suppository 650 mg  650 mg Rectal Q6H PRN Marinda Elk, MD      . DISCONTD: acetaminophen (TYLENOL) tablet 650 mg  650 mg Oral Q6H PRN Marinda Elk, MD      . DISCONTD: antiseptic oral rinse (BIOTENE) solution 15 mL  15 mL Mouth Rinse q12n4p Marinda Elk, MD   15 mL at 05/14/11 1600  . DISCONTD: chlorhexidine (PERIDEX) 0.12 % solution 15 mL  15 mL Mouth Rinse BID Marinda Elk, MD   15 mL at 05/14/11 0755  . DISCONTD: metoprolol tartrate (LOPRESSOR) tablet 25 mg  25 mg Oral BID Marykay Lex, MD   25 mg at 05/14/11 1002  . DISCONTD: morphine 4 MG/ML injection 4 mg  4 mg Intravenous Q4H PRN Marinda Elk, MD      . DISCONTD: ondansetron [facility]) injection 4 mg  4 mg Intravenous Q6H PRN Marinda Elk, MD      . DISCONTD: oxyCODONE (Oxy IR/ROXICODONE) immediate release tablet 5 mg  5 mg Oral Q4H PRN Marinda Elk, MD   5 mg at 05/08/11 1012  . DISCONTD: oxyCODONE-acetaminophen (PERCOCET) 5-325 MG per tablet 1 tablet  1 tablet Oral Q4H PRN Marinda Elk, MD   1 tablet at 05/14/11 0754    PE: General appearance: alert, cooperative and no distress Lungs: clear to auscultation bilaterally Heart: regular rate and rhythm, S1, S2 normal, no murmur, click, rub or gallop Abdomen: +BS all quads and sound tympanic.  Abd distended.  Nontender. Extremities: No LEE Pulses: 2+ and symmetric  Lab Results:   Basename 05/14/11 0538 05/13/11 0520  WBC 7.4 6.5  HGB 8.2* 8.7*  HCT 25.3* 27.4*  PLT 313 324   BMET  Basename 05/15/11 0600 05/14/11 0538 05/13/11 0520  NA 138 137 139  K 3.7 3.2* 3.1*  CL 105 105 105  CO2 24 23 23   GLUCOSE 85 85 81  BUN 9 8 10   CREATININE 1.03 0.95 1.03  CALCIUM 8.4 8.3* 8.3*   PT/INR  Basename 05/14/11 0538  LABPROT 16.7*  INR 1.33   Cholesterol No results found for this basename: CHOL in the last 72 hours Cardiac Enzymes No components found with this basename: TROPONIN:3, CKMB:3  Studies/Results: ABDOMEN - 1 VIEW  Comparison: 05/11/2011  Findings: The inferior vena cava filter is positioned adjacent to  the right L1/L2 vertebral level.  Diffuse colonic distention is again noted to the level of the  distal descending colon and the degree of distention is unchanged  in comparison with prior exam. No significant small bowel  distention is seen suggesting diffuse colonic ileus.  Degenerative change of the mid lumbar spine is seen.  IMPRESSION:  Inferior vena cava filter positioning as discussed above.  Unchanged colonic distention without associated small bowel  distention suspicious for colonic ileus  Assessment/Plan      Principal Problem:  *Right leg numbness, Rt iliopsois hematoma secondary to high INR on adm Active Problems:  Supratherapeutic INR, INR 10 on admission  HTN (hypertension)  Melena  Anemia, Hgb 7.1 on admission  Hypokalemia  History of DVT (deep vein thrombosis)  PAF, was to have DCCV 4/12 but was in NSR, now in and out of flutter  Pulmonary embolism, "Massive" in 2008  Chronic anticoagulation, PE DVT PAF  Morbid obesity  Dyslipidemia  Psoriasis  Sleep apnea, suspected  PVD, bilat iliac aneurysms  Plan:  S/P IVC filter placement 05/14/11.  Colonoscopy 05/12/11: future polypectomy a possiblilty as OP.   PAF last year and spontaneously converted 04/2010 prior to DCCV.  Maintaining NSR in the 60's.  Lopressor titrated to 50mg  BID yesterday.  HGB 8.2 yesterday. I recommend continuing to follow.  Consider Xarelto, however, it can be cost prohibitive depending on insurance.  Need Case Mgt consult to med assistance.   Adm xray suspicious for colonic ileus.  Last BM was the day of colonoscopy.  The patient is not passing gas but indicated he needs to have a BM soon.   LOS: 8 days    Abeeha Twist W 05/15/2011 9:22 AM

## 2011-05-15 NOTE — Discharge Summary (Addendum)
HOSPITAL DISCHARGE SUMMARY  [name]  MRN: [id number]  DOB:1950-04-22  Date of Admission: 05/07/2011 Date of Discharge: 05/15/2011         LOS: 8 days   Attending Physician:  Clydia Llano A  Patient's PCP:  No primary provider on file.  Consults: Treatment Team:  Fransisco Hertz, MD with Vascular surgery Bryan Lemma with [facility] heart and vascular Jeani Hawking with GI  Discharge Diagnoses: Present on Admission:  .Supratherapeutic INR, INR 10 on admission .HTN (hypertension) .Melena .Anemia, Hgb 7.1 on admission .Hypokalemia .Right leg numbness, Rt iliopsois hematoma secondary to high INR on adm .PAF, was to have DCCV 4/12 but was in NSR, now in and out of flutter .Morbid obesity .Dyslipidemia .Psoriasis .Sleep apnea, suspected .PVD, bilat iliac aneurysms   Medication List  As of 05/15/2011 12:20 PM   TAKE these medications         desonide 0.05 % cream   Commonly known as: DESOWEN   Apply 1 application topically daily. Apply to rash on face      digoxin 0.125 MG tablet   Commonly known as: LANOXIN   Take 125 mcg by mouth daily.      diltiazem 360 MG 24 hr capsule   Commonly known as: CARDIZEM CD   Take 360 mg by mouth daily.      fluocinonide ointment 0.05 %   Commonly known as: LIDEX   Apply 1 application topically 2 (two) times daily. Apply to dry skin areas on body      metoprolol 50 MG tablet   Commonly known as: LOPRESSOR   Take 1 tablet (50 mg total) by mouth 2 (two) times daily.      oxyCODONE-acetaminophen 10-325 MG per tablet   Commonly known as: PERCOCET   Take 1 tablet by mouth every 4 (four) hours as needed. pain      polyethylene glycol powder powder   Commonly known as: GLYCOLAX/MIRALAX   Take 17 g by mouth daily.      pravastatin 40 MG tablet   Commonly known as: PRAVACHOL   Take 40 mg by mouth daily.      triamcinolone ointment 0.1 %   Commonly known as: KENALOG   Apply 1 application topically daily. Apply to skin rash areas  on body      warfarin 10 MG tablet   Commonly known as: COUMADIN   Take 1 tablet (10 mg total) by mouth daily.             Brief Admission History: This is a [age] year old male with past medical history of left lower extremity infected hematoma status post incision and drainage in 2008 on Coumadin for a massive right pulmonary emboli followed by his PCP, also hypertension that comes in for right leg numbness. UA E. woke up at 2 AM in his right leg was numb and thought it was getting out. So he called his neighbor he was brought here to the ED. Here in the ED he is complaining of leg numbness keeps giving out. He relates is not weak but is just slightly numb the lateral aspect of the thigh. He relates no recent falls no dizziness, chest pain or shortness of breath. He does relate a large melanotic stool yesterday. No further episodes of bleeding. Her hemoglobin here in the ED shows a 7.4 fasting glucose 13 this was back in 2008. He had guaiac stool here in the ED which was negative.  He relates he saw his primary care  doctor about 5 days prior to admission. Regimen he was bleeding from his mouth. He told him he had something while with this tooth  Hospital Course: Present on Admission:  .Supratherapeutic INR, INR 10 on admission .HTN (hypertension) .Melena .Anemia, Hgb 7.1 on admission .Hypokalemia .Right leg numbness, Rt iliopsois hematoma secondary to high INR on adm .PAF, was to have DCCV 4/12 but was in NSR, now in and out of flutter .Morbid obesity .Dyslipidemia .Psoriasis .Sleep apnea, suspected .PVD, bilat iliac aneurysms  1. Right iliopsoas hematoma: This is likely secondary spontaneous bleeding secondary to supratherapeutic INR. When patient came in to the hospital his INR was over 10.0. This is closed right leg numbness. Vascular surgery was consulted and recommended to evaluate patient with CTA. CTA showed some bulging in the right iliac artery consistent with aneurysm and  did not exclude contrast extravasation. Aortogram was done by Dr. Imogene Burn on 05/14/2011 and it showed a patent artery no evidence of extravasation of contrast. Patient also had IVC filter placed in the same day.  2. Supratherapeutic INR: As mentioned above patient's INR was more than 10 at the time of admission. This is probably what caused the spontaneous right iliopsoas bleeding. Patient also has questionable melena which evaluated by GI and colonoscopy failed to show active source of bleeding. Patient is getting Coumadin for history of DVT and paroxysmal atrial fibrillation. Patient was seen by cardiology in the hospital and he was thought to be optimal candidate for [facility]. Unfortunately patient insurance does not cover Xarleto, patient has to be back on Coumadin. Patient was taking 15 mg of Coumadin before he came here and he started back on 10 mg a time of discharge.  3. Anemia: This is likely acute blood loss anemia. From the bleeding into the iliopsoas muscle versus GI bleeding. Patient had reported melena at the time of he came in and he had a fecal occult positive stools. Patient required transfusion of 3 units of packed RBCs his hemoglobin is stable now. It is worth it to mention that this is his second episode of bleeding, patient has significant left lower extremity hematoma in 2008.  4 GI bleeding: Patient reported that she have black stools and his Hemoccults were positive in the hospital. Gastroenterology was consulted, colonoscopy was done by Dr. Elnoria Howard, showed sessile polyps and hemorrhoids. Patient also have some diverticula but no active source of bleeding.  5. Paroxysmal atrial fibrillation: While in the hospital but patient was in and out of atrial fibrillation/flutter. Says he said heart and vascular cardiology was consulted patient was on Cardizem and digoxin, metoprolol 50 mg was added and was successful to control his heart rate. Cardiology recommended several after the IVC filter  placed, because of financial problems patient started back on Coumadin. Patient insurance does not cover Xarleto.  6. Bilateral iliac artery aneurysm: This is showed up in the CTA, as mentioned above Dr. Imogene Burn from [facility], did aortogram on [phone] which showed saccular aneurysm in the left common iliac artery measures 3.0 cm, aneurysmal right CIA measures about 1.8 cm. With both there is no evidence of bleeding. Other findings of the aortogram showed patent arteries.  7. Constipation: Patient was having regular bowel movements till he had his colonoscopy on 05/12/2011. Since then patient did have constipation. Abdominal x-ray done after IVC filter placement on May 2 and showed questionable ileus. Patient did have a bowel movement today, because of he will be on narcotic pain medications I added MiraLax.  Day of Discharge BP  139/86  Pulse 65  Temp(Src) 98 F (36.7 C) (Oral)  Resp 19  Ht 6\' 2"  (1.88 m)  Wt 155.584 kg (343 lb)  BMI 44.04 kg/m2  SpO2 97% Physical Exam: GEN: No acute distress, cooperative with exam PSYCH: He is alert and oriented x4; does not appear anxious does not appear depressed; affect is normal  HEENT: Mucous membranes pink and anicteric;  Mouth: without oral thrush or lesions Eyes: PERRLA; EOM intact;  Neck: no cervical lymphadenopathy nor thyromegaly or carotid bruit; no JVD;  CHEST WALL: No tenderness, symmetrical to breathing bilaterally CHEST: Normal respiration, clear to auscultation bilaterally  HEART: Regular rate and rhythm; no murmurs, rubs or gallops, S1 and S2 heard  BACK: No kyphosis or scoliosis; no CVA tenderness  ABDOMEN:  soft non-tender; no masses, no organomegaly, normal abdominal bowel sounds; no pannus; no intertriginous candida.  EXTREMITIES: No bone or joint deformity; no edema; no ulcerations.  PULSES: 2+ and symmetric, neurovascularity is intact SKIN: Normal hydration no rash or ulceration, no flushing or suspicious lesions  CNS: Cranial nerves  2-12 grossly intact no focal neurologic deficit, coordination is intact gait not tested    Results for orders placed during the hospital encounter of 05/07/11 (from the past 24 hour(s))  BASIC METABOLIC PANEL     Status: Abnormal   Collection Time   05/15/11  6:00 AM      Component Value Range   Sodium 138  135 - 145 (mEq/L)   Potassium 3.7  3.5 - 5.1 (mEq/L)   Chloride 105  96 - 112 (mEq/L)   CO2 24  19 - 32 (mEq/L)   Glucose, Bld 85  70 - 99 (mg/dL)   BUN 9  6 - 23 (mg/dL)   Creatinine, Ser 7.82  0.50 - 1.35 (mg/dL)   Calcium 8.4  8.4 - 95.6 (mg/dL)   GFR calc non Af Amer 76 (*) >90 (mL/min)   GFR calc Af Amer 89 (*) >90 (mL/min)  MAGNESIUM     Status: Normal   Collection Time   05/15/11  6:00 AM      Component Value Range   Magnesium 2.2  1.5 - 2.5 (mg/dL)    Disposition: SNF Special instructions: Weight bearing as tolerated.  Follow-up Appts: Discharge Orders    Future Orders Please Complete By Expires   Diet - low sodium heart healthy      Increase activity slowly           I spent 40 minutes completing paperwork and coordinating discharge efforts.  SignedClydia Llano A 05/15/2011, 12:20 PM

## 2011-05-15 NOTE — Progress Notes (Signed)
Utilization review complete 

## 2011-05-15 NOTE — Progress Notes (Signed)
.  Clinical social worker assisted with patient discharge to skilled nursing facility, [facility]. .Patient transportation provided by [employer] with patient chart copy. .No further Clinical Social Work needs, signing off.   Catha Gosselin, Theresia Majors  [phone] .05/15/2011 1657pm

## 2011-05-15 NOTE — Progress Notes (Signed)
Pt discharged to [facility] per MD order.  IV removed, dressing and pressure applied, site unremarkable.  All belongings sent with pt and all questions answered.  Discharge packet sent with pt.  Pt transported to facility by [facility].

## 2011-05-29 ENCOUNTER — Telehealth: Payer: Self-pay | Admitting: Vascular Surgery

## 2011-05-29 ENCOUNTER — Other Ambulatory Visit: Payer: Self-pay | Admitting: Orthopedic Surgery

## 2011-05-29 DIAGNOSIS — R531 Weakness: Secondary | ICD-10-CM

## 2011-05-29 NOTE — Telephone Encounter (Addendum)
Message copied by Shari Prows on Fri May 29, 2011 10:36 AM ------      Message from: Phillips Odor      Created: Thu May 28, 2011  3:45 PM      Regarding: f/u appt                   ----- Message -----         From: Fransisco Hertz, MD         Sent: 05/28/2011   9:57 AM           To: Melene Plan, RN            This patient needs to be scheduled for follow appointment within the next 2-4 weeks.  I scheduled an appointment for this pt on Friday 06/26/11 at 8:45am. Pt is aware and I mailed appt letter to his home as well. Jacklyn Shell

## 2011-06-07 ENCOUNTER — Other Ambulatory Visit: Payer: Medicare Other

## 2011-06-10 ENCOUNTER — Other Ambulatory Visit: Payer: Medicare Other

## 2011-06-26 ENCOUNTER — Ambulatory Visit: Payer: Medicare Other | Admitting: Vascular Surgery

## 2011-06-29 ENCOUNTER — Encounter (HOSPITAL_COMMUNITY): Payer: Self-pay | Admitting: Emergency Medicine

## 2011-06-29 ENCOUNTER — Emergency Department (HOSPITAL_COMMUNITY)
Admission: EM | Admit: 2011-06-29 | Discharge: 2011-06-29 | Disposition: A | Discharge status: Home / Self Care | Payer: Medicare Other | Attending: Emergency Medicine | Admitting: Emergency Medicine

## 2011-06-29 DIAGNOSIS — G8929 Other chronic pain: Secondary | ICD-10-CM | POA: Insufficient documentation

## 2011-06-29 DIAGNOSIS — Z87891 Personal history of nicotine dependence: Secondary | ICD-10-CM | POA: Insufficient documentation

## 2011-06-29 DIAGNOSIS — Z86718 Personal history of other venous thrombosis and embolism: Secondary | ICD-10-CM | POA: Insufficient documentation

## 2011-06-29 DIAGNOSIS — R1110 Vomiting, unspecified: Secondary | ICD-10-CM

## 2011-06-29 DIAGNOSIS — R109 Unspecified abdominal pain: Secondary | ICD-10-CM

## 2011-06-29 DIAGNOSIS — I10 Essential (primary) hypertension: Secondary | ICD-10-CM | POA: Insufficient documentation

## 2011-06-29 DIAGNOSIS — M129 Arthropathy, unspecified: Secondary | ICD-10-CM | POA: Insufficient documentation

## 2011-06-29 LAB — URINALYSIS, ROUTINE W REFLEX MICROSCOPIC
Bilirubin Urine: NEGATIVE
Glucose, UA: NEGATIVE mg/dL
Hgb urine dipstick: NEGATIVE
Ketones, ur: NEGATIVE mg/dL
Leukocytes, UA: NEGATIVE
Protein, ur: NEGATIVE mg/dL
pH: 5.5 (ref 5.0–8.0)

## 2011-06-29 LAB — BASIC METABOLIC PANEL
BUN: 12 mg/dL (ref 6–23)
CO2: 26 mEq/L (ref 19–32)
Calcium: 8.9 mg/dL (ref 8.4–10.5)
Chloride: 105 mEq/L (ref 96–112)
Creatinine, Ser: 0.72 mg/dL (ref 0.50–1.35)
Glucose, Bld: 105 mg/dL — ABNORMAL HIGH (ref 70–99)

## 2011-06-29 LAB — CBC
HCT: 33.5 % — ABNORMAL LOW (ref 39.0–52.0)
Hemoglobin: 10.4 g/dL — ABNORMAL LOW (ref 13.0–17.0)
MCHC: 31 g/dL (ref 30.0–36.0)
MCV: 85.9 fL (ref 78.0–100.0)
RDW: 17.1 % — ABNORMAL HIGH (ref 11.5–15.5)
WBC: 8.2 10*3/uL (ref 4.0–10.5)

## 2011-06-29 LAB — DIFFERENTIAL
Basophils Absolute: 0 10*3/uL (ref 0.0–0.1)
Eosinophils Relative: 1 % (ref 0–5)
Lymphocytes Relative: 9 % — ABNORMAL LOW (ref 12–46)
Monocytes Absolute: 0.6 10*3/uL (ref 0.1–1.0)
Monocytes Relative: 8 % (ref 3–12)
Neutro Abs: 6.8 10*3/uL (ref 1.7–7.7)

## 2011-06-29 MED ORDER — ONDANSETRON 8 MG PO TBDP: 8.0000 mg | ORAL_TABLET | Freq: Three times a day (TID) | ORAL | Status: AC | PRN

## 2011-06-29 NOTE — ED Provider Notes (Signed)
History     CSN: [id number]  Arrival date & time 06/29/11  1339   First MD Initiated Contact with Patient 06/29/11 1530      Chief Complaint  Patient presents with  . Abdominal Pain  . Nausea    (Consider location/radiation/quality/duration/timing/severity/associated sxs/prior treatment) HPI Comments: Patient comes in today with a chief complaint of abdominal pain.  He reports that he began having the pain this morning.  He had a BM after the pain started and the pain has since resolved.  No abdominal pain at this time.  He reports one episode of vomiting this morning.  No vomiting since that time.  No nausea at this time.  No dysuria or hematuria.  No diarrhea.  No fever or chills.    Patient is a 61 y.o. male presenting with abdominal pain. The history is provided by the patient.  Abdominal Pain The primary symptoms of the illness include abdominal pain and vomiting. The primary symptoms of the illness do not include fever, shortness of breath, diarrhea or dysuria.  Symptoms associated with the illness do not include chills, constipation, urgency, hematuria or frequency.    Past Medical History  Diagnosis Date  . Hypertension   . Chronic pain   . Obesity   . DVT (deep venous thrombosis)   . Dysrhythmia     atrial fibrilation  . Shortness of breath   . Blood transfusion 05/07/2011     " no reaction "  . Arthritis     Past Surgical History  Procedure Date  . Knee surgery   . Esophagogastroduodenoscopy 05/08/2011    Procedure: ESOPHAGOGASTRODUODENOSCOPY (EGD);  Surgeon: Theda Belfast, MD;  Location: [facility] ENDOSCOPY;  Service: Endoscopy;  Laterality: N/A;  . Colonoscopy 05/11/2011    Procedure: COLONOSCOPY;  Surgeon: Charna Elizabeth, MD;  Location: [facility] ENDOSCOPY;  Service: Endoscopy;  Laterality: N/A;  . Colonoscopy 05/12/2011    Procedure: COLONOSCOPY;  Surgeon: Theda Belfast, MD;  Location: [facility] ENDOSCOPY;  Service: Endoscopy;  Laterality: N/A;  may be cx abnormal ct    Family  History  Problem Relation Age of Onset  . Cancer Mother     History  Substance Use Topics  . Smoking status: Former Smoker -- 0.5 packs/day for 2 years  . Smokeless tobacco: Never Used  . Alcohol Use: Yes     rarely      Review of Systems  Constitutional: Negative for fever and chills.  Respiratory: Negative for shortness of breath.   Cardiovascular: Negative for chest pain.  Gastrointestinal: Positive for vomiting and abdominal pain. Negative for diarrhea, constipation, blood in stool, abdominal distention and anal bleeding.  Genitourinary: Negative for dysuria, urgency, frequency, hematuria, flank pain, decreased urine volume, penile swelling, scrotal swelling, difficulty urinating, penile pain and testicular pain.  Neurological: Negative for dizziness, syncope and light-headedness.    Allergies  Review of patient's allergies indicates no known allergies.  Home Medications   Current Outpatient Rx  Name Route Sig Dispense Refill  . ALLOPURINOL 100 MG PO TABS Oral Take 100 mg by mouth daily.    . DESONIDE 0.05 % EX CREA Topical Apply 1 application topically daily. Apply to rash on face    . DIGOXIN 0.125 MG PO TABS Oral Take 125 mcg by mouth daily. Hold if pulse less than 60.    [name] DILTIAZEM HCL ER COATED BEADS 360 MG PO CP24 Oral Take 360 mg by mouth daily.    [name] FLUOCINONIDE 0.05 % EX OINT Topical Apply 1  application topically 2 (two) times daily. Apply to dry skin areas on body    . METOPROLOL TARTRATE 50 MG PO TABS Oral Take 1 tablet (50 mg total) by mouth 2 (two) times daily.    . OXYCODONE-ACETAMINOPHEN 10-325 MG PO TABS Oral Take 1 tablet by mouth every 6 (six) hours as needed. Pain.    [name] PRAVASTATIN SODIUM 40 MG PO TABS Oral Take 40 mg by mouth daily.    . TRIAMCINOLONE ACETONIDE 0.1 % EX OINT Topical Apply 1 application topically daily. Apply to skin rash areas on body    . WARFARIN SODIUM 5 MG PO TABS Oral Take 5 mg by mouth daily.      BP 128/79  Pulse 69  Temp  98.3 F (36.8 C) (Oral)  Resp 16  SpO2 100%  Physical Exam  Nursing note and vitals reviewed. Constitutional: He appears well-developed and well-nourished. No distress.  HENT:  Head: Normocephalic and atraumatic.  Mouth/Throat: Oropharynx is clear and moist.  Neck: Normal range of motion. Neck supple.  Cardiovascular: Normal rate, regular rhythm and normal heart sounds.   Pulmonary/Chest: Effort normal and breath sounds normal. No respiratory distress. He has no wheezes.  Abdominal: Soft. Bowel sounds are normal. He exhibits no distension and no mass. There is no tenderness. There is no rebound and no guarding.  Musculoskeletal: Normal range of motion.  Neurological: He is alert.  Skin: Skin is warm and dry. He is not diaphoretic. No erythema.  Psychiatric: He has a normal mood and affect.    ED Course  Procedures (including critical care time)  Labs Reviewed  CBC - Abnormal; Notable for the following:    RBC 3.90 (*)     Hemoglobin 10.4 (*)     HCT 33.5 (*)     RDW 17.1 (*)     All other components within normal limits  DIFFERENTIAL - Abnormal; Notable for the following:    Neutrophils Relative 82 (*)     Lymphocytes Relative 9 (*)     All other components within normal limits  BASIC METABOLIC PANEL - Abnormal; Notable for the following:    Glucose, Bld 105 (*)     All other components within normal limits  URINALYSIS, ROUTINE W REFLEX MICROSCOPIC   No results found.   No diagnosis found.    MDM  Patient presenting with abdominal pain that resolved without pain medications.  He reports that the pain improved after he had an BM.  Labs and UA unremarkable.  Patient afebrile.  No vomiting in ED.  Therefore, feel patient can be discharged home.  Return precautions discussed.        Pascal Lux [place], PA-C 07/01/11 0041

## 2011-06-29 NOTE — Discharge Instructions (Signed)
Follow up with your Primary Care Physician listed above for further evaluation of your abdominal pain. Only use your pain medication for severe pain.  Abdominal Pain  Your exam might not show the exact reason you have abdominal pain. Since there are many different causes of abdominal pain, another checkup and more tests may be needed. It is very important to follow up for lasting (persistent) or worsening symptoms. A possible cause of abdominal pain in any person who still has his or her appendix is acute appendicitis. Appendicitis is often hard to diagnose. Normal blood tests, urine tests, ultrasound, and CT scans do not completely rule out early appendicitis or other causes of abdominal pain. Sometimes, only the changes that happen over time will allow appendicitis and other causes of abdominal pain to be determined. Other potential problems that may require surgery may also take time to become more apparent. Because of this, it is important that you follow all of the instructions below.   HOME CARE INSTRUCTIONS  Do not take laxatives unless directed by your caregiver. Rest as much as possible.  Do not eat solid food until your pain is gone: A diet of water, weak decaffeinated tea, broth or bouillon, gelatin, oral rehydration solutions (ORS), frozen ice pops, or ice chips may be helpful.  When pain is gone: Start a light diet (dry toast, crackers, applesauce, or white rice). Increase the diet slowly as long as it does not bother you. Eat no dairy products (including cheese and eggs) and no spicy, fatty, fried, or high-fiber foods.  Use no alcohol, caffeine, or cigarettes.  Take your regular medicines unless your caregiver told you not to.  Take any prescribed medicine as directed.   SEEK IMMEDIATE MEDICAL CARE IF:  The pain does not go away.  You have a fever >101 that persists You keep throwing up (vomiting) or cannot drink liquids.  The pain becomes localized (Pain in the right side could  possibly be appendicitis. In an adult, pain in the left lower portion of the abdomen could be colitis or diverticulitis). You pass bloody or black tarry stools.  You have shaking chills.  There is blood in your vomit or you see blood in your bowel movements.  Your bowel movements stop (become blocked) or you cannot pass gas.  You have bloody, frequent, or painful urination.  You have yellow discoloration in the skin or whites of the eyes.  Your stomach becomes bloated or bigger.  You have dizziness or fainting.  You have chest or back pain.

## 2011-06-29 NOTE — ED Notes (Signed)
Per EMS, pt from home with abd pain, N/V. Pt was released from [facility] Wednesday.

## 2011-06-29 NOTE — ED Notes (Signed)
Patient aware of need for urine specimen. Patient unable to void at this time. Patient given urinal. Encouraged to call for assistance if needed.   

## 2011-07-03 ENCOUNTER — Ambulatory Visit: Payer: Medicare Other | Admitting: Vascular Surgery

## 2011-07-03 NOTE — ED Provider Notes (Signed)
Medical screening examination/treatment/procedure(s) were performed by non-physician practitioner and as supervising physician I was immediately available for consultation/collaboration.   Loren Racer, MD 07/03/11 [phone]

## 2011-07-09 ENCOUNTER — Encounter: Payer: Self-pay | Admitting: Vascular Surgery

## 2011-07-10 ENCOUNTER — Ambulatory Visit (INDEPENDENT_AMBULATORY_CARE_PROVIDER_SITE_OTHER): Payer: Medicare Other | Admitting: Vascular Surgery

## 2011-07-10 ENCOUNTER — Encounter: Payer: Self-pay | Admitting: Vascular Surgery

## 2011-07-10 ENCOUNTER — Encounter: Payer: Self-pay | Admitting: *Deleted

## 2011-07-10 VITALS — BP 120/81 | HR 62 | Resp 20 | Ht 74.0 in | Wt 315.0 lb

## 2011-07-10 DIAGNOSIS — I723 Aneurysm of iliac artery: Secondary | ICD-10-CM

## 2011-07-10 NOTE — Progress Notes (Signed)
[facility] [place]  Established Iliac Artery Aneurysms  History of Present Illness  The patient is a 61 y.o. (12/21/1950) male who presents with chief complaint: follow up for bilateral iliac aneurysms.  Previous studies demonstrate an R CIA aneurysm 3.2 cm, L CIA aneurysm 3.6 cm.  An aortogram was completed to verify these findings as his obesity caused significant deterioration of quality of imaging.  The patient also had some bleeding in his right leg which has resolved, felt possibly related to his coumadin use.  He had IVCF placed at that point as his anticoagulation had to be held for a few weeks.  The patient does not currently have back or abdominal pain.  The patient is a former smoker.  Past Medical History  Diagnosis Date  . Hypertension   . Chronic pain   . Obesity   . DVT (deep venous thrombosis)   . Dysrhythmia     atrial fibrilation  . Shortness of breath   . Blood transfusion 05/07/2011     " no reaction "  . Arthritis     Past Surgical History  Procedure Date  . Knee surgery   . Esophagogastroduodenoscopy 05/08/2011    Procedure: ESOPHAGOGASTRODUODENOSCOPY (EGD);  Surgeon: Patrick D Hung, MD;  Location: [facility] ENDOSCOPY;  Service: Endoscopy;  Laterality: N/A;  . Colonoscopy 05/11/2011    Procedure: COLONOSCOPY;  Surgeon: Jyothi Mann, MD;  Location: [facility] ENDOSCOPY;  Service: Endoscopy;  Laterality: N/A;  . Colonoscopy 05/12/2011    Procedure: COLONOSCOPY;  Surgeon: Patrick D Hung, MD;  Location: [facility] ENDOSCOPY;  Service: Endoscopy;  Laterality: N/A;  may be cx abnormal ct    History   Social History  . Marital Status: Divorced    Spouse Name: N/A    Number of Children: N/A  . Years of Education: N/A   Occupational History  . Not on file.   Social History Main Topics  . Smoking status: Former Smoker -- 0.5 packs/day for 2 years  . Smokeless tobacco: Never Used  . Alcohol Use: Yes     rarely  . Drug Use: No  . Sexually Active: Yes   Other  Topics Concern  . Not on file   Social History Narrative  . No narrative on file    Family History  Problem Relation Age of Onset  . Cancer Mother     Current Outpatient Prescriptions on File Prior to Visit  Medication Sig Dispense Refill  . allopurinol (ZYLOPRIM) 100 MG tablet Take 100 mg by mouth daily.      . desonide (DESOWEN) 0.05 % cream Apply 1 application topically daily. Apply to rash on face      . digoxin (LANOXIN) 0.125 MG tablet Take 125 mcg by mouth daily. Hold if pulse less than 60.      . diltiazem (CARDIZEM CD) 360 MG 24 hr capsule Take 360 mg by mouth daily.      . fluocinonide ointment (LIDEX) 0.05 % Apply 1 application topically 2 (two) times daily. Apply to dry skin areas on body      . metoprolol (LOPRESSOR) 50 MG tablet Take 1 tablet (50 mg total) by mouth 2 (two) times daily.      . oxyCODONE-acetaminophen (PERCOCET) 10-325 MG per tablet Take 1 tablet by mouth every 6 (six) hours as needed. Pain.      . pravastatin (PRAVACHOL) 40 MG tablet Take 40 mg by mouth daily.      . triamcinolone ointment (KENALOG) 0.1 % Apply   1 application topically daily. Apply to skin rash areas on body      . warfarin (COUMADIN) 5 MG tablet Take 5 mg by mouth daily.      . TAZTIA XT 360 MG 24 hr capsule       . zolpidem (AMBIEN) 5 MG tablet         No Known Allergies  Review of Systems (Positive items checked otherwise negative)  General: [ ] Weight loss, [x] Weight gain, [ ]  Loss of appetite, [ ] Fever  Neurologic: [ ] Dizziness, [ ] Blackouts, [ ] Headaches, [ ] Seizure  Ear/Nose/Throat: [ ] Change in eyesight, [ ] Change in hearing, [ ] Nose bleeds, [ ] Sore throat  Vascular: [x] Pain in legs with walking, [ ] Pain in feet while lying flat, [ ] Non-healing ulcer, Stroke, [ ] "Mini stroke", [ ] Slurred speech, [ ] Temporary blindness, [ ] Blood clot in vein, [ ] Phlebitis  Pulmonary: [ ] Home oxygen, [ ] Productive cough, [ ] Bronchitis, [ ] Coughing up blood, [ ] Asthma,  [ ] Wheezing  Musculoskeletal: [ ] Arthritis, [x] Joint pain, [x] Muscle pain  Cardiac: [ ] Chest pain, [ ] Chest tightness/pressure, [ ] Shortness of breath when lying flat, [ ] Shortness of breath with exertion, [ ] Palpitations, [ ] Heart murmur, [ ] Arrythmia,  [ ] Atrial fibrillation  Hematologic: [ ] Bleeding problems, [x] Clotting disorder, [ ] Anemia  Psychiatric:  [ ] Depression, [ ] Anxiety, [ ] Attention deficit disorder  Gastrointestinal:  [ ] Black stool,[ ]  Blood in stool, [ ] Peptic ulcer disease, [ ] Reflux, [ ] Hiatal hernia, [ ] Trouble swallowing, [ ] Diarrhea, [ ] Constipation  Urinary:  [ ] Kidney disease, [ ] Burning with urination, [ ] Frequent urination, [ ] Difficulty urinating  Skin: [ ] Ulcers, [ ] Rashes     Physical Examination  Filed Vitals:   07/10/11 1040  BP: 120/81  Pulse: 62  Resp: 20  Height: 6' 2" (1.88 m)  Weight: 315 lb (142.883 kg)   Body mass index is 40.44 kg/(m^2).  General: A&O x 3, WDWN , morbidly obese  Pulmonary: Sym exp, good air movt, CTAB, no rales, rhonchi, & wheezing  Cardiac: RRR, Nl S1, S2, no Murmurs, rubs or gallops  Vascular: Vessel Right Left  Radial Palpable Palpable  Brachial Palpable Palpable  Carotid Palpable, without bruit Palpable, without bruit  Aorta Non-palpable due to obesity N/A  Femoral Palpable Palpable  Popliteal Non-palpable Non-palpable  PT Palpable Palpable  DP Palpable Palpable   Gastrointestinal: soft, NTND, -G/R, - HSM, - masses, - CVAT B, obese with large pannus  Musculoskeletal: M/S 5/5 throughout , Extremities without ischemic changes   Neurologic: Pain and light touch intact in extremities , Motor exam as listed above  Medical Decision Making  The patient is a 61 y.o. male who presents with: B CIA aneurysms   Based on this patient's exam and diagnostic studies, he needs L IIA embolization and EVAR.    Unfortunately, his anatomy is not compatible with just coverage of the  CIAAs so EVAR would be necessary  He already meets criteria for repair of the L CIA and the EVAR would address both CIA.  The R IIA may need embolization later in time also, but I would do that in staged fashion.  I think the traditional open repair in this patient would be very difficult   due to his morbid obesity and large size.  I emphasized the importance of maximal medical management including strict control of blood pressure, blood glucose, and lipid levels, antiplatelet agents, obtaining regular exercise, and cessation of smoking.    Tenatively, we will schedule him for a L IIA embolization on 18 JUL 13 and the EVAR on 23 JUL 13 with Dr. Dickson.  Thank you for allowing us to participate in this patient's care.  Eyla Tallon, MD [facility] [place] Office: [phone] Pager: [phone]  07/10/2011, 1:07 PM      

## 2011-07-13 ENCOUNTER — Other Ambulatory Visit: Payer: Self-pay | Admitting: *Deleted

## 2011-07-13 DIAGNOSIS — G4733 Obstructive sleep apnea (adult) (pediatric): Secondary | ICD-10-CM

## 2011-07-13 DIAGNOSIS — I714 Abdominal aortic aneurysm, without rupture, unspecified: Secondary | ICD-10-CM

## 2011-07-13 HISTORY — DX: Abdominal aortic aneurysm, without rupture, unspecified: I71.40

## 2011-07-13 HISTORY — DX: Obstructive sleep apnea (adult) (pediatric): G47.33

## 2011-07-13 HISTORY — DX: Abdominal aortic aneurysm, without rupture: I71.4

## 2011-07-15 ENCOUNTER — Encounter (HOSPITAL_COMMUNITY): Payer: Self-pay | Admitting: Pharmacy Technician

## 2011-07-29 ENCOUNTER — Encounter: Payer: Self-pay | Admitting: *Deleted

## 2011-07-29 MED ORDER — DEXTROSE 5 % IV SOLN
1.5000 g | INTRAVENOUS | Status: DC
  Filled 2011-07-29: qty 1.5

## 2011-07-30 ENCOUNTER — Other Ambulatory Visit (HOSPITAL_COMMUNITY): Payer: Medicare Other

## 2011-07-30 ENCOUNTER — Encounter (HOSPITAL_COMMUNITY)
Admission: RE | Disposition: A | Discharge status: Home / Self Care | Payer: Self-pay | Source: Ambulatory Visit | Attending: Vascular Surgery

## 2011-07-30 ENCOUNTER — Ambulatory Visit (HOSPITAL_COMMUNITY)
Admission: RE | Admit: 2011-07-30 | Discharge: 2011-07-30 | Disposition: A | Discharge status: Home / Self Care | Payer: Medicare Other | Source: Ambulatory Visit | Attending: Vascular Surgery | Admitting: Vascular Surgery

## 2011-07-30 DIAGNOSIS — M129 Arthropathy, unspecified: Secondary | ICD-10-CM | POA: Insufficient documentation

## 2011-07-30 DIAGNOSIS — I723 Aneurysm of iliac artery: Secondary | ICD-10-CM

## 2011-07-30 DIAGNOSIS — G8929 Other chronic pain: Secondary | ICD-10-CM | POA: Insufficient documentation

## 2011-07-30 DIAGNOSIS — Z86718 Personal history of other venous thrombosis and embolism: Secondary | ICD-10-CM | POA: Insufficient documentation

## 2011-07-30 DIAGNOSIS — I4891 Unspecified atrial fibrillation: Secondary | ICD-10-CM | POA: Insufficient documentation

## 2011-07-30 DIAGNOSIS — E669 Obesity, unspecified: Secondary | ICD-10-CM | POA: Insufficient documentation

## 2011-07-30 DIAGNOSIS — I10 Essential (primary) hypertension: Secondary | ICD-10-CM | POA: Insufficient documentation

## 2011-07-30 HISTORY — PX: EMBOLIZATION: SHX5507

## 2011-07-30 SURGERY — EMBOLIZATION
Anesthesia: LOCAL

## 2011-07-30 MED ORDER — OXYCODONE-ACETAMINOPHEN 5-325 MG PO TABS: 1.0000 | ORAL_TABLET | ORAL | Status: DC | PRN

## 2011-07-30 MED ORDER — SODIUM CHLORIDE 0.9 % IV SOLN
INTRAVENOUS | Status: DC
  Administered 2011-07-30: 1000 mL via INTRAVENOUS

## 2011-07-30 MED ORDER — ACETAMINOPHEN 325 MG PO TABS: 650.0000 mg | ORAL_TABLET | ORAL | Status: DC | PRN

## 2011-07-30 MED ORDER — SODIUM CHLORIDE 0.9 % IV SOLN: 1.0000 mL/kg/h | INTRAVENOUS | Status: DC

## 2011-07-30 MED ORDER — LIDOCAINE HCL (PF) 1 % IJ SOLN
INTRAMUSCULAR | Status: AC
  Filled 2011-07-30: qty 30

## 2011-07-30 MED ORDER — MIDAZOLAM HCL 2 MG/2ML IJ SOLN
INTRAMUSCULAR | Status: AC
  Filled 2011-07-30: qty 2

## 2011-07-30 MED ORDER — HEPARIN (PORCINE) IN NACL 2-0.9 UNIT/ML-% IJ SOLN
INTRAMUSCULAR | Status: AC
  Filled 2011-07-30: qty 1000

## 2011-07-30 MED ORDER — SODIUM CHLORIDE 0.9 % IV SOLN: INTRAVENOUS | Status: DC

## 2011-07-30 MED ORDER — FENTANYL CITRATE 0.05 MG/ML IJ SOLN
INTRAMUSCULAR | Status: AC
  Filled 2011-07-30: qty 2

## 2011-07-30 MED ORDER — MORPHINE SULFATE 4 MG/ML IJ SOLN: 2.0000 mg | INTRAMUSCULAR | Status: DC | PRN

## 2011-07-30 MED ORDER — ONDANSETRON HCL 4 MG/2ML IJ SOLN: 4.0000 mg | Freq: Four times a day (QID) | INTRAMUSCULAR | Status: DC | PRN

## 2011-07-30 NOTE — Interval H&P Note (Signed)
[facility] [place]  History and Physical Update  The patient was interviewed and re-examined.  The patient's previous History and Physical has been reviewed and is unchanged except for: interval resolution of bleeding of unknown etiology.  There is no change in the plan of care.  Leonides Sake, MD [facility] [place] Office: [phone] Pager: [phone]  07/30/2011, 7:30 AM

## 2011-07-30 NOTE — H&P (View-Only) (Signed)
[facility] [place]  Established Iliac Artery Aneurysms  History of Present Illness  The patient is a 61 y.o. (16-Apr-1950) male who presents with chief complaint: follow up for bilateral iliac aneurysms.  Previous studies demonstrate an R CIA aneurysm 3.2 cm, L CIA aneurysm 3.6 cm.  An aortogram was completed to verify these findings as his obesity caused significant deterioration of quality of imaging.  The patient also had some bleeding in his right leg which has resolved, felt possibly related to his coumadin use.  He had IVCF placed at that point as his anticoagulation had to be held for a few weeks.  The patient does not currently have back or abdominal pain.  The patient is a former smoker.  Past Medical History  Diagnosis Date  . Hypertension   . Chronic pain   . Obesity   . DVT (deep venous thrombosis)   . Dysrhythmia     atrial fibrilation  . Shortness of breath   . Blood transfusion 05/07/2011     " no reaction "  . Arthritis     Past Surgical History  Procedure Date  . Knee surgery   . Esophagogastroduodenoscopy 05/08/2011    Procedure: ESOPHAGOGASTRODUODENOSCOPY (EGD);  Surgeon: Theda Belfast, MD;  Location: [facility] ENDOSCOPY;  Service: Endoscopy;  Laterality: N/A;  . Colonoscopy 05/11/2011    Procedure: COLONOSCOPY;  Surgeon: Charna Elizabeth, MD;  Location: [facility] ENDOSCOPY;  Service: Endoscopy;  Laterality: N/A;  . Colonoscopy 05/12/2011    Procedure: COLONOSCOPY;  Surgeon: Theda Belfast, MD;  Location: [facility] ENDOSCOPY;  Service: Endoscopy;  Laterality: N/A;  may be cx abnormal ct    History   Social History  . Marital Status: Divorced    Spouse Name: N/A    Number of Children: N/A  . Years of Education: N/A   Occupational History  . Not on file.   Social History Main Topics  . Smoking status: Former Smoker -- 0.5 packs/day for 2 years  . Smokeless tobacco: Never Used  . Alcohol Use: Yes     rarely  . Drug Use: No  . Sexually Active: Yes   Other  Topics Concern  . Not on file   Social History Narrative  . No narrative on file    Family History  Problem Relation Age of Onset  . Cancer Mother     Current Outpatient Prescriptions on File Prior to Visit  Medication Sig Dispense Refill  . allopurinol (ZYLOPRIM) 100 MG tablet Take 100 mg by mouth daily.      [name] desonide (DESOWEN) 0.05 % cream Apply 1 application topically daily. Apply to rash on face      . digoxin (LANOXIN) 0.125 MG tablet Take 125 mcg by mouth daily. Hold if pulse less than 60.      . diltiazem (CARDIZEM CD) 360 MG 24 hr capsule Take 360 mg by mouth daily.      . fluocinonide ointment (LIDEX) 0.05 % Apply 1 application topically 2 (two) times daily. Apply to dry skin areas on body      . metoprolol (LOPRESSOR) 50 MG tablet Take 1 tablet (50 mg total) by mouth 2 (two) times daily.      [name] oxyCODONE-acetaminophen (PERCOCET) 10-325 MG per tablet Take 1 tablet by mouth every 6 (six) hours as needed. Pain.      . pravastatin (PRAVACHOL) 40 MG tablet Take 40 mg by mouth daily.      [name] triamcinolone ointment (KENALOG) 0.1 % Apply  1 application topically daily. Apply to skin rash areas on body      . warfarin (COUMADIN) 5 MG tablet Take 5 mg by mouth daily.      [name] TAZTIA XT 360 MG 24 hr capsule       . zolpidem (AMBIEN) 5 MG tablet         No Known Allergies  Review of Systems (Positive items checked otherwise negative)  General: [ ]  Weight loss, [x]  Weight gain, [ ]   Loss of appetite, [ ]  Fever  Neurologic: [ ]  Dizziness, [ ]  Blackouts, [ ]  Headaches, [ ]  Seizure  Ear/Nose/Throat: [ ]  Change in eyesight, [ ]  Change in hearing, [ ]  Nose bleeds, [ ]  Sore throat  Vascular: [x]  Pain in legs with walking, [ ]  Pain in feet while lying flat, [ ]  Non-healing ulcer, Stroke, [ ]  "Mini stroke", [ ]  Slurred speech, [ ]  Temporary blindness, [ ]  Blood clot in vein, [ ]  Phlebitis  Pulmonary: [ ]  Home oxygen, [ ]  Productive cough, [ ]  Bronchitis, [ ]  Coughing up blood, [ ]  Asthma,  [ ]  Wheezing  Musculoskeletal: [ ]  Arthritis, [x]  Joint pain, [x]  Muscle pain  Cardiac: [ ]  Chest pain, [ ]  Chest tightness/pressure, [ ]  Shortness of breath when lying flat, [ ]  Shortness of breath with exertion, [ ]  Palpitations, [ ]  Heart murmur, [ ]  Arrythmia,  [ ]  Atrial fibrillation  Hematologic: [ ]  Bleeding problems, [x]  Clotting disorder, [ ]  Anemia  Psychiatric:  [ ]  Depression, [ ]  Anxiety, [ ]  Attention deficit disorder  Gastrointestinal:  [ ]  Black stool,[ ]   Blood in stool, [ ]  Peptic ulcer disease, [ ]  Reflux, [ ]  Hiatal hernia, [ ]  Trouble swallowing, [ ]  Diarrhea, [ ]  Constipation  Urinary:  [ ]  Kidney disease, [ ]  Burning with urination, [ ]  Frequent urination, [ ]  Difficulty urinating  Skin: [ ]  Ulcers, [ ]  Rashes     Physical Examination  Filed Vitals:   07/10/11 1040  BP: 120/81  Pulse: 62  Resp: 20  Height: 6\' 2"  (1.88 m)  Weight: 315 lb (142.883 kg)   Body mass index is 40.44 kg/(m^2).  General: A&O x 3, WDWN , morbidly obese  Pulmonary: Sym exp, good air movt, CTAB, no rales, rhonchi, & wheezing  Cardiac: RRR, Nl S1, S2, no Murmurs, rubs or gallops  Vascular: Vessel Right Left  Radial Palpable Palpable  Brachial Palpable Palpable  Carotid Palpable, without bruit Palpable, without bruit  Aorta Non-palpable due to obesity N/A  Femoral Palpable Palpable  Popliteal Non-palpable Non-palpable  PT Palpable Palpable  DP Palpable Palpable   Gastrointestinal: soft, NTND, -G/R, - HSM, - masses, - CVAT B, obese with large pannus  Musculoskeletal: M/S 5/5 throughout , Extremities without ischemic changes   Neurologic: Pain and light touch intact in extremities , Motor exam as listed above  Medical Decision Making  The patient is a 61 y.o. male who presents with: B CIA aneurysms   Based on this patient's exam and diagnostic studies, he needs L IIA embolization and EVAR.    Unfortunately, his anatomy is not compatible with just coverage of the  CIAAs so EVAR would be necessary  He already meets criteria for repair of the L CIA and the EVAR would address both CIA.  The R IIA may need embolization later in time also, but I would do that in staged fashion.  I think the traditional open repair in this patient would be very difficult  due to his morbid obesity and large size.  I emphasized the importance of maximal medical management including strict control of blood pressure, blood glucose, and lipid levels, antiplatelet agents, obtaining regular exercise, and cessation of smoking.    Tenatively, we will schedule him for a L IIA embolization on 18 JUL 13 and the EVAR on 23 JUL 13 with Dr. Edilia Bo.  Thank you for allowing [place] to participate in this patient's care.  Leonides Sake, MD [facility] [place] Office: [phone] Pager: [phone]  07/10/2011, 1:07 PM

## 2011-07-30 NOTE — Op Note (Signed)
OPERATIVE NOTE   PROCEDURE: 1.  Right common femoral artery cannulation under ultrasound guidance 2.  Aortogram 3.  Left internal iliac artery embolization x 5 (Azur Framing 10 mm x 26 cm, Azur Hydrogel 10 mm x 15 cm, 2 8 mm x 20 cm, 6 mm x 20 cm)  PRE-OPERATIVE DIAGNOSIS: Bilateral internal iliac artery aneurysms  POST-OPERATIVE DIAGNOSIS: same as above   SURGEON: Leonides Sake, MD  ANESTHESIA: conscious sedation  ESTIMATED BLOOD LOSS: 50 cc  CONTRAST: 75 cc  FINDING(S):  Aorta: patent Renal arteries: bilaterally patent Common iliac arteries: bilaterally patent and aneurysmal External iliac arteries: bilaterally patent and non-aneurysmal Internal iliac arteries: bilaterally patent and non-aneurysmal, left IIA occluded after embolization  SPECIMEN(S):  none  INDICATIONS:   [name] is a 61 y.o. male who presents with bilateral large internal iliac arteries.  His anatomy is compatible with an EVAR repair of both internal iliac artery but requires adjunct embolization of the left internal iliac artery aneurysm first.  The right internal iliac artery may require intervention at some time, but it was felt his right common iliac artery could be excluded with a bell-bottom iliac limb.  The patient presents for: left internal iliac artery embolization.  I discussed with the patient the nature of angiographic procedures, especially the limited patencies of any endovascular intervention.  The patient is aware of that the risks of an angiographic procedure include but are not limited to: bleeding, infection, access site complications, renal failure, embolization, rupture of vessel, dissection, possible need for emergent surgical intervention, possible need for surgical procedures to treat the patient's pathology, and stroke and death.  Specifically, the patient has been counseled on the risks of bowel ischemia and pelvic ischemia.  The patient is aware of the risks and agrees to  proceed.  DESCRIPTION: After full informed consent was obtained from the patient, the patient was brought back to the angiography suite.  The patient was placed supine upon the angiography table and connected to monitoring equipment.  The patient was then given conscious sedation, the amounts of which are documented in the patient's chart.  The patient was prepped and drape in the standard fashion for an angiographic procedure.  At this point, attention was turned to the right groin.  Under ultrasound guidance, the right common femoral artery was cannulated with a micropuncture needle.  The microwire was advanced into the iliac arterial system.  The needle was exchanged for a microsheath, which was loaded into the common femoral artery over the wire.  The microwire was exchanged for a [facility] wire which was advanced into the aorta.  The microsheath was then exchanged for a 5-Fr sheath which was loaded into the common femoral artery.  Aortogram needed to be repeated as the level of the aortic bifurcation could not be identified on previous imaging studies due his obesity.  The Omniflush catheter was then loaded over the wire up to the level of L1.  The catheter was connected to the power injector circuit.  After de-airring and de-clotting the circuit, a power injector aortogram was completed.  The [facility] wire was replaced in the catheter, and using the [facility] and Omniflush catheter, the left common iliac artery was selected.  The catheter would not advance so it was exchanged for an endhole catheter.  I also exchanged the [facility] wire for a Glidewire.  Using these two, I advanced these two into the internal iliac artery.  The wire was exchanged for an Amplatz wire.  The right  femoral sheath was exchanged for a 6-Fr Destination sheath, which was advanced into the left internal iliac artery orifice.  The dilator was removed and a 5-Fr endhole catheter was loaded over the wire.  The wire was removed.  I did a hand  injection via the sheath to imaged the internal iliac artery, which was ~9.5 mm in diameter at the largest.  I selected a 10 mm x 26 cm Azur framing coil and deployed it in the internal iliac artery, avoiding the first order bifurcation.  I then packed 10 mm x 15 cm Azur, 2 8 mm x 20 cm Azur, and 1 6 mm x 20 cm Azur hydrogel coils into this internal iliac artery, using the framing coil as a scaffolding.  After ~20 minutes, hand injections demonstrated complete embolization of the left internal iliac artery.  The patient remained asymptomatic at this point.  At this point all wires and catheter were removed.  The sheath was pulled back into the right external iliac artery, with the plan to pull the sheath in the holding area.  COMPLICATIONS: none  CONDITION: stable   Leonides Sake, MD [facility] [place] Office: [phone] Pager: [phone]  07/30/2011, 9:47 AM

## 2011-07-31 ENCOUNTER — Other Ambulatory Visit: Payer: Self-pay | Admitting: *Deleted

## 2011-08-05 NOTE — Pre-Procedure Instructions (Signed)
20 [name]  08/05/2011   Your procedure is scheduled on:  08/11/11  Report to [name] at 530 AM.  Call this number if you have problems the morning of surgery: [phone]   Remember:   Do not eat foodor drink:After Midnight.  .  Take these medicines the morning of surgery with A SIP OF WATER: allopurinol, percocet, digoxin, metoprolol, STOP warfarin now or as instructed by dr   Drucilla Schmidt not wear jewelry, make-up or nail polish.  Do not wear lotions, powders, or perfumes. You may wear deodorant.  Do not shave 48 hours prior to surgery. Men may shave face and neck.  Do not bring valuables to the hospital.  Contacts, dentures or bridgework may not be worn into surgery.  Leave suitcase in the car. After surgery it may be brought to your room.  For patients admitted to the hospital, checkout time is 11:00 AM the day of discharge.   Patients discharged the day of surgery will not be allowed to drive home.  Name and phone number of your driver:   Special Instructions: CHG Shower Use Special Wash: 1/2 bottle night before surgery and 1/2 bottle morning of surgery.   Please read over the following fact sheets that you were given: Pain Booklet, Coughing and Deep Breathing, Blood Transfusion Information, MRSA Information and Surgical Site Infection Prevention

## 2011-08-06 ENCOUNTER — Other Ambulatory Visit (HOSPITAL_COMMUNITY): Payer: Medicare Other

## 2011-08-06 ENCOUNTER — Encounter (HOSPITAL_COMMUNITY): Payer: Self-pay

## 2011-08-06 ENCOUNTER — Encounter (HOSPITAL_COMMUNITY)
Admission: RE | Admit: 2011-08-06 | Discharge: 2011-08-06 | Disposition: A | Discharge status: Home / Self Care | Payer: Medicare Other | Source: Ambulatory Visit | Attending: Vascular Surgery | Admitting: Vascular Surgery

## 2011-08-06 HISTORY — DX: Hyperlipidemia, unspecified: E78.5

## 2011-08-06 LAB — CBC
Hemoglobin: 13.8 g/dL (ref 13.0–17.0)
MCH: 28.3 pg (ref 26.0–34.0)
MCHC: 33.7 g/dL (ref 30.0–36.0)
RDW: 17.9 % — ABNORMAL HIGH (ref 11.5–15.5)

## 2011-08-06 LAB — APTT: aPTT: 39 seconds — ABNORMAL HIGH (ref 24–37)

## 2011-08-06 LAB — COMPREHENSIVE METABOLIC PANEL
ALT: 9 U/L (ref 0–53)
Calcium: 9.6 mg/dL (ref 8.4–10.5)
GFR calc Af Amer: >90 mL/min (ref >90)
Glucose, Bld: 104 mg/dL — ABNORMAL HIGH (ref 70–99)
Sodium: 135 mEq/L (ref 135–145)
Total Protein: 8.8 g/dL — ABNORMAL HIGH (ref 6.0–8.3)

## 2011-08-06 LAB — URINALYSIS, ROUTINE W REFLEX MICROSCOPIC
Nitrite: NEGATIVE
Specific Gravity, Urine: 1.022 (ref 1.005–1.030)
pH: 5.5 (ref 5.0–8.0)

## 2011-08-06 LAB — SURGICAL PCR SCREEN: Staphylococcus aureus: NEGATIVE

## 2011-08-06 LAB — BLOOD GAS, ARTERIAL
Bicarbonate: 20.1 mEq/L (ref 20.0–24.0)
Drawn by: 206361
FIO2: 0.21 %
O2 Saturation: 97.3 %
Patient temperature: 98.6

## 2011-08-06 LAB — PROTIME-INR
INR: 1.8 — ABNORMAL HIGH (ref 0.00–1.49)
Prothrombin Time: 21.2 seconds — ABNORMAL HIGH (ref 11.6–15.2)

## 2011-08-06 NOTE — Progress Notes (Signed)
Requested notes and test results from [facility].  Forwarded abnormal labs to [facility].

## 2011-08-06 NOTE — Consult Note (Signed)
Anesthesia Chart Review:  Patient is a 61 year old male scheduled for EVAR for iliac artery aneurysm repair by Dr. Imogene Burn on 08/11/11.  History includes obesity with BMI 39, HTN, HLD, post-operative PE/DVT '08, paroxysmal afib s/p DCCV 04/2010, chronic pain, OSA, psoriasis, former smoker,  Acute blood loss anemia with supra- therapeutic INR of 10 and right iliopsoas hematoma in April 2013 s/p transfusion.  IVC filter placed 04/2011.  He is s/p left iliac artery embolization 07/30/11.  Gastroenterologist is Dr. Elnoria Howard.  Cardiologist is Dr. Herbie [facility]).  Records are pending.    Currently, his last EKG from 05/07/11 showed rapid afib.  If he hasn't had an EKG since, then I would repeat it on the day of surgery for a baseline.  CXR on 05/07/11 showed no evidence of active pulmonary disease.  Labs noted.  Repeat PT/PTT on the day of surgery.  I'll follow-up once I receive records from [facility].  Shonna Chock, PA-C 08/06/11 1452  Addendum: 08/10/11 1030 Just received records from [facility].  He was last seen on 05/27/11.  His EKG then showed NSR.    Echo on 02/28/10 showed moderate LVH, EF > 55%, mild MR, mild bowing of anterior MV leaflet without definitive prolapse, mild TR, aortic root sclerosis/calcification.  Stress test on 02/28/10 showed normal myocardial perfusion.   Anticipate he can proceed if his follow-up labs are reasonable and no significant change in his status.

## 2011-08-10 MED ORDER — SODIUM CHLORIDE 0.9 % IV SOLN: INTRAVENOUS | Status: DC

## 2011-08-10 MED ORDER — DEXTROSE 5 % IV SOLN
1.5000 g | INTRAVENOUS | Status: AC
  Administered 2011-08-11: 1.5 g via INTRAVENOUS
  Filled 2011-08-10: qty 1.5

## 2011-08-11 ENCOUNTER — Encounter (HOSPITAL_COMMUNITY)
Admission: RE | Disposition: A | Discharge status: Home / Self Care | Payer: Self-pay | Source: Ambulatory Visit | Attending: Vascular Surgery

## 2011-08-11 ENCOUNTER — Inpatient Hospital Stay (HOSPITAL_COMMUNITY): Payer: Medicare Other

## 2011-08-11 ENCOUNTER — Ambulatory Visit (HOSPITAL_COMMUNITY): Payer: Medicare Other | Admitting: Vascular Surgery

## 2011-08-11 ENCOUNTER — Encounter (HOSPITAL_COMMUNITY): Payer: Self-pay | Admitting: Vascular Surgery

## 2011-08-11 ENCOUNTER — Encounter (HOSPITAL_COMMUNITY): Payer: Self-pay | Admitting: *Deleted

## 2011-08-11 ENCOUNTER — Inpatient Hospital Stay (HOSPITAL_COMMUNITY)
Admission: RE | Admit: 2011-08-11 | Discharge: 2011-08-12 | DRG: 238 | Disposition: A | Discharge status: Home / Self Care | Payer: Medicare Other | Source: Ambulatory Visit | Attending: Vascular Surgery | Admitting: Vascular Surgery

## 2011-08-11 ENCOUNTER — Other Ambulatory Visit: Payer: Self-pay | Admitting: Thoracic Diseases

## 2011-08-11 DIAGNOSIS — E669 Obesity, unspecified: Secondary | ICD-10-CM | POA: Diagnosis present

## 2011-08-11 DIAGNOSIS — Z87891 Personal history of nicotine dependence: Secondary | ICD-10-CM

## 2011-08-11 DIAGNOSIS — I723 Aneurysm of iliac artery: Secondary | ICD-10-CM | POA: Diagnosis present

## 2011-08-11 DIAGNOSIS — I714 Abdominal aortic aneurysm, without rupture, unspecified: Principal | ICD-10-CM | POA: Diagnosis present

## 2011-08-11 DIAGNOSIS — Z86718 Personal history of other venous thrombosis and embolism: Secondary | ICD-10-CM

## 2011-08-11 DIAGNOSIS — G8929 Other chronic pain: Secondary | ICD-10-CM | POA: Diagnosis present

## 2011-08-11 DIAGNOSIS — Z6839 Body mass index (BMI) 39.0-39.9, adult: Secondary | ICD-10-CM

## 2011-08-11 HISTORY — PX: OTHER SURGICAL HISTORY: SHX169

## 2011-08-11 LAB — BASIC METABOLIC PANEL
BUN: 22 mg/dL (ref 6–23)
CO2: 22 mEq/L (ref 19–32)
Calcium: 8.9 mg/dL (ref 8.4–10.5)
Glucose, Bld: 113 mg/dL — ABNORMAL HIGH (ref 70–99)
Sodium: 137 mEq/L (ref 135–145)

## 2011-08-11 LAB — CBC
HCT: 37.5 % — ABNORMAL LOW (ref 39.0–52.0)
Hemoglobin: 12.1 g/dL — ABNORMAL LOW (ref 13.0–17.0)
MCH: 27.5 pg (ref 26.0–34.0)
MCV: 85.2 fL (ref 78.0–100.0)
RBC: 4.4 MIL/uL (ref 4.22–5.81)

## 2011-08-11 LAB — PROTIME-INR
INR: 1.16 (ref 0.00–1.49)
INR: 1.18 (ref 0.00–1.49)
Prothrombin Time: 15 seconds (ref 11.6–15.2)

## 2011-08-11 LAB — TYPE AND SCREEN
ABO/RH(D): A POS
Antibody Screen: NEGATIVE

## 2011-08-11 LAB — APTT: aPTT: 30 seconds (ref 24–37)

## 2011-08-11 SURGERY — INSERTION, ENDOVASCULAR STENT GRAFT, AORTA, ABDOMINAL
Anesthesia: General | Wound class: Clean

## 2011-08-11 MED ORDER — WARFARIN SODIUM 5 MG PO TABS
5.0000 mg | ORAL_TABLET | Freq: Every day | ORAL | Status: DC
  Filled 2011-08-11: qty 1

## 2011-08-11 MED ORDER — DESONIDE 0.05 % EX CREA: 1.0000 "application " | TOPICAL_CREAM | Freq: Every day | CUTANEOUS | Status: DC

## 2011-08-11 MED ORDER — DOPAMINE-DEXTROSE 3.2-5 MG/ML-% IV SOLN: 3.0000 ug/kg/min | INTRAVENOUS | Status: DC

## 2011-08-11 MED ORDER — HYDROMORPHONE HCL PF 1 MG/ML IJ SOLN
0.2500 mg | INTRAMUSCULAR | Status: DC | PRN
  Administered 2011-08-11: 0.5 mg via INTRAVENOUS

## 2011-08-11 MED ORDER — NEOSTIGMINE METHYLSULFATE 1 MG/ML IJ SOLN
INTRAMUSCULAR | Status: DC | PRN
  Administered 2011-08-11: 3 mg via INTRAVENOUS

## 2011-08-11 MED ORDER — LABETALOL HCL 5 MG/ML IV SOLN: 10.0000 mg | INTRAVENOUS | Status: DC | PRN

## 2011-08-11 MED ORDER — METOPROLOL TARTRATE 1 MG/ML IV SOLN: 2.0000 mg | INTRAVENOUS | Status: DC | PRN

## 2011-08-11 MED ORDER — TRIAMCINOLONE ACETONIDE 0.1 % EX OINT
1.0000 "application " | TOPICAL_OINTMENT | Freq: Every day | CUTANEOUS | Status: DC
  Filled 2011-08-11: qty 15

## 2011-08-11 MED ORDER — ONDANSETRON HCL 4 MG/2ML IJ SOLN: 4.0000 mg | Freq: Four times a day (QID) | INTRAMUSCULAR | Status: DC | PRN

## 2011-08-11 MED ORDER — LIDOCAINE HCL (CARDIAC) 20 MG/ML IV SOLN
INTRAVENOUS | Status: DC | PRN
  Administered 2011-08-11: 100 mg via INTRAVENOUS

## 2011-08-11 MED ORDER — IOHEXOL 300 MG/ML  SOLN
INTRAMUSCULAR | Status: AC
  Filled 2011-08-11: qty 1

## 2011-08-11 MED ORDER — ACETAMINOPHEN 325 MG PO TABS: 325.0000 mg | ORAL_TABLET | ORAL | Status: DC | PRN

## 2011-08-11 MED ORDER — SUCCINYLCHOLINE CHLORIDE 20 MG/ML IJ SOLN
INTRAMUSCULAR | Status: DC | PRN
  Administered 2011-08-11: 120 mg via INTRAVENOUS

## 2011-08-11 MED ORDER — HYDROMORPHONE HCL PF 1 MG/ML IJ SOLN
INTRAMUSCULAR | Status: AC
  Filled 2011-08-11: qty 1

## 2011-08-11 MED ORDER — GLYCOPYRROLATE 0.2 MG/ML IJ SOLN
INTRAMUSCULAR | Status: DC | PRN
  Administered 2011-08-11: 0.4 mg via INTRAVENOUS

## 2011-08-11 MED ORDER — FENTANYL CITRATE 0.05 MG/ML IJ SOLN: 50.0000 ug | INTRAMUSCULAR | Status: DC | PRN

## 2011-08-11 MED ORDER — MAGNESIUM SULFATE 40 MG/ML IJ SOLN
2.0000 g | Freq: Once | INTRAMUSCULAR | Status: AC | PRN
  Filled 2011-08-11: qty 50

## 2011-08-11 MED ORDER — PANTOPRAZOLE SODIUM 40 MG PO TBEC: 40.0000 mg | DELAYED_RELEASE_TABLET | Freq: Every day | ORAL | Status: DC

## 2011-08-11 MED ORDER — BISACODYL 10 MG RE SUPP: 10.0000 mg | Freq: Every day | RECTAL | Status: DC | PRN

## 2011-08-11 MED ORDER — ZOLPIDEM TARTRATE 5 MG PO TABS
5.0000 mg | ORAL_TABLET | Freq: Every evening | ORAL | Status: DC | PRN
  Administered 2011-08-11: 5 mg via ORAL
  Filled 2011-08-11: qty 1

## 2011-08-11 MED ORDER — WARFARIN - PHYSICIAN DOSING INPATIENT: Freq: Every day | Status: DC

## 2011-08-11 MED ORDER — ALUM & MAG HYDROXIDE-SIMETH 200-200-20 MG/5ML PO SUSP: 15.0000 mL | ORAL | Status: DC | PRN

## 2011-08-11 MED ORDER — PROPOFOL 10 MG/ML IV EMUL
INTRAVENOUS | Status: DC | PRN
  Administered 2011-08-11: 200 mg via INTRAVENOUS

## 2011-08-11 MED ORDER — ACETAMINOPHEN 650 MG RE SUPP: 325.0000 mg | RECTAL | Status: DC | PRN

## 2011-08-11 MED ORDER — OXYCODONE-ACETAMINOPHEN 5-325 MG PO TABS
1.0000 | ORAL_TABLET | Freq: Four times a day (QID) | ORAL | Status: DC | PRN
  Administered 2011-08-11 – 2011-08-12 (×2): 1 via ORAL
  Filled 2011-08-11 (×2): qty 1

## 2011-08-11 MED ORDER — DOCUSATE SODIUM 100 MG PO CAPS
100.0000 mg | ORAL_CAPSULE | Freq: Every day | ORAL | Status: DC
  Administered 2011-08-12: 100 mg via ORAL
  Filled 2011-08-11: qty 1

## 2011-08-11 MED ORDER — MORPHINE SULFATE 2 MG/ML IJ SOLN
2.0000 mg | INTRAMUSCULAR | Status: DC | PRN
  Administered 2011-08-11: 2 mg via INTRAVENOUS

## 2011-08-11 MED ORDER — GUAIFENESIN-DM 100-10 MG/5ML PO SYRP: 15.0000 mL | ORAL_SOLUTION | ORAL | Status: DC | PRN

## 2011-08-11 MED ORDER — SODIUM CHLORIDE 0.9 % IV SOLN: 500.0000 mL | Freq: Once | INTRAVENOUS | Status: AC | PRN

## 2011-08-11 MED ORDER — DIGOXIN 125 MCG PO TABS
125.0000 ug | ORAL_TABLET | Freq: Every day | ORAL | Status: DC
  Filled 2011-08-11: qty 1

## 2011-08-11 MED ORDER — SIMVASTATIN 20 MG PO TABS
20.0000 mg | ORAL_TABLET | Freq: Every day | ORAL | Status: DC
  Administered 2011-08-11: 20 mg via ORAL
  Filled 2011-08-11 (×2): qty 1

## 2011-08-11 MED ORDER — LACTATED RINGERS IV SOLN
INTRAVENOUS | Status: DC | PRN
  Administered 2011-08-11 (×3): via INTRAVENOUS

## 2011-08-11 MED ORDER — MIDAZOLAM HCL 2 MG/2ML IJ SOLN: 1.0000 mg | INTRAMUSCULAR | Status: DC | PRN

## 2011-08-11 MED ORDER — PHENOL 1.4 % MT LIQD: 1.0000 | OROMUCOSAL | Status: DC | PRN

## 2011-08-11 MED ORDER — FLUOCINONIDE 0.05 % EX OINT
1.0000 "application " | TOPICAL_OINTMENT | Freq: Two times a day (BID) | CUTANEOUS | Status: DC
  Filled 2011-08-11: qty 15

## 2011-08-11 MED ORDER — MIDAZOLAM HCL 5 MG/5ML IJ SOLN
INTRAMUSCULAR | Status: DC | PRN
  Administered 2011-08-11: 2 mg via INTRAVENOUS

## 2011-08-11 MED ORDER — DILTIAZEM HCL ER BEADS 240 MG PO CP24
360.0000 mg | ORAL_CAPSULE | Freq: Every day | ORAL | Status: DC
  Administered 2011-08-12: 360 mg via ORAL
  Filled 2011-08-11: qty 1

## 2011-08-11 MED ORDER — ALLOPURINOL 100 MG PO TABS
100.0000 mg | ORAL_TABLET | Freq: Every day | ORAL | Status: DC
  Administered 2011-08-12: 100 mg via ORAL
  Filled 2011-08-11: qty 1

## 2011-08-11 MED ORDER — IODIXANOL 320 MG/ML IV SOLN
INTRAVENOUS | Status: DC | PRN
  Administered 2011-08-11: 150 mL via INTRA_ARTERIAL

## 2011-08-11 MED ORDER — FENTANYL CITRATE 0.05 MG/ML IJ SOLN
INTRAMUSCULAR | Status: DC | PRN
  Administered 2011-08-11: 50 ug via INTRAVENOUS
  Administered 2011-08-11: 75 ug via INTRAVENOUS
  Administered 2011-08-11: 100 ug via INTRAVENOUS
  Administered 2011-08-11: 25 ug via INTRAVENOUS

## 2011-08-11 MED ORDER — ONDANSETRON 8 MG PO TBDP
8.0000 mg | ORAL_TABLET | Freq: Three times a day (TID) | ORAL | Status: DC | PRN
  Filled 2011-08-11: qty 1

## 2011-08-11 MED ORDER — DEXTROSE 5 % IV SOLN
1.5000 g | Freq: Two times a day (BID) | INTRAVENOUS | Status: AC
  Administered 2011-08-11 – 2011-08-12 (×2): 1.5 g via INTRAVENOUS
  Filled 2011-08-11 (×2): qty 1.5

## 2011-08-11 MED ORDER — OXYCODONE-ACETAMINOPHEN 10-325 MG PO TABS: 1.0000 | ORAL_TABLET | Freq: Four times a day (QID) | ORAL | Status: DC | PRN

## 2011-08-11 MED ORDER — POTASSIUM CHLORIDE CRYS ER 20 MEQ PO TBCR: 20.0000 meq | EXTENDED_RELEASE_TABLET | Freq: Every day | ORAL | Status: DC | PRN

## 2011-08-11 MED ORDER — ONDANSETRON HCL 4 MG/2ML IJ SOLN
INTRAMUSCULAR | Status: DC | PRN
  Administered 2011-08-11: 4 mg via INTRAVENOUS

## 2011-08-11 MED ORDER — PROMETHAZINE HCL 25 MG RE SUPP: 25.0000 mg | Freq: Once | RECTAL | Status: DC | PRN

## 2011-08-11 MED ORDER — METOPROLOL TARTRATE 50 MG PO TABS
50.0000 mg | ORAL_TABLET | Freq: Two times a day (BID) | ORAL | Status: DC
  Administered 2011-08-11: 50 mg via ORAL
  Filled 2011-08-11 (×3): qty 1

## 2011-08-11 MED ORDER — ROCURONIUM BROMIDE 100 MG/10ML IV SOLN
INTRAVENOUS | Status: DC | PRN
  Administered 2011-08-11: 50 mg via INTRAVENOUS

## 2011-08-11 MED ORDER — SODIUM CHLORIDE 0.9 % IR SOLN
Status: DC | PRN
  Administered 2011-08-11: 09:00:00

## 2011-08-11 MED ORDER — MORPHINE SULFATE 2 MG/ML IJ SOLN
INTRAMUSCULAR | Status: AC
  Filled 2011-08-11: qty 1

## 2011-08-11 MED ORDER — MORPHINE SULFATE 4 MG/ML IJ SOLN
INTRAMUSCULAR | Status: AC
  Filled 2011-08-11: qty 1

## 2011-08-11 MED ORDER — POTASSIUM CHLORIDE IN NACL 20-0.9 MEQ/L-% IV SOLN
INTRAVENOUS | Status: DC
  Administered 2011-08-11: 15:00:00 via INTRAVENOUS
  Filled 2011-08-11 (×5): qty 1000

## 2011-08-11 MED ORDER — HYDRALAZINE HCL 20 MG/ML IJ SOLN: 10.0000 mg | INTRAMUSCULAR | Status: DC | PRN

## 2011-08-11 MED ORDER — OXYCODONE HCL 5 MG PO TABS: 5.0000 mg | ORAL_TABLET | Freq: Four times a day (QID) | ORAL | Status: DC | PRN

## 2011-08-11 SURGICAL SUPPLY — 82 items
BAG BANDED W/RUBBER/TAPE 36X54 (MISCELLANEOUS) ×2 IMPLANT
BAG DECANTER FOR FLEXI CONT (MISCELLANEOUS) IMPLANT
BAG EQP BAND 135X91 W/RBR TAPE (MISCELLANEOUS) ×1
BAG SNAP BAND KOVER 36X36 (MISCELLANEOUS) ×2 IMPLANT
BALLN CODA OCL 2-9.0-35-120-3 (BALLOONS)
BALLOON COD OCL 2-9.0-35-120-3 (BALLOONS) IMPLANT
CANISTER SUCTION 2500CC (MISCELLANEOUS) ×2 IMPLANT
CATH BEACON 5.038 65CM KMP-01 (CATHETERS) ×2 IMPLANT
CATH OMNI FLUSH .035X70CM (CATHETERS) ×2 IMPLANT
CATH STRAIGHT 5FR 65CM (CATHETERS) ×2 IMPLANT
CLIP TI MEDIUM 24 (CLIP) IMPLANT
CLIP TI WIDE RED SMALL 24 (CLIP) IMPLANT
COVER MAYO STAND STRL (DRAPES) ×2 IMPLANT
COVER PROBE W GEL 5X96 (DRAPES) ×2 IMPLANT
COVER SURGICAL LIGHT HANDLE (MISCELLANEOUS) ×2 IMPLANT
DERMABOND ADVANCED (GAUZE/BANDAGES/DRESSINGS) ×3
DERMABOND ADVANCED .7 DNX12 (GAUZE/BANDAGES/DRESSINGS) ×3 IMPLANT
DEVICE CLOSURE PERCLS PRGLD 6F (VASCULAR PRODUCTS) ×4 IMPLANT
DRAIN CHANNEL 10F 3/8 F FF (DRAIN) IMPLANT
DRAIN CHANNEL 10M FLAT 3/4 FLT (DRAIN) IMPLANT
DRAPE TABLE COVER HEAVY DUTY (DRAPES) ×2 IMPLANT
DRESSING OPSITE X SMALL 2X3 (GAUZE/BANDAGES/DRESSINGS) ×4 IMPLANT
DRYSEAL FLEXSHEATH 14FR 33CM (SHEATH) ×1
DRYSEAL FLEXSHEATH 18FR 33CM (SHEATH) ×1
ELECT CAUTERY BLADE 6.4 (BLADE) ×2 IMPLANT
ELECT REM PT RETURN 9FT ADLT (ELECTROSURGICAL) ×4
ELECTRODE REM PT RTRN 9FT ADLT (ELECTROSURGICAL) ×2 IMPLANT
EVACUATOR 3/16  PVC DRAIN (DRAIN)
EVACUATOR 3/16 PVC DRAIN (DRAIN) IMPLANT
EVACUATOR SILICONE 100CC (DRAIN) IMPLANT
EXCLUDER TRUNK (Endovascular Graft) ×2 IMPLANT
GLOVE BIO SURGEON STRL SZ 6.5 (GLOVE) ×2 IMPLANT
GLOVE BIO SURGEON STRL SZ7 (GLOVE) ×2 IMPLANT
GLOVE BIO SURGEON STRL SZ7.5 (GLOVE) ×2 IMPLANT
GLOVE BIOGEL PI IND STRL 6.5 (GLOVE) ×2 IMPLANT
GLOVE BIOGEL PI IND STRL 7.0 (GLOVE) ×1 IMPLANT
GLOVE BIOGEL PI IND STRL 7.5 (GLOVE) ×2 IMPLANT
GLOVE BIOGEL PI INDICATOR 6.5 (GLOVE) ×2
GLOVE BIOGEL PI INDICATOR 7.0 (GLOVE) ×1
GLOVE BIOGEL PI INDICATOR 7.5 (GLOVE) ×2
GLOVE SURG SS PI 7.0 STRL IVOR (GLOVE) ×6 IMPLANT
GOWN STRL NON-REIN LRG LVL3 (GOWN DISPOSABLE) ×6 IMPLANT
GOWN STRL REIN XL XLG (GOWN DISPOSABLE) ×6 IMPLANT
GRAFT BALLN CATH 65CM (STENTS) ×1 IMPLANT
GRAFT EXCLUDER ILIAC (Endovascular Graft) ×2 IMPLANT
GUIDEWIRE ANGLED .035X150CM (WIRE) ×2 IMPLANT
KIT BASIN OR (CUSTOM PROCEDURE TRAY) ×2 IMPLANT
KIT LAP CBDE (SET/KITS/TRAYS/PACK) ×2 IMPLANT
KIT ROOM TURNOVER OR (KITS) ×2 IMPLANT
LEG CONTRALATERAL 23X12 (Endovascular Graft) ×2 IMPLANT
NAMIC PROTECTION STATION ×2 IMPLANT
NEEDLE PERC 18GX7CM (NEEDLE) ×2 IMPLANT
NS IRRIG 1000ML POUR BTL (IV SOLUTION) ×4 IMPLANT
PACK AORTA (CUSTOM PROCEDURE TRAY) ×2 IMPLANT
PAD ARMBOARD 7.5X6 YLW CONV (MISCELLANEOUS) ×4 IMPLANT
PENCIL BUTTON HOLSTER BLD 10FT (ELECTRODE) IMPLANT
PERCLOSE PROGLIDE 6F (VASCULAR PRODUCTS) ×8
SHEATH AVANTI 11CM 8FR (MISCELLANEOUS) ×2 IMPLANT
SHEATH BRITE TIP 8FR 23CM (MISCELLANEOUS) ×2 IMPLANT
SHEATH DRYSEAL FLEX 14FR 33CM (SHEATH) ×1 IMPLANT
SHEATH DRYSEAL FLEX 18FR 33CM (SHEATH) ×1 IMPLANT
SNAP KAP ×4 IMPLANT
STAPLER VISISTAT 35W (STAPLE) IMPLANT
STENT GRAFT BALLN CATH 65CM (STENTS) ×1
STOPCOCK MORSE 400PSI 3WAY (MISCELLANEOUS) ×2 IMPLANT
SUT ETHILON 3 0 PS 1 (SUTURE) IMPLANT
SUT MNCRL AB 4-0 PS2 18 (SUTURE) ×4 IMPLANT
SUT PROLENE 5 0 C 1 24 (SUTURE) IMPLANT
SUT VIC AB 2-0 CTX 36 (SUTURE) IMPLANT
SUT VIC AB 3-0 SH 27 (SUTURE)
SUT VIC AB 3-0 SH 27X BRD (SUTURE) IMPLANT
SYR 20CC LL (SYRINGE) ×4 IMPLANT
SYR 30ML LL (SYRINGE) IMPLANT
SYR MEDRAD MARK V 150ML (SYRINGE) ×2 IMPLANT
SYRINGE 10CC LL (SYRINGE) ×6 IMPLANT
TOWEL OR 17X24 6PK STRL BLUE (TOWEL DISPOSABLE) ×4 IMPLANT
TOWEL OR 17X26 10 PK STRL BLUE (TOWEL DISPOSABLE) ×6 IMPLANT
TRAY FOLEY CATH 14FRSI W/METER (CATHETERS) ×2 IMPLANT
TUBING HIGH PRESSURE 120CM (CONNECTOR) ×4 IMPLANT
WATER STERILE IRR 1000ML POUR (IV SOLUTION) ×2 IMPLANT
WIRE AMPLATZ SS-J .035X180CM (WIRE) ×4 IMPLANT
WIRE BENTSON .035X145CM (WIRE) ×4 IMPLANT

## 2011-08-11 NOTE — Progress Notes (Signed)
Clarified Coumadin order for tonight with Dr. Imogene Burn.  He does not want patient to receive a dose of coumadin tonight.  Roselie Awkward, RN

## 2011-08-11 NOTE — Anesthesia Procedure Notes (Signed)
Procedure Name: Intubation Date/Time: 08/11/2011 7:52 AM Performed by: Jerilee Hoh Pre-anesthesia Checklist: Patient identified, Emergency Drugs available, Suction available and Patient being monitored Patient Re-evaluated:Patient Re-evaluated prior to inductionOxygen Delivery Method: Circle system utilized Preoxygenation: Pre-oxygenation with 100% oxygen Intubation Type: IV induction Ventilation: Mask ventilation without difficulty Laryngoscope Size: Mac and 4 Grade View: Grade III Tube type: Oral Tube size: 7.5 mm Number of attempts: 2 Airway Equipment and Method: Stylet Placement Confirmation: ETT inserted through vocal cords under direct vision,  positive ETCO2 and breath sounds checked- equal and bilateral Secured at: 22 cm Tube secured with: Tape Dental Injury: Teeth and Oropharynx as per pre-operative assessment  Difficulty Due To: Difficult Airway- due to reduced neck mobility and Difficult Airway- due to limited oral opening Comments: Smooth IV induction.  Easy mask airway.  DL by CRNA, only able to view epiglottis.  DL by MDA, view of epiglottis only, ETT successfully passed through cords.  Atraumatic intubation.  +EtCO2, +BBS

## 2011-08-11 NOTE — H&P (Signed)
[facility] [place]  Brief History and Physical  History of Present Illness  [name] is a 61 y.o. male who presents with chief complaint: bilateral common iliac artery aneurysms.  The patient presents today for EVAR.    He previously had L IIA embolization as the first stage of repair of his aneurysms.    Past Medical History  Diagnosis Date  . Hypertension   . Chronic pain   . Obesity   . DVT (deep venous thrombosis)   . Dysrhythmia     atrial fibrilation  . Shortness of breath   . Blood transfusion 05/07/2011     " no reaction "  . Arthritis   . Hyperlipemia   . Sleep apnea     not wearing cpap    Past Surgical History  Procedure Date  . Knee surgery   . Esophagogastroduodenoscopy 05/08/2011    Procedure: ESOPHAGOGASTRODUODENOSCOPY (EGD);  Surgeon: Theda Belfast, MD;  Location: [facility] ENDOSCOPY;  Service: Endoscopy;  Laterality: N/A;  . Colonoscopy 05/11/2011    Procedure: COLONOSCOPY;  Surgeon: Charna Elizabeth, MD;  Location: [facility] ENDOSCOPY;  Service: Endoscopy;  Laterality: N/A;  . Colonoscopy 05/12/2011    Procedure: COLONOSCOPY;  Surgeon: Theda Belfast, MD;  Location: [facility] ENDOSCOPY;  Service: Endoscopy;  Laterality: N/A;  may be cx abnormal ct  . Achilles tendon surgery     History   Social History  . Marital Status: Divorced    Spouse Name: N/A    Number of Children: N/A  . Years of Education: N/A   Occupational History  . Not on file.   Social History Main Topics  . Smoking status: Former Smoker -- 0.5 packs/day for 2 years  . Smokeless tobacco: Never Used  . Alcohol Use: No  . Drug Use: No  . Sexually Active: Yes   Other Topics Concern  . Not on file   Social History Narrative  . No narrative on file    Family History  Problem Relation Age of Onset  . Cancer Mother     No current facility-administered medications on file prior to encounter.   Current Outpatient Prescriptions on File Prior to Encounter  Medication Sig  Dispense Refill  . allopurinol (ZYLOPRIM) 100 MG tablet Take 100 mg by mouth daily.      [name] desonide (DESOWEN) 0.05 % cream Apply 1 application topically daily. Apply to rash on face      . digoxin (LANOXIN) 0.125 MG tablet Take 125 mcg by mouth daily. Hold if pulse less than 60.      . fluocinonide ointment (LIDEX) 0.05 % Apply 1 application topically 2 (two) times daily. Apply to dry skin areas on body      . metoprolol (LOPRESSOR) 50 MG tablet Take 1 tablet (50 mg total) by mouth 2 (two) times daily.      . ondansetron (ZOFRAN-ODT) 8 MG disintegrating tablet Take 8 mg by mouth every 8 (eight) hours as needed.       [name] oxyCODONE-acetaminophen (PERCOCET) 10-325 MG per tablet Take 1 tablet by mouth every 6 (six) hours as needed. Pain.      . pravastatin (PRAVACHOL) 40 MG tablet Take 40 mg by mouth daily.      [name] TAZTIA XT 360 MG 24 hr capsule Take 360 mg by mouth daily.       [name] triamcinolone ointment (KENALOG) 0.1 % Apply 1 application topically daily. Apply to skin rash areas on body      .  warfarin (COUMADIN) 5 MG tablet Take 5 mg by mouth daily.      [name] zolpidem (AMBIEN) 5 MG tablet Take 5 mg by mouth at bedtime as needed.         No Known Allergies  Review of Systems: As listed above, otherwise negative.  Physical Examination  Filed Vitals:   08/11/11 0607  BP: 174/96  Pulse: 70  Temp: 98.1 F (36.7 C)  TempSrc: Oral  Resp: 20  SpO2: 98%    General: A&O x 3, WDWN, morbidly obese  Pulmonary: Sym exp, good air movt, CTAB, no rales, rhonchi, & wheezing  Cardiac: RRR, Nl S1, S2, no Murmurs, rubs or gallops  Gastrointestinal: soft, NTND, -G/R, - HSM, - masses, - CVAT B  Musculoskeletal: M/S 5/5 throughout , Extremities without ischemic changes   Laboratory: CBC:    Component Value Date/Time   WBC 5.3 08/06/2011 1008   RBC 4.87 08/06/2011 1008   HGB 13.8 08/06/2011 1008   HCT 41.0 08/06/2011 1008   PLT 273 08/06/2011 1008   MCV 84.2 08/06/2011 1008   MCH 28.3 08/06/2011 1008    MCHC 33.7 08/06/2011 1008   RDW 17.9* 08/06/2011 1008   LYMPHSABS 0.7 06/29/2011 1445   MONOABS 0.6 06/29/2011 1445   EOSABS 0.1 06/29/2011 1445   BASOSABS 0.0 06/29/2011 1445    BMP:    Component Value Date/Time   NA 135 08/06/2011 1008   K 3.7 08/06/2011 1008   CL 101 08/06/2011 1008   CO2 19 08/06/2011 1008   GLUCOSE 104* 08/06/2011 1008   BUN 16 08/06/2011 1008   CREATININE 0.82 08/06/2011 1008   CALCIUM 9.6 08/06/2011 1008   GFRNONAA >90 08/06/2011 1008   GFRAA >90 08/06/2011 1008    Coagulation: Lab Results  Component Value Date   INR 1.16 08/11/2011   INR 1.80* 08/06/2011   INR 0.91 07/30/2011   No results found for this basename: PTT     Medical Decision Making  [name] is a 61 y.o. male who presents with: bilateral large common iliac artery aneurysms.   The patient is scheduled for: EVAR  I discussed with the patient the nature of angiographic procedures, especially the limited patencies of any endovascular intervention.    The patient is aware of that the risks of an angiographic procedure include but are not limited to: bleeding, infection, access site complications, renal failure, embolization, rupture of vessel, dissection, possible need for emergent surgical intervention, possible need for surgical procedures to treat the patient's pathology, and stroke and death.    The patient is aware the risks of aortic surgery include but are not limited to: bleeding, need for transfusion, infection, death, stroke, paralysis, wound complications, bowel injuries, impotence, bowel ischemia, extended ventilation and future ventral hernias.  Overall, I cited a mortality rate of 1-2% and morbidity rate of 15%.  The patient is aware of the risks and agrees to proceed.  Leonides Sake, MD [facility] [place] Office: [phone] Pager: [phone]  08/11/2011, 7:09 AM

## 2011-08-11 NOTE — Addendum Note (Signed)
Addendum  created 08/11/11 1112 by Jerilee Hoh, CRNA   Modules edited:Charges VN

## 2011-08-11 NOTE — Preoperative (Signed)
Beta Blockers   Reason not to administer Beta Blockers:Not Applicable 

## 2011-08-11 NOTE — Anesthesia Postprocedure Evaluation (Signed)
  Anesthesia Post-op Note  Patient: [name]  Procedure(s) Performed: Procedure(s) (LRB): ABDOMINAL AORTIC ENDOVASCULAR STENT GRAFT (N/A)  Patient Location: PACU  Anesthesia Type: General  Level of Consciousness: awake and alert   Airway and Oxygen Therapy: Patient Spontanous Breathing  Post-op Pain: mild  Post-op Assessment: Post-op Vital signs reviewed, Patient's Cardiovascular Status Stable, Respiratory Function Stable, Patent Airway, No signs of Nausea or vomiting and Pain level controlled  Post-op Vital Signs: stable  Complications: No apparent anesthesia complications

## 2011-08-11 NOTE — Anesthesia Preprocedure Evaluation (Signed)
Anesthesia Evaluation  Patient identified by MRN, date of birth, ID band Patient awake    Reviewed: Allergy & Precautions, H&P , NPO status , Patient's Chart, lab work & pertinent test results  Airway Mallampati: II TM Distance: >3 FB Neck ROM: Full    Dental   Pulmonary shortness of breath, sleep apnea , former smoker,  + rhonchi         Cardiovascular hypertension, + Peripheral Vascular Disease Rhythm:Regular Rate:Normal     Neuro/Psych    GI/Hepatic   Endo/Other  Morbid obesity  Renal/GU      Musculoskeletal   Abdominal (+) + obese,   Peds  Hematology   Anesthesia Other Findings   Reproductive/Obstetrics                           Anesthesia Physical Anesthesia Plan  ASA: III  Anesthesia Plan: General   Post-op Pain Management:    Induction: Intravenous  Airway Management Planned: Oral ETT  Additional Equipment: CVP  Intra-op Plan:   Post-operative Plan: Extubation in OR  Informed Consent: I have reviewed the patients History and Physical, chart, labs and discussed the procedure including the risks, benefits and alternatives for the proposed anesthesia with the patient or authorized representative who has indicated his/her understanding and acceptance.     Plan Discussed with: CRNA and Surgeon  Anesthesia Plan Comments:         Anesthesia Quick Evaluation

## 2011-08-11 NOTE — Op Note (Signed)
NAME: [name]   MRN: [id number] DOB: [birth year]    DATE OF OPERATION: 08/11/2011  PREOP DIAGNOSIS: Bilateral common iliac artery aneurysms  POSTOP DIAGNOSIS: Same  PROCEDURE: ultrasound-guided percutaneous access to the right common femoral artery for introduction of contralateral limb of endovascular stent graft  COSURGEONs: Di Kindle. Edilia Bo, MD, Leonides Sake, MD  ANESTHESIA: Gen.   INDICATIONS: [name] is a 61 y.o. male with bilateral common iliac artery aneurysms. He is brought in for elective EVAR.  TECHNIQUE:  The patient received a general anesthetic. The patient was prepped and draped in usual sterile fashion.Under ultrasound guidance, the right common femoral artery was cannulated and a guidewire introduced into the infrarenal aorta. The track was dilated with an 8 [place] dilator and then the artery reclosed with 2 Perclose devices. The first Perclose device was rotated 30 medially. The second Perclose device was rotated 30 laterally. After the trunk ipsilateral component had been placed, the contralateral gate was cannulated and positioning of the catheter confirmed that it was within the body of the graft. Next an Amplatz wire was placed from the right side. A 14 French sheath was introduced over the wire into the contralateral gate. The 23 millimeter by 12 cm device was introduced through the sheath and the sheath retracted. The contralateral device was placed with 3 cm of overlap into the contralateral gate. This was deployed in good position. At the completion of the procedure the sheath was removed over the wire. The Perclose devices were secured and it was good hemostasis. The wire was then removed and additional hemostasis obtained. The sutures were cut and the skin closed with a 4-0 subcuticular stitch. The patient tolerated the procedure well and was transferred to the recovery room in stable condition. All needle and sponge counts were correct.   Waverly Ferrari, MD, FACS [facility]  DATE OF DICTATION:   08/11/2011

## 2011-08-11 NOTE — Transfer of Care (Signed)
Immediate Anesthesia Transfer of Care Note  Patient: [name]  Procedure(s) Performed: Procedure(s) (LRB): ABDOMINAL AORTIC ENDOVASCULAR STENT GRAFT (N/A)  Patient Location: PACU  Anesthesia Type: General  Level of Consciousness: awake, alert , oriented and patient cooperative  Airway & Oxygen Therapy: Patient Spontanous Breathing and Patient connected to face mask oxygen  Post-op Assessment: Report given to PACU RN, Post -op Vital signs reviewed and stable and Patient moving all extremities  Post vital signs: Reviewed and stable  Complications: No apparent anesthesia complications

## 2011-08-11 NOTE — Discharge Summary (Signed)
[facility] Discharge Summary   Patient ID:  [name] MRN: [id number] DOB/AGE: 1950-08-07 61 y.o.  Admit date: 08/11/2011 Discharge date: 08/11/2011 Date of Surgery: 08/11/2011 Surgeon: Surgeon(s): Fransisco Hertz, MD Chuck Hint, MD  Admission Diagnosis: AAA  Discharge Diagnoses:  AAA  Secondary Diagnoses: Past Medical History  Diagnosis Date  . Hypertension   . Chronic pain   . Obesity   . DVT (deep venous thrombosis)   . Dysrhythmia     atrial fibrilation  . Shortness of breath   . Blood transfusion 05/07/2011     " no reaction "  . Arthritis   . Hyperlipemia   . Sleep apnea     not wearing cpap    Procedure(s): ABDOMINAL AORTIC ENDOVASCULAR STENT GRAFT  Discharged Condition: good  HPI:  [name] is a 61 y.o. male who presents with chief complaint: bilateral common iliac artery aneurysms. Previous studies demonstrate an R CIA aneurysm 3.2 cm, L CIA aneurysm 3.6 cm. An aortogram was completed to verify these findings as his obesity caused significant deterioration of quality of imaging.  The patient presents today for EVAR. He previously had L IIA embolization as the first stage of repair of his aneurysms.    Hospital Course:  [name] is a 61 y.o. male is S/P Procedure(s): ABDOMINAL AORTIC ENDOVASCULAR STENT GRAFT Extubated: POD # 0 Post-op wounds healing well Pt. Ambulating, voiding and taking PO diet without difficulty. Pt pain controlled with PO pain meds. Labs as below Complications:none  Consults:     Significant Diagnostic Studies: CBC Lab Results  Component Value Date   WBC 5.9 08/11/2011   HGB 12.1* 08/11/2011   HCT 37.5* 08/11/2011   MCV 85.2 08/11/2011   PLT 233 08/11/2011    BMET    Component Value Date/Time   NA 135 08/06/2011 1008   K 3.7 08/06/2011 1008   CL 101 08/06/2011 1008   CO2 19 08/06/2011 1008   GLUCOSE 104* 08/06/2011 1008   BUN 16 08/06/2011 1008   CREATININE 0.82 08/06/2011 1008   CALCIUM 9.6 08/06/2011 1008   GFRNONAA >90 08/06/2011 1008   GFRAA >90 08/06/2011 1008   COAG Lab Results  Component Value Date   INR 1.18 08/11/2011   INR 1.16 08/11/2011   INR 1.80* 08/06/2011     Disposition:  Discharge to :Home Discharge Orders    Future Appointments: Provider: Department: Dept Phone: Center:   09/08/2011 9:30 AM Corwin Levins, MD [facility] [phone] [facility]     Future Orders Please Complete By Expires   Resume previous diet      Driving Restrictions      Comments:   No driving for 4 weeks   Lifting restrictions      Comments:   No lifting for 6 weeks   Call MD for:  temperature >100.5      Call MD for:  redness, tenderness, or signs of infection (pain, swelling, bleeding, redness, odor or green/yellow discharge around incision site)      Call MD for:  severe or increased pain, loss or decreased feeling  in affected limb(s)      ABDOMINAL PROCEDURE/ANEURYSM REPAIR/AORTO-BIFEMORAL BYPASS:  Call MD for increased abdominal pain; cramping diarrhea; nausea/vomiting      Remove dressing in 24 hours      Increase activity slowly      Comments:   Walk with assistance use walker or cane as needed   May shower  Scheduling Instructions:   thursday   may wash over wound with mild soap and water         Carrick, Rijos  Home Medication Instructions ZOX:[id number]   Printed on:08/11/11 1104  Medication Information                    digoxin (LANOXIN) 0.125 MG tablet Take 125 mcg by mouth daily. Hold if pulse less than 60.           pravastatin (PRAVACHOL) 40 MG tablet Take 40 mg by mouth daily.           triamcinolone ointment (KENALOG) 0.1 % Apply 1 application topically daily. Apply to skin rash areas on body           fluocinonide ointment (LIDEX) 0.05 % Apply 1 application topically 2 (two) times daily. Apply to dry skin areas on body           desonide (DESOWEN) 0.05 % cream Apply 1 application topically daily. Apply to rash on face             metoprolol (LOPRESSOR) 50 MG tablet Take 1 tablet (50 mg total) by mouth 2 (two) times daily.           warfarin (COUMADIN) 5 MG tablet Take 5 mg by mouth daily.           allopurinol (ZYLOPRIM) 100 MG tablet Take 100 mg by mouth daily.           TAZTIA XT 360 MG 24 hr capsule Take 360 mg by mouth daily.            ondansetron (ZOFRAN-ODT) 8 MG disintegrating tablet Take 8 mg by mouth every 8 (eight) hours as needed.            zolpidem (AMBIEN) 5 MG tablet Take 5 mg by mouth at bedtime as needed.            oxyCODONE-acetaminophen (PERCOCET) 10-325 MG per tablet Take 1 tablet by mouth every 6 (six) hours as needed. Pain.            Verbal and written Discharge instructions given to the patient. Wound care per Discharge AVS Follow-up Information    Follow up with Nilda Simmer, MD in 1 month. (with CT scan- office will arange -sent)    Contact information:   [address] [place] [address] [phone]          Signed: Lianne Cure PA-C  Addendum  I have independently interviewed and examined the patient, and I agree with the physician assistant's findings.  Both groins c/d/i without hematoma.  Both feet warm with dopplerable signals and palpable DP bilateral (R>L).  Patient will follow up in 1 month with follow up CTA Abd/Pelvis.  Leonides Sake, MD [facility] [place] Office: [phone] Pager: [phone]  08/12/2011, 8:22 AM   08/11/2011, 11:04 AM

## 2011-08-11 NOTE — Op Note (Addendum)
OPERATIVE NOTE   PROCEDURE:  1. Bilateral common femoral artery cannulation under ultrasound guidance 2. "Preclose" repair bilateral common femoral artery  3. Placement of catheter in aorta x 2 4. Aortogram 5. Repair of aorta with modular bifurcated prosthesis with one limb (26 mm x 14 mm x 18 cm) 6. Placement of right limb extension: 23 mm x 12 cm 7. Placement of left limb extension: 14 mm x 7 cm  8. Radiology S&I  PRE-OPERATIVE DIAGNOSIS: large bilateral common iliac artery aneurysms  POST-OPERATIVE DIAGNOSIS: same as above   CO-SURGEONS: Leonides Sake, MD; Cari Caraway, MD   ANESTHESIA: general   ESTIMATED BLOOD LOSS: 100 cc   FINDING(S):  1. No endoleak leaks visualized 2. Bilateral renal arteries widely patent at end of case 3. Successful exclusion of bilateral common iliac artery aneurysm 4. Dopplerable bilateral dorsalis pedis signals  INDICATIONS:  [name] is a 61 y.o. male with large bilateral common iliac artery aneurysms.  Due to the size of the common iliac artery aneurysms, the patient needed repair of his bilateral common iliac artery aneurysm.  As the left one was larger, I felt that total exclusion of this one was necessary, so his left internal iliac artery was embolized at a prior procedure, with the plan to fully cover this aneurysm.  The right common iliac artery aneurysm appeared small enough to seal with a limb extension as part of an EVAR.  The patient is aware the risks of aortic surgery include but are not limited to: bleeding, need for transfusion, infection, death, stroke, paralysis, wound complications, impotence, bowel ischemia, extended ventilation and need for secondary procedures. Overall, I cited a mortality rate of 1-2% and morbidity rate of 15%.   The patient is aware that if the right common iliac artery aneurysm enlarges, he may require an additional procedure on this side to address the enlargement of that aneurysm.  DESCRIPTION:After full  informed written consent was obtained from the patient, he was brought back to the operating room, amd placed supine upon the operating table. He already had a A-line place. After obtaining adequate anesthesia, a Foley catheter was placed to monitor urine output in the operating room. He was prepped and draped in the standard fashion for an endovascular aneurysm repair. I turned my attention to the left groin. Under ultrasound guidance, I identified the left common femoral artery and made a stab incision over it. I dissected down to the artery in the groin with a hemostat and then cannulated it with a 18-gauge needle. I then passed a Bentson wire up into the iliac system.  Initially, a 8-French sheath was loaded over the wire and used to dilate up this tract.  The first ProGlide was placed and rotated medially. The second ProGlide was placed and rotated laterally. In such fashion, I completed the Pre-close technique on this side and then replaced the Bentson wire up into the aorta.  Then using a Kumpfe catheter, I was able to get up into the aorta and exchange the wire out for a Amplatz wire. At this point then the right groin was cannulated by Dr. Edilia Bo in a similar fashion to my side and the Pre-close technique was utilized on this side.  A long 8-Fr sheath was placed over the wire.  At this point, the sheath on the left side was exchanged for a 18-French sheath.  I delivered the main body device, which was a 26-mm x 14-mm x 18-cm, up to the level about L1.  On Dr. Adele Dan site, then he loaded a pigtail catheter.  This was connected to the power injector circuit and a power injector aortogram was completed to identify the renal arteries. The lowest artery was the left renal artery.  I pulled the main body device to below this level and deployed it, taking care to take into account the angulation here.  I was able to deploy the graft exactly below the level of the left renal artery avoiding covering it. At this  point, Dr. Edilia Bo pulled back the pigtail catheter and wire and then exchanged out the catheter and wire for a Kumpfe catheter and Glidewire.  Using this combination, he was able to cannulate the contralateral gate which was in an anatomic configuration. He was then able to advance both of these through the graft. The pigtail was then pulled down into the graft and rotated to demonstrate successful cannulation of the graft.  He then pulled the catheter down to the flow bifurcation and then pulled the sheath back on the left side and did a retrograde injection from the sheath demonstrating the level of the internal iliac artery.  Then subsequently based on the measurements, a 23-mm x 12 cm contralateral limb was selected. At this point, the left 8-French sheath was exchanged out for a 14-French sheath which was then advanced up to level of flow divider and the device.  The contralateral limb was then placed through this sheath and then the sheath was pulled back to appropriate location.  The iliac device was then deployed with adequate overlap. He then removed the sheath deployment system. At this point, I fully deployed the main body, fully extending the partially constrained limb and also releasing the device from the delivery system. The delivery system was then removed.  I then pulled the sheath below the level of the iliac artery bifurcation.  A retrograde injection was completed.  Based on the images, a 14 mm x 7 cm iliac extension was selected.  It was deployed with adequate overlap.  We then obtained a molding balloon which was used to mold the top, the flow divider and the ends of each iliac limb. The pigtail catheter was replaced on the left side and then a completion aortogram was completed. This demonstrated no endoleaks.  Subsequently, we turned our attention to repairing each groin. The left groin was repaired by cinching down the ProGlide devices in two stages.  In the first stage, the wire was left  in place after removing the sheath. There was absolutely no drop in pressure with removal of the sheath and there was minimal blood loss with the wire in place. The wire was then removed from the right groin and the white tightening stitches was pulled to fully tighten the Proglide devices. All four sutures were held in place with a hemostat with tension on the skin. In a similar fashion, the right groin was repaired. There was no further significant bleeding from either groin cannulation site so the sutures were transected with the suture transection device. Each groin was repaired with a U stitch of 4-0 Monocryl. Each foot was then tested: both had faintly palpable dorsalis pedis pulses and dopplerable dorsalis pedis signals bilaterally.   SPECIMEN(S): none   COMPLICATIONS: none   CONDITION: stable  Leonides Sake, MD [facility] [place] Office: [phone] Pager: [phone]  08/11/2011, 1:29 PM

## 2011-08-12 LAB — BASIC METABOLIC PANEL
BUN: 11 mg/dL (ref 6–23)
Chloride: 105 mEq/L (ref 96–112)
GFR calc Af Amer: >90 mL/min (ref >90)
GFR calc non Af Amer: >90 mL/min (ref >90)
Potassium: 4.2 mEq/L (ref 3.5–5.1)

## 2011-08-12 LAB — CBC
MCHC: 32.1 g/dL (ref 30.0–36.0)
Platelets: 201 10*3/uL (ref 150–400)
RDW: 18.1 % — ABNORMAL HIGH (ref 11.5–15.5)
WBC: 8.1 10*3/uL (ref 4.0–10.5)

## 2011-08-12 LAB — PROTIME-INR: Prothrombin Time: 15.6 seconds — ABNORMAL HIGH (ref 11.6–15.2)

## 2011-08-12 NOTE — Progress Notes (Signed)
Utilization review completed.  

## 2011-08-12 NOTE — Progress Notes (Signed)
Pt voiding, eating and ambulating at baseline without difficulty.  Discharge instructions given to patient.  Pt discharged home with friend.  Roselie Awkward, RN

## 2011-08-12 NOTE — Progress Notes (Addendum)
[facility] OF   Post-op EVAR Date of Surgery: 08/11/2011 Surgeon: Surgeon(s): Fransisco Hertz, MD Chuck Hint, MD POD: 1 Day Post-Op Device:   History of Present Illness  [name] is a 61 y.o. male who is s/p EVAR. The patient denies back pain; denies abdominal pain; denies lower extremity pain.  He is Ambulating and taking PO well without nausea or vomiting. Pt. has not voided with foley out  IMAGING: Dg Chest Portable 1 View  08/11/2011  *RADIOLOGY REPORT*  Clinical Data: Stent graft placement.  PORTABLE CHEST - 1 VIEW  Comparison: 05/07/2011.  Findings: Image quality is degraded by body habitus.  Right IJ catheter sheath terminates in the region of the right internal jugular and subclavian vein junction.  Heart is enlarged. Overall prominence of the cardiomediastinal silhouette may be due to low lung volumes.  Lungs are low in volume with mild bibasilar atelectasis.  No definite pleural fluid.  IMPRESSION: Overall prominence of the cardiomediastinal silhouette may be due to low lung volumes.  Mild bibasilar atelectasis.  Original Report Authenticated By: Reyes Ivan, M.D.   Dg Abd Portable 1v  08/11/2011  *RADIOLOGY REPORT*  Clinical Data: Stent graft placement.  PORTABLE ABDOMEN - 1 VIEW  Comparison: 06/03/2011  Findings: 2 supine views.  There is an IVC filter which projects over the A L1-2 level and is unchanged.  An aortic stent graft is also identified and extends from the L1 level into the upper pelvis.  Mild degradation secondary patient body habitus.  Given this factor, no evidence of bowel obstruction or free intraperitoneal air.  There are vascular coils within the left hemi pelvis. Phleboliths in the left hemi pelvis as well.  Distal gas and stool.  IMPRESSION: Postsurgical changes of the aortic stent graft repair.  No acute findings.  Original Report Authenticated By: Consuello Bossier, M.D.    Significant Diagnostic Studies: CBC Lab  Results  Component Value Date   WBC 8.1 08/12/2011   HGB 11.2* 08/12/2011   HCT 34.9* 08/12/2011   MCV 84.9 08/12/2011   PLT 201 08/12/2011     BMET    Component Value Date/Time   NA 137 08/12/2011 0450   K 4.2 08/12/2011 0450   CL 105 08/12/2011 0450   CO2 22 08/12/2011 0450   GLUCOSE 98 08/12/2011 0450   BUN 11 08/12/2011 0450   CREATININE 0.67 08/12/2011 0450   CALCIUM 8.6 08/12/2011 0450   GFRNONAA >90 08/12/2011 0450   GFRAA >90 08/12/2011 0450    COAG Lab Results  Component Value Date   INR 1.21 08/12/2011   INR 1.18 08/11/2011   INR 1.16 08/11/2011   No results found for this basename: PTT     I/O last 3 completed shifts: In: 4516.7 [P.O.:600; I.V.:3916.7] Out: 1955 [Urine:1955] No data found.   Physical Examination  BP Readings from Last 3 Encounters:  08/12/11 128/79  08/12/11 128/79  08/06/11 123/76   Temp Readings from Last 3 Encounters:  08/12/11 98.9 F (37.2 C)   08/12/11 98.9 F (37.2 C)   08/06/11 97.6 F (36.4 C)    SpO2 Readings from Last 3 Encounters:  08/12/11 95%  08/12/11 95%  08/06/11 97%   Pulse Readings from Last 3 Encounters:  08/12/11 55  08/12/11 55  08/06/11 89    General: A&O x 3, WDWN male in NAD Gait: Normal Pulmonary: normal non-labored breathing  Cardiac: RRR Abdomen: soft, NT, NABS Bilateral groin wounds: clean, dry, intact, without  hematoma Vascular Exam/Pulses:right weak PT, intact biphasic DP and Left doppler PT/DP.  Extremities without ischemic changes, no Gangrene , no cellulitis; no open wounds;   Neurologic: A&O X 3; Appropriate Affect  Assessment: [name] is a 61 y.o. male who is 1 Day Post-Op EVAR.  Pt is doing well with no complaints  Plan: Home  The importance of surveillance of the endograft was discussed with the patient  A CTA of abdomen and pelvis will be scheduled for one month to assess for endoleak.  The patient will follow up with [place] in one month with these studies.   SignedMosetta Pigeon [phone] 08/12/2011 7:41 AM.  Addendum  I have independently interviewed and examined the patient, and I agree with the physician assistant's findings.  Warm feet with dopplerable pedal signals.  Bilateral groins without hematoma.  Incisions c/d/i.  Leonides Sake, MD [facility] [place] Office: [phone] Pager: [phone]  08/12/2011, 8:20 AM

## 2011-08-12 NOTE — Care Management Note (Signed)
    Page 1 of 2   08/12/2011     1:55:30 PM   CARE MANAGEMENT NOTE 08/12/2011  Patient:  [name], [name]   Account Number:  [id number]  Date Initiated:  08/12/2011  Documentation initiated by:  Donn Pierini  Subjective/Objective Assessment:   Pt admitted s/p ABDOMINAL AORTIC ENDOVASCULAR STENT GRAFT (N/A)     Action/Plan:   PTA pt lived at home alone, has [facility] services   Anticipated DC Date:  08/12/2011   Anticipated DC Plan:  HOME W HOME HEALTH SERVICES      DC Planning Services  CM consult      [facility] Choice  Resumption Of Svcs/PTA Provider   Choice offered to / List presented to:  C-1 Patient        HH arranged  HH-1 RN  HH-2 PT  HH-4 NURSE'S AIDE      HH agency  [facility]   Status of service:  Completed, signed off Medicare Important Message given?   (If response is "NO", the following Medicare IM given date fields will be blank) Date Medicare IM given:   Date Additional Medicare IM given:    Discharge Disposition:  HOME W HOME HEALTH SERVICES  Per UR Regulation:  Reviewed for med. necessity/level of care/duration of stay  If discussed at Long Length of Stay Meetings, dates discussed:    Comments:  08/12/11- 1130- Donn Pierini RN, BSN [phone] Per return call from [facility] with [facility] - pt is not active with them-   --- Pt called back with name of [facility] RN- Bjorn [phone]- still does not have name of agency- call made to Bjorn Loser- who identified herself as a  HH-RN with [facility]- she confirmed that pt is active with them for RN, PT, and aide- faxed resumption orders to [facility]- [phone](NFA #provided by Bjorn Loser) along with d/c instructions per request. Services will resume within 24-48 hr.  08/12/11- 1015- Donn Pierini RN, BSN [phone] Pt for discharge today, spoke with pt at bedside regarding [facility] services. Per conversation pt states that he has [facility] services prior to coming in- RN,PT,aide was coming out to the home, he also has a RW at home. Pt does not  remember name of agency- reviewed list- thinks it  might be [facility]- call made to [facility] with [facility] to see if pt is active with them, will await return call- MD has placed resumption orders- will make referral when agency known.

## 2011-08-13 ENCOUNTER — Telehealth: Payer: Self-pay | Admitting: Vascular Surgery

## 2011-08-13 NOTE — Telephone Encounter (Signed)
Message copied by Fredrich Birks on Thu Aug 13, 2011 11:02 AM ------      Message from: Melene Plan      Created: Tue Aug 11, 2011 10:26 AM                   ----- Message -----         From: Fransisco Hertz, MD         Sent: 08/11/2011  10:10 AM           To: Reuel Derby, Melene Plan, RN            [name]      [id number]      [birth year]            PROCEDURE:        1. Bilateral common femoral artery cannulation under ultrasound guidance      2.  "Preclose" repair bilateral common femoral artery        3.  Placement of catheter in aorta x 2      4.  Aortogram      5.  Repair of aorta with modular bifurcated prosthesis with one limb (26 mm x 14 mm x 18 cm)      6.  Placement of right limb extension: 23 mm x 12 cm      7.  Placement of left limb extension:14 mm x 7 cm        8.  Radiology S&I            Cosurg: Dr. Edilia Bo            Follow-up: 4 weeks            Orders(s) for follow-up: CTA Abd/Pelvis

## 2011-08-13 NOTE — Telephone Encounter (Signed)
Spoke with patient to notify of all appointments, also sent letter, dpm

## 2011-09-07 ENCOUNTER — Encounter: Payer: Self-pay | Admitting: Internal Medicine

## 2011-09-07 DIAGNOSIS — I2721 Secondary pulmonary arterial hypertension: Secondary | ICD-10-CM | POA: Insufficient documentation

## 2011-09-07 DIAGNOSIS — R9431 Abnormal electrocardiogram [ECG] [EKG]: Secondary | ICD-10-CM | POA: Insufficient documentation

## 2011-09-07 DIAGNOSIS — Z0001 Encounter for general adult medical examination with abnormal findings: Secondary | ICD-10-CM | POA: Insufficient documentation

## 2011-09-07 DIAGNOSIS — Z0000 Encounter for general adult medical examination without abnormal findings: Secondary | ICD-10-CM | POA: Insufficient documentation

## 2011-09-08 ENCOUNTER — Encounter: Payer: Self-pay | Admitting: Internal Medicine

## 2011-09-08 ENCOUNTER — Ambulatory Visit (INDEPENDENT_AMBULATORY_CARE_PROVIDER_SITE_OTHER): Payer: Medicare Other | Admitting: Internal Medicine

## 2011-09-08 VITALS — BP 122/70 | HR 81 | Temp 97.4°F | Ht 74.0 in | Wt 300.5 lb

## 2011-09-08 DIAGNOSIS — Z7901 Long term (current) use of anticoagulants: Secondary | ICD-10-CM

## 2011-09-08 DIAGNOSIS — Z0000 Encounter for general adult medical examination without abnormal findings: Secondary | ICD-10-CM

## 2011-09-08 DIAGNOSIS — F329 Major depressive disorder, single episode, unspecified: Secondary | ICD-10-CM

## 2011-09-08 DIAGNOSIS — I10 Essential (primary) hypertension: Secondary | ICD-10-CM

## 2011-09-08 DIAGNOSIS — E785 Hyperlipidemia, unspecified: Secondary | ICD-10-CM

## 2011-09-08 DIAGNOSIS — Z23 Encounter for immunization: Secondary | ICD-10-CM

## 2011-09-08 NOTE — Assessment & Plan Note (Signed)
stable overall by hx and exam, most recent data reviewed with pt, and pt to continue medical treatment as before Lab Results  Component Value Date   WBC 8.1 08/12/2011   HGB 11.2* 08/12/2011   HCT 34.9* 08/12/2011   PLT 201 08/12/2011   GLUCOSE 98 08/12/2011   CHOL  Value: 194        ATP III CLASSIFICATION:  <200     mg/dL   Desirable  098-119  mg/dL   Borderline High  >=147    mg/dL   High 08/13/9560   TRIG 102 08/13/2006   HDL 28* 08/13/2006   LDLCALC  Value: 146        Total Cholesterol/HDL:CHD Risk Coronary Heart Disease Risk Table                     Men   Women  1/2 Average Risk   3.4   3.3* 08/13/2006   ALT 9 08/06/2011   AST 13 08/06/2011   NA 137 08/12/2011   K 4.2 08/12/2011   CL 105 08/12/2011   CREATININE 0.67 08/12/2011   BUN 11 08/12/2011   CO2 22 08/12/2011   TSH 2.159 05/14/2011   PSA 0.79 Test Methodology: [employer] PSA 08/16/2006   INR 1.21 08/12/2011

## 2011-09-08 NOTE — Assessment & Plan Note (Signed)
stable overall by hx and exam, most recent data reviewed with pt, and pt to continue medical treatment as before BP Readings from Last 3 Encounters:  09/08/11 122/70  08/12/11 139/77  08/12/11 139/77

## 2011-09-08 NOTE — Progress Notes (Signed)
Subjective:    Patient ID: [name], male    DOB: 30-Jun-1950, 61 y.o.   MRN: [id number]  HPI  Here to establish as new pt in transfer from another PCP whom he states overtreated his anticoagulation/coumadin which led to a recent hospn; was  Most recently d/c after successful AAA repair with stent graft per Dr Imogene Burn, overall doing ok,  Pt denies chest pain, increased sob or doe, wheezing, orthopnea, PND, increased LE swelling, palpitations, dizziness or syncope.  Pt denies new neurological symptoms such as new headache, or facial or extremity weakness or numbness   Pt denies polydipsia, polyuria, or low sugar symptoms such as weakness or confusion improved with po intake.  Pt states overall good compliance with meds, trying to follow lower cholesterol diet, wt overall stable but little exercise however.  No further overt bleeding or bruising on coumadin.  Walking with walker today, plans to get away from walker when his condition improves.  Coumadin now:   (5 mg) - 2 tabs daily except for 3 days per wk take 2.5 tabs. Had iron deficiency by labs about 2 mo ago and normal hgb approx 2 wks ago after oral iron.   Pt denies fever, wt loss, night sweats, loss of appetite, or other constitutional symptoms   No other acute symptoms or compalints. Denies worsening depressive symptoms, suicidal ideation, or panic, though has ongoing anxiety Past Medical History  Diagnosis Date  . Hypertension   . Chronic pain   . Obesity   . DVT (deep venous thrombosis)   . Dysrhythmia     atrial fibrilation  . Shortness of breath   . Blood transfusion 05/07/2011     " no reaction "  . Arthritis   . Hyperlipemia   . Sleep apnea     not wearing cpap  . Prolonged QT interval 09/07/2011  . PAH (pulmonary artery hypertension) 09/07/2011  . Blood in stool   . Depression   . PVD, bilat iliac aneurysms 05/11/2011  . Pulmonary embolism, "Massive" in 2008 05/10/2011  . PAF, was to have DCCV 4/12 but was in NSR, now in and out  of flutter 05/10/2011  . History of DVT (deep vein thrombosis) 05/07/2011  . Chronic anticoagulation, PE DVT PAF 05/10/2011  . Anemia, Hgb 7.1 on admission 05/07/2011  . Psoriasis 05/10/2011  . Morbid obesity 05/10/2011  . HTN (hypertension) 05/07/2011  . Dyslipidemia 05/10/2011   Past Surgical History  Procedure Date  . Knee surgery   . Esophagogastroduodenoscopy 05/08/2011    Procedure: ESOPHAGOGASTRODUODENOSCOPY (EGD);  Surgeon: Theda Belfast, MD;  Location: [facility] ENDOSCOPY;  Service: Endoscopy;  Laterality: N/A;  . Colonoscopy 05/11/2011    Procedure: COLONOSCOPY;  Surgeon: Charna Elizabeth, MD;  Location: [facility] ENDOSCOPY;  Service: Endoscopy;  Laterality: N/A;  . Colonoscopy 05/12/2011    Procedure: COLONOSCOPY;  Surgeon: Theda Belfast, MD;  Location: [facility] ENDOSCOPY;  Service: Endoscopy;  Laterality: N/A;  may be cx abnormal ct  . Achilles tendon surgery     reports that he has quit smoking. He has never used smokeless tobacco. He reports that he does not drink alcohol or use illicit drugs. family history includes Arthritis in his father and mother and Cancer in his mother. No Known Allergies Current Outpatient Prescriptions on File Prior to Visit  Medication Sig Dispense Refill  . allopurinol (ZYLOPRIM) 100 MG tablet Take 100 mg by mouth daily.      [name] desonide (DESOWEN) 0.05 % cream Apply 1 application topically daily.  Apply to rash on face      . digoxin (LANOXIN) 0.125 MG tablet Take 125 mcg by mouth daily. Hold if pulse less than 60.      . metoprolol (LOPRESSOR) 50 MG tablet Take 1 tablet (50 mg total) by mouth 2 (two) times daily.      . ondansetron (ZOFRAN-ODT) 8 MG disintegrating tablet Take 8 mg by mouth every 8 (eight) hours as needed.       [name] oxyCODONE-acetaminophen (PERCOCET) 10-325 MG per tablet Take 1 tablet by mouth every 6 (six) hours as needed. Pain.  20 tablet  0  . pravastatin (PRAVACHOL) 40 MG tablet Take 40 mg by mouth daily.      [name] TAZTIA XT 360 MG 24 hr capsule Take 360 mg by  mouth daily.       [name] triamcinolone ointment (KENALOG) 0.1 % Apply 1 application topically daily. Apply to skin rash areas on body      . warfarin (COUMADIN) 5 MG tablet Take 5 mg by mouth daily.      [name] zolpidem (AMBIEN) 5 MG tablet Take 5 mg by mouth at bedtime as needed.       . fluocinonide ointment (LIDEX) 0.05 % Apply 1 application topically 2 (two) times daily. Apply to dry skin areas on body       Review of Systems Constitutional: Negative for diaphoresis and unexpected weight change.  HENT: Negative for drooling and tinnitus.   Eyes: Negative for photophobia and visual disturbance.  Respiratory: Negative for choking and stridor.   Gastrointestinal: Negative for vomiting and blood in stool.  Genitourinary: Negative for hematuria and decreased urine volume.  Musculoskeletal: Negative for acute joint swelling  Skin: Negative for color change and wound.  Neurological: Negative for tremors  Psychiatric/Behavioral: Negative for decreased concentration. The patient is not hyperactive.      Objective:   Physical Exam BP 122/70  Pulse 81  Temp 97.4 F (36.3 C) (Oral)  Ht 6\' 2"  (1.88 m)  Wt 300 lb 8 oz (136.306 kg)  BMI 38.58 kg/m2  SpO2 97% Physical Exam  VS noted Constitutional: Pt appears well-developed and well-nourished.  HENT: Head: Normocephalic.  Right Ear: External ear normal.  Left Ear: External ear normal.  Eyes: Conjunctivae and EOM are normal. Pupils are equal, round, and reactive to light.  Neck: Normal range of motion. Neck supple.  Cardiovascular: Normal rate and regular rhythm.   Pulmonary/Chest: Effort normal and breath sounds normal.  Abd:  Soft, NT, non-distended, + BS Neurological: Pt is alert. Not confused, o/w not done in detail, walking with walker Skin: Skin is warm. No erythema. No bruising or bleeding Psychiatric: Pt behavior is normal. Thought content normal. mild nervous at best    Assessment & Plan:

## 2011-09-08 NOTE — Assessment & Plan Note (Signed)
stable overall by hx and exam, most recent data reviewed with pt, and pt to continue medical treatment as before; declines f/u lab today, goal ldl < 70

## 2011-09-08 NOTE — Patient Instructions (Addendum)
You had the tetanus shot today Please have them call me this week for Coumadin levels Fairly soon, we will likely be able to get you scheduled at a Coumadin Clinic that will be starting soon Continue all other medications as before Please have the pharmacy call with any refills you may need. Please return in 6 mo with Lab testing done 3-5 days before

## 2011-09-08 NOTE — Assessment & Plan Note (Signed)
Ok for lab to be followed her later this wk, goal inr 2-3, will likely be able to refer to office coumadin clinic when started sept 2013

## 2011-09-10 ENCOUNTER — Telehealth: Payer: Self-pay

## 2011-09-10 NOTE — Telephone Encounter (Signed)
Called HHRN left message to call back 

## 2011-09-10 NOTE — Telephone Encounter (Signed)
I believe this is inaccurate b/c pt stated at his aug 27 appt that he was taking 10 mg daily except for 12.5 mg 3 days per wk  (not alternating qod as suggested in the note)  Ok to hold the coumadin for 2 days; then resume at 10 mg per day except for 12.5 mg on Monday only  For f/u INR 2 wks per home health  (but soon we will be able to refer him to the office coumadin clinic starting up next month)

## 2011-09-10 NOTE — Telephone Encounter (Signed)
HHRN called to report pt's PT 39 INR 3.9. Pt is alternating coumadin daily -10 mg with 12.5 mg.

## 2011-09-10 NOTE — Telephone Encounter (Signed)
HHRN informed of MD instructions. 

## 2011-09-17 ENCOUNTER — Encounter: Payer: Self-pay | Admitting: Vascular Surgery

## 2011-09-18 ENCOUNTER — Encounter: Payer: Self-pay | Admitting: Vascular Surgery

## 2011-09-18 ENCOUNTER — Ambulatory Visit (INDEPENDENT_AMBULATORY_CARE_PROVIDER_SITE_OTHER): Payer: Medicare Other | Admitting: Vascular Surgery

## 2011-09-18 ENCOUNTER — Ambulatory Visit
Admission: RE | Admit: 2011-09-18 | Discharge: 2011-09-18 | Disposition: A | Discharge status: Home / Self Care | Payer: Medicare Other | Source: Ambulatory Visit | Attending: Thoracic Diseases | Admitting: Thoracic Diseases

## 2011-09-18 VITALS — BP 117/72 | HR 84 | Resp 16 | Ht 74.0 in | Wt 294.0 lb

## 2011-09-18 DIAGNOSIS — I723 Aneurysm of iliac artery: Secondary | ICD-10-CM

## 2011-09-18 DIAGNOSIS — Z48812 Encounter for surgical aftercare following surgery on the circulatory system: Secondary | ICD-10-CM

## 2011-09-18 MED ORDER — IOHEXOL 350 MG/ML SOLN
100.0000 mL | Freq: Once | INTRAVENOUS | Status: AC | PRN
  Administered 2011-09-18: 100 mL via INTRAVENOUS

## 2011-09-18 NOTE — Addendum Note (Signed)
Addended by: Sharee Pimple on: 09/18/2011 01:29 PM   Modules accepted: Orders

## 2011-09-18 NOTE — Progress Notes (Signed)
[facility] [place]  Post-operative EVAR  History of Present Illness  [name] is a 61 y.o. male who presents post-op s/p EVAR (Date: 08/11/11).  Most recent CTA (Date: 09/18/11) demonstrates: no endoleak and shrinking CIA aneurysms.  The patient has had no back or abdominal pain.  Past Medical History, Past Surgical History, Social History, Family History, Medications, Allergies, and Review of Systems are unchanged from previous evaluation on 08/11/11.  Physical Examination  Filed Vitals:   09/18/11 1205  BP: 117/72  Pulse: 84  Resp: 16    Vascular: Vessel Right Left  Radial Palpable Palpable  Ulnary Palpable Palpable  Brachial Palpable Palpable  Carotid Palpable, without bruit Palpable, without bruit  Aorta Non-palpable N/A  Femoral Palpable Palpable  Popliteal Non-palpable Non-palpable  PT Palpable Palpable  DP Palpable Palpable   Gastrointestinal: soft, NTND, -G/R, - HSM, - masses, - CVAT B  Non-Invasive Vascular Imaging  CTA Abd & pelvis (Date: 09/18/11) Official read not complete  Based on my read of the CTA, the EVAR is in appropriate location without migration, no endoleaks, common iliac artery aneurysms appear smaller to my eyes, intact arterial perfusion throughout.  Left IIA remains embolized.  Medical Decision Making  [name] is a 61 y.o. male who presents s/p EVAR for large iliac aneurysms.  Pt is asymptomatic with decreasing B common iliac artery aneurysm size.  I discussed with the patient the importance of surveillance of the endograft.  The next endograft duplex with aortoiliac duplex will be scheduled for 6 months.  The next CT will be scheduled for 12 months.  The patient will follow up with [place] in 6 months with these studies.  Thank you for allowing [place] to participate in this patient's care.  Leonides Sake, MD [facility] [place] Office: [phone] Pager: [phone]

## 2011-09-24 ENCOUNTER — Ambulatory Visit (INDEPENDENT_AMBULATORY_CARE_PROVIDER_SITE_OTHER): Payer: Medicare Other | Admitting: Pharmacist

## 2011-09-24 DIAGNOSIS — I2699 Other pulmonary embolism without acute cor pulmonale: Secondary | ICD-10-CM

## 2011-09-24 DIAGNOSIS — Z86718 Personal history of other venous thrombosis and embolism: Secondary | ICD-10-CM

## 2011-09-24 DIAGNOSIS — Z7901 Long term (current) use of anticoagulants: Secondary | ICD-10-CM

## 2011-09-24 LAB — POCT INR: INR: 6.1

## 2011-09-24 LAB — PROTIME-INR: Prothrombin Time: 63.8 s (ref 10.2–12.4)

## 2011-10-01 ENCOUNTER — Ambulatory Visit (INDEPENDENT_AMBULATORY_CARE_PROVIDER_SITE_OTHER): Payer: Medicare Other | Admitting: General Practice

## 2011-10-01 DIAGNOSIS — Z86718 Personal history of other venous thrombosis and embolism: Secondary | ICD-10-CM

## 2011-10-01 DIAGNOSIS — I2699 Other pulmonary embolism without acute cor pulmonale: Secondary | ICD-10-CM

## 2011-10-01 DIAGNOSIS — Z7901 Long term (current) use of anticoagulants: Secondary | ICD-10-CM

## 2011-10-13 ENCOUNTER — Ambulatory Visit (INDEPENDENT_AMBULATORY_CARE_PROVIDER_SITE_OTHER): Payer: Medicare Other | Admitting: General Practice

## 2011-10-13 DIAGNOSIS — Z7901 Long term (current) use of anticoagulants: Secondary | ICD-10-CM

## 2011-10-13 DIAGNOSIS — Z86718 Personal history of other venous thrombosis and embolism: Secondary | ICD-10-CM

## 2011-10-13 DIAGNOSIS — I2699 Other pulmonary embolism without acute cor pulmonale: Secondary | ICD-10-CM

## 2011-10-13 LAB — POCT INR: INR: 2.7

## 2011-10-23 ENCOUNTER — Ambulatory Visit: Payer: Medicare Other | Admitting: Vascular Surgery

## 2011-10-29 ENCOUNTER — Encounter: Payer: Self-pay | Admitting: Vascular Surgery

## 2011-10-30 ENCOUNTER — Ambulatory Visit (INDEPENDENT_AMBULATORY_CARE_PROVIDER_SITE_OTHER): Payer: Medicare Other | Admitting: Vascular Surgery

## 2011-10-30 ENCOUNTER — Encounter: Payer: Self-pay | Admitting: Vascular Surgery

## 2011-10-30 VITALS — BP 135/79 | HR 60 | Resp 18 | Ht 74.0 in | Wt 302.3 lb

## 2011-10-30 DIAGNOSIS — B369 Superficial mycosis, unspecified: Secondary | ICD-10-CM

## 2011-10-30 DIAGNOSIS — B49 Unspecified mycosis: Secondary | ICD-10-CM

## 2011-10-30 DIAGNOSIS — I723 Aneurysm of iliac artery: Secondary | ICD-10-CM

## 2011-10-30 MED ORDER — CLOTRIMAZOLE 1 % EX CREA: TOPICAL_CREAM | Freq: Two times a day (BID) | CUTANEOUS | Status: DC

## 2011-10-30 NOTE — Progress Notes (Signed)
[facility] [place]  Established EVAR  History of Present Illness  [name] is a 61 y.o. ([birth year]) male who presents for routine follow up s/p EVAR (Date: 08/11/11) and L IIA embolization (07/30/11).  The patient has no had back or abdominal pain.  He notes a sensation of "heat" in both groins since the procedure.  He denies any fever or chills.  He denies any open wounds or drainage.  Past Medical History, Past Surgical History, Social History, Family History, Medications, Allergies, and Review of Systems are unchanged from previous evaluation on 09/18/11.  Physical Examination  Filed Vitals:   10/30/11 1355  BP: 135/79  Pulse: 60  Resp: 18  Height: 6\' 2"  (1.88 m)  Weight: 302 lb 4.8 oz (137.122 kg)  SpO2: 100%   Body mass index is 38.81 kg/(m^2).  General: A&O x 3, WD, morbidly obese  Eyes: PERRLA, EOMI  Pulmonary: Sym exp, good air movt, CTAB, no rales, rhonchi, & wheezing  Cardiac: RRR, Nl S1, S2, no Murmurs, rubs or gallops  Vascular: Vessel Right Left  Radial Palpable Palpable  Ulnar Palpable Palpable  Brachial Palpable Palpable  Carotid Palpable, without bruit Palpable, without bruit  Aorta Not palpable N/A  Femoral Palpable Palpable  Popliteal Not palpable Not palpable  PT Faintly Palpable Faintly Palpable  DP Faintly Palpable Faintly Palpable   Gastrointestinal: soft, NTND, -G/R, - HSM, - masses, - CVAT B, large pannus, tinea in dependent areas of pannus and groin, no bruit over the femoral arteries, no fluid collections palpable  Musculoskeletal: M/S 5/5 throughout , Extremities without ischemic changes   Neurologic: Pain and light touch intact in extremities , Motor exam as listed above  Medical Decision Making  [name] is a 61 y.o. male who presents with fungal infection in pannus and groin likely related to moisture build up due to large overhanging pannus   I gave the patient a prescription for Clotrimazole cream EX  Apply twice a day to affected areas.  He will follow up with his PCP for further evaluation in 4 weeks.  Thank you for allowing [place] to participate in this patient's care.  He will follow up with me at his regular schedule s/p EVAR appt.  Leonides Sake, MD [facility] [place] Office: [phone] Pager: [phone]  10/30/2011, 2:33 PM

## 2011-11-03 ENCOUNTER — Ambulatory Visit: Payer: Medicare Other

## 2011-11-03 ENCOUNTER — Ambulatory Visit (INDEPENDENT_AMBULATORY_CARE_PROVIDER_SITE_OTHER): Payer: Medicare Other | Admitting: General Practice

## 2011-11-03 ENCOUNTER — Ambulatory Visit (INDEPENDENT_AMBULATORY_CARE_PROVIDER_SITE_OTHER): Payer: Medicare Other

## 2011-11-03 ENCOUNTER — Other Ambulatory Visit: Payer: Self-pay | Admitting: General Practice

## 2011-11-03 DIAGNOSIS — Z86718 Personal history of other venous thrombosis and embolism: Secondary | ICD-10-CM

## 2011-11-03 DIAGNOSIS — Z23 Encounter for immunization: Secondary | ICD-10-CM

## 2011-11-03 DIAGNOSIS — I2699 Other pulmonary embolism without acute cor pulmonale: Secondary | ICD-10-CM

## 2011-11-03 DIAGNOSIS — Z7901 Long term (current) use of anticoagulants: Secondary | ICD-10-CM

## 2011-11-03 LAB — POCT INR: INR: 1.9

## 2011-11-03 MED ORDER — WARFARIN SODIUM 5 MG PO TABS: 5.0000 mg | ORAL_TABLET | Freq: Every day | ORAL | Status: DC

## 2011-11-13 ENCOUNTER — Other Ambulatory Visit: Payer: Self-pay

## 2011-11-13 NOTE — Telephone Encounter (Signed)
A user error has taken place: encounter opened in error, closed for administrative reasons.

## 2011-12-03 ENCOUNTER — Ambulatory Visit (INDEPENDENT_AMBULATORY_CARE_PROVIDER_SITE_OTHER): Payer: Medicare Other | Admitting: General Practice

## 2011-12-03 ENCOUNTER — Encounter: Payer: Self-pay | Admitting: Internal Medicine

## 2011-12-03 ENCOUNTER — Ambulatory Visit (INDEPENDENT_AMBULATORY_CARE_PROVIDER_SITE_OTHER): Payer: Medicare Other | Admitting: Internal Medicine

## 2011-12-03 VITALS — BP 130/80 | HR 66 | Temp 98.6°F | Ht 74.0 in | Wt 292.1 lb

## 2011-12-03 DIAGNOSIS — Z7901 Long term (current) use of anticoagulants: Secondary | ICD-10-CM

## 2011-12-03 DIAGNOSIS — Z86718 Personal history of other venous thrombosis and embolism: Secondary | ICD-10-CM

## 2011-12-03 DIAGNOSIS — F32A Depression, unspecified: Secondary | ICD-10-CM

## 2011-12-03 DIAGNOSIS — M25561 Pain in right knee: Secondary | ICD-10-CM | POA: Insufficient documentation

## 2011-12-03 DIAGNOSIS — M25569 Pain in unspecified knee: Secondary | ICD-10-CM

## 2011-12-03 DIAGNOSIS — F329 Major depressive disorder, single episode, unspecified: Secondary | ICD-10-CM

## 2011-12-03 DIAGNOSIS — I2699 Other pulmonary embolism without acute cor pulmonale: Secondary | ICD-10-CM

## 2011-12-03 DIAGNOSIS — I10 Essential (primary) hypertension: Secondary | ICD-10-CM

## 2011-12-03 NOTE — Patient Instructions (Addendum)
Continue all other medications as before You will be contacted regarding the referral for: MRI and orthopedic for the right knee, shooting for the 3rd wk of Dec 2013 per your request Please see the Coumadin clinic today

## 2011-12-03 NOTE — Assessment & Plan Note (Signed)
With large effusion, pain, crepitus, tender, and reduced ability to extend fully, suspect significant cartilage damage, prob medial, for MRI, pain control, refer orthopedic

## 2011-12-03 NOTE — Progress Notes (Deleted)
no

## 2011-12-03 NOTE — Assessment & Plan Note (Signed)
stable overall by hx and exam, most recent data reviewed with pt, and pt to continue medical treatment as before Lab Results  Component Value Date   WBC 8.1 08/12/2011   HGB 11.2* 08/12/2011   HCT 34.9* 08/12/2011   PLT 201 08/12/2011   GLUCOSE 98 08/12/2011   CHOL  Value: 194        ATP III CLASSIFICATION:  <200     mg/dL   Desirable  161-096  mg/dL   Borderline High  >=045    mg/dL   High 4/0/9811   TRIG 102 08/13/2006   HDL 28* 08/13/2006   LDLCALC  Value: 146        Total Cholesterol/HDL:CHD Risk Coronary Heart Disease Risk Table                     Men   Women  1/2 Average Risk   3.4   3.3* 08/13/2006   ALT 9 08/06/2011   AST 13 08/06/2011   NA 137 08/12/2011   K 4.2 08/12/2011   CL 105 08/12/2011   CREATININE 0.67 08/12/2011   BUN 11 08/12/2011   CO2 22 08/12/2011   TSH 2.159 05/14/2011   PSA 0.79 Test Methodology: [employer] PSA 08/16/2006   INR 2.7 12/03/2011

## 2011-12-03 NOTE — Assessment & Plan Note (Signed)
stable overall by hx and exam, most recent data reviewed with pt, and pt to continue medical treatment as before BP Readings from Last 3 Encounters:  12/03/11 130/80  10/30/11 135/79  09/18/11 117/72

## 2011-12-03 NOTE — Progress Notes (Signed)
Subjective:    Patient ID: [name], male    DOB: [birth year], 61 y.o.   MRN: [id number]  HPI  Here to f/u,  Has signficant past recent hx of RLE iliospoas hematoma, states numbness to the RLE significant now improved but unfortunately fell about June to right knee with persistent swelling, and overall pain seems worse now, and cannot fully extend the leg, all worse to stand.  No overt bleeding or bruising, due for coumadin check today.  Pt denies chest pain, increased sob or doe, wheezing, orthopnea, PND, increased LE swelling, palpitations, dizziness or syncope.   Pt denies polydipsia, polyuria.  Pt denies new neurological symptoms such as new headache, or facial or extremity weakness or numbness.  Denies worsening depressive symptoms, suicidal ideation, or panic Past Medical History  Diagnosis Date  . Hypertension   . Chronic pain   . Obesity   . DVT (deep venous thrombosis)   . Dysrhythmia     atrial fibrilation  . Shortness of breath   . Blood transfusion 05/07/2011     " no reaction "  . Arthritis   . Hyperlipemia   . Sleep apnea     not wearing cpap  . Prolonged QT interval 09/07/2011  . PAH (pulmonary artery hypertension) 09/07/2011  . Blood in stool   . Depression   . PVD, bilat iliac aneurysms 05/11/2011  . Pulmonary embolism, "Massive" in 2008 05/10/2011  . PAF, was to have DCCV 4/12 but was in NSR, now in and out of flutter 05/10/2011  . History of DVT (deep vein thrombosis) 05/07/2011  . Chronic anticoagulation, PE DVT PAF 05/10/2011  . Anemia, Hgb 7.1 on admission 05/07/2011  . Psoriasis 05/10/2011  . Morbid obesity 05/10/2011  . HTN (hypertension) 05/07/2011  . Dyslipidemia 05/10/2011   Past Surgical History  Procedure Date  . Knee surgery   . Esophagogastroduodenoscopy 05/08/2011    Procedure: ESOPHAGOGASTRODUODENOSCOPY (EGD);  Surgeon: Theda Belfast, MD;  Location: [facility] ENDOSCOPY;  Service: Endoscopy;  Laterality: N/A;  . Colonoscopy 05/11/2011    Procedure: COLONOSCOPY;   Surgeon: Charna Elizabeth, MD;  Location: [facility] ENDOSCOPY;  Service: Endoscopy;  Laterality: N/A;  . Colonoscopy 05/12/2011    Procedure: COLONOSCOPY;  Surgeon: Theda Belfast, MD;  Location: [facility] ENDOSCOPY;  Service: Endoscopy;  Laterality: N/A;  may be cx abnormal ct  . Achilles tendon surgery   . Abdominal aortic evar 08/11/11    Stent Graft    reports that he has quit smoking. He has never used smokeless tobacco. He reports that he does not drink alcohol or use illicit drugs. family history includes Arthritis in his father and mother and Cancer in his mother. No Known Allergies Current Outpatient Prescriptions on File Prior to Visit  Medication Sig Dispense Refill  . allopurinol (ZYLOPRIM) 100 MG tablet Take 100 mg by mouth daily.      . clotrimazole (LOTRIMIN) 1 % cream Apply topically 2 (two) times daily.  30 g  1  . desonide (DESOWEN) 0.05 % cream Apply 1 application topically daily. Apply to rash on face      . desonide (DESOWEN) 0.05 % lotion       . digoxin (LANOXIN) 0.125 MG tablet Take 125 mcg by mouth daily. Hold if pulse less than 60.      . fluocinonide ointment (LIDEX) 0.05 % Apply 1 application topically 2 (two) times daily. Apply to dry skin areas on body      . metoprolol (LOPRESSOR) 50 MG tablet  Take 1 tablet (50 mg total) by mouth 2 (two) times daily.      . ondansetron (ZOFRAN-ODT) 8 MG disintegrating tablet Take 8 mg by mouth every 8 (eight) hours as needed.       [name] oxyCODONE-acetaminophen (PERCOCET) 10-325 MG per tablet Take 1 tablet by mouth every 6 (six) hours as needed. Pain.  20 tablet  0  . pravastatin (PRAVACHOL) 40 MG tablet Take 40 mg by mouth daily.      [name] TAZTIA XT 360 MG 24 hr capsule Take 360 mg by mouth daily.       [name] triamcinolone ointment (KENALOG) 0.1 % Apply 1 application topically daily. Apply to skin rash areas on body      . warfarin (COUMADIN) 5 MG tablet Take 1 tablet (5 mg total) by mouth daily. Take as directed by coumadin clinic.  52 tablet  3  .  zolpidem (AMBIEN) 5 MG tablet Take 5 mg by mouth at bedtime as needed.        Review of Systems  Constitutional: Negative for diaphoresis and unexpected weight change.  HENT: Negative for tinnitus.   Eyes: Negative for photophobia and visual disturbance.  Respiratory: Negative for choking and stridor.   Gastrointestinal: Negative for vomiting and blood in stool.  Genitourinary: Negative for hematuria and decreased urine volume.  Musculoskeletal: Negative for gait problem.  Skin: Negative for color change and wound.  Neurological: Negative for tremors and numbness.  Psychiatric/Behavioral: Negative for decreased concentration. The patient is not hyperactive.       Objective:   Physical Exam BP 130/80  Pulse 66  Temp 98.6 F (37 C) (Oral)  Ht 6\' 2"  (1.88 m)  Wt 292 lb 2 oz (132.507 kg)  BMI 37.51 kg/m2  SpO2 97% Physical Exam  VS noted, not ill appaering Constitutional: Pt appears well-developed and well-nourished.  HENT: Head: Normocephalic.  Right Ear: External ear normal.  Left Ear: External ear normal.  Eyes: Conjunctivae and EOM are normal. Pupils are equal, round, and reactive to light.  Neck: Normal range of motion. Neck supple.  Cardiovascular: Normal rate and regular rhythm.   Pulmonary/Chest: Effort normal and breath sounds normal.  Right knee with 2+ effusion, diffuse anterior mild tender, severe crepitus, full flexion but extension limited by approx 15 degrees. Neurological: Pt is alert. Not confused  Skin: Skin is warm. No erythema.  Psychiatric: Pt behavior is normal. Thought content normal. not depressed affect    Assessment & Plan:

## 2012-01-01 ENCOUNTER — Ambulatory Visit (INDEPENDENT_AMBULATORY_CARE_PROVIDER_SITE_OTHER): Payer: Medicare Other | Admitting: General Practice

## 2012-01-01 DIAGNOSIS — Z86718 Personal history of other venous thrombosis and embolism: Secondary | ICD-10-CM

## 2012-01-01 DIAGNOSIS — Z7901 Long term (current) use of anticoagulants: Secondary | ICD-10-CM

## 2012-01-01 DIAGNOSIS — I2699 Other pulmonary embolism without acute cor pulmonale: Secondary | ICD-10-CM

## 2012-01-01 LAB — POCT INR: INR: 2.4

## 2012-01-04 ENCOUNTER — Ambulatory Visit
Admission: RE | Admit: 2012-01-04 | Discharge: 2012-01-04 | Disposition: A | Discharge status: Home / Self Care | Payer: Medicare Other | Source: Ambulatory Visit | Attending: Internal Medicine | Admitting: Internal Medicine

## 2012-01-04 ENCOUNTER — Encounter: Payer: Self-pay | Admitting: Internal Medicine

## 2012-01-04 DIAGNOSIS — M25561 Pain in right knee: Secondary | ICD-10-CM

## 2012-01-29 ENCOUNTER — Ambulatory Visit (INDEPENDENT_AMBULATORY_CARE_PROVIDER_SITE_OTHER): Payer: Medicare Other | Admitting: General Practice

## 2012-01-29 DIAGNOSIS — I2699 Other pulmonary embolism without acute cor pulmonale: Secondary | ICD-10-CM

## 2012-01-29 DIAGNOSIS — Z86718 Personal history of other venous thrombosis and embolism: Secondary | ICD-10-CM

## 2012-01-29 DIAGNOSIS — Z7901 Long term (current) use of anticoagulants: Secondary | ICD-10-CM

## 2012-01-29 LAB — POCT INR: INR: 2.4

## 2012-03-08 ENCOUNTER — Ambulatory Visit (INDEPENDENT_AMBULATORY_CARE_PROVIDER_SITE_OTHER): Payer: Medicare Other | Admitting: Internal Medicine

## 2012-03-08 ENCOUNTER — Ambulatory Visit (INDEPENDENT_AMBULATORY_CARE_PROVIDER_SITE_OTHER): Payer: Medicare Other | Admitting: General Practice

## 2012-03-08 ENCOUNTER — Encounter: Payer: Self-pay | Admitting: Internal Medicine

## 2012-03-08 ENCOUNTER — Other Ambulatory Visit (INDEPENDENT_AMBULATORY_CARE_PROVIDER_SITE_OTHER): Payer: Medicare Other

## 2012-03-08 VITALS — BP 130/80 | HR 71 | Temp 97.9°F | Ht 74.0 in | Wt 308.4 lb

## 2012-03-08 LAB — URINALYSIS, ROUTINE W REFLEX MICROSCOPIC
Specific Gravity, Urine: 1.025 (ref 1.000–1.030)
Urine Glucose: NEGATIVE
Urobilinogen, UA: 0.2 (ref 0.0–1.0)
pH: 6 (ref 5.0–8.0)

## 2012-03-08 LAB — POCT INR: INR: 2.4

## 2012-03-08 LAB — CBC WITH DIFFERENTIAL/PLATELET
Basophils Absolute: 0 10*3/uL (ref 0.0–0.1)
Basophils Relative: 0 % (ref 0.0–3.0)
Eosinophils Relative: 2.6 % (ref 0.0–5.0)
HCT: 44.6 % (ref 39.0–52.0)
Hemoglobin: 14.8 g/dL (ref 13.0–17.0)
Lymphocytes Relative: 17.8 % (ref 12.0–46.0)
Lymphs Abs: 1 10*3/uL (ref 0.7–4.0)
Monocytes Relative: 6.4 % (ref 3.0–12.0)
Neutro Abs: 4.1 10*3/uL (ref 1.4–7.7)
RBC: 4.71 Mil/uL (ref 4.22–5.81)
RDW: 15.2 % — ABNORMAL HIGH (ref 11.5–14.6)
WBC: 5.6 10*3/uL (ref 4.5–10.5)

## 2012-03-08 LAB — LIPID PANEL
HDL: 48 mg/dL (ref >39.00)
Triglycerides: 82 mg/dL (ref 0.0–149.0)
VLDL: 16.4 mg/dL (ref 0.0–40.0)

## 2012-03-08 LAB — BASIC METABOLIC PANEL
BUN: 10 mg/dL (ref 6–23)
CO2: 27 mEq/L (ref 19–32)
Calcium: 9.1 mg/dL (ref 8.4–10.5)
Creatinine, Ser: 0.9 mg/dL (ref 0.4–1.5)
GFR: 112.84 mL/min (ref >60.00)
Glucose, Bld: 94 mg/dL (ref 70–99)
Sodium: 140 mEq/L (ref 135–145)

## 2012-03-08 LAB — HEPATIC FUNCTION PANEL
Albumin: 3.9 g/dL (ref 3.5–5.2)
Total Protein: 8 g/dL (ref 6.0–8.3)

## 2012-03-08 LAB — PSA: PSA: 0.47 ng/mL (ref 0.10–4.00)

## 2012-03-08 MED ORDER — METOPROLOL TARTRATE 50 MG PO TABS: 50.0000 mg | ORAL_TABLET | Freq: Two times a day (BID) | ORAL | Status: DC

## 2012-03-08 MED ORDER — OXYCODONE-ACETAMINOPHEN 10-325 MG PO TABS: 1.0000 | ORAL_TABLET | Freq: Four times a day (QID) | ORAL | Status: DC | PRN

## 2012-03-08 NOTE — Patient Instructions (Addendum)
Please call your insurance about the shingles shot coverage, and if covered please make Nurse Appt to get the shot Please go to the LAB in the Basement (turn left off the elevator) for the tests to be done today Please continue all other medications as before, and refills have been done if requested. Please have the pharmacy call with any other refills you may need. Please continue your efforts at being more active, low cholesterol diet, and weight control. You are otherwise up to date with prevention measures today. You will be contacted by phone if any changes need to be made immediately.  Otherwise, you will receive a letter about your results with an explanation Please remember to sign up for My Chart if you have not done so, as this will be important to you in the future with finding out test results, communicating by private email, and scheduling acute appointments online when needed. Please return in 6 months, or sooner if needed

## 2012-03-08 NOTE — Progress Notes (Signed)
Subjective:    Patient ID: [name], male    DOB: February 05, 1950, 62 y.o.   MRN: [id number]  HPI  Here for wellness and f/u;  Overall doing ok;  Pt denies CP, worsening SOB, DOE, wheezing, orthopnea, PND, worsening LE edema, palpitations, dizziness or syncope.  Pt denies neurological change such as new headache, facial or extremity weakness.  Pt denies polydipsia, polyuria, or low sugar symptoms. Pt states overall good compliance with treatment and medications, good tolerability, and has been trying to follow lower cholesterol diet.  Pt denies worsening depressive symptoms, suicidal ideation or panic. No fever, night sweats, wt loss, loss of appetite, or other constitutional symptoms.  Pt states good ability with ADL's, has low fall risk, home safety reviewed and adequate, no other significant changes in hearing or vision, and only occasionally active with exercise.  Gained 9 lbs since last visit with diet excess.  Trying to minimize his pain med use due to constipation.   Past Medical History  Diagnosis Date  . Hypertension   . Chronic pain   . Obesity   . DVT (deep venous thrombosis)   . Dysrhythmia     atrial fibrilation  . Shortness of breath   . Blood transfusion 05/07/2011     " no reaction "  . Arthritis   . Hyperlipemia   . Sleep apnea     not wearing cpap  . Prolonged QT interval 09/07/2011  . PAH (pulmonary artery hypertension) 09/07/2011  . Blood in stool   . Depression   . PVD, bilat iliac aneurysms 05/11/2011  . Pulmonary embolism, "Massive" in 2008 05/10/2011  . PAF, was to have DCCV 4/12 but was in NSR, now in and out of flutter 05/10/2011  . History of DVT (deep vein thrombosis) 05/07/2011  . Chronic anticoagulation, PE DVT PAF 05/10/2011  . Anemia, Hgb 7.1 on admission 05/07/2011  . Psoriasis 05/10/2011  . Morbid obesity 05/10/2011  . HTN (hypertension) 05/07/2011  . Dyslipidemia 05/10/2011   Past Surgical History  Procedure Laterality Date  . Knee surgery    .  Esophagogastroduodenoscopy  05/08/2011    Procedure: ESOPHAGOGASTRODUODENOSCOPY (EGD);  Surgeon: Theda Belfast, MD;  Location: [facility] ENDOSCOPY;  Service: Endoscopy;  Laterality: N/A;  . Colonoscopy  05/11/2011    Procedure: COLONOSCOPY;  Surgeon: Charna Elizabeth, MD;  Location: [facility] ENDOSCOPY;  Service: Endoscopy;  Laterality: N/A;  . Colonoscopy  05/12/2011    Procedure: COLONOSCOPY;  Surgeon: Theda Belfast, MD;  Location: [facility] ENDOSCOPY;  Service: Endoscopy;  Laterality: N/A;  may be cx abnormal ct  . Achilles tendon surgery    . Abdominal aortic evar  08/11/11    Stent Graft    reports that he has quit smoking. He has never used smokeless tobacco. He reports that he does not drink alcohol or use illicit drugs. family history includes Arthritis in his father and mother and Cancer in his mother. No Known Allergies Current Outpatient Prescriptions on File Prior to Visit  Medication Sig Dispense Refill  . allopurinol (ZYLOPRIM) 100 MG tablet Take 100 mg by mouth daily.      . clotrimazole (LOTRIMIN) 1 % cream Apply topically 2 (two) times daily.  30 g  1  . desonide (DESOWEN) 0.05 % cream Apply 1 application topically daily. Apply to rash on face      . desonide (DESOWEN) 0.05 % lotion       . digoxin (LANOXIN) 0.125 MG tablet Take 125 mcg by mouth daily. Hold if  pulse less than 60.      . fluocinonide ointment (LIDEX) 0.05 % Apply 1 application topically 2 (two) times daily. Apply to dry skin areas on body      . ondansetron (ZOFRAN-ODT) 8 MG disintegrating tablet Take 8 mg by mouth every 8 (eight) hours as needed.       . pravastatin (PRAVACHOL) 40 MG tablet Take 40 mg by mouth daily.      [name] TAZTIA XT 360 MG 24 hr capsule Take 360 mg by mouth daily.       [name] triamcinolone ointment (KENALOG) 0.1 % Apply 1 application topically daily. Apply to skin rash areas on body      . warfarin (COUMADIN) 5 MG tablet Take 1 tablet (5 mg total) by mouth daily. Take as directed by coumadin clinic.  52 tablet  3  .  zolpidem (AMBIEN) 5 MG tablet Take 5 mg by mouth at bedtime as needed.        No current facility-administered medications on file prior to visit.    Review of Systems Constitutional: Negative for diaphoresis, activity change, appetite change or unexpected weight change.  HENT: Negative for hearing loss, ear pain, facial swelling, mouth sores and neck stiffness.   Eyes: Negative for pain, redness and visual disturbance.  Respiratory: Negative for shortness of breath and wheezing.   Cardiovascular: Negative for chest pain and palpitations.  Gastrointestinal: Negative for diarrhea, blood in stool, abdominal distention or other pain Genitourinary: Negative for hematuria, flank pain or change in urine volume.  Musculoskeletal: Negative for myalgias and joint swelling.  Skin: Negative for color change and wound.  Neurological: Negative for syncope and numbness. other than noted Hematological: Negative for adenopathy.  Psychiatric/Behavioral: Negative for hallucinations, self-injury, decreased concentration and agitation.      Objective:   Physical Exam BP 130/80  Pulse 71  Temp(Src) 97.9 F (36.6 C) (Oral)  Ht 6\' 2"  (1.88 m)  Wt 308 lb 6 oz (139.878 kg)  BMI 39.58 kg/m2  SpO2 95% VS noted,  Constitutional: Pt is oriented to person, place, and time. Appears well-developed and well-nourished.  Head: Normocephalic and atraumatic.  Right Ear: External ear normal.  Left Ear: External ear normal.  Nose: Nose normal.  Mouth/Throat: Oropharynx is clear and moist.  Eyes: Conjunctivae and EOM are normal. Pupils are equal, round, and reactive to light.  Neck: Normal range of motion. Neck supple. No JVD present. No tracheal deviation present.  Cardiovascular: Normal rate, irregular rhythm, normal heart sounds and intact distal pulses.   Pulmonary/Chest: Effort normal and breath sounds normal.  Abdominal: Soft. Bowel sounds are normal. There is no tenderness. No HSM  Musculoskeletal: Normal  range of motion. Exhibits no edema.  Lymphadenopathy:  Has no cervical adenopathy.  Neurological: Pt is alert and oriented to person, place, and time. Pt has normal reflexes. No cranial nerve deficit.  Skin: Skin is warm and dry. No rash noted.  Psychiatric:  Has  normal mood and affect. Behavior is normal.     Assessment & Plan:

## 2012-03-08 NOTE — Assessment & Plan Note (Signed)

## 2012-03-10 ENCOUNTER — Ambulatory Visit: Payer: Medicare Other | Admitting: Internal Medicine

## 2012-03-18 ENCOUNTER — Other Ambulatory Visit: Payer: Medicare Other

## 2012-03-18 ENCOUNTER — Ambulatory Visit: Payer: Medicare Other | Admitting: Vascular Surgery

## 2012-03-28 ENCOUNTER — Telehealth: Payer: Self-pay | Admitting: Internal Medicine

## 2012-03-28 NOTE — Telephone Encounter (Signed)
Patient Information:  Caller Name: [name]  Phone: [phone]  Patient: [name], [name]  Gender: Male  DOB: [birth year]  Age: 62 Years  PCP: Oliver Barre (Adults only)  Office Follow Up:  Does the office need to follow up with this patient?: Yes  Instructions For The Office: Requesting quantity of tablets of Warfarin 5mg  on each refill be increased. States he used to get 100 tabs. States he is now is receiving 30 tabs per month which is not enough for 30 days. Takes Warfarin 5 mg - two tabs on, Sun, Mon, Weds, Fri.  Then on Tues, Thurs, Sat takes one and one-half tabs. Uses [address] [phone]   Symptoms  Reason For Call & Symptoms: Kamryn states he needs  amount of tablets increased  on refills on Warfarin 5 mg po- takes 2 (two) on Sun, Mon, Weds,Fri. Takes 1.5 tabs ( one and one-half) on Tues, Thurs, Sat. States he used to receive 100 tabs. Now receiving Warfarin 5 mg -30 tabs per month which is not enough- lasting about 22 days. Please call in to [facility] [phone].  Reviewed Health History In EMR: Yes  Reviewed Medications In EMR: Yes  Reviewed Allergies In EMR: Yes  Reviewed Surgeries / Procedures: Yes  Date of Onset of Symptoms: Unknown  Guideline(s) Used:  No Protocol Available - Information Only  Disposition Per Guideline:   Discuss with PCP and Callback by Nurse Today  Reason For Disposition Reached:   Nursing judgment  Advice Given:  Call Back If:  New symptoms develop  Patient Will Follow Care Advice:  YES

## 2012-03-28 NOTE — Telephone Encounter (Signed)
Unfortunately, I have been asked by the coumadin clinic to leave the prescribing to them  Please ask pt to direct his question there, or forward the message.  thanks

## 2012-03-29 ENCOUNTER — Other Ambulatory Visit: Payer: Self-pay | Admitting: General Practice

## 2012-03-29 MED ORDER — WARFARIN SODIUM 5 MG PO TABS: 5.0000 mg | ORAL_TABLET | Freq: Every day | ORAL | Status: DC

## 2012-04-07 ENCOUNTER — Encounter: Payer: Self-pay | Admitting: Vascular Surgery

## 2012-04-08 ENCOUNTER — Encounter: Payer: Self-pay | Admitting: Vascular Surgery

## 2012-04-08 ENCOUNTER — Ambulatory Visit (INDEPENDENT_AMBULATORY_CARE_PROVIDER_SITE_OTHER): Payer: Medicare Other | Admitting: Vascular Surgery

## 2012-04-08 ENCOUNTER — Other Ambulatory Visit (INDEPENDENT_AMBULATORY_CARE_PROVIDER_SITE_OTHER): Payer: Medicare Other | Admitting: *Deleted

## 2012-04-08 VITALS — BP 145/83 | HR 62 | Ht 74.0 in | Wt 308.0 lb

## 2012-04-08 DIAGNOSIS — I714 Abdominal aortic aneurysm, without rupture: Secondary | ICD-10-CM

## 2012-04-08 DIAGNOSIS — Z48812 Encounter for surgical aftercare following surgery on the circulatory system: Secondary | ICD-10-CM

## 2012-04-08 DIAGNOSIS — I723 Aneurysm of iliac artery: Secondary | ICD-10-CM

## 2012-04-08 NOTE — Progress Notes (Signed)
[facility] [place]  Established EVAR  History of Present Illness  BILLY TURVEY is a 62 y.o. (09-19-1950) male who presents for routine follow up s/p EVAR (Date: 08/11/11) and L IIA emb. (Date: 07/30/11) for B CIA aneurysms.  Most recent EVAR duplex (Date: 04/09/11) demonstrates: no endoleak and shrinking iliac aneurysm sac sizes. Most recent CT (Date: 09/18/11) demonstrates: no endoleak and decreasing iliac sac sizes.  The patient has not had back or abdominal pain.  The patient denies any buttock claudication, hematochezia, melena, or subsequent ED from IIA embolization.  Past Medical History, Past Surgical History, Social History, Family History, Medications, Allergies, and Review of Systems are unchanged from previous evaluation on 10/30/11.  Physical Examination  Filed Vitals:   04/08/12 0915  BP: 145/83  Pulse: 62  Height: 6\' 2"  (1.88 m)  Weight: 308 lb (139.708 kg)  SpO2: 100%   Body mass index is 39.53 kg/(m^2).  General: A&O x 3, WD, morbidly obese  Eyes: PERRLA, EOMI  Pulmonary: Sym exp, good air movt, CTAB, no rales, rhonchi, & wheezing  Cardiac: RRR, Nl S1, S2, no Murmurs, rubs or gallops  Vascular: faintly palpable pedal pulses  Gastrointestinal: soft, NTND, -G/R, - HSM, - masses, - CVAT B, obese, cannot palpate AAA  Musculoskeletal: M/S 5/5 throughout , Extremities without ischemic changes   Neurologic: Pain and light touch intact in extremities , Motor exam as listed above  Non-Invasive Vascular Imaging  EVAR Duplex (Date: 04/08/12)  Technically limited study due to gas and habitus  R CIA: 1.97 cm  L CIA 2.80 c/s  No endoleak  Medical Decision Making  CLIFORD SEQUEIRA is a 62 y.o. male who presents s/p EVAR and L IIA embolization.     Pt is asymptomatic with decreasing bilateral iliac aneurysm sac size.  I suspect the aortic measurement is incorrect as the patient's aorta was within normal limits when he was repaired.  I  discussed with the patient the importance of surveillance of the endograft.  The next CTA will be scheduled for 6 months.  The patient will follow up with [place] in 6 months with this study.  Thank you for allowing [place] to participate in this patient's care.  Leonides Sake, MD [facility] [place] Office: [phone] Pager: [phone]  04/08/2012, 9:57 AM

## 2012-04-08 NOTE — Addendum Note (Signed)
Addended by: Adria Dill L on: 04/08/2012 03:24 PM   Modules accepted: Orders

## 2012-04-19 ENCOUNTER — Ambulatory Visit (INDEPENDENT_AMBULATORY_CARE_PROVIDER_SITE_OTHER): Payer: Medicare Other | Admitting: General Practice

## 2012-04-19 DIAGNOSIS — Z7901 Long term (current) use of anticoagulants: Secondary | ICD-10-CM

## 2012-04-19 DIAGNOSIS — I2699 Other pulmonary embolism without acute cor pulmonale: Secondary | ICD-10-CM

## 2012-04-19 DIAGNOSIS — Z86718 Personal history of other venous thrombosis and embolism: Secondary | ICD-10-CM

## 2012-05-24 ENCOUNTER — Other Ambulatory Visit: Payer: Self-pay

## 2012-05-24 MED ORDER — DIGOXIN 125 MCG PO TABS: 125.0000 ug | ORAL_TABLET | Freq: Every day | ORAL | Status: DC

## 2012-05-31 ENCOUNTER — Ambulatory Visit (INDEPENDENT_AMBULATORY_CARE_PROVIDER_SITE_OTHER): Payer: Medicare Other | Admitting: General Practice

## 2012-05-31 DIAGNOSIS — Z86718 Personal history of other venous thrombosis and embolism: Secondary | ICD-10-CM

## 2012-05-31 DIAGNOSIS — Z7901 Long term (current) use of anticoagulants: Secondary | ICD-10-CM

## 2012-05-31 DIAGNOSIS — I2699 Other pulmonary embolism without acute cor pulmonale: Secondary | ICD-10-CM

## 2012-05-31 LAB — POCT INR: INR: 2.6

## 2012-07-03 ENCOUNTER — Other Ambulatory Visit: Payer: Self-pay | Admitting: Internal Medicine

## 2012-07-06 ENCOUNTER — Telehealth: Payer: Self-pay

## 2012-07-06 MED ORDER — DILTIAZEM HCL ER BEADS 360 MG PO CP24: 360.0000 mg | ORAL_CAPSULE | Freq: Every day | ORAL | Status: DC

## 2012-07-06 NOTE — Telephone Encounter (Signed)
refill 

## 2012-07-12 ENCOUNTER — Ambulatory Visit (INDEPENDENT_AMBULATORY_CARE_PROVIDER_SITE_OTHER): Payer: Medicare Other | Admitting: General Practice

## 2012-07-12 DIAGNOSIS — Z86718 Personal history of other venous thrombosis and embolism: Secondary | ICD-10-CM

## 2012-07-12 DIAGNOSIS — Z7901 Long term (current) use of anticoagulants: Secondary | ICD-10-CM

## 2012-07-12 DIAGNOSIS — I2699 Other pulmonary embolism without acute cor pulmonale: Secondary | ICD-10-CM

## 2012-07-12 LAB — POCT INR: INR: 2.2

## 2012-08-07 ENCOUNTER — Other Ambulatory Visit: Payer: Self-pay | Admitting: Internal Medicine

## 2012-08-08 ENCOUNTER — Other Ambulatory Visit: Payer: Self-pay | Admitting: General Practice

## 2012-08-08 MED ORDER — WARFARIN SODIUM 5 MG PO TABS: ORAL_TABLET | ORAL | Status: DC

## 2012-08-23 ENCOUNTER — Ambulatory Visit (INDEPENDENT_AMBULATORY_CARE_PROVIDER_SITE_OTHER): Payer: Medicare Other | Admitting: General Practice

## 2012-08-23 DIAGNOSIS — Z86718 Personal history of other venous thrombosis and embolism: Secondary | ICD-10-CM

## 2012-08-23 DIAGNOSIS — I2699 Other pulmonary embolism without acute cor pulmonale: Secondary | ICD-10-CM

## 2012-08-23 DIAGNOSIS — Z7901 Long term (current) use of anticoagulants: Secondary | ICD-10-CM

## 2012-08-23 LAB — POCT INR: INR: 3.5

## 2012-09-20 ENCOUNTER — Ambulatory Visit (INDEPENDENT_AMBULATORY_CARE_PROVIDER_SITE_OTHER): Payer: Medicare Other | Admitting: General Practice

## 2012-09-20 DIAGNOSIS — Z86718 Personal history of other venous thrombosis and embolism: Secondary | ICD-10-CM

## 2012-09-20 DIAGNOSIS — Z7901 Long term (current) use of anticoagulants: Secondary | ICD-10-CM

## 2012-09-20 DIAGNOSIS — I2699 Other pulmonary embolism without acute cor pulmonale: Secondary | ICD-10-CM

## 2012-09-20 LAB — POCT INR: INR: 2.1

## 2012-09-21 ENCOUNTER — Encounter: Payer: Self-pay | Admitting: Cardiology

## 2012-09-22 ENCOUNTER — Encounter: Payer: Self-pay | Admitting: Cardiology

## 2012-09-22 ENCOUNTER — Ambulatory Visit (INDEPENDENT_AMBULATORY_CARE_PROVIDER_SITE_OTHER): Payer: Medicare Other | Admitting: Cardiology

## 2012-09-22 VITALS — BP 110/70 | HR 54 | Ht 74.0 in | Wt 315.2 lb

## 2012-09-22 DIAGNOSIS — I2789 Other specified pulmonary heart diseases: Secondary | ICD-10-CM

## 2012-09-22 DIAGNOSIS — I2699 Other pulmonary embolism without acute cor pulmonale: Secondary | ICD-10-CM

## 2012-09-22 DIAGNOSIS — I723 Aneurysm of iliac artery: Secondary | ICD-10-CM

## 2012-09-22 DIAGNOSIS — Z7901 Long term (current) use of anticoagulants: Secondary | ICD-10-CM

## 2012-09-22 DIAGNOSIS — I739 Peripheral vascular disease, unspecified: Secondary | ICD-10-CM

## 2012-09-22 DIAGNOSIS — I480 Paroxysmal atrial fibrillation: Secondary | ICD-10-CM

## 2012-09-22 DIAGNOSIS — I10 Essential (primary) hypertension: Secondary | ICD-10-CM

## 2012-09-22 DIAGNOSIS — E785 Hyperlipidemia, unspecified: Secondary | ICD-10-CM

## 2012-09-22 DIAGNOSIS — I2721 Secondary pulmonary arterial hypertension: Secondary | ICD-10-CM

## 2012-09-22 DIAGNOSIS — G4733 Obstructive sleep apnea (adult) (pediatric): Secondary | ICD-10-CM

## 2012-09-22 DIAGNOSIS — I4891 Unspecified atrial fibrillation: Secondary | ICD-10-CM

## 2012-09-22 NOTE — Patient Instructions (Signed)
Overall you seem very well.  Her heart rate and blood pressure looks great.  Her cholesterol last spring looked very good.  My only concern is that you need to watch her weight gained 7 pounds since last visit. Keep working on your diet and exercise acute we talked about.  I will see you back in a year.  Continue to followup with Dr. Jonny Ruiz.  Marykay Lex, MD  Your physician wants you to follow-up in: 1 year You will receive a reminder letter in the mail two months in advance. If you don't receive a letter, please call our office to schedule the follow-up appointment.

## 2012-09-22 NOTE — Progress Notes (Signed)
Patient ID: [name], male   DOB: April 05, 1950, 62 y.o.   MRN: [id number] PCP: Oliver Barre, MD  Clinic Note: Chief Complaint  Patient presents with  . Annual Exam    no chest pain , no sob, no edema   HPI: [name] is a 62 y.o. male with a PMH below who presents today for annual followup of his atrial flutter and other cardiac risk factors. As you recall he has a history of recurrent atrial flutter with variable block.  He also had a history of large massive PE in 2008 and has been on warfarin ever since.  July 2013 he was diagnosed with a abdominal aortic aneurysm treated with EVAR  by Dr. Imogene Burn and Dr. Edilia Bo.  He has morbid obesity and was diagnosed with sleep apnea shortly after his AAA repair.  He is currently on CPAP and doing well with it.  Interval History: He is doing well a major complaints.  A couple episodes of what he describes panic attacks but otherwise is doing fine since his last visit.  He notes he is not been exercising as well as he should.  He cannot get off his exercise routine.  He the summertime.  He is now planning to sign up with with [employer].  He denies any symptoms of chest pain with or shortness of breath at rest or exertion.  The remainder of Cardiovascular ROS: negative for - edema, irregular heartbeat, loss of consciousness, murmur, orthopnea, palpitations, paroxysmal nocturnal dyspnea, rapid heart rate or shortness of breath is as follows: Additional cardiac review of systems: Lightheadedness - no, dizziness - no, syncope/near-syncope - no; TIA/amaurosis fugax - no Melena - no, hematochezia no; hematuria - no; nosebleeds - no; claudication - no  Past Medical History  Diagnosis Date  . Hypertension   . Hyperlipemia   . Obstructive sleep apnea July 2013    Not wearing CPAP. Split-Night sleep study 06/03/11 AHI = 27.43/hr (TOTAL SLEEP TIME 105 minutes) and REM AHI = 120.00/hr  . Morbid obesity with BMI of 40.0-44.9, adult   . PAH (pulmonary  artery hypertension)   . Pulmonary embolism 2008    DVT --> Massive PE with right-sided strain. On coumadin. ECHO 02/28/10 showed normal LV size, moderate concentricLVH, normal EF, normal atrial sizes, RV was normal but previously dilated.  . Chronic anticoagulation, PE DVT PAF   . Morbid obesity   . HTN (hypertension)   . Dyslipidemia   . PAF (paroxysmal atrial fibrillation)/atrial flutter 02/28/2010    ECHO : normal LV size, moderate concentricLVH, normal EF, normal atrial sizes, RV was normal but previously dilated;;  Myoview : Normal perfusion study. Low risk scan.  . Prolonged QT interval   . Edema of left lower extremity     S/P I&D 08/30/06  . GI bleed 04/2011    With anemia. EGD & colonoscopy 05/11/2011  . Anemia     From GI bleed in 05/11/11.   . Iliopsoas muscle hematoma 04/2011  . AAA (abdominal aortic aneurysm) July 2013    Abdominal Aortic Endovascular Stent Graft 08/11/11. Bilateral. Right is 3.2 & the left is 3.6. "Preclose" repair bilateral common femoral artery. Placement of catheter in aorta x 2. Repair of aorta w/modular bifurcated prosthesis w/bilateral limbs.  Placement of right limb extension: 23 mm x 12 cm. Placement of left limb extension: 14 mm x 7 cm.  . Iliac artery aneurysm, bilateral     PVD, bilat common iliac aneurysms [443.9]  .  Lung nodule 2008  . Depression   . Chronic pain   . Osteoarthritis of both knees     Bilateral Arthroscopy in 2000 & 2008. Both were complicated w/venous thromboembolism S/P IVC filter placement due to one being massive PE in 2008.  [name] Psoriasis   . History of alcohol dependence     Prior Cardiac Evaluation and Past Surgical History: Past Surgical History  Procedure Laterality Date  . Knee surgery  1999  . Esophagogastroduodenoscopy  05/08/2011    For GI bleed/Anemia. Also had a colonoscopy at this time.  . Colonoscopy  05/11/2011    For GI bleed. Performed by Dr. Charna Elizabeth, MD. Also had a EGD performed at this time.  .  Colonoscopy  05/12/2011  . Achilles tendon surgery    . Abdominal aortic evar  08/11/11    Bilateral. Right is 3.2 & the left is 3.6. "Preclose" repair bilateral common femoral artery. Placement of catheter in aorta x 2. Repair of aorta w/modular bifurcated prosthesis w/bilateral limbs.  Placement of right limb extension: 23 mm x 12 cm. Placement of left limb extension: 14 mm x 7 cm.  . Knee arthroscopy Bilateral 2000 & 2008    DJD knees. Both were complicated w/venous thromboembolism S/P IVC filter placement due to one being massive PE in 2008.   Allergies  Allergen Reactions  . Crestor [Rosuvastatin] Swelling    Current Outpatient Prescriptions  Medication Sig Dispense Refill  . allopurinol (ZYLOPRIM) 100 MG tablet Take 100 mg by mouth daily.      . clotrimazole (LOTRIMIN) 1 % cream Apply topically 2 (two) times daily.  30 g  1  . desonide (DESOWEN) 0.05 % cream Apply 1 application topically daily. Apply to rash on face      . desonide (DESOWEN) 0.05 % lotion       . digoxin (LANOXIN) 0.125 MG tablet Take 1 tablet (125 mcg total) by mouth daily. Hold if pulse less than 60.  30 tablet  8  . diltiazem (TAZTIA XT) 360 MG 24 hr capsule Take 1 capsule (360 mg total) by mouth daily.  30 capsule  8  . fluocinonide ointment (LIDEX) 0.05 % Apply 1 application topically 2 (two) times daily. Apply to dry skin areas on body      . metoprolol (LOPRESSOR) 50 MG tablet Take 1 tablet (50 mg total) by mouth 2 (two) times daily.  180 tablet  3  . ondansetron (ZOFRAN-ODT) 8 MG disintegrating tablet Take 8 mg by mouth every 8 (eight) hours as needed.       [name] oxyCODONE-acetaminophen (PERCOCET) 10-325 MG per tablet Take 1 tablet by mouth every 6 (six) hours as needed. Pain.  30 tablet  0  . pravastatin (PRAVACHOL) 40 MG tablet Take 40 mg by mouth daily.      [name] triamcinolone ointment (KENALOG) 0.1 % Apply 1 application topically daily. Apply to skin rash areas on body      . warfarin (COUMADIN) 5 MG tablet Take  as directed by coumadin clinic.  70 tablet  3  . zolpidem (AMBIEN) 5 MG tablet Take 5 mg by mouth at bedtime as needed.        No current facility-administered medications for this visit.   Myoview 02/28/10 Normal perfusion study. Low risk scan. History   Social History Narrative   He is a divorced father of one, grandfather 2. He tries to exercise, and had been doing it 6 days a week. He had a fall earlier  last year inserted got out of the habit. He also working on his diet and sort of got out of that habit as well. He did quit drinking a few years ago and does not smoke.    ROS: A comprehensive Review of Systems - Negative except Occasional anxiety, decreased exercise desire  PHYSICAL EXAM BP 110/70  Pulse 54  Ht 6\' 2"  (1.88 m)  Wt 315 lb 3.2 oz (142.974 kg)  BMI 40.45 kg/m2 General appearance: alert, cooperative, appears stated age, no distress, moderately obese and Due to his psoriasis, he is fully covered up.  He has a distinct body odor.  Otherwise is in good spirits with normal mood and affect. Neck: no adenopathy, no carotid bruit, supple, symmetrical, trachea midline and Unable to assess JVD due to body habitus. Lungs: clear to auscultation bilaterally, normal percussion bilaterally and Good air movement, nonlabored Heart: regular rate and rhythm, S1: normal, S2: physiologically split, no S3 or S4 and No rubs or gallops Abdomen: soft, non-tender; bowel sounds normal; no masses,  no organomegaly and Morbidly obese Extremities: extremities normal, atraumatic, no cyanosis or edema and varicose veins noted Pulses: 2+ and symmetric Neurologic: Alert and oriented X 3, normal strength and tone. Normal symmetric reflexes. Normal coordination and gait  [id number] today: Yes Rate: 54 , Rhythm:  Sinus bradycardia otherwise normal ECG.  Recent Labs: Lipid panel from February reviewed.  Total cholesterol 150, HDL 48, LDL 86, cholesterol is 82.  ASSESSMENT / PLAN: Intermittent PAF and  paroxysmal atrial flutter He seems to be stable in sinus rhythm with sinus bradycardia. Already anticoagulated because of the massive PE in the past. He is rate controlled with digoxin and diltiazem.   Plan: Continue with current meds.  Pulmonary embolism, "Massive" in 2008 Thankfully the repeat echocardiogram shows that the right ventricular size is gotten back to normal. I would be surprised if his pulmonary pressures have actually come down. Now has obesity and OSA with possible obesity hypoventilation syndrome leading to elevated pulmonary pressures.  Continue wearing CPAP and working on weight loss. Continue warfarin for DVT prophylaxis  PAH (pulmonary artery hypertension) I suspect it's actually improved after clearing the pulmonary embolus.  Iliac artery aneurysm I think he is really being followed by the vascular surgeons. Not sure when the most recent Dopplers were performed  HTN (hypertension) Stable. Continue current medications. Also monitored by Dr. Jonny Ruiz.  OSA on CPAP He needs to stay on CPAP as best as possible. Even if he is able to use it for half of the night, he would be improved overall because of it.  Morbid obesity Despite my counseling last year, he's actually gained 7 pounds since last visit. I spent at least 5 minutes about the importance of dietary modification and trying to pick up his exercise. I actually think he may benefit from nutrition counseling.  Dyslipidemia Currently on pravastatin, monitored by PCP; gold therapy to be LDL less than 70 based on the peripheral and central vascular disease as cardiac risk equivalence.  Chronic anticoagulation, PE DVT PAF Continue with goal INR of 2-3. With a history of atrial flutter plus rule out PE, a recommendation would be continued anticoagulation. Of course we can also consider a NOAC. With his history of GI bleed, but this would be preferable. Would then stop aspirin  PVD, bilat iliac aneurysms These are being  followed by the cardiac surgeons am not sure this Dr. Imogene Burn or Dr. Edilia Bo. The patient tells me scheduled to follow up with them  in a month.    Orders Placed This Encounter  Procedures  . EKG 12-Lead   No orders of the defined types were placed in this encounter.    Followup: 1 year  DAVID W. Herbie Baltimore, M.D., M.S. [facility] [address] [place], [place]  [address]  [phone] Pager # [phone]

## 2012-10-09 ENCOUNTER — Encounter: Payer: Self-pay | Admitting: Cardiology

## 2012-10-09 DIAGNOSIS — I723 Aneurysm of iliac artery: Secondary | ICD-10-CM | POA: Insufficient documentation

## 2012-10-09 NOTE — Assessment & Plan Note (Signed)
I think he is really being followed by the vascular surgeons. Not sure when the most recent Dopplers were performed

## 2012-10-09 NOTE — Assessment & Plan Note (Signed)
Stable. Continue current medications. Also monitored by Dr. Jonny Ruiz.

## 2012-10-09 NOTE — Assessment & Plan Note (Addendum)
Continue with goal INR of 2-3. With a history of atrial flutter plus rule out PE, a recommendation would be continued anticoagulation. Of course we can also consider a NOAC. With his history of GI bleed, but this would be preferable. Would then stop aspirin

## 2012-10-09 NOTE — Assessment & Plan Note (Signed)
He needs to stay on CPAP as best as possible. Even if he is able to use it for half of the night, he would be improved overall because of it.

## 2012-10-09 NOTE — Assessment & Plan Note (Signed)
I suspect it's actually improved after clearing the pulmonary embolus.

## 2012-10-09 NOTE — Assessment & Plan Note (Addendum)
Despite my counseling last year, he's actually gained 7 pounds since last visit. I spent at least 5 minutes about the importance of dietary modification and trying to pick up his exercise. I actually think he may benefit from nutrition counseling.

## 2012-10-09 NOTE — Assessment & Plan Note (Addendum)
Thankfully the repeat echocardiogram shows that the right ventricular size is gotten back to normal. I would be surprised if his pulmonary pressures have actually come down. Now has obesity and OSA with possible obesity hypoventilation syndrome leading to elevated pulmonary pressures.  Continue wearing CPAP and working on weight loss. Continue warfarin for DVT prophylaxis

## 2012-10-09 NOTE — Assessment & Plan Note (Addendum)
Currently on pravastatin, monitored by PCP; gold therapy to be LDL less than 70 based on the peripheral and central vascular disease as cardiac risk equivalence.

## 2012-10-09 NOTE — Assessment & Plan Note (Signed)
He seems to be stable in sinus rhythm with sinus bradycardia. Already anticoagulated because of the massive PE in the past. He is rate controlled with digoxin and diltiazem.   Plan: Continue with current meds.

## 2012-10-09 NOTE — Assessment & Plan Note (Signed)
These are being followed by the cardiac surgeons am not sure this Dr. Imogene Burn or Dr. Edilia Bo. The patient tells me scheduled to follow up with them in a month.

## 2012-10-13 ENCOUNTER — Other Ambulatory Visit: Payer: Self-pay | Admitting: Vascular Surgery

## 2012-10-13 ENCOUNTER — Encounter: Payer: Self-pay | Admitting: Vascular Surgery

## 2012-10-13 LAB — BUN+CREAT: BUN/Creatinine Ratio: 19.2 Ratio

## 2012-10-13 LAB — BUN: BUN: 25 mg/dL — ABNORMAL HIGH (ref 6–23)

## 2012-10-13 LAB — CREATININE, SERUM: Creat: 1.3 mg/dL (ref 0.50–1.35)

## 2012-10-14 ENCOUNTER — Ambulatory Visit
Admission: RE | Admit: 2012-10-14 | Discharge: 2012-10-14 | Disposition: A | Discharge status: Home / Self Care | Payer: Medicare Other | Source: Ambulatory Visit | Attending: Vascular Surgery | Admitting: Vascular Surgery

## 2012-10-14 ENCOUNTER — Encounter: Payer: Self-pay | Admitting: Vascular Surgery

## 2012-10-14 ENCOUNTER — Ambulatory Visit (INDEPENDENT_AMBULATORY_CARE_PROVIDER_SITE_OTHER): Payer: Medicare Other | Admitting: Vascular Surgery

## 2012-10-14 VITALS — BP 123/75 | HR 64 | Ht 74.0 in | Wt 312.0 lb

## 2012-10-14 DIAGNOSIS — I723 Aneurysm of iliac artery: Secondary | ICD-10-CM

## 2012-10-14 DIAGNOSIS — Z48812 Encounter for surgical aftercare following surgery on the circulatory system: Secondary | ICD-10-CM

## 2012-10-14 MED ORDER — IOHEXOL 350 MG/ML SOLN
100.0000 mL | Freq: Once | INTRAVENOUS | Status: AC | PRN
  Administered 2012-10-14: 100 mL via INTRAVENOUS

## 2012-10-14 NOTE — Progress Notes (Addendum)
[facility] [place]   Established EVAR   History of Present Illness  [name] is a 62 y.o. male who presents for routine follow up s/p EVAR (Date: 08/11/11) and L IIA emb. (Date: 07/30/11) for B CIA aneurysms. Most recent EVAR duplex (Date: 04/09/11) demonstrates: no endoleak and shrinking iliac aneurysm sac sizes. Most recent CT (Date: 10/14/2012) demonstrates: no endoleak and decreasing iliac sac sizes to limb size. The patient has not had back or abdominal pain. The patient denies any buttock claudication, hematochezia, melena, or subsequent ED from IIA embolization.    Past Medical History, Past Surgical History, Social History, Family History, Medications, Allergies, and Review of Systems are unchanged from previous evaluation on 04/08/12.  On ROS: no embolic sx, no back or abd pain  Physical Examination  Filed Vitals:   10/14/12 0911  BP: 123/75  Pulse: 64  Height: 6\' 2"  (1.88 m)  Weight: 312 lb (141.522 kg)  SpO2: 100%   Body mass index is 40.04 kg/(m^2).  General: A&O x 3, WD, morbidly obese   Eyes: PERRLA, EOMI   Pulmonary: Sym exp, good air movt, CTAB, no rales, rhonchi, & wheezing   Cardiac: RRR, Nl S1, S2, no Murmurs, rubs or gallops   Vascular: faintly palpable pedal pulses   Gastrointestinal: soft, NTND, -G/R, - HSM, - masses, - CVAT B, obese, cannot palpate AAA   Musculoskeletal: M/S 5/5 throughout , Extremities without ischemic changes   Neurologic: Pain and light touch intact in extremities , Motor exam as listed above   CTA Abd/Pelvis (10/14/2012)   Final read pending  Based on my review of this patient's CTA, he has a widely patent EVAR with B CIA iliac aneurysms that has shrunk to the size of his iliac limbs at this point without any evidence of endoleak.  Medical Decision Making  [name] is a 62 y.o. male who presents s/p EVAR and L IIA embolization.  Pt is asymptomatic with stable shrunked bilateral iliac aneurysm sacs.   At this this point, I would proceed with annual aortoiliac duplex (EVAR protocol) for the next 3 years. In 3 years, I would repeat one last CTA to look for possible Type II endoleak as suggested by the OVER trial, given the bimodal distribution of type II endoleaks and subsequent aneurysm progression. Thank you for allowing [place] to participate in this patient's care.  Leonides Sake, MD [facility] [place] Office: [phone] Pager: [phone]  10/14/2012, 9:30 AM  Addendum  Official read of CTA Abd/pelvis (10/14/2012)  IMPRESSION: 1. Bifurcated infrarenal stent graft without endoleak. 2. Interval decrease in size of 18 mm left and 36 mm right common iliac artery aneurysms.  Leonides Sake, MD [facility] [place] Office: [phone] Pager: [phone]  10/14/2012, 10:38 AM   VASCULAR QUALITY INITIATIVE FOLLOW UP DATA:  Current smoker: [  ] yes  [ x ] no  Living status: [x  ]  Home  [  ] Nursing home  [  ] Homeless    MEDS:  ASA [  ] yes  [  x] no- [  ] medical reason  [  ] non compliant  STATIN  [  x] yes  [  ] no- [  ] medical reason  [  ] non compliant  Beta blocker [x  ] yes  [  ] no- [  ] medical reason  [  ] non compliant  ACE inhibitor [  ] yes  [  x ] no- [  ] medical reason  [  ] non compliant  P2Y12 Antagonist [x  ] none  [  ] clopidogrel-Plavix  [  ] ticlopidine-Ticlid   [  ] prasugrel-Effient  [  ] ticagrelor- Brilinta    Anticoagulant [  ] None  [ x ] warfarin  [  ] rivaroxaban-Xarelto [  ] dabigatran- Pradaxa  Current Max AAA = 23 mm  Endoleak:  [ x ] no  [  ] yes- [  ] type I  [  ] type II  [  ] type III  [  ] indeterminate  Number of new interventions: none -   Date:      Why:  [  ] endoleak  [  ] limb occlusion   [  ] growth  [  ]  Migration   [  ] symtomatic/ rupture    Conversion to open repair: [  ] yes   [ x ] no   Date:   Other operation related to EVAR:  [  ] yes   [x ] no

## 2012-10-14 NOTE — Addendum Note (Signed)
Addended by: Adria Dill L on: 10/14/2012 11:35 AM   Modules accepted: Orders

## 2012-10-18 ENCOUNTER — Ambulatory Visit (INDEPENDENT_AMBULATORY_CARE_PROVIDER_SITE_OTHER): Payer: Medicare Other | Admitting: General Practice

## 2012-10-18 DIAGNOSIS — Z23 Encounter for immunization: Secondary | ICD-10-CM

## 2012-10-18 DIAGNOSIS — I2699 Other pulmonary embolism without acute cor pulmonale: Secondary | ICD-10-CM

## 2012-10-18 DIAGNOSIS — Z7901 Long term (current) use of anticoagulants: Secondary | ICD-10-CM

## 2012-10-18 DIAGNOSIS — Z86718 Personal history of other venous thrombosis and embolism: Secondary | ICD-10-CM

## 2012-10-18 LAB — POCT INR: INR: 3.1

## 2012-11-07 ENCOUNTER — Encounter: Payer: Self-pay | Admitting: Internal Medicine

## 2012-11-15 ENCOUNTER — Ambulatory Visit (INDEPENDENT_AMBULATORY_CARE_PROVIDER_SITE_OTHER): Payer: Medicare Other | Admitting: General Practice

## 2012-11-15 DIAGNOSIS — I2699 Other pulmonary embolism without acute cor pulmonale: Secondary | ICD-10-CM

## 2012-11-15 DIAGNOSIS — Z86718 Personal history of other venous thrombosis and embolism: Secondary | ICD-10-CM

## 2012-11-15 DIAGNOSIS — Z7901 Long term (current) use of anticoagulants: Secondary | ICD-10-CM

## 2012-11-15 LAB — POCT INR: INR: 2.1

## 2012-11-15 NOTE — Progress Notes (Signed)
Pre-visit discussion using our clinic review tool. No additional management support is needed unless otherwise documented below in the visit note.  

## 2012-12-13 ENCOUNTER — Ambulatory Visit (INDEPENDENT_AMBULATORY_CARE_PROVIDER_SITE_OTHER): Payer: Medicare Other | Admitting: General Practice

## 2012-12-13 DIAGNOSIS — Z86718 Personal history of other venous thrombosis and embolism: Secondary | ICD-10-CM

## 2012-12-13 DIAGNOSIS — Z7901 Long term (current) use of anticoagulants: Secondary | ICD-10-CM

## 2012-12-13 DIAGNOSIS — I2699 Other pulmonary embolism without acute cor pulmonale: Secondary | ICD-10-CM

## 2012-12-13 LAB — POCT INR: INR: 2.6

## 2012-12-13 NOTE — Progress Notes (Signed)
Pre-visit discussion using our clinic review tool. No additional management support is needed unless otherwise documented below in the visit note.  

## 2012-12-20 ENCOUNTER — Telehealth: Payer: Self-pay

## 2012-12-20 MED ORDER — PRAVASTATIN SODIUM 40 MG PO TABS: 40.0000 mg | ORAL_TABLET | Freq: Every day | ORAL | Status: DC

## 2012-12-20 MED ORDER — ALLOPURINOL 100 MG PO TABS: 100.0000 mg | ORAL_TABLET | Freq: Every day | ORAL | Status: DC

## 2012-12-20 NOTE — Telephone Encounter (Signed)
refill 

## 2013-01-17 ENCOUNTER — Other Ambulatory Visit: Payer: Self-pay | Admitting: General Practice

## 2013-01-17 ENCOUNTER — Telehealth: Payer: Self-pay | Admitting: *Deleted

## 2013-01-17 ENCOUNTER — Ambulatory Visit (INDEPENDENT_AMBULATORY_CARE_PROVIDER_SITE_OTHER): Payer: Medicare HMO | Admitting: General Practice

## 2013-01-17 DIAGNOSIS — Z86718 Personal history of other venous thrombosis and embolism: Secondary | ICD-10-CM

## 2013-01-17 DIAGNOSIS — Z7901 Long term (current) use of anticoagulants: Secondary | ICD-10-CM

## 2013-01-17 DIAGNOSIS — I2699 Other pulmonary embolism without acute cor pulmonale: Secondary | ICD-10-CM

## 2013-01-17 LAB — POCT INR: INR: 2.5

## 2013-01-17 MED ORDER — WARFARIN SODIUM 5 MG PO TABS: ORAL_TABLET | ORAL | Status: DC

## 2013-01-17 NOTE — Telephone Encounter (Signed)
Patient had just left coumadin clinic, prior to leaving coumadin refill request on voicemail.  Per Meriam Sprague, RN (coumadin clinic), she is contacting pharmacy for his coumadin refill, as requested.  Notified patient, understanding was verbalized.

## 2013-01-17 NOTE — Progress Notes (Signed)
Pre-visit discussion using our clinic review tool. No additional management support is needed unless otherwise documented below in the visit note.  

## 2013-01-30 IMAGING — CR DG ABDOMEN 1V
2 series · 2 of 2 positions shown · non-contrast
Comparison: 05/11/2011

CLINICAL DATA: IVC filter placement documentation

ABDOMEN - 1 VIEW

[t abdomen supine (1 of 2)]
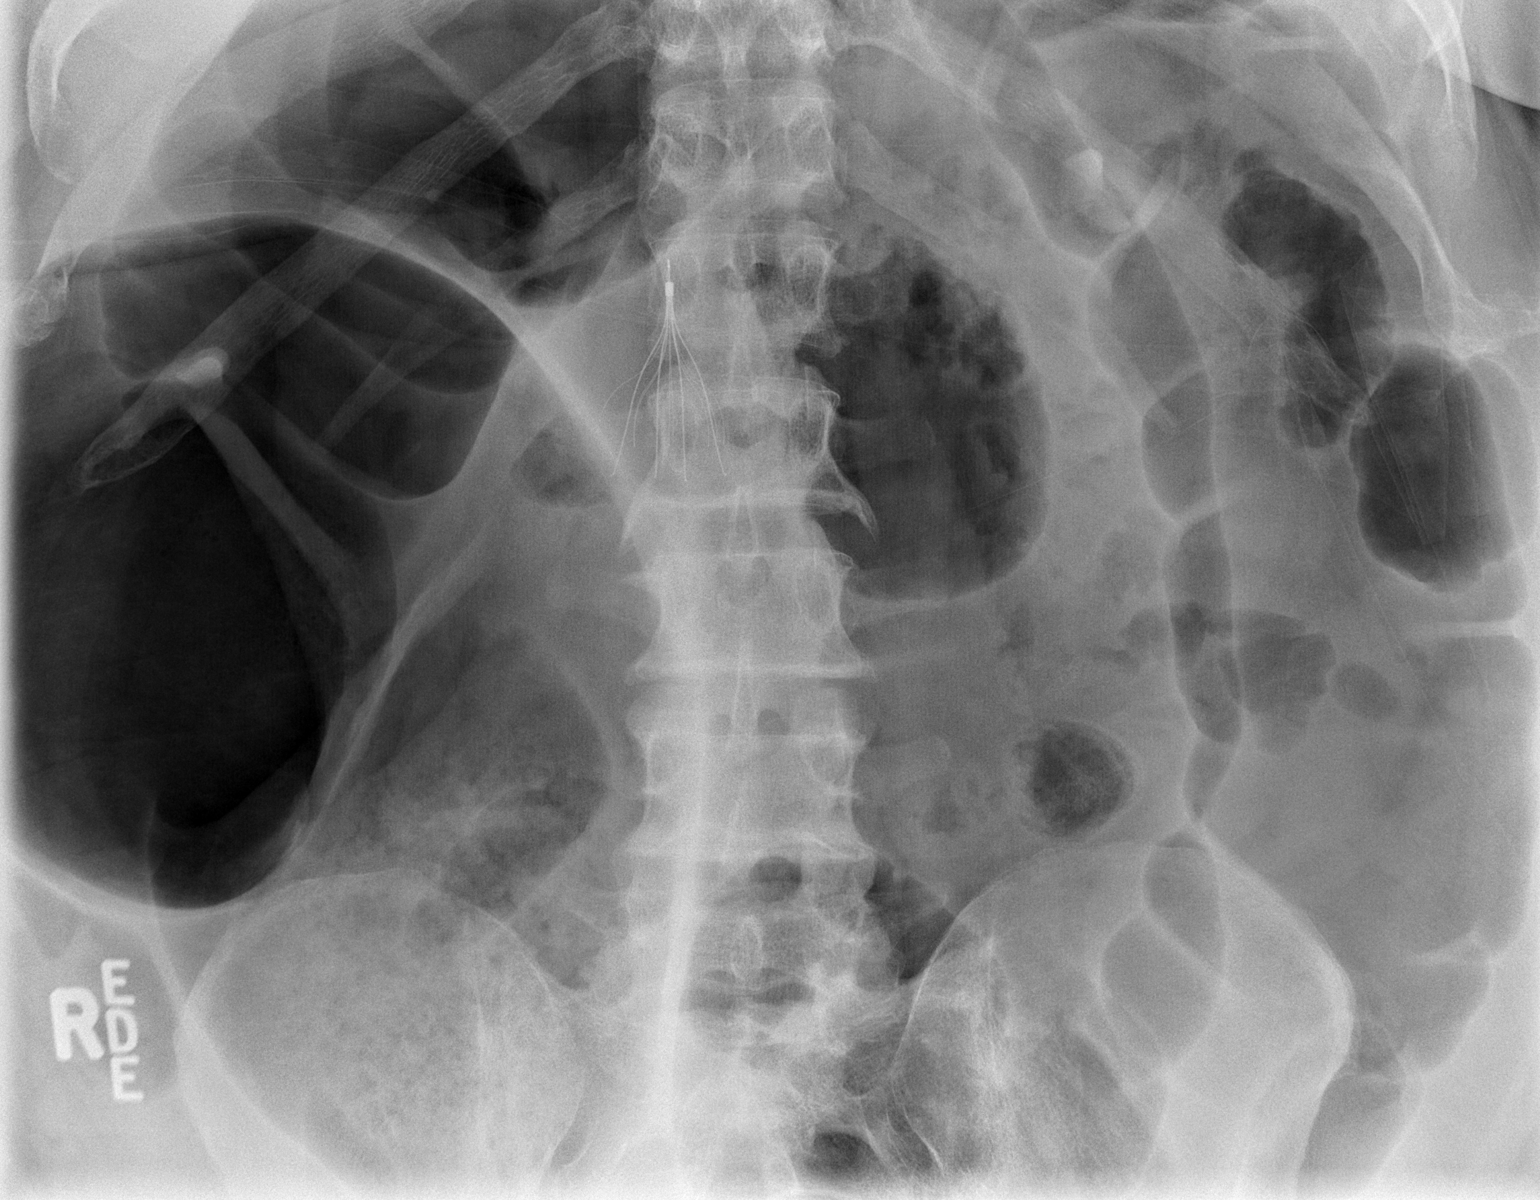

[t abdomen supine (2 of 2)]
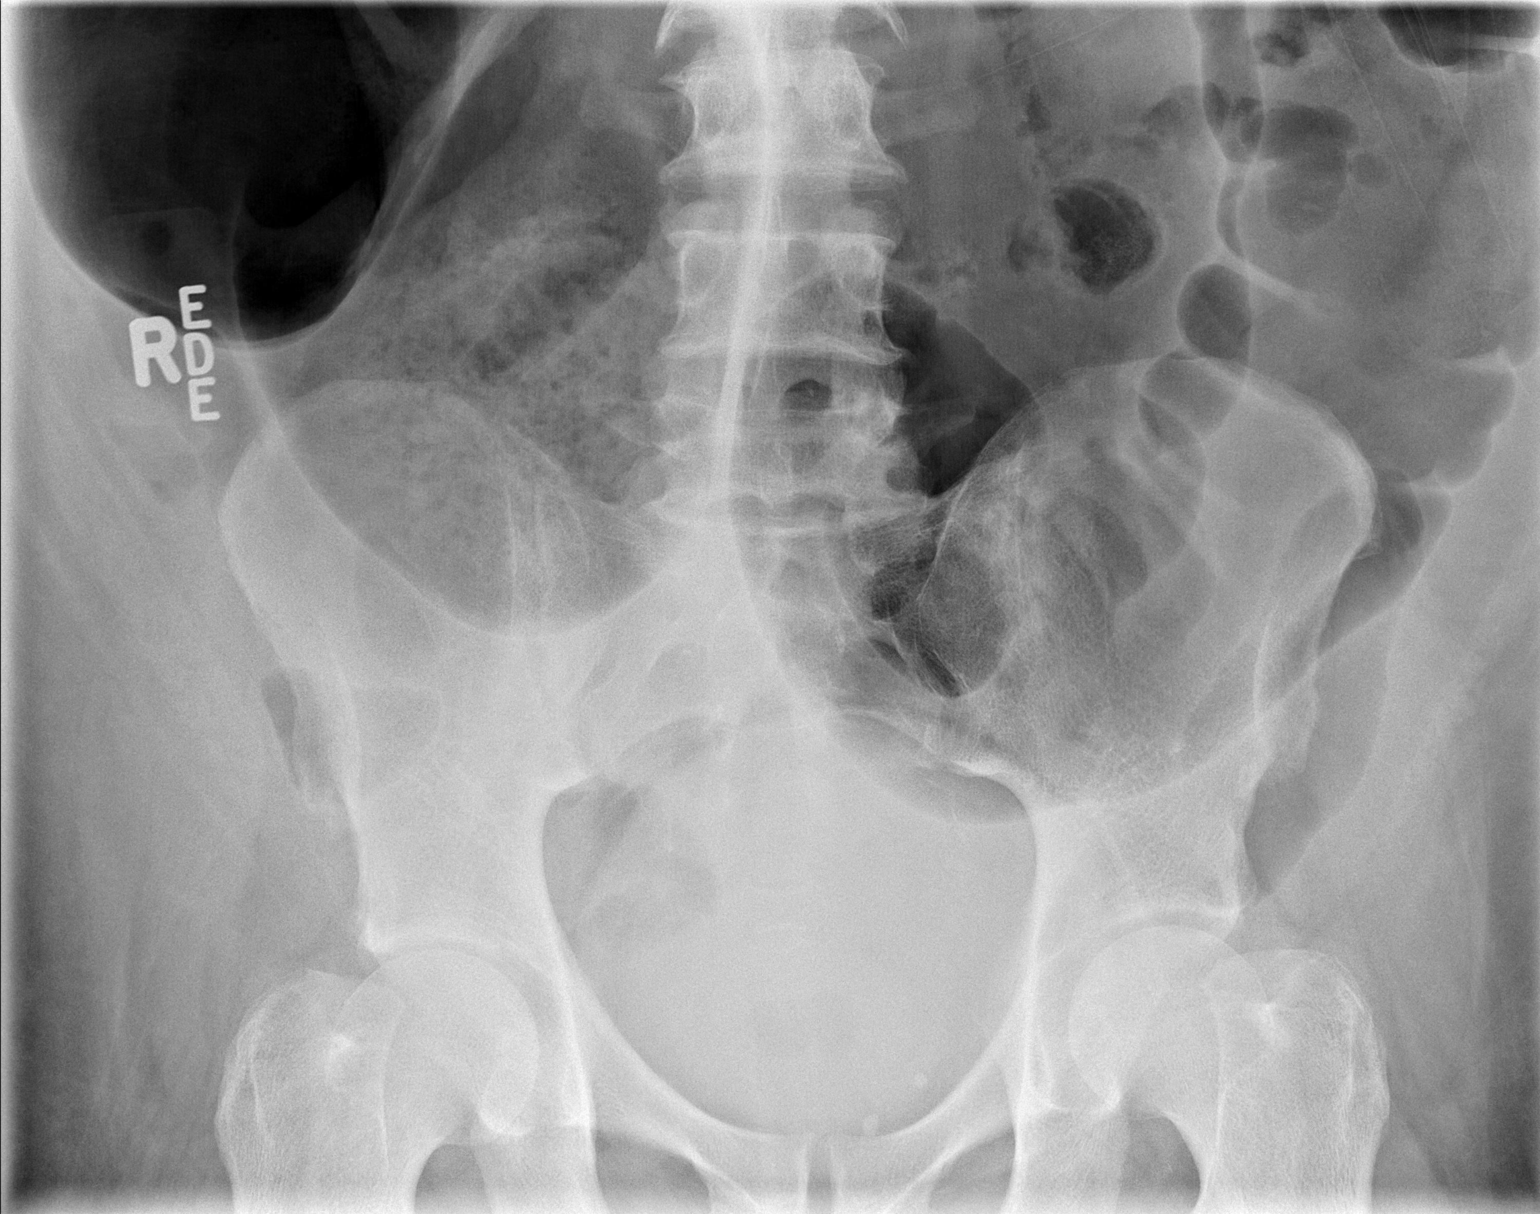

[2 of 2 positions shown; findings below may reference images not displayed]

FINDINGS: The inferior vena cava filter is positioned adjacent to
the right L1/L2 vertebral level.

Diffuse colonic distention is again noted to the level of the
distal descending colon and the degree of distention is unchanged
in comparison with prior exam.  No significant small bowel
distention is seen suggesting diffuse colonic ileus.

Degenerative change of the mid lumbar spine is seen.
IMPRESSION: Inferior vena cava filter positioning as discussed above.
Unchanged colonic distention without associated small bowel
distention suspicious for colonic ileus

## 2013-02-10 ENCOUNTER — Other Ambulatory Visit: Payer: Self-pay | Admitting: Internal Medicine

## 2013-03-14 ENCOUNTER — Other Ambulatory Visit: Payer: Self-pay | Admitting: Internal Medicine

## 2013-03-16 ENCOUNTER — Other Ambulatory Visit: Payer: Self-pay | Admitting: Internal Medicine

## 2013-03-18 ENCOUNTER — Other Ambulatory Visit: Payer: Self-pay | Admitting: Internal Medicine

## 2013-03-21 ENCOUNTER — Other Ambulatory Visit: Payer: Self-pay | Admitting: Internal Medicine

## 2013-03-21 ENCOUNTER — Telehealth: Payer: Self-pay | Admitting: Internal Medicine

## 2013-03-21 ENCOUNTER — Ambulatory Visit (INDEPENDENT_AMBULATORY_CARE_PROVIDER_SITE_OTHER): Payer: Medicare HMO | Admitting: General Practice

## 2013-03-21 DIAGNOSIS — Z86718 Personal history of other venous thrombosis and embolism: Secondary | ICD-10-CM

## 2013-03-21 DIAGNOSIS — Z7901 Long term (current) use of anticoagulants: Secondary | ICD-10-CM

## 2013-03-21 DIAGNOSIS — I2699 Other pulmonary embolism without acute cor pulmonale: Secondary | ICD-10-CM

## 2013-03-21 LAB — POCT INR: INR: 3.3

## 2013-03-21 MED ORDER — TRIAMCINOLONE ACETONIDE 0.1 % EX OINT: 1.0000 "application " | TOPICAL_OINTMENT | Freq: Every day | CUTANEOUS | Status: DC

## 2013-03-21 NOTE — Telephone Encounter (Signed)
Patient is requesting rx for triamcinolon 1lb jar to be called in to his pharmacy [facility] on [address]

## 2013-03-21 NOTE — Progress Notes (Signed)
Pre visit review using our clinic review tool, if applicable. No additional management support is needed unless otherwise documented below in the visit note. 

## 2013-03-21 NOTE — Telephone Encounter (Signed)
Refill done as requested and reminded to schedule appointment for further refills.

## 2013-03-23 ENCOUNTER — Other Ambulatory Visit: Payer: Self-pay

## 2013-03-23 ENCOUNTER — Telehealth: Payer: Self-pay | Admitting: *Deleted

## 2013-03-23 MED ORDER — DILTIAZEM HCL ER BEADS 360 MG PO CP24: 360.0000 mg | ORAL_CAPSULE | Freq: Every day | ORAL | Status: DC

## 2013-03-23 MED ORDER — DIGOXIN 125 MCG PO TABS: 0.1250 mg | ORAL_TABLET | Freq: Every day | ORAL | Status: DC

## 2013-03-23 MED ORDER — ALLOPURINOL 100 MG PO TABS: 100.0000 mg | ORAL_TABLET | Freq: Every day | ORAL | Status: DC

## 2013-03-23 MED ORDER — PRAVASTATIN SODIUM 40 MG PO TABS: 40.0000 mg | ORAL_TABLET | Freq: Every day | ORAL | Status: DC

## 2013-03-23 MED ORDER — TRIAMCINOLONE ACETONIDE 0.1 % EX OINT: 1.0000 "application " | TOPICAL_OINTMENT | Freq: Every day | CUTANEOUS | Status: DC

## 2013-03-23 MED ORDER — METOPROLOL TARTRATE 50 MG PO TABS: 50.0000 mg | ORAL_TABLET | Freq: Two times a day (BID) | ORAL | Status: DC

## 2013-03-23 NOTE — Telephone Encounter (Signed)
Patient phoned just to give a FYI that [facility] was going to be sending documentation regarding medication.  CB# [phone]

## 2013-03-23 NOTE — Telephone Encounter (Signed)
[facility] prescriptions have been received and filled as requested.

## 2013-03-23 NOTE — Telephone Encounter (Signed)
Called the patient to inform received request, filled and to call back to schedule appointment.

## 2013-03-23 NOTE — Addendum Note (Signed)
Addended by: Sharon Seller B on: 03/23/2013 01:28 PM   Modules accepted: Orders

## 2013-04-18 ENCOUNTER — Encounter: Payer: Self-pay | Admitting: Internal Medicine

## 2013-04-18 ENCOUNTER — Ambulatory Visit (INDEPENDENT_AMBULATORY_CARE_PROVIDER_SITE_OTHER): Payer: Commercial Managed Care - HMO | Admitting: General Practice

## 2013-04-18 ENCOUNTER — Ambulatory Visit (INDEPENDENT_AMBULATORY_CARE_PROVIDER_SITE_OTHER): Payer: Medicare HMO | Admitting: Internal Medicine

## 2013-04-18 ENCOUNTER — Other Ambulatory Visit (INDEPENDENT_AMBULATORY_CARE_PROVIDER_SITE_OTHER): Payer: Commercial Managed Care - HMO

## 2013-04-18 VITALS — BP 140/88 | HR 74 | Temp 98.2°F | Ht 74.0 in | Wt 327.0 lb

## 2013-04-18 DIAGNOSIS — Z0000 Encounter for general adult medical examination without abnormal findings: Secondary | ICD-10-CM

## 2013-04-18 DIAGNOSIS — Z7901 Long term (current) use of anticoagulants: Secondary | ICD-10-CM

## 2013-04-18 DIAGNOSIS — I2699 Other pulmonary embolism without acute cor pulmonale: Secondary | ICD-10-CM

## 2013-04-18 DIAGNOSIS — Z86718 Personal history of other venous thrombosis and embolism: Secondary | ICD-10-CM

## 2013-04-18 LAB — CBC WITH DIFFERENTIAL/PLATELET
BASOS ABS: 0 10*3/uL (ref 0.0–0.1)
Basophils Relative: 0.4 % (ref 0.0–3.0)
EOS ABS: 0.1 10*3/uL (ref 0.0–0.7)
Eosinophils Relative: 2.6 % (ref 0.0–5.0)
HCT: 46.1 % (ref 39.0–52.0)
Hemoglobin: 15.2 g/dL (ref 13.0–17.0)
LYMPHS ABS: 0.9 10*3/uL (ref 0.7–4.0)
LYMPHS PCT: 17.6 % (ref 12.0–46.0)
MCHC: 33 g/dL (ref 30.0–36.0)
MCV: 96.1 fl (ref 78.0–100.0)
MONOS PCT: 12 % (ref 3.0–12.0)
Monocytes Absolute: 0.6 10*3/uL (ref 0.1–1.0)
Neutro Abs: 3.6 10*3/uL (ref 1.4–7.7)
Neutrophils Relative %: 67.4 % (ref 43.0–77.0)
PLATELETS: 190 10*3/uL (ref 150.0–400.0)
RBC: 4.8 Mil/uL (ref 4.22–5.81)
RDW: 15 % — ABNORMAL HIGH (ref 11.5–14.6)
WBC: 5.4 10*3/uL (ref 4.5–10.5)

## 2013-04-18 LAB — BASIC METABOLIC PANEL
BUN: 17 mg/dL (ref 6–23)
CO2: 27 meq/L (ref 19–32)
Calcium: 9.2 mg/dL (ref 8.4–10.5)
Chloride: 105 mEq/L (ref 96–112)
Creatinine, Ser: 1.1 mg/dL (ref 0.4–1.5)
GFR: 90.7 mL/min (ref >60.00)
GLUCOSE: 94 mg/dL (ref 70–99)
POTASSIUM: 4.5 meq/L (ref 3.5–5.1)
SODIUM: 139 meq/L (ref 135–145)

## 2013-04-18 LAB — POCT INR: INR: 3.5

## 2013-04-18 LAB — LIPID PANEL
Cholesterol: 143 mg/dL (ref 0–200)
HDL: 38.2 mg/dL — ABNORMAL LOW (ref >39.00)
LDL Cholesterol: 86 mg/dL (ref 0–99)
Total CHOL/HDL Ratio: 4
Triglycerides: 94 mg/dL (ref 0.0–149.0)
VLDL: 18.8 mg/dL (ref 0.0–40.0)

## 2013-04-18 LAB — URINALYSIS, ROUTINE W REFLEX MICROSCOPIC
Bilirubin Urine: NEGATIVE
HGB URINE DIPSTICK: NEGATIVE
KETONES UR: NEGATIVE
LEUKOCYTES UA: NEGATIVE
Nitrite: NEGATIVE
Specific Gravity, Urine: >=1.03 — AB (ref 1.000–1.030)
Total Protein, Urine: NEGATIVE
URINE GLUCOSE: NEGATIVE
UROBILINOGEN UA: 0.2 (ref 0.0–1.0)
pH: 5.5 (ref 5.0–8.0)

## 2013-04-18 LAB — HEPATIC FUNCTION PANEL
ALBUMIN: 3.8 g/dL (ref 3.5–5.2)
ALK PHOS: 87 U/L (ref 39–117)
ALT: 21 U/L (ref 0–53)
AST: 22 U/L (ref 0–37)
BILIRUBIN DIRECT: 0.1 mg/dL (ref 0.0–0.3)
TOTAL PROTEIN: 7.9 g/dL (ref 6.0–8.3)
Total Bilirubin: 0.7 mg/dL (ref 0.3–1.2)

## 2013-04-18 LAB — TSH: TSH: 1.61 u[IU]/mL (ref 0.35–5.50)

## 2013-04-18 LAB — PSA: PSA: 0.66 ng/mL (ref 0.10–4.00)

## 2013-04-18 NOTE — Patient Instructions (Signed)

## 2013-04-18 NOTE — Progress Notes (Signed)
Pre visit review using our clinic review tool, if applicable. No additional management support is needed unless otherwise documented below in the visit note. 

## 2013-04-18 NOTE — Progress Notes (Signed)
Subjective:    Patient ID: [name], male    DOB: [birth year], 63 y.o.   MRN: [id number]  HPI  Here for wellness and f/u;  Overall doing ok;  Pt denies CP, worsening SOB, DOE, wheezing, orthopnea, PND, worsening LE edema, palpitations, dizziness or syncope.  Pt denies neurological change such as new headache, facial or extremity weakness.  Pt denies polydipsia, polyuria, or low sugar symptoms. Pt states overall good compliance with treatment and medications, good tolerability, and has been trying to follow lower cholesterol diet.  Pt denies worsening depressive symptoms, suicidal ideation or panic. No fever, night sweats, wt loss, loss of appetite, or other constitutional symptoms.  Pt states good ability with ADL's, has low fall risk, home safety reviewed and adequate, no other significant changes in hearing or vision, and only occasionally active with exercise.  No current complaints Past Medical History  Diagnosis Date  . Hypertension   . Hyperlipemia   . Obstructive sleep apnea July 2013    Not wearing CPAP. Split-Night sleep study 06/03/11 AHI = 27.43/hr (TOTAL SLEEP TIME 105 minutes) and REM AHI = 120.00/hr  . Morbid obesity with BMI of 40.0-44.9, adult   . PAH (pulmonary artery hypertension)   . Pulmonary embolism 2008    DVT --> Massive PE with right-sided strain. On coumadin. ECHO 02/28/10 showed normal LV size, moderate concentricLVH, normal EF, normal atrial sizes, RV was normal but previously dilated.  . Chronic anticoagulation, PE DVT PAF   . Morbid obesity   . HTN (hypertension)   . Dyslipidemia   . PAF (paroxysmal atrial fibrillation)/atrial flutter 02/28/2010    ECHO : normal LV size, moderate concentricLVH, normal EF, normal atrial sizes, RV was normal but previously dilated;;  Myoview : Normal perfusion study. Low risk scan.  . Prolonged QT interval   . Edema of left lower extremity     S/P I&D 08/30/06  . GI bleed 04/2011    With anemia. EGD & colonoscopy 05/11/2011  .  Anemia     From GI bleed in 05/11/11.   . Iliopsoas muscle hematoma 04/2011  . AAA (abdominal aortic aneurysm) July 2013    Abdominal Aortic Endovascular Stent Graft 08/11/11. Bilateral. Right is 3.2 & the left is 3.6. "Preclose" repair bilateral common femoral artery. Placement of catheter in aorta x 2. Repair of aorta w/modular bifurcated prosthesis w/bilateral limbs.  Placement of right limb extension: 23 mm x 12 cm. Placement of left limb extension: 14 mm x 7 cm.  . Iliac artery aneurysm, bilateral     PVD, bilat common iliac aneurysms [443.9]  . Lung nodule 2008  . Depression   . Chronic pain   . Osteoarthritis of both knees     Bilateral Arthroscopy in 2000 & 2008. Both were complicated w/venous thromboembolism S/P IVC filter placement due to one being massive PE in 2008.  [name] Psoriasis   . History of alcohol dependence    Past Surgical History  Procedure Laterality Date  . Knee surgery  1999  . Esophagogastroduodenoscopy  05/08/2011    For GI bleed/Anemia. Also had a colonoscopy at this time.  . Colonoscopy  05/11/2011    For GI bleed. Performed by Dr. Juanita Craver, MD. Also had a EGD performed at this time.  . Colonoscopy  05/12/2011  . Achilles tendon surgery    . Abdominal aortic evar  08/11/11    Bilateral. Right is 3.2 & the left is 3.6. "Preclose" repair bilateral common femoral artery. Placement of  catheter in aorta x 2. Repair of aorta w/modular bifurcated prosthesis w/bilateral limbs.  Placement of right limb extension: 23 mm x 12 cm. Placement of left limb extension: 14 mm x 7 cm.  . Knee arthroscopy Bilateral 2000 & 2008    DJD knees. Both were complicated w/venous thromboembolism S/P IVC filter placement due to one being massive PE in 2008.    reports that he quit smoking about 6 years ago. He has never used smokeless tobacco. He reports that he drinks about .6 - 1.2 ounces of alcohol per week. He reports that he uses illicit drugs. family history includes Arthritis in his  father and mother; CAD (age of onset: 35) in his father; Cancer in his brother and mother. There is no history of Fainting. Allergies  Allergen Reactions  . Crestor [Rosuvastatin] Swelling   Current Outpatient Prescriptions on File Prior to Visit  Medication Sig Dispense Refill  . allopurinol (ZYLOPRIM) 100 MG tablet Take 1 tablet (100 mg total) by mouth daily.  90 tablet  0  . clotrimazole (LOTRIMIN) 1 % cream Apply topically 2 (two) times daily.  30 g  1  . desonide (DESOWEN) 0.05 % cream Apply 1 application topically daily. Apply to rash on face      . desonide (DESOWEN) 0.05 % lotion       . digoxin (LANOXIN) 0.125 MG tablet TAKE ONE TABLET BY MOUTH ONCE DAILY (HOLD  IF  PULSE  LESS  THAN  60)  30 tablet  0  . digoxin (LANOXIN) 0.125 MG tablet Take 1 tablet (0.125 mg total) by mouth daily.  90 tablet  0  . diltiazem (TAZTIA XT) 360 MG 24 hr capsule Take 1 capsule (360 mg total) by mouth daily.  90 capsule  0  . fluocinonide ointment (LIDEX) 8.65 % Apply 1 application topically 2 (two) times daily. Apply to dry skin areas on body      . metoprolol (LOPRESSOR) 50 MG tablet Take 1 tablet (50 mg total) by mouth 2 (two) times daily.  60 tablet  0  . ondansetron (ZOFRAN-ODT) 8 MG disintegrating tablet Take 8 mg by mouth every 8 (eight) hours as needed.       [name] oxyCODONE-acetaminophen (PERCOCET) 10-325 MG per tablet Take 1 tablet by mouth every 6 (six) hours as needed. Pain.  30 tablet  0  . pravastatin (PRAVACHOL) 40 MG tablet Take 1 tablet (40 mg total) by mouth daily.  90 tablet  0  . triamcinolone ointment (KENALOG) 0.1 % Apply 1 application topically daily. Apply to skin rash areas on body  454 g  0  . warfarin (COUMADIN) 5 MG tablet Take as directed by coumadin clinic.  70 tablet  3  . zolpidem (AMBIEN) 5 MG tablet Take 5 mg by mouth at bedtime as needed.        No current facility-administered medications on file prior to visit.   Review of Systems Constitutional: Negative for  diaphoresis, activity change, appetite change or unexpected weight change.  HENT: Negative for hearing loss, ear pain, facial swelling, mouth sores and neck stiffness.   Eyes: Negative for pain, redness and visual disturbance.  Respiratory: Negative for shortness of breath and wheezing.   Cardiovascular: Negative for chest pain and palpitations.  Gastrointestinal: Negative for diarrhea, blood in stool, abdominal distention or other pain Genitourinary: Negative for hematuria, flank pain or change in urine volume.  Musculoskeletal: Negative for myalgias and joint swelling.  Skin: Negative for color change and wound.  Neurological: Negative for syncope and numbness. other than noted Hematological: Negative for adenopathy.  Psychiatric/Behavioral: Negative for hallucinations, self-injury, decreased concentration and agitation.      Objective:   Physical Exam BP 140/88  Pulse 74  Temp(Src) 98.2 F (36.8 C) (Oral)  Ht 6\' 2"  (1.88 m)  Wt 327 lb (148.326 kg)  BMI 41.97 kg/m2  SpO2 98% VS noted,  Constitutional: Pt is oriented to person, place, and time. Appears well-developed and well-nourished.  Head: Normocephalic and atraumatic.  Right Ear: External ear normal.  Left Ear: External ear normal.  Nose: Nose normal.  Mouth/Throat: Oropharynx is clear and moist.  Eyes: Conjunctivae and EOM are normal. Pupils are equal, round, and reactive to light.  Neck: Normal range of motion. Neck supple. No JVD present. No tracheal deviation present.  Cardiovascular: Normal rate, regular rhythm, normal heart sounds and intact distal pulses.   Pulmonary/Chest: Effort normal and breath sounds normal.  Abdominal: Soft. Bowel sounds are normal. There is no tenderness. No HSM  Musculoskeletal: Normal range of motion. Exhibits no edema.  Lymphadenopathy:  Has no cervical adenopathy.  Neurological: Pt is alert and oriented to person, place, and time. Pt has normal reflexes. No cranial nerve deficit.  Skin:  Skin is warm and dry. No rash noted.  Psychiatric:  Has  normal mood and affect. Behavior is normal.     Assessment & Plan:

## 2013-04-19 NOTE — Assessment & Plan Note (Signed)

## 2013-04-29 IMAGING — CR DG ABD PORTABLE 1V
1 series · 1 of 1 positions shown · non-contrast
Comparison: 06/03/2011

CLINICAL DATA: Stent graft placement.

PORTABLE ABDOMEN - 1 VIEW

[view not recorded]
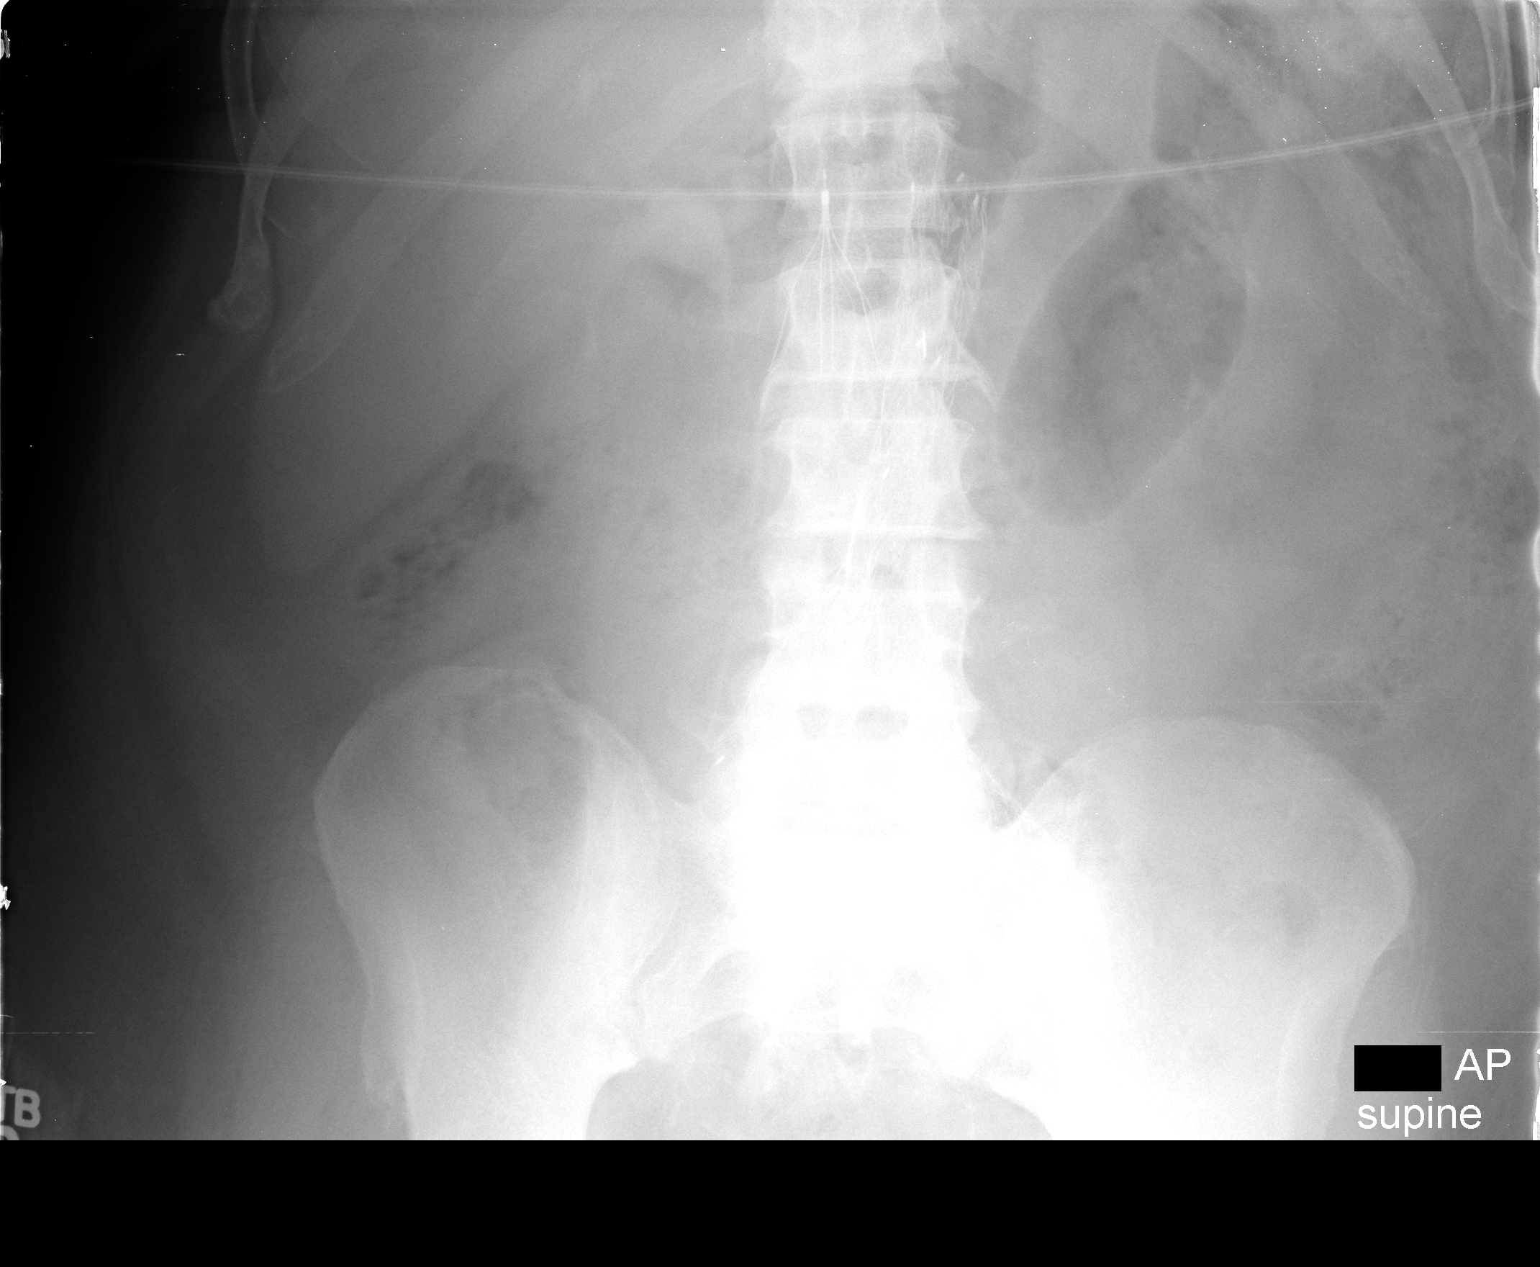

[1 of 1 positions shown; findings below may reference images not displayed]

FINDINGS: 2 supine views.  There is an IVC filter which projects
over the A L1-2 level and is unchanged.  An aortic stent graft is
also identified and extends from the L1 level into the upper
pelvis.

Mild degradation secondary patient body habitus.  Given this
factor, no evidence of bowel obstruction or free intraperitoneal
air.  There are vascular coils within the left hemi pelvis.
Phleboliths in the left hemi pelvis as well.  Distal gas and stool.
IMPRESSION: Postsurgical changes of the aortic stent graft repair.  No acute
findings.

## 2013-05-16 ENCOUNTER — Ambulatory Visit (INDEPENDENT_AMBULATORY_CARE_PROVIDER_SITE_OTHER): Payer: Medicare HMO | Admitting: General Practice

## 2013-05-16 DIAGNOSIS — I2699 Other pulmonary embolism without acute cor pulmonale: Secondary | ICD-10-CM

## 2013-05-16 DIAGNOSIS — Z452 Encounter for adjustment and management of vascular access device: Secondary | ICD-10-CM | POA: Insufficient documentation

## 2013-05-16 DIAGNOSIS — Z7901 Long term (current) use of anticoagulants: Secondary | ICD-10-CM

## 2013-05-16 DIAGNOSIS — Z86718 Personal history of other venous thrombosis and embolism: Secondary | ICD-10-CM

## 2013-05-16 LAB — POCT INR: INR: 2.6

## 2013-05-16 NOTE — Progress Notes (Signed)
Pre visit review using our clinic review tool, if applicable. No additional management support is needed unless otherwise documented below in the visit note. 

## 2013-06-13 ENCOUNTER — Ambulatory Visit (INDEPENDENT_AMBULATORY_CARE_PROVIDER_SITE_OTHER): Payer: Medicare HMO | Admitting: General Practice

## 2013-06-13 ENCOUNTER — Other Ambulatory Visit: Payer: Self-pay | Admitting: General Practice

## 2013-06-13 DIAGNOSIS — Z452 Encounter for adjustment and management of vascular access device: Secondary | ICD-10-CM

## 2013-06-13 DIAGNOSIS — Z7901 Long term (current) use of anticoagulants: Secondary | ICD-10-CM

## 2013-06-13 DIAGNOSIS — Z5181 Encounter for therapeutic drug level monitoring: Secondary | ICD-10-CM

## 2013-06-13 DIAGNOSIS — I2699 Other pulmonary embolism without acute cor pulmonale: Secondary | ICD-10-CM

## 2013-06-13 DIAGNOSIS — Z86718 Personal history of other venous thrombosis and embolism: Secondary | ICD-10-CM

## 2013-06-13 LAB — POCT INR: INR: 2.5

## 2013-06-13 MED ORDER — WARFARIN SODIUM 5 MG PO TABS: ORAL_TABLET | ORAL | Status: DC

## 2013-06-13 NOTE — Progress Notes (Signed)
Pre visit review using our clinic review tool, if applicable. No additional management support is needed unless otherwise documented below in the visit note. 

## 2013-07-04 ENCOUNTER — Encounter: Payer: Self-pay | Admitting: Internal Medicine

## 2013-07-04 ENCOUNTER — Ambulatory Visit (INDEPENDENT_AMBULATORY_CARE_PROVIDER_SITE_OTHER): Payer: Medicare HMO | Admitting: Internal Medicine

## 2013-07-04 VITALS — BP 120/78 | HR 79 | Temp 98.3°F | Ht 74.0 in | Wt 325.0 lb

## 2013-07-04 DIAGNOSIS — I10 Essential (primary) hypertension: Secondary | ICD-10-CM

## 2013-07-04 MED ORDER — DILTIAZEM HCL ER BEADS 360 MG PO CP24: 360.0000 mg | ORAL_CAPSULE | Freq: Every day | ORAL | Status: DC

## 2013-07-04 NOTE — Progress Notes (Signed)
Pre visit review using our clinic review tool, if applicable. No additional management support is needed unless otherwise documented below in the visit note. 

## 2013-07-04 NOTE — Assessment & Plan Note (Signed)
Stable overall  Ok for no charge today

## 2013-07-04 NOTE — Patient Instructions (Signed)
No charge for visit today, as this visit was directed by mistake  Please see the front desk for copay refund  OK to return in October 2015 as you had originally planned

## 2013-07-04 NOTE — Progress Notes (Signed)
   Subjective:    Patient ID: [name], male    DOB: 01-Apr-1950, 63 y.o.   MRN: [id number]  HPI  Pt here by "mistake" as he thought he needed an appt as this was written on his most recent diltaizem refill.  No complaints.  Scheduled for f/u in oct 2015 Pt denies chest pain, increased sob or doe, wheezing, orthopnea, PND, increased LE swelling, palpitations, dizziness or syncope.  No exam done    Review of Systems     Objective:   Physical Exam        Assessment & Plan:

## 2013-07-11 ENCOUNTER — Ambulatory Visit (INDEPENDENT_AMBULATORY_CARE_PROVIDER_SITE_OTHER): Payer: Medicare HMO | Admitting: General Practice

## 2013-07-11 ENCOUNTER — Other Ambulatory Visit: Payer: Self-pay

## 2013-07-11 DIAGNOSIS — Z7901 Long term (current) use of anticoagulants: Secondary | ICD-10-CM

## 2013-07-11 DIAGNOSIS — Z5181 Encounter for therapeutic drug level monitoring: Secondary | ICD-10-CM

## 2013-07-11 DIAGNOSIS — Z86718 Personal history of other venous thrombosis and embolism: Secondary | ICD-10-CM

## 2013-07-11 DIAGNOSIS — I2699 Other pulmonary embolism without acute cor pulmonale: Secondary | ICD-10-CM

## 2013-07-11 LAB — POCT INR: INR: 2

## 2013-07-11 MED ORDER — PRAVASTATIN SODIUM 40 MG PO TABS: 40.0000 mg | ORAL_TABLET | Freq: Every day | ORAL | Status: DC

## 2013-07-11 MED ORDER — DIGOXIN 125 MCG PO TABS: 0.1250 mg | ORAL_TABLET | Freq: Every day | ORAL | Status: DC

## 2013-07-11 MED ORDER — ALLOPURINOL 100 MG PO TABS: 100.0000 mg | ORAL_TABLET | Freq: Every day | ORAL | Status: DC

## 2013-07-11 NOTE — Progress Notes (Signed)
Pre visit review using our clinic review tool, if applicable. No additional management support is needed unless otherwise documented below in the visit note. 

## 2013-07-12 ENCOUNTER — Other Ambulatory Visit: Payer: Self-pay

## 2013-07-12 MED ORDER — DIGOXIN 125 MCG PO TABS: 0.1250 mg | ORAL_TABLET | Freq: Every day | ORAL | Status: DC

## 2013-07-12 MED ORDER — DILTIAZEM HCL ER BEADS 360 MG PO CP24: 360.0000 mg | ORAL_CAPSULE | Freq: Every day | ORAL | Status: DC

## 2013-07-12 MED ORDER — PRAVASTATIN SODIUM 40 MG PO TABS: 40.0000 mg | ORAL_TABLET | Freq: Every day | ORAL | Status: DC

## 2013-07-12 MED ORDER — METOPROLOL TARTRATE 50 MG PO TABS: 50.0000 mg | ORAL_TABLET | Freq: Two times a day (BID) | ORAL | Status: DC

## 2013-07-12 MED ORDER — ALLOPURINOL 100 MG PO TABS: 100.0000 mg | ORAL_TABLET | Freq: Every day | ORAL | Status: DC

## 2013-08-22 ENCOUNTER — Ambulatory Visit (INDEPENDENT_AMBULATORY_CARE_PROVIDER_SITE_OTHER): Payer: Medicare HMO | Admitting: Family Medicine

## 2013-08-22 DIAGNOSIS — I2699 Other pulmonary embolism without acute cor pulmonale: Secondary | ICD-10-CM

## 2013-08-22 DIAGNOSIS — Z7901 Long term (current) use of anticoagulants: Secondary | ICD-10-CM

## 2013-08-22 DIAGNOSIS — Z5181 Encounter for therapeutic drug level monitoring: Secondary | ICD-10-CM

## 2013-08-22 DIAGNOSIS — Z86718 Personal history of other venous thrombosis and embolism: Secondary | ICD-10-CM

## 2013-08-22 LAB — POCT INR: INR: 3.2

## 2013-09-19 ENCOUNTER — Ambulatory Visit: Payer: Medicare HMO

## 2013-09-26 ENCOUNTER — Ambulatory Visit (INDEPENDENT_AMBULATORY_CARE_PROVIDER_SITE_OTHER): Payer: Commercial Managed Care - HMO | Admitting: *Deleted

## 2013-09-26 DIAGNOSIS — Z5181 Encounter for therapeutic drug level monitoring: Secondary | ICD-10-CM

## 2013-09-26 DIAGNOSIS — I2699 Other pulmonary embolism without acute cor pulmonale: Secondary | ICD-10-CM

## 2013-09-26 DIAGNOSIS — Z23 Encounter for immunization: Secondary | ICD-10-CM

## 2013-09-26 DIAGNOSIS — Z86718 Personal history of other venous thrombosis and embolism: Secondary | ICD-10-CM

## 2013-09-26 LAB — POCT INR: INR: 3.1

## 2013-10-18 ENCOUNTER — Ambulatory Visit: Payer: Medicare HMO | Admitting: Internal Medicine

## 2013-10-19 ENCOUNTER — Encounter: Payer: Self-pay | Admitting: Family

## 2013-10-20 ENCOUNTER — Encounter: Payer: Self-pay | Admitting: Family

## 2013-10-20 ENCOUNTER — Ambulatory Visit (INDEPENDENT_AMBULATORY_CARE_PROVIDER_SITE_OTHER): Payer: Commercial Managed Care - HMO | Admitting: Family

## 2013-10-20 ENCOUNTER — Ambulatory Visit (HOSPITAL_COMMUNITY)
Admission: RE | Admit: 2013-10-20 | Discharge: 2013-10-20 | Disposition: A | Discharge status: Home / Self Care | Payer: Medicare HMO | Source: Ambulatory Visit | Attending: Family | Admitting: Family

## 2013-10-20 VITALS — BP 153/90 | HR 71 | Resp 16 | Ht 74.0 in | Wt 327.0 lb

## 2013-10-20 DIAGNOSIS — I723 Aneurysm of iliac artery: Secondary | ICD-10-CM | POA: Insufficient documentation

## 2013-10-20 DIAGNOSIS — Z48812 Encounter for surgical aftercare following surgery on the circulatory system: Secondary | ICD-10-CM | POA: Diagnosis not present

## 2013-10-20 NOTE — Addendum Note (Signed)
Addended by: Dorthula Rue L on: 10/20/2013 11:02 AM   Modules accepted: Orders

## 2013-10-20 NOTE — Progress Notes (Signed)
[facility] [place]  Established Abdominal Aortic Aneurysm-EVAR  History of Present Illness  [name] is a 63 y.o. (20-Jun-1950) male patient of Dr. Bridgett Larsson who presents for routine follow up s/p EVAR (Date: 08/11/11) and L IIA emb. (Date: 07/30/11) for B CIA aneurysms. Most recent EVAR duplex (Date: 04/08/12) demonstrates: no endoleak and shrinking iliac aneurysm sac sizes. Most recent CT (Date: 10/14/2012) demonstrates: no endoleak and decreasing iliac sac sizes to limb size. The patient has not had back or abdominal pain. The patient denies any buttock, thighs, or calves claudication, denies non healing wounds.  The patient denies back or abdominal pain.  The patient is a former smoker. The patient denies history of stroke or TIA symptoms. He is trying to lose weight.  Pt Diabetic: No Pt smoker: former smoker, quit in 2008  He takes coumadin for atrial fib, prescribed by his cardiologist.  Past Medical History  Diagnosis Date  . Hypertension   . Hyperlipemia   . Obstructive sleep apnea July 2013    Not wearing CPAP. Split-Night sleep study 06/03/11 AHI = 27.43/hr (TOTAL SLEEP TIME 105 minutes) and REM AHI = 120.00/hr  . Morbid obesity with BMI of 40.0-44.9, adult   . PAH (pulmonary artery hypertension)   . Pulmonary embolism 2008    DVT --> Massive PE with right-sided strain. On coumadin. ECHO 02/28/10 showed normal LV size, moderate concentricLVH, normal EF, normal atrial sizes, RV was normal but previously dilated.  . Chronic anticoagulation, PE DVT PAF   . Morbid obesity   . HTN (hypertension)   . Dyslipidemia   . PAF (paroxysmal atrial fibrillation)/atrial flutter 02/28/2010    ECHO : normal LV size, moderate concentricLVH, normal EF, normal atrial sizes, RV was normal but previously dilated;;  Myoview : Normal perfusion study. Low risk scan.  . Prolonged QT interval   . Edema of left lower extremity     S/P I&D 08/30/06  . GI bleed 04/2011    With anemia.  EGD & colonoscopy 05/11/2011  . Anemia     From GI bleed in 05/11/11.   . Iliopsoas muscle hematoma 04/2011  . AAA (abdominal aortic aneurysm) July 2013    Abdominal Aortic Endovascular Stent Graft 08/11/11. Bilateral. Right is 3.2 & the left is 3.6. "Preclose" repair bilateral common femoral artery. Placement of catheter in aorta x 2. Repair of aorta w/modular bifurcated prosthesis w/bilateral limbs.  Placement of right limb extension: 23 mm x 12 cm. Placement of left limb extension: 14 mm x 7 cm.  . Iliac artery aneurysm, bilateral     PVD, bilat common iliac aneurysms [443.9]  . Lung nodule 2008  . Depression   . Chronic pain   . Osteoarthritis of both knees     Bilateral Arthroscopy in 2000 & 2008. Both were complicated w/venous thromboembolism S/P IVC filter placement due to one being massive PE in 2008.  [name] Psoriasis   . History of alcohol dependence    Past Surgical History  Procedure Laterality Date  . Knee surgery  1999  . Esophagogastroduodenoscopy  05/08/2011    For GI bleed/Anemia. Also had a colonoscopy at this time.  . Colonoscopy  05/11/2011    For GI bleed. Performed by Dr. Juanita Craver, MD. Also had a EGD performed at this time.  . Colonoscopy  05/12/2011  . Achilles tendon surgery    . Abdominal aortic evar  08/11/11    Bilateral. Right is 3.2 & the left is 3.6. "Preclose" repair  bilateral common femoral artery. Placement of catheter in aorta x 2. Repair of aorta w/modular bifurcated prosthesis w/bilateral limbs.  Placement of right limb extension: 23 mm x 12 cm. Placement of left limb extension: 14 mm x 7 cm.  . Knee arthroscopy Bilateral 2000 & 2008    DJD knees. Both were complicated w/venous thromboembolism S/P IVC filter placement due to one being massive PE in 2008.   Social History History   Social History  . Marital Status: Divorced    Spouse Name: N/A    Number of Children: 1  . Years of Education: Hotel manager   Occupational History  . [employer]    Social History Main Topics  . Smoking status: Former Smoker -- 0.50 packs/day for 2 years    Quit date: 07/05/2006  . Smokeless tobacco: Never Used  . Alcohol Use: 0.6 - 1.2 oz/week    1-2 Shots of liquor per week  . Drug Use: Yes  . Sexual Activity: Yes   Other Topics Concern  . Not on file   Social History Narrative   He is a divorced father of one, grandfather 2. He tries to exercise, and had been doing it 6 days a week. He had a fall earlier last year inserted got out of the habit. He also working on his diet and sort of got out of that habit as well. He did quit drinking a few years ago and does not smoke.   Family History Family History  Problem Relation Age of Onset  . Cancer Mother     Pancreatic  . Arthritis Mother   . Arthritis Father   . CAD Father 41  . Cancer Brother     Colon  . Fainting Neg Hx     Current Outpatient Prescriptions on File Prior to Visit  Medication Sig Dispense Refill  . digoxin (LANOXIN) 0.125 MG tablet TAKE ONE TABLET BY MOUTH ONCE DAILY (HOLD  IF  PULSE  LESS  THAN  60)  30 tablet  0  . warfarin (COUMADIN) 5 MG tablet Take as directed by coumadin clinic.  165 tablet  1  . allopurinol (ZYLOPRIM) 100 MG tablet Take 1 tablet (100 mg total) by mouth daily.  90 tablet  3  . clotrimazole (LOTRIMIN) 1 % cream Apply topically 2 (two) times daily.  30 g  1  . desonide (DESOWEN) 0.05 % cream Apply 1 application topically daily. Apply to rash on face      . desonide (DESOWEN) 0.05 % lotion       . digoxin (LANOXIN) 0.125 MG tablet Take 1 tablet (0.125 mg total) by mouth daily.  90 tablet  3  . diltiazem (TAZTIA XT) 360 MG 24 hr capsule Take 1 capsule (360 mg total) by mouth daily.  90 capsule  3  . fluocinonide ointment (LIDEX) 6.56 % Apply 1 application topically 2 (two) times daily. Apply to dry skin areas on body      . metoprolol (LOPRESSOR) 50 MG tablet Take 1 tablet (50 mg total) by mouth 2 (two) times daily.  180 tablet  3  . ondansetron  (ZOFRAN-ODT) 8 MG disintegrating tablet Take 8 mg by mouth every 8 (eight) hours as needed.       [name] oxyCODONE-acetaminophen (PERCOCET) 10-325 MG per tablet Take 1 tablet by mouth every 6 (six) hours as needed. Pain.  30 tablet  0  . pravastatin (PRAVACHOL) 40 MG tablet Take 1 tablet (40 mg total) by mouth daily.  90 tablet  3  . triamcinolone ointment (KENALOG) 0.1 % Apply 1 application topically daily. Apply to skin rash areas on body  454 g  0  . warfarin (COUMADIN) 5 MG tablet Take as directed by anticoagulation clinic  15 tablet  0  . zolpidem (AMBIEN) 5 MG tablet Take 5 mg by mouth at bedtime as needed.        No current facility-administered medications on file prior to visit.   Allergies  Allergen Reactions  . Crestor [Rosuvastatin] Swelling    ROS: See HPI for pertinent positives and negatives.  Physical Examination  There were no vitals filed for this visit. There is no weight on file to calculate BMI.  General: A&O x 3, WD, morbidly obese  Eyes: PERRLA Pulmonary: Sym exp, good air movt, CTAB, no rales, rhonchi, & wheezing  Cardiac: RRR, Nl S1, S2, no Murmur detected  Vascular: 2+ bilateral DP pulses, 1+ bilateral PT pulses, popliteal pulses are not palpable , right femoral pulse is not palpable, left femoral pulse is 1+ palpable Gastrointestinal: soft, NTND, -G/R, - HSM, - masses palpated, - CVAT B, obese, cannot palpate AAA  Musculoskeletal: M/S 5/5 throughout , Extremities without ischemic changes  Neurologic: Pain and light touch intact in extremities , Motor exam as listed above    Non-Invasive Vascular Imaging  AAA Duplex (10/20/2013) ABDOMINAL AORTA DUPLEX EVALUATION - POST ENDOVASCULAR REPAIR    INDICATION: Evaluation of endovascular stent placement.    PREVIOUS INTERVENTION(S): Endovascular stent graft for bilateral common iliac artery aneurysms.    DUPLEX EXAM:      DIAMETER AP (cm) DIAMETER TRANSVERSE (cm) VELOCITIES (cm/sec)  Aorta 3.26  135  Right  Common Iliac 2.14 2.00   Left Common Iliac 2.39 Not visualized     Comparison Study       Date DIAMETER AP (cm) DIAMETER TRANSVERSE (cm)  04/08/2012       ADDITIONAL FINDINGS: Technically difficult exam due to patient body habitus and bowel gas patterns.    IMPRESSION: The aorta and endograft appear patent, based on limited visualization. Limited visualization of the common iliac arteries.   Official read of CTA Abd/pelvis (10/14/2012)  IMPRESSION: 1. Bifurcated infrarenal stent graft without endoleak. 2. Interval decrease in size of 18 mm left and 36 mm right common iliac artery aneurysms.   EVAR Duplex (Date: 04/08/12)  Technically limited study due to gas and habitus  R CIA: 1.97 cm  L CIA 2.80 c/s  No endoleak   Medical Decision Making  The patient is a 63 y.o. male who is s/p EVAR (Date: 08/11/11) and L IIA emb. (Date: 07/30/11) for B CIA aneurysms. Today's abdominal aortic Duplex demonstrates a technically difficult exam due to patient body habitus and bowel gas patterns. The aorta and endograft appear patent, based on limited visualization. Limited visualization of the common iliac arteries. CTA Abd/pelvis (10/14/2012) demonstrated a bifurcated infrarenal stent graft without endoleak and interval decrease in size of 18 mm left and 36 mm right common iliac artery aneurysms.  10/14/12 office visit, Dr. Lianne Moris assessment: At this this point, I would proceed with annual aortoiliac duplex (EVAR protocol) for the next 3 years (until 2016). In 3 years (2017, confirmed with Dr. Bridgett Larsson), I would repeat one last CTA to look for possible Type II endoleak as suggested by the OVER trial, given the bimodal distribution of type II endoleaks and subsequent aneurysm progression.   Based on this patient's exam and diagnostic studies, the patient will follow up in 1  year  with the following studies: EVAR Duplex.  I emphasized the importance of maximal medical management including strict control  of blood pressure, blood glucose, and lipid levels, obtaining regular exercise, and continued cessation of smoking.   The patient was given verbal information about AAA including signs, symptoms, treatment, and how to minimize the risk of enlargement and rupture of aneurysms.    The patient was advised to call 911 should the patient experience sudden onset abdominal or back pain.   Thank you for allowing [place] to participate in this patient's care.  Clemon Chambers, RN, MSN, FNP-C [facility] [place] Head Office: [phone]  Clinic Physician: Bridgett Larsson  10/20/2013, 9:16 AM

## 2013-11-07 ENCOUNTER — Encounter: Payer: Self-pay | Admitting: Internal Medicine

## 2013-11-07 ENCOUNTER — Ambulatory Visit (INDEPENDENT_AMBULATORY_CARE_PROVIDER_SITE_OTHER): Payer: Commercial Managed Care - HMO | Admitting: *Deleted

## 2013-11-07 ENCOUNTER — Ambulatory Visit (INDEPENDENT_AMBULATORY_CARE_PROVIDER_SITE_OTHER): Payer: Medicare HMO | Admitting: Internal Medicine

## 2013-11-07 ENCOUNTER — Telehealth: Payer: Self-pay | Admitting: *Deleted

## 2013-11-07 VITALS — BP 112/80 | HR 70 | Temp 98.0°F | Ht 74.0 in | Wt 331.5 lb

## 2013-11-07 DIAGNOSIS — E785 Hyperlipidemia, unspecified: Secondary | ICD-10-CM

## 2013-11-07 DIAGNOSIS — Z0000 Encounter for general adult medical examination without abnormal findings: Secondary | ICD-10-CM

## 2013-11-07 DIAGNOSIS — Z0189 Encounter for other specified special examinations: Secondary | ICD-10-CM

## 2013-11-07 DIAGNOSIS — F32A Depression, unspecified: Secondary | ICD-10-CM

## 2013-11-07 DIAGNOSIS — F329 Major depressive disorder, single episode, unspecified: Secondary | ICD-10-CM

## 2013-11-07 DIAGNOSIS — Z5181 Encounter for therapeutic drug level monitoring: Secondary | ICD-10-CM

## 2013-11-07 DIAGNOSIS — I10 Essential (primary) hypertension: Secondary | ICD-10-CM

## 2013-11-07 DIAGNOSIS — I2699 Other pulmonary embolism without acute cor pulmonale: Secondary | ICD-10-CM

## 2013-11-07 DIAGNOSIS — Z86718 Personal history of other venous thrombosis and embolism: Secondary | ICD-10-CM

## 2013-11-07 DIAGNOSIS — L409 Psoriasis, unspecified: Secondary | ICD-10-CM

## 2013-11-07 LAB — POCT INR: INR: 2.7

## 2013-11-07 MED ORDER — FLUOCINONIDE-E 0.05 % EX CREA: 1.0000 "application " | TOPICAL_CREAM | Freq: Two times a day (BID) | CUTANEOUS | Status: DC

## 2013-11-07 NOTE — Progress Notes (Signed)
Pre visit review using our clinic review tool, if applicable. No additional management support is needed unless otherwise documented below in the visit note. 

## 2013-11-07 NOTE — Patient Instructions (Signed)
Please continue all other medications as before, and call with the grams of the tub size lidex you mentioned  Please have the pharmacy call with any other refills you may need.  Please continue your efforts at being more active, low cholesterol diet, and weight control.  You are otherwise up to date with prevention measures today.  Please keep your appointments with your specialists as you may have planned  Please return in 6 months, or sooner if needed, with Lab testing done 3-5 days before

## 2013-11-07 NOTE — Assessment & Plan Note (Signed)
stable overall by history and exam, recent data reviewed with pt, and pt to continue medical treatment as before,  to f/u any worsening symptoms or concerns Lab Results  Component Value Date   LDLCALC 86 04/18/2013

## 2013-11-07 NOTE — Telephone Encounter (Signed)
Left msg on triage stating md told him to call back with name of oint. Pt states its triamcinolone oint 037 pd tube...[name]

## 2013-11-07 NOTE — Assessment & Plan Note (Signed)
stable overall by history and exam, recent data reviewed with pt, and pt to continue medical treatment as before,  to f/u any worsening symptoms or concerns BP Readings from Last 3 Encounters:  11/07/13 112/80  10/20/13 153/90  07/04/13 120/78

## 2013-11-07 NOTE — Assessment & Plan Note (Signed)
[facility] for lidex refill asd, prn use

## 2013-11-07 NOTE — Telephone Encounter (Signed)
Done erx 

## 2013-11-07 NOTE — Progress Notes (Signed)
Subjective:    Patient ID: [name], male    DOB: [birth year], 63 y.o.   MRN: [id number]  HPI  Here to f/u; overall doing ok,  Pt denies chest pain, increased sob or doe, wheezing, orthopnea, PND, increased LE swelling, palpitations, dizziness or syncope.  Pt denies polydipsia, polyuria, or low sugar symptoms such as weakness or confusion improved with po intake.  Pt denies new neurological symptoms such as new headache, or facial or extremity weakness or numbness.   Pt states overall good compliance with meds, has been trying to follow lower cholesterol diet, with wt overall stable,  but little exercise however.No complaints except Psoariasis worsening again to left arm., asks for Tub size to [place] for lidex. Denies worsening depressive symptoms, suicidal ideation, or panic  Past Medical History  Diagnosis Date  . Hypertension   . Hyperlipemia   . Obstructive sleep apnea July 2013    Not wearing CPAP. Split-Night sleep study 06/03/11 AHI = 27.43/hr (TOTAL SLEEP TIME 105 minutes) and REM AHI = 120.00/hr  . Morbid obesity with BMI of 40.0-44.9, adult   . PAH (pulmonary artery hypertension)   . Pulmonary embolism 2008    DVT --> Massive PE with right-sided strain. On coumadin. ECHO 02/28/10 showed normal LV size, moderate concentricLVH, normal EF, normal atrial sizes, RV was normal but previously dilated.  . Chronic anticoagulation, PE DVT PAF   . Morbid obesity   . HTN (hypertension)   . Dyslipidemia   . PAF (paroxysmal atrial fibrillation)/atrial flutter 02/28/2010    ECHO : normal LV size, moderate concentricLVH, normal EF, normal atrial sizes, RV was normal but previously dilated;;  Myoview : Normal perfusion study. Low risk scan.  . Prolonged QT interval   . Edema of left lower extremity     S/P I&D 08/30/06  . GI bleed 04/2011    With anemia. EGD & colonoscopy 05/11/2011  . Anemia     From GI bleed in 05/11/11.   . Iliopsoas muscle hematoma 04/2011  . AAA (abdominal aortic  aneurysm) July 2013    Abdominal Aortic Endovascular Stent Graft 08/11/11. Bilateral. Right is 3.2 & the left is 3.6. "Preclose" repair bilateral common femoral artery. Placement of catheter in aorta x 2. Repair of aorta w/modular bifurcated prosthesis w/bilateral limbs.  Placement of right limb extension: 23 mm x 12 cm. Placement of left limb extension: 14 mm x 7 cm.  . Iliac artery aneurysm, bilateral     PVD, bilat common iliac aneurysms [443.9]  . Lung nodule 2008  . Depression   . Chronic pain   . Osteoarthritis of both knees     Bilateral Arthroscopy in 2000 & 2008. Both were complicated w/venous thromboembolism S/P IVC filter placement due to one being massive PE in 2008.  [name] Psoriasis   . History of alcohol dependence    Past Surgical History  Procedure Laterality Date  . Knee surgery  1999  . Esophagogastroduodenoscopy  05/08/2011    For GI bleed/Anemia. Also had a colonoscopy at this time.  . Colonoscopy  05/11/2011    For GI bleed. Performed by Dr. Juanita Craver, MD. Also had a EGD performed at this time.  . Colonoscopy  05/12/2011  . Achilles tendon surgery    . Abdominal aortic evar  08/11/11    Bilateral. Right is 3.2 & the left is 3.6. "Preclose" repair bilateral common femoral artery. Placement of catheter in aorta x 2. Repair of aorta w/modular bifurcated prosthesis w/bilateral limbs.  Placement of right limb extension: 23 mm x 12 cm. Placement of left limb extension: 14 mm x 7 cm.  . Knee arthroscopy Bilateral 2000 & 2008    DJD knees. Both were complicated w/venous thromboembolism S/P IVC filter placement due to one being massive PE in 2008.    reports that he quit smoking about 7 years ago. He has never used smokeless tobacco. He reports that he drinks about .6 - 1.2 ounces of alcohol per week. He reports that he uses illicit drugs. family history includes Arthritis in his father and mother; CAD (age of onset: 59) in his father; Cancer in his brother and mother. There is no  history of Fainting. Allergies  Allergen Reactions  . Crestor [Rosuvastatin] Swelling   Current Outpatient Prescriptions on File Prior to Visit  Medication Sig Dispense Refill  . allopurinol (ZYLOPRIM) 100 MG tablet Take 1 tablet (100 mg total) by mouth daily.  90 tablet  3  . clotrimazole (LOTRIMIN) 1 % cream Apply topically 2 (two) times daily.  30 g  1  . desonide (DESOWEN) 0.05 % cream Apply 1 application topically daily. Apply to rash on face      . desonide (DESOWEN) 0.05 % lotion       . digoxin (LANOXIN) 0.125 MG tablet TAKE ONE TABLET BY MOUTH ONCE DAILY (HOLD  IF  PULSE  LESS  THAN  60)  30 tablet  0  . digoxin (LANOXIN) 0.125 MG tablet Take 1 tablet (0.125 mg total) by mouth daily.  90 tablet  3  . diltiazem (TAZTIA XT) 360 MG 24 hr capsule Take 1 capsule (360 mg total) by mouth daily.  90 capsule  3  . fluocinonide ointment (LIDEX) 2.70 % Apply 1 application topically 2 (two) times daily. Apply to dry skin areas on body      . metoprolol (LOPRESSOR) 50 MG tablet Take 1 tablet (50 mg total) by mouth 2 (two) times daily.  180 tablet  3  . ondansetron (ZOFRAN-ODT) 8 MG disintegrating tablet Take 8 mg by mouth every 8 (eight) hours as needed.       [name] oxyCODONE-acetaminophen (PERCOCET) 10-325 MG per tablet Take 1 tablet by mouth every 6 (six) hours as needed. Pain.  30 tablet  0  . pravastatin (PRAVACHOL) 40 MG tablet Take 1 tablet (40 mg total) by mouth daily.  90 tablet  3  . triamcinolone ointment (KENALOG) 0.1 % Apply 1 application topically daily. Apply to skin rash areas on body  454 g  0  . warfarin (COUMADIN) 5 MG tablet Take as directed by coumadin clinic.  165 tablet  1  . warfarin (COUMADIN) 5 MG tablet Take as directed by anticoagulation clinic  15 tablet  0  . zolpidem (AMBIEN) 5 MG tablet Take 5 mg by mouth at bedtime as needed.        No current facility-administered medications on file prior to visit.   Review of Systems  Constitutional: Negative for unusual  diaphoresis or other sweats  HENT: Negative for ringing in ear Eyes: Negative for double vision or worsening visual disturbance.  Respiratory: Negative for choking and stridor.   Gastrointestinal: Negative for vomiting or other signifcant bowel change Genitourinary: Negative for hematuria or decreased urine volume.  Musculoskeletal: Negative for other MSK pain or swelling Skin: Negative for color change and worsening wound.  Neurological: Negative for tremors and numbness other than noted  Psychiatric/Behavioral: Negative for decreased concentration or agitation other than above  Objective:   Physical Exam BP 112/80  Pulse 70  Temp(Src) 98 F (36.7 C) (Oral)  Ht 6\' 2"  (1.88 m)  Wt 331 lb 8 oz (150.367 kg)  BMI 42.54 kg/m2  SpO2 97% VS noted,  Constitutional: Pt appears well-developed, well-nourished.  HENT: Head: NCAT.  Right Ear: External ear normal.  Left Ear: External ear normal.  Eyes: . Pupils are equal, round, and reactive to light. Conjunctivae and EOM are normal Neck: Normal range of motion. Neck supple.  Cardiovascular: Normal rate and regular rhythm.   Pulmonary/Chest: Effort normal and breath sounds normal.  Abd:  Soft, NT, ND, + BS Neurological: Pt is alert. Not confused , motor grossly intact Skin: Skin is warm. No rash Psychiatric: Pt behavior is normal. No agitation.     Assessment & Plan:

## 2013-11-07 NOTE — Assessment & Plan Note (Signed)
stable overall by history and exam, recent data reviewed with pt, and pt to continue medical treatment as before,  to f/u any worsening symptoms or concerns Lab Results  Component Value Date   WBC 5.4 04/18/2013   HGB 15.2 04/18/2013   HCT 46.1 04/18/2013   PLT 190.0 04/18/2013   GLUCOSE 94 04/18/2013   CHOL 143 04/18/2013   TRIG 94.0 04/18/2013   HDL 38.20* 04/18/2013   LDLCALC 86 04/18/2013   ALT 21 04/18/2013   AST 22 04/18/2013   NA 139 04/18/2013   K 4.5 04/18/2013   CL 105 04/18/2013   CREATININE 1.1 04/18/2013   BUN 17 04/18/2013   CO2 27 04/18/2013   TSH 1.61 04/18/2013   PSA 0.66 04/18/2013   INR 2.7 11/07/2013

## 2013-12-21 ENCOUNTER — Encounter (HOSPITAL_COMMUNITY): Payer: Self-pay | Admitting: Vascular Surgery

## 2014-01-06 ENCOUNTER — Other Ambulatory Visit: Payer: Self-pay | Admitting: Internal Medicine

## 2014-01-24 ENCOUNTER — Ambulatory Visit (INDEPENDENT_AMBULATORY_CARE_PROVIDER_SITE_OTHER): Payer: Commercial Managed Care - HMO | Admitting: Family Medicine

## 2014-01-24 DIAGNOSIS — Z86718 Personal history of other venous thrombosis and embolism: Secondary | ICD-10-CM

## 2014-01-24 DIAGNOSIS — I2699 Other pulmonary embolism without acute cor pulmonale: Secondary | ICD-10-CM

## 2014-01-24 DIAGNOSIS — Z5181 Encounter for therapeutic drug level monitoring: Secondary | ICD-10-CM

## 2014-01-24 LAB — POCT INR: INR: 3.4

## 2014-02-13 ENCOUNTER — Telehealth: Payer: Self-pay | Admitting: Internal Medicine

## 2014-02-13 DIAGNOSIS — L409 Psoriasis, unspecified: Secondary | ICD-10-CM

## 2014-02-13 NOTE — Telephone Encounter (Signed)
Pt request referral from Dr. Jenny Reichmann to Dr. Ouida Sills  Phone [phone], dermatology, for psoriasis. Please advise, pt has an appt with Dr. Ouida Sills on 03/02/14.

## 2014-02-13 NOTE — Telephone Encounter (Signed)
Done erx 

## 2014-02-15 NOTE — Telephone Encounter (Signed)
Referral # [id number] start date 02/15/14 exp 08/14/14 good for 6 visits

## 2014-02-21 ENCOUNTER — Ambulatory Visit (INDEPENDENT_AMBULATORY_CARE_PROVIDER_SITE_OTHER): Payer: Commercial Managed Care - HMO | Admitting: General Practice

## 2014-02-21 ENCOUNTER — Ambulatory Visit: Payer: Commercial Managed Care - HMO | Admitting: Internal Medicine

## 2014-02-21 DIAGNOSIS — Z86718 Personal history of other venous thrombosis and embolism: Secondary | ICD-10-CM

## 2014-02-21 DIAGNOSIS — I2699 Other pulmonary embolism without acute cor pulmonale: Secondary | ICD-10-CM

## 2014-02-21 DIAGNOSIS — Z5181 Encounter for therapeutic drug level monitoring: Secondary | ICD-10-CM

## 2014-02-21 LAB — POCT INR: INR: 2.8

## 2014-02-21 NOTE — Progress Notes (Signed)
Pre visit review using our clinic review tool, if applicable. No additional management support is needed unless otherwise documented below in the visit note. 

## 2014-02-21 NOTE — Progress Notes (Signed)
Agree with plan 

## 2014-03-21 ENCOUNTER — Ambulatory Visit (INDEPENDENT_AMBULATORY_CARE_PROVIDER_SITE_OTHER): Payer: Commercial Managed Care - HMO | Admitting: General Practice

## 2014-03-21 DIAGNOSIS — Z5181 Encounter for therapeutic drug level monitoring: Secondary | ICD-10-CM

## 2014-03-21 DIAGNOSIS — Z86718 Personal history of other venous thrombosis and embolism: Secondary | ICD-10-CM

## 2014-03-21 DIAGNOSIS — I2699 Other pulmonary embolism without acute cor pulmonale: Secondary | ICD-10-CM

## 2014-03-21 LAB — POCT INR: INR: 1.8

## 2014-03-21 NOTE — Progress Notes (Signed)
Agree with plan 

## 2014-03-21 NOTE — Progress Notes (Signed)
Pre visit review using our clinic review tool, if applicable. No additional management support is needed unless otherwise documented below in the visit note. 

## 2014-04-18 ENCOUNTER — Ambulatory Visit (INDEPENDENT_AMBULATORY_CARE_PROVIDER_SITE_OTHER): Payer: Commercial Managed Care - HMO | Admitting: General Practice

## 2014-04-18 DIAGNOSIS — Z5181 Encounter for therapeutic drug level monitoring: Secondary | ICD-10-CM | POA: Diagnosis not present

## 2014-04-18 DIAGNOSIS — Z86718 Personal history of other venous thrombosis and embolism: Secondary | ICD-10-CM | POA: Diagnosis not present

## 2014-04-18 DIAGNOSIS — I2699 Other pulmonary embolism without acute cor pulmonale: Secondary | ICD-10-CM | POA: Diagnosis not present

## 2014-04-18 LAB — POCT INR: INR: 3

## 2014-04-18 NOTE — Progress Notes (Signed)
Agree with plan 

## 2014-04-18 NOTE — Progress Notes (Signed)
Pre visit review using our clinic review tool, if applicable. No additional management support is needed unless otherwise documented below in the visit note. 

## 2014-05-09 ENCOUNTER — Ambulatory Visit (INDEPENDENT_AMBULATORY_CARE_PROVIDER_SITE_OTHER): Payer: Commercial Managed Care - HMO | Admitting: Internal Medicine

## 2014-05-09 ENCOUNTER — Other Ambulatory Visit (INDEPENDENT_AMBULATORY_CARE_PROVIDER_SITE_OTHER): Payer: Commercial Managed Care - HMO

## 2014-05-09 ENCOUNTER — Encounter: Payer: Self-pay | Admitting: Internal Medicine

## 2014-05-09 VITALS — BP 132/88 | HR 66 | Temp 99.1°F | Resp 18 | Ht 74.0 in | Wt 326.0 lb

## 2014-05-09 DIAGNOSIS — Z0000 Encounter for general adult medical examination without abnormal findings: Secondary | ICD-10-CM

## 2014-05-09 DIAGNOSIS — Z125 Encounter for screening for malignant neoplasm of prostate: Secondary | ICD-10-CM | POA: Diagnosis not present

## 2014-05-09 LAB — CBC WITH DIFFERENTIAL/PLATELET
BASOS PCT: 0.4 % (ref 0.0–3.0)
Basophils Absolute: 0 10*3/uL (ref 0.0–0.1)
Eosinophils Absolute: 0.2 10*3/uL (ref 0.0–0.7)
Eosinophils Relative: 4.8 % (ref 0.0–5.0)
HEMATOCRIT: 44.1 % (ref 39.0–52.0)
HEMOGLOBIN: 14.7 g/dL (ref 13.0–17.0)
LYMPHS PCT: 18.1 % (ref 12.0–46.0)
Lymphs Abs: 0.9 10*3/uL (ref 0.7–4.0)
MCHC: 33.4 g/dL (ref 30.0–36.0)
MCV: 95.6 fl (ref 78.0–100.0)
MONO ABS: 0.5 10*3/uL (ref 0.1–1.0)
Monocytes Relative: 11 % (ref 3.0–12.0)
NEUTROS ABS: 3.1 10*3/uL (ref 1.4–7.7)
Neutrophils Relative %: 65.7 % (ref 43.0–77.0)
Platelets: 188 10*3/uL (ref 150.0–400.0)
RBC: 4.61 Mil/uL (ref 4.22–5.81)
RDW: 15.2 % (ref 11.5–15.5)
WBC: 4.8 10*3/uL (ref 4.0–10.5)

## 2014-05-09 LAB — LIPID PANEL
CHOLESTEROL: 138 mg/dL (ref 0–200)
HDL: 43.1 mg/dL (ref >39.00)
LDL Cholesterol: 80 mg/dL (ref 0–99)
NONHDL: 94.9
Total CHOL/HDL Ratio: 3
Triglycerides: 77 mg/dL (ref 0.0–149.0)
VLDL: 15.4 mg/dL (ref 0.0–40.0)

## 2014-05-09 LAB — URINALYSIS, ROUTINE W REFLEX MICROSCOPIC
BILIRUBIN URINE: NEGATIVE
Ketones, ur: NEGATIVE
LEUKOCYTES UA: NEGATIVE
NITRITE: NEGATIVE
Specific Gravity, Urine: >=1.03 — AB (ref 1.000–1.030)
Total Protein, Urine: NEGATIVE
UROBILINOGEN UA: 0.2 (ref 0.0–1.0)
Urine Glucose: NEGATIVE
pH: 5.5 (ref 5.0–8.0)

## 2014-05-09 LAB — BASIC METABOLIC PANEL
BUN: 16 mg/dL (ref 6–23)
CO2: 27 mEq/L (ref 19–32)
Calcium: 8.9 mg/dL (ref 8.4–10.5)
Chloride: 108 mEq/L (ref 96–112)
Creatinine, Ser: 1.46 mg/dL (ref 0.40–1.50)
GFR: 62.47 mL/min (ref >60.00)
Glucose, Bld: 86 mg/dL (ref 70–99)
POTASSIUM: 4.8 meq/L (ref 3.5–5.1)
SODIUM: 140 meq/L (ref 135–145)

## 2014-05-09 LAB — TSH: TSH: 1.45 u[IU]/mL (ref 0.35–4.50)

## 2014-05-09 LAB — HEPATIC FUNCTION PANEL
ALT: 24 U/L (ref 0–53)
AST: 21 U/L (ref 0–37)
Albumin: 3.7 g/dL (ref 3.5–5.2)
Alkaline Phosphatase: 79 U/L (ref 39–117)
Bilirubin, Direct: 0.1 mg/dL (ref 0.0–0.3)
Total Bilirubin: 0.4 mg/dL (ref 0.2–1.2)
Total Protein: 6.8 g/dL (ref 6.0–8.3)

## 2014-05-09 LAB — PSA: PSA: 0.57 ng/mL (ref 0.10–4.00)

## 2014-05-09 NOTE — Patient Instructions (Signed)

## 2014-05-09 NOTE — Progress Notes (Signed)
Subjective:    Patient ID: [name], male    DOB: 27-Mar-1950, 64 y.o.   MRN: [id number]  HPI  Here for wellness and f/u;  Overall doing ok;  Pt denies Chest pain, worsening SOB, DOE, wheezing, orthopnea, PND, worsening LE edema, palpitations, dizziness or syncope.  Pt denies neurological change such as new headache, facial or extremity weakness.  Pt denies polydipsia, polyuria, or low sugar symptoms. Pt states overall good compliance with treatment and medications, good tolerability, and has been trying to follow appropriate diet.  Pt denies worsening depressive symptoms, suicidal ideation or panic. No fever, night sweats, wt loss, loss of appetite, or other constitutional symptoms.  Pt states good ability with ADL's, has low fall risk, home safety reviewed and adequate, no other significant changes in hearing or vision, and active with exercise by using an ellipitial up to 75 min per day, but actually gained wt. Overall wt no real change. Wt Readings from Last 3 Encounters:  05/09/14 326 lb (147.873 kg)  11/07/13 331 lb 8 oz (150.367 kg)  10/20/13 327 lb (148.326 kg)   Past Medical History  Diagnosis Date  . Hypertension   . Hyperlipemia   . Obstructive sleep apnea July 2013    Not wearing CPAP. Split-Night sleep study 06/03/11 AHI = 27.43/hr (TOTAL SLEEP TIME 105 minutes) and REM AHI = 120.00/hr  . Morbid obesity with BMI of 40.0-44.9, adult   . PAH (pulmonary artery hypertension)   . Pulmonary embolism 2008    DVT --> Massive PE with right-sided strain. On coumadin. ECHO 02/28/10 showed normal LV size, moderate concentricLVH, normal EF, normal atrial sizes, RV was normal but previously dilated.  . Chronic anticoagulation, PE DVT PAF   . Morbid obesity   . HTN (hypertension)   . Dyslipidemia   . PAF (paroxysmal atrial fibrillation)/atrial flutter 02/28/2010    ECHO : normal LV size, moderate concentricLVH, normal EF, normal atrial sizes, RV was normal but previously dilated;;  Myoview  : Normal perfusion study. Low risk scan.  . Prolonged QT interval   . Edema of left lower extremity     S/P I&D 08/30/06  . GI bleed 04/2011    With anemia. EGD & colonoscopy 05/11/2011  . Anemia     From GI bleed in 05/11/11.   . Iliopsoas muscle hematoma 04/2011  . AAA (abdominal aortic aneurysm) July 2013    Abdominal Aortic Endovascular Stent Graft 08/11/11. Bilateral. Right is 3.2 & the left is 3.6. "Preclose" repair bilateral common femoral artery. Placement of catheter in aorta x 2. Repair of aorta w/modular bifurcated prosthesis w/bilateral limbs.  Placement of right limb extension: 23 mm x 12 cm. Placement of left limb extension: 14 mm x 7 cm.  . Iliac artery aneurysm, bilateral     PVD, bilat common iliac aneurysms [443.9]  . Lung nodule 2008  . Depression   . Chronic pain   . Osteoarthritis of both knees     Bilateral Arthroscopy in 2000 & 2008. Both were complicated w/venous thromboembolism S/P IVC filter placement due to one being massive PE in 2008.  [name] Psoriasis   . History of alcohol dependence    Past Surgical History  Procedure Laterality Date  . Knee surgery  1999  . Esophagogastroduodenoscopy  05/08/2011    For GI bleed/Anemia. Also had a colonoscopy at this time.  . Colonoscopy  05/11/2011    For GI bleed. Performed by Dr. Juanita Craver, MD. Also had a EGD performed at  this time.  . Colonoscopy  05/12/2011  . Achilles tendon surgery    . Abdominal aortic evar  08/11/11    Bilateral. Right is 3.2 & the left is 3.6. "Preclose" repair bilateral common femoral artery. Placement of catheter in aorta x 2. Repair of aorta w/modular bifurcated prosthesis w/bilateral limbs.  Placement of right limb extension: 23 mm x 12 cm. Placement of left limb extension: 14 mm x 7 cm.  . Knee arthroscopy Bilateral 2000 & 2008    DJD knees. Both were complicated w/venous thromboembolism S/P IVC filter placement due to one being massive PE in 2008.  . Abdominal aortagram N/A 05/14/2011    Procedure:  ABDOMINAL [name];  Surgeon: Conrad Clovis, MD;  Location: [facility] CATH LAB;  Service: Cardiovascular;  Laterality: N/A;  . Lower extremity angiogram N/A 05/14/2011    Procedure: LOWER EXTREMITY ANGIOGRAM;  Surgeon: Conrad Hitchcock, MD;  Location: [facility] CATH LAB;  Service: Cardiovascular;  Laterality: N/A;  . Insertion of vena cava filter N/A 05/14/2011    Procedure: INSERTION OF VENA CAVA FILTER;  Surgeon: Conrad [place], MD;  Location: [facility] CATH LAB;  Service: Cardiovascular;  Laterality: N/A;  . Embolization N/A 07/30/2011    Procedure: EMBOLIZATION;  Surgeon: Conrad Essex, MD;  Location: [facility] CATH LAB;  Service: Cardiovascular;  Laterality: N/A;    reports that he quit smoking about 7 years ago. He has never used smokeless tobacco. He reports that he drinks about 0.6 - 1.2 oz of alcohol per week. He reports that he uses illicit drugs. family history includes Arthritis in his father and mother; CAD (age of onset: 16) in his father; Cancer in his brother and mother. There is no history of Fainting. Allergies  Allergen Reactions  . Crestor [Rosuvastatin] Swelling   Current Outpatient Prescriptions on File Prior to Visit  Medication Sig Dispense Refill  . allopurinol (ZYLOPRIM) 100 MG tablet Take 1 tablet (100 mg total) by mouth daily. 90 tablet 3  . clotrimazole (LOTRIMIN) 1 % cream Apply topically 2 (two) times daily. 30 g 1  . desonide (DESOWEN) 0.05 % cream Apply 1 application topically daily. Apply to rash on face    . desonide (DESOWEN) 0.05 % lotion     . digoxin (LANOXIN) 0.125 MG tablet TAKE ONE TABLET BY MOUTH ONCE DAILY (HOLD  IF  PULSE  LESS  THAN  60) 30 tablet 0  . digoxin (LANOXIN) 0.125 MG tablet Take 1 tablet (0.125 mg total) by mouth daily. 90 tablet 3  . diltiazem (TAZTIA XT) 360 MG 24 hr capsule Take 1 capsule (360 mg total) by mouth daily. 90 capsule 3  . fluocinonide ointment (LIDEX) 1.61 % Apply 1 application topically 2 (two) times daily. Apply to dry skin areas on body    .  fluocinonide-emollient (LIDEX-E) 0.05 % cream Apply 1 application topically 2 (two) times daily. 454 g 0  . metoprolol (LOPRESSOR) 50 MG tablet Take 1 tablet (50 mg total) by mouth 2 (two) times daily. 180 tablet 3  . ondansetron (ZOFRAN-ODT) 8 MG disintegrating tablet Take 8 mg by mouth every 8 (eight) hours as needed.     [name] oxyCODONE-acetaminophen (PERCOCET) 10-325 MG per tablet Take 1 tablet by mouth every 6 (six) hours as needed. Pain. 30 tablet 0  . pravastatin (PRAVACHOL) 40 MG tablet Take 1 tablet (40 mg total) by mouth daily. 90 tablet 3  . triamcinolone ointment (KENALOG) 0.1 % Apply 1 application topically daily. Apply to skin rash areas on  body 454 g 0  . warfarin (COUMADIN) 5 MG tablet Take as directed by coumadin clinic. 165 tablet 1  . warfarin (COUMADIN) 5 MG tablet Take as directed by anticoagulation clinic 15 tablet 0  . warfarin (COUMADIN) 5 MG tablet Take as directed by coumadin clinic 165 tablet 1  . zolpidem (AMBIEN) 5 MG tablet Take 5 mg by mouth at bedtime as needed.      No current facility-administered medications on file prior to visit.   Review of Systems Constitutional: Negative for increased diaphoresis, other activity, appetite or siginficant weight change other than noted HENT: Negative for worsening hearing loss, ear pain, facial swelling, mouth sores and neck stiffness.   Eyes: Negative for other worsening pain, redness or visual disturbance.  Respiratory: Negative for shortness of breath and wheezing  Cardiovascular: Negative for chest pain and palpitations.  Gastrointestinal: Negative for diarrhea, blood in stool, abdominal distention or other pain Genitourinary: Negative for hematuria, flank pain or change in urine volume.  Musculoskeletal: Negative for myalgias or other joint complaints.  Skin: Negative for color change and wound or drainage.  Neurological: Negative for syncope and numbness. other than noted Hematological: Negative for adenopathy. or other  swelling Psychiatric/Behavioral: Negative for hallucinations, SI, self-injury, decreased concentration or other worsening agitation.      Objective:   Physical Exam BP 132/88 mmHg  Pulse 66  Temp(Src) 99.1 F (37.3 C) (Oral)  Resp 18  Ht 6\' 2"  (1.88 m)  Wt 326 lb (147.873 kg)  BMI 41.84 kg/m2  SpO2 97% VS noted,  Constitutional: Pt is oriented to person, place, and time. Appears well-developed and well-nourished, in no significant distress Head: Normocephalic and atraumatic.  Right Ear: External ear normal.  Left Ear: External ear normal.  Nose: Nose normal.  Mouth/Throat: Oropharynx is clear and moist.  Eyes: Conjunctivae and EOM are normal. Pupils are equal, round, and reactive to light.  Neck: Normal range of motion. Neck supple. No JVD present. No tracheal deviation present or significant neck LA or mass Cardiovascular: Normal rate, regular rhythm, normal heart sounds and intact distal pulses.   Pulmonary/Chest: Effort normal and breath sounds without rales or wheezing  Abdominal: Soft. Bowel sounds are normal. NT. No HSM  Musculoskeletal: Normal range of motion. Exhibits no edema.  Lymphadenopathy:  Has no cervical adenopathy.  Neurological: Pt is alert and oriented to person, place, and time. Pt has normal reflexes. No cranial nerve deficit. Motor grossly intact Skin: Skin is warm and dry. No rash noted.  Psychiatric:  Has normal mood and affect. Behavior is normal.     Assessment & Plan:

## 2014-05-09 NOTE — Assessment & Plan Note (Signed)

## 2014-05-16 ENCOUNTER — Ambulatory Visit: Payer: Commercial Managed Care - HMO

## 2014-06-06 ENCOUNTER — Ambulatory Visit (INDEPENDENT_AMBULATORY_CARE_PROVIDER_SITE_OTHER): Payer: Commercial Managed Care - HMO | Admitting: General Practice

## 2014-06-06 DIAGNOSIS — Z5181 Encounter for therapeutic drug level monitoring: Secondary | ICD-10-CM | POA: Diagnosis not present

## 2014-06-06 DIAGNOSIS — Z86718 Personal history of other venous thrombosis and embolism: Secondary | ICD-10-CM

## 2014-06-06 DIAGNOSIS — I2699 Other pulmonary embolism without acute cor pulmonale: Secondary | ICD-10-CM

## 2014-06-06 LAB — POCT INR: INR: 3.7

## 2014-06-06 NOTE — Progress Notes (Signed)
Pre visit review using our clinic review tool, if applicable. No additional management support is needed unless otherwise documented below in the visit note. 

## 2014-06-06 NOTE — Progress Notes (Signed)
I have reviewed and agree with the plan. 

## 2014-07-04 ENCOUNTER — Ambulatory Visit (INDEPENDENT_AMBULATORY_CARE_PROVIDER_SITE_OTHER): Payer: Commercial Managed Care - HMO | Admitting: General Practice

## 2014-07-04 DIAGNOSIS — Z86718 Personal history of other venous thrombosis and embolism: Secondary | ICD-10-CM

## 2014-07-04 DIAGNOSIS — I2699 Other pulmonary embolism without acute cor pulmonale: Secondary | ICD-10-CM | POA: Diagnosis not present

## 2014-07-04 DIAGNOSIS — Z5181 Encounter for therapeutic drug level monitoring: Secondary | ICD-10-CM | POA: Diagnosis not present

## 2014-07-04 LAB — POCT INR: INR: 3.3

## 2014-07-04 NOTE — Progress Notes (Signed)
I have reviewed and agree with the plan. 

## 2014-07-04 NOTE — Progress Notes (Signed)
Pre visit review using our clinic review tool, if applicable. No additional management support is needed unless otherwise documented below in the visit note. 

## 2014-07-05 ENCOUNTER — Other Ambulatory Visit: Payer: Self-pay | Admitting: Internal Medicine

## 2014-07-13 ENCOUNTER — Encounter: Payer: Self-pay | Admitting: Internal Medicine

## 2014-07-13 ENCOUNTER — Ambulatory Visit (INDEPENDENT_AMBULATORY_CARE_PROVIDER_SITE_OTHER): Payer: Commercial Managed Care - HMO | Admitting: Internal Medicine

## 2014-07-13 VITALS — BP 122/80 | HR 94 | Temp 98.0°F | Ht 74.0 in | Wt 327.0 lb

## 2014-07-13 DIAGNOSIS — I10 Essential (primary) hypertension: Secondary | ICD-10-CM | POA: Diagnosis not present

## 2014-07-13 DIAGNOSIS — R21 Rash and other nonspecific skin eruption: Secondary | ICD-10-CM | POA: Diagnosis not present

## 2014-07-13 DIAGNOSIS — I2721 Secondary pulmonary arterial hypertension: Secondary | ICD-10-CM

## 2014-07-13 DIAGNOSIS — I272 Other secondary pulmonary hypertension: Secondary | ICD-10-CM

## 2014-07-13 MED ORDER — KETOCONAZOLE 200 MG PO TABS: 200.0000 mg | ORAL_TABLET | Freq: Every day | ORAL | Status: DC

## 2014-07-13 MED ORDER — DOXYCYCLINE HYCLATE 100 MG PO TABS
100.0000 mg | ORAL_TABLET | Freq: Two times a day (BID) | ORAL | Status: DC
Start: 2014-07-13 — End: 2014-11-01

## 2014-07-13 NOTE — Progress Notes (Signed)
Subjective:    Patient ID: [name], male    DOB: [birth year], 63 y.o.   MRN: [id number]  HPI  Here with 4-5 days onset worsening bilat inguinal area rash, started as a nontender weepy erythem rash below the pannus to the inguinal ligament area, but then worsened pain that seemed to be better by today with freq use ot large amountsof triple antibx cream the last 3 days.  No fever, but just feels bad,  Pt denies chest pain, increased sob or doe, wheezing, orthopnea, PND, increased LE swelling, palpitations, dizziness or syncope.  Pt denies new neurological symptoms such as new headache, or facial or extremity weakness or numbness  Not on diuretic Wt Readings from Last 3 Encounters:  07/13/14 327 lb (148.326 kg)  05/09/14 326 lb (147.873 kg)  11/07/13 331 lb 8 oz (150.367 kg)   Past Medical History  Diagnosis Date  . Hypertension   . Hyperlipemia   . Obstructive sleep apnea July 2013    Not wearing CPAP. Split-Night sleep study 06/03/11 AHI = 27.43/hr (TOTAL SLEEP TIME 105 minutes) and REM AHI = 120.00/hr  . Morbid obesity with BMI of 40.0-44.9, adult   . PAH (pulmonary artery hypertension)   . Pulmonary embolism 2008    DVT --> Massive PE with right-sided strain. On coumadin. ECHO 02/28/10 showed normal LV size, moderate concentricLVH, normal EF, normal atrial sizes, RV was normal but previously dilated.  . Chronic anticoagulation, PE DVT PAF   . Morbid obesity   . HTN (hypertension)   . Dyslipidemia   . PAF (paroxysmal atrial fibrillation)/atrial flutter 02/28/2010    ECHO : normal LV size, moderate concentricLVH, normal EF, normal atrial sizes, RV was normal but previously dilated;;  Myoview : Normal perfusion study. Low risk scan.  . Prolonged QT interval   . Edema of left lower extremity     S/P I&D 08/30/06  . GI bleed 04/2011    With anemia. EGD & colonoscopy 05/11/2011  . Anemia     From GI bleed in 05/11/11.   . Iliopsoas muscle hematoma 04/2011  . AAA (abdominal aortic  aneurysm) July 2013    Abdominal Aortic Endovascular Stent Graft 08/11/11. Bilateral. Right is 3.2 & the left is 3.6. "Preclose" repair bilateral common femoral artery. Placement of catheter in aorta x 2. Repair of aorta w/modular bifurcated prosthesis w/bilateral limbs.  Placement of right limb extension: 23 mm x 12 cm. Placement of left limb extension: 14 mm x 7 cm.  . Iliac artery aneurysm, bilateral     PVD, bilat common iliac aneurysms [443.9]  . Lung nodule 2008  . Depression   . Chronic pain   . Osteoarthritis of both knees     Bilateral Arthroscopy in 2000 & 2008. Both were complicated w/venous thromboembolism S/P IVC filter placement due to one being massive PE in 2008.  [name] Psoriasis   . History of alcohol dependence    Past Surgical History  Procedure Laterality Date  . Knee surgery  1999  . Esophagogastroduodenoscopy  05/08/2011    For GI bleed/Anemia. Also had a colonoscopy at this time.  . Colonoscopy  05/11/2011    For GI bleed. Performed by Dr. Juanita Craver, MD. Also had a EGD performed at this time.  . Colonoscopy  05/12/2011  . Achilles tendon surgery    . Abdominal aortic evar  08/11/11    Bilateral. Right is 3.2 & the left is 3.6. "Preclose" repair bilateral common femoral artery. Placement of catheter  in aorta x 2. Repair of aorta w/modular bifurcated prosthesis w/bilateral limbs.  Placement of right limb extension: 23 mm x 12 cm. Placement of left limb extension: 14 mm x 7 cm.  . Knee arthroscopy Bilateral 2000 & 2008    DJD knees. Both were complicated w/venous thromboembolism S/P IVC filter placement due to one being massive PE in 2008.  . Abdominal aortagram N/A 05/14/2011    Procedure: ABDOMINAL [name];  Surgeon: Conrad [place], MD;  Location: [facility] CATH LAB;  Service: Cardiovascular;  Laterality: N/A;  . Lower extremity angiogram N/A 05/14/2011    Procedure: LOWER EXTREMITY ANGIOGRAM;  Surgeon: Conrad Van [place]ren, MD;  Location: [facility] CATH LAB;  Service: Cardiovascular;  Laterality:  N/A;  . Insertion of vena cava filter N/A 05/14/2011    Procedure: INSERTION OF VENA CAVA FILTER;  Surgeon: Conrad Wilton, MD;  Location: [facility] CATH LAB;  Service: Cardiovascular;  Laterality: N/A;  . Embolization N/A 07/30/2011    Procedure: EMBOLIZATION;  Surgeon: Conrad Sudan, MD;  Location: [facility] CATH LAB;  Service: Cardiovascular;  Laterality: N/A;    reports that he quit smoking about 8 years ago. He has never used smokeless tobacco. He reports that he drinks about 0.6 - 1.2 oz of alcohol per week. He reports that he uses illicit drugs. family history includes Arthritis in his father and mother; CAD (age of onset: 55) in his father; Cancer in his brother and mother. There is no history of Fainting. Allergies  Allergen Reactions  . Crestor [Rosuvastatin] Swelling   Current Outpatient Prescriptions on File Prior to Visit  Medication Sig Dispense Refill  . allopurinol (ZYLOPRIM) 100 MG tablet TAKE 1 TABLET EVERY DAY 90 tablet 3  . desonide (DESOWEN) 0.05 % cream Apply 1 application topically daily. Apply to rash on face    . digoxin (LANOXIN) 0.125 MG tablet TAKE ONE TABLET BY MOUTH ONCE DAILY (HOLD  IF  PULSE  LESS  THAN  60) 30 tablet 0  . metoprolol (LOPRESSOR) 50 MG tablet Take 1 tablet (50 mg total) by mouth 2 (two) times daily. 180 tablet 3  . pravastatin (PRAVACHOL) 40 MG tablet TAKE 1 TABLET EVERY DAY 90 tablet 3  . TAZTIA XT 360 MG 24 hr capsule TAKE 1 CAPSULE EVERY DAY 90 capsule 3  . warfarin (COUMADIN) 5 MG tablet Take as directed by coumadin clinic 165 tablet 1  . zolpidem (AMBIEN) 5 MG tablet Take 5 mg by mouth at bedtime as needed.     . clotrimazole (LOTRIMIN) 1 % cream Apply topically 2 (two) times daily. (Patient not taking: Reported on 07/13/2014) 30 g 1  . desonide (DESOWEN) 0.05 % lotion     . digoxin (LANOXIN) 0.125 MG tablet Take 1 tablet (0.125 mg total) by mouth daily. (Patient not taking: Reported on 07/13/2014) 90 tablet 3  . digoxin (LANOXIN) 0.125 MG tablet TAKE 1 TABLET  EVERY DAY (Patient not taking: Reported on 07/13/2014) 90 tablet 3  . fluocinonide ointment (LIDEX) 8.09 % Apply 1 application topically 2 (two) times daily. Apply to dry skin areas on body    . fluocinonide-emollient (LIDEX-E) 0.05 % cream Apply 1 application topically 2 (two) times daily. (Patient not taking: Reported on 07/13/2014) 454 g 0  . ondansetron (ZOFRAN-ODT) 8 MG disintegrating tablet Take 8 mg by mouth every 8 (eight) hours as needed.     [name] oxyCODONE-acetaminophen (PERCOCET) 10-325 MG per tablet Take 1 tablet by mouth every 6 (six) hours as needed. Pain. (Patient not  taking: Reported on 07/13/2014) 30 tablet 0  . triamcinolone ointment (KENALOG) 0.1 % Apply 1 application topically daily. Apply to skin rash areas on body (Patient not taking: Reported on 07/13/2014) 454 g 0  . warfarin (COUMADIN) 5 MG tablet Take as directed by coumadin clinic. (Patient not taking: Reported on 07/13/2014) 165 tablet 1  . warfarin (COUMADIN) 5 MG tablet Take as directed by anticoagulation clinic (Patient not taking: Reported on 07/13/2014) 15 tablet 0   No current facility-administered medications on file prior to visit.     Review of Systems  Constitutional: Negative for unusual diaphoresis or night sweats HENT: Negative for ringing in ear or discharge Eyes: Negative for double vision or worsening visual disturbance.  Respiratory: Negative for choking and stridor.   Gastrointestinal: Negative for vomiting or other signifcant bowel change Genitourinary: Negative for hematuria or change in urine volume.  Musculoskeletal: Negative for other MSK pain or swelling Skin: Negative for color change and worsening wound.  Neurological: Negative for tremors and numbness other than noted  Psychiatric/Behavioral: Negative for decreased concentration or agitation other than above       Objective:   Physical Exam BP 122/80 mmHg  Pulse 94  Temp(Src) 98 F (36.7 C) (Oral)  Ht 6\' 2"  (1.88 m)  Wt 327 lb (148.326 kg)   BMI 41.97 kg/m2  SpO2 97% VS noted,  Constitutional: Pt appears in no significant distress HENT: Head: NCAT.  Right Ear: External ear normal.  Left Ear: External ear normal.  Eyes: . Pupils are equal, round, and reactive to light. Conjunctivae and EOM are normal Neck: Normal range of motion. Neck supple.  Cardiovascular: Normal rate and regular rhythm.   Pulmonary/Chest: Effort normal and breath sounds without rales or wheezing.  Abd:  Soft, NT, ND, + BS bilat inguinal areas below pannus with large diffuse tender and nontender slightly weepy area, with some maceration on the left, no overt abscess or drainage,  Neurological: Pt is alert. Not confused , motor grossly intact Skin: Skin is warm. No rash, trace LE edema left > right Psychiatric: Pt behavior is normal. No agitation.     Assessment & Plan:

## 2014-07-13 NOTE — Assessment & Plan Note (Signed)
With stable small periph edema, hold on diuretic at this time

## 2014-07-13 NOTE — Assessment & Plan Note (Signed)
Suspect probable candidal infection with possible bacterial superinfection as well, currently afeb and VSS,  Does not appear septic, will hold on labs, but will need doxy course x 10 days, and ketoconozole 200 qd x 7 days,  to f/u any worsening symptoms or concerns

## 2014-07-13 NOTE — Progress Notes (Signed)
Pre visit review using our clinic review tool, if applicable. No additional management support is needed unless otherwise documented below in the visit note. 

## 2014-07-13 NOTE — Assessment & Plan Note (Signed)
Urged cont'd efforts at wt loss

## 2014-07-13 NOTE — Patient Instructions (Signed)
Please take all new medication as prescribed - the two antibiotics  Please continue all other medications as before, and refills have been done if requested.  Please have the pharmacy call with any other refills you may need.  Please keep your appointments with your specialists as you may have planned

## 2014-07-13 NOTE — Assessment & Plan Note (Signed)
stable overall by history and exam, recent data reviewed with pt, and pt to continue medical treatment as before,  to f/u any worsening symptoms or concerns BP Readings from Last 3 Encounters:  07/13/14 122/80  05/09/14 132/88  11/07/13 112/80

## 2014-08-01 ENCOUNTER — Ambulatory Visit: Payer: Commercial Managed Care - HMO

## 2014-08-15 ENCOUNTER — Other Ambulatory Visit: Payer: Self-pay | Admitting: Internal Medicine

## 2014-08-29 ENCOUNTER — Ambulatory Visit: Payer: Commercial Managed Care - HMO

## 2014-09-05 ENCOUNTER — Ambulatory Visit (INDEPENDENT_AMBULATORY_CARE_PROVIDER_SITE_OTHER): Payer: Commercial Managed Care - HMO | Admitting: General Practice

## 2014-09-05 DIAGNOSIS — Z86718 Personal history of other venous thrombosis and embolism: Secondary | ICD-10-CM

## 2014-09-05 DIAGNOSIS — Z5181 Encounter for therapeutic drug level monitoring: Secondary | ICD-10-CM | POA: Diagnosis not present

## 2014-09-05 DIAGNOSIS — I2699 Other pulmonary embolism without acute cor pulmonale: Secondary | ICD-10-CM | POA: Diagnosis not present

## 2014-09-05 LAB — POCT INR: INR: 2

## 2014-09-05 NOTE — Progress Notes (Signed)
I have reviewed and agree with the plan. 

## 2014-09-05 NOTE — Progress Notes (Signed)
Pre visit review using our clinic review tool, if applicable. No additional management support is needed unless otherwise documented below in the visit note. 

## 2014-10-03 ENCOUNTER — Ambulatory Visit (INDEPENDENT_AMBULATORY_CARE_PROVIDER_SITE_OTHER): Payer: Commercial Managed Care - HMO | Admitting: General Practice

## 2014-10-03 DIAGNOSIS — Z86718 Personal history of other venous thrombosis and embolism: Secondary | ICD-10-CM

## 2014-10-03 DIAGNOSIS — I2699 Other pulmonary embolism without acute cor pulmonale: Secondary | ICD-10-CM

## 2014-10-03 DIAGNOSIS — Z5181 Encounter for therapeutic drug level monitoring: Secondary | ICD-10-CM | POA: Diagnosis not present

## 2014-10-03 LAB — POCT INR: INR: 3.1

## 2014-10-03 NOTE — Progress Notes (Signed)
I have reviewed and agree with the plan. 

## 2014-10-03 NOTE — Progress Notes (Signed)
Pre visit review using our clinic review tool, if applicable. No additional management support is needed unless otherwise documented below in the visit note. 

## 2014-10-24 ENCOUNTER — Encounter: Payer: Self-pay | Admitting: Family

## 2014-10-26 ENCOUNTER — Encounter: Payer: Self-pay | Admitting: Family

## 2014-10-26 ENCOUNTER — Ambulatory Visit (HOSPITAL_COMMUNITY)
Admission: RE | Admit: 2014-10-26 | Discharge: 2014-10-26 | Disposition: A | Discharge status: Home / Self Care | Payer: Commercial Managed Care - HMO | Source: Ambulatory Visit | Attending: Family | Admitting: Family

## 2014-10-26 ENCOUNTER — Ambulatory Visit (INDEPENDENT_AMBULATORY_CARE_PROVIDER_SITE_OTHER): Payer: Commercial Managed Care - HMO | Admitting: Family

## 2014-10-26 VITALS — BP 139/88 | HR 52 | Ht 74.0 in | Wt 315.0 lb

## 2014-10-26 DIAGNOSIS — Z95828 Presence of other vascular implants and grafts: Secondary | ICD-10-CM

## 2014-10-26 DIAGNOSIS — E785 Hyperlipidemia, unspecified: Secondary | ICD-10-CM | POA: Diagnosis not present

## 2014-10-26 DIAGNOSIS — I10 Essential (primary) hypertension: Secondary | ICD-10-CM | POA: Insufficient documentation

## 2014-10-26 DIAGNOSIS — Z48812 Encounter for surgical aftercare following surgery on the circulatory system: Secondary | ICD-10-CM

## 2014-10-26 DIAGNOSIS — I723 Aneurysm of iliac artery: Secondary | ICD-10-CM

## 2014-10-26 NOTE — Progress Notes (Signed)
[facility] [place]  Established EVAR  History of Present Illness  [name] is a 64 y.o. (11/23/1950) male patient who presents for routine follow up s/p EVAR (Date: 08/11/11) and L IIA emb. (Date: 07/30/11) for B CIA aneurysms. Most recent EVAR duplex (Date: 10/20/13) demonstrates: no endoleak and shrinking iliac aneurysm sac sizes. Most recent CT (Date: 10/14/2012) demonstrates: no endoleak and decreasing iliac sac sizes to limb size. The patient has not had back or abdominal pain. The patient denies any buttock, thighs, or calves claudication, denies non healing wounds.  The patient denies back or abdominal pain. The patient is a former smoker. The patient denies history of stroke or TIA symptoms. He is trying to lose weight, is exercising 6 days/week.  Pt Diabetic: No Pt smoker: former smoker, quit in 2008  He takes coumadin for atrial fib, prescribed by his cardiologist.   Past Medical History  Diagnosis Date  . Hypertension   . Hyperlipemia   . Obstructive sleep apnea July 2013    Not wearing CPAP. Split-Night sleep study 06/03/11 AHI = 27.43/hr (TOTAL SLEEP TIME 105 minutes) and REM AHI = 120.00/hr  . Morbid obesity with BMI of 40.0-44.9, adult ([place])   . PAH (pulmonary artery hypertension) ([place])   . Pulmonary embolism ([place]) 2008    DVT --> Massive PE with right-sided strain. On coumadin. ECHO 02/28/10 showed normal LV size, moderate concentricLVH, normal EF, normal atrial sizes, RV was normal but previously dilated.  . Chronic anticoagulation, PE DVT PAF   . Morbid obesity ([place])   . HTN (hypertension)   . Dyslipidemia   . PAF (paroxysmal atrial fibrillation)/atrial flutter 02/28/2010    ECHO : normal LV size, moderate concentricLVH, normal EF, normal atrial sizes, RV was normal but previously dilated;;  Myoview : Normal perfusion study. Low risk scan.  . Prolonged QT interval   . Edema of left lower extremity     S/P I&D 08/30/06  . GI bleed 04/2011     With anemia. EGD & colonoscopy 05/11/2011  . Anemia     From GI bleed in 05/11/11.   . Iliopsoas muscle hematoma 04/2011  . AAA (abdominal aortic aneurysm) [facility]) July 2013    Abdominal Aortic Endovascular Stent Graft 08/11/11. Bilateral. Right is 3.2 & the left is 3.6. "Preclose" repair bilateral common femoral artery. Placement of catheter in aorta x 2. Repair of aorta w/modular bifurcated prosthesis w/bilateral limbs.  Placement of right limb extension: 23 mm x 12 cm. Placement of left limb extension: 14 mm x 7 cm.  . Iliac artery aneurysm, bilateral (HCC)     PVD, bilat common iliac aneurysms [443.9]  . Lung nodule 2008  . Depression   . Chronic pain   . Osteoarthritis of both knees     Bilateral Arthroscopy in 2000 & 2008. Both were complicated w/venous thromboembolism S/P IVC filter placement due to one being massive PE in 2008.  [name] Psoriasis   . History of alcohol dependence ([place])    Past Surgical History  Procedure Laterality Date  . Knee surgery  1999  . Esophagogastroduodenoscopy  05/08/2011    For GI bleed/Anemia. Also had a colonoscopy at this time.  . Colonoscopy  05/11/2011    For GI bleed. Performed by Dr. Juanita Craver, MD. Also had a EGD performed at this time.  . Colonoscopy  05/12/2011  . Achilles tendon surgery    . Abdominal aortic evar  08/11/11    Bilateral. Right is 3.2 &  the left is 3.6. "Preclose" repair bilateral common femoral artery. Placement of catheter in aorta x 2. Repair of aorta w/modular bifurcated prosthesis w/bilateral limbs.  Placement of right limb extension: 23 mm x 12 cm. Placement of left limb extension: 14 mm x 7 cm.  . Knee arthroscopy Bilateral 2000 & 2008    DJD knees. Both were complicated w/venous thromboembolism S/P IVC filter placement due to one being massive PE in 2008.  . Abdominal aortagram N/A 05/14/2011    Procedure: ABDOMINAL [name];  Surgeon: Conrad Diablo, MD;  Location: [facility] CATH LAB;  Service: Cardiovascular;  Laterality: N/A;  . Lower  extremity angiogram N/A 05/14/2011    Procedure: LOWER EXTREMITY ANGIOGRAM;  Surgeon: Conrad Burlingame, MD;  Location: [facility] CATH LAB;  Service: Cardiovascular;  Laterality: N/A;  . Insertion of vena cava filter N/A 05/14/2011    Procedure: INSERTION OF VENA CAVA FILTER;  Surgeon: Conrad F[place]ill Farms, MD;  Location: [facility] CATH LAB;  Service: Cardiovascular;  Laterality: N/A;  . Embolization N/A 07/30/2011    Procedure: EMBOLIZATION;  Surgeon: Conrad Yampa, MD;  Location: [facility] CATH LAB;  Service: Cardiovascular;  Laterality: N/A;   Social History Social History  Substance Use Topics  . Smoking status: Former Smoker -- 0.50 packs/day for 2 years    Quit date: 07/05/2006  . Smokeless tobacco: Never Used  . Alcohol Use: 0.6 - 1.2 oz/week    1-2 Shots of liquor per week   Family History Family History  Problem Relation Age of Onset  . Cancer Mother     Pancreatic  . Arthritis Mother   . Arthritis Father   . CAD Father 38  . Cancer Brother     Colon  . Fainting Neg Hx    Current Outpatient Prescriptions on File Prior to Visit  Medication Sig Dispense Refill  . allopurinol (ZYLOPRIM) 100 MG tablet TAKE 1 TABLET EVERY DAY 90 tablet 3  . clotrimazole (LOTRIMIN) 1 % cream Apply topically 2 (two) times daily. (Patient not taking: Reported on 07/13/2014) 30 g 1  . desonide (DESOWEN) 0.05 % cream Apply 1 application topically daily. Apply to rash on face    . desonide (DESOWEN) 0.05 % lotion     . digoxin (LANOXIN) 0.125 MG tablet TAKE ONE TABLET BY MOUTH ONCE DAILY (HOLD  IF  PULSE  LESS  THAN  60) 30 tablet 0  . doxycycline (VIBRA-TABS) 100 MG tablet Take 1 tablet (100 mg total) by mouth 2 (two) times daily. 20 tablet 0  . fluocinonide ointment (LIDEX) 7.85 % Apply 1 application topically 2 (two) times daily. Apply to dry skin areas on body    . fluocinonide-emollient (LIDEX-E) 0.05 % cream Apply 1 application topically 2 (two) times daily. 454 g 0  . ketoconazole (NIZORAL) 200 MG tablet Take 1 tablet (200 mg  total) by mouth daily. 7 tablet 0  . metoprolol (LOPRESSOR) 50 MG tablet TAKE 1 TABLET TWICE DAILY 180 tablet 3  . ondansetron (ZOFRAN-ODT) 8 MG disintegrating tablet Take 8 mg by mouth every 8 (eight) hours as needed.     [name] oxyCODONE-acetaminophen (PERCOCET) 10-325 MG per tablet Take 1 tablet by mouth every 6 (six) hours as needed. Pain. (Patient not taking: Reported on 07/13/2014) 30 tablet 0  . pravastatin (PRAVACHOL) 40 MG tablet TAKE 1 TABLET EVERY DAY 90 tablet 3  . TAZTIA XT 360 MG 24 hr capsule TAKE 1 CAPSULE EVERY DAY 90 capsule 3  . triamcinolone ointment (KENALOG) 0.1 % Apply  1 application topically daily. Apply to skin rash areas on body 454 g 0  . warfarin (COUMADIN) 5 MG tablet Take as directed by coumadin clinic. 165 tablet 1  . warfarin (COUMADIN) 5 MG tablet Take as directed by coumadin clinic 165 tablet 1  . warfarin (COUMADIN) 5 MG tablet TAKE AS DIRECTED BY COUMADIN CLINIC 165 tablet 1  . zolpidem (AMBIEN) 5 MG tablet Take 5 mg by mouth at bedtime as needed.      No current facility-administered medications on file prior to visit.   Allergies  Allergen Reactions  . Crestor [Rosuvastatin] Swelling     ROS: See HPI for pertinent positives and negatives.  Physical Examination  Filed Vitals:   10/26/14 0852  BP: 139/88  Pulse: 52  Height: 6\' 2"  (1.88 m)  Weight: 315 lb (142.883 kg)  SpO2: 98%   Body mass index is 40.43 kg/(m^2).  General: A&O x 3, WD, morbidly obese  Eyes: PERRLA Pulmonary: Sym exp, good air movt, CTAB, no rales, rhonchi, & wheezing  Cardiac: RRR, Nl S1, S2, no murmur detected  Vascular: 2+ bilateral DP pulses, bilateral PT pulses are not palpable, popliteal pulses are not palpable , right femoral pulse is not palpable, left femoral pulse is 1+ palpable. Aorta is not palpable. Gastrointestinal: soft, NTND, -G/R, - HSM, - masses palpated, - CVAT B, obese, large panus. Musculoskeletal: M/S 5/5 throughout , Extremities without ischemic changes   Neurologic: Pain and light touch intact in extremities , Motor exam as listed above    Non-Invasive Vascular Imaging  EVAR Duplex (Date: 10/26/2014) ABDOMINAL AORTA DUPLEX EVALUATION - POST ENDOVASCULAR REPAIR    INDICATION: Evaluation of endovascular stent repair.    PREVIOUS INTERVENTION(S): Endovascular stent graft for bilateral common iliac artery aneurysms.    DUPLEX EXAM:      DIAMETER AP (cm) DIAMETER TRANSVERSE (cm) VELOCITIES (cm/sec)  Aorta 3.90  123  Right Common Iliac 2.18 2.25 89  Left Common Iliac 1.90 1.93 96    Comparison Study       Date DIAMETER AP (cm) DIAMETER TRANSVERSE (cm)  10/20/2013         Right CIA                            Left   CIA 2.14 2.39 2.00 Not Visualized      ADDITIONAL FINDINGS: Limited visualization due to patient body habitus and bowel gas.    IMPRESSION: The aorta and endograft appear patent, based on limited visualization. The common iliac arteries were difficult to visualize.    Compared to the previous exam:  No significant change in comparison to the last exam on 10/20/2013.     Official read of CTA Abd/pelvis (10/14/2012)  IMPRESSION: 1. Bifurcated infrarenal stent graft without endoleak. 2. Interval decrease in size of 18 mm left and 36 mm right common iliac artery aneurysms.   Medical Decision Making  [name] is a 64 y.o. male who presents s/p EVAR (Date: 08/11/11) and L IIA emb. (Date: 07/30/11) for B CIA aneurysms.  Pt is asymptomatic. Today's abdominal aortic duplex suggests the aorta and endograft appear patent, based on limited visualization. The common iliac arteries were difficult to visualize. No significant change in comparison to the last exam on 10/20/2013.   Dr. Lianne Moris 10/14/12 assessment:   annual aortoiliac duplex (EVAR protocol) for the next 3 years.  In 3 years, I would repeat one last CTA to look for possible  Type II endoleak as suggested by the OVER trial, given the bimodal distribution of  type II endoleaks and subsequent aneurysm progression.  I discussed with the patient the importance of surveillance of the endograft.  The next endograft duplex will be scheduled for 12 months.  The next CTA will be scheduled for 24 months.  The patient will follow up with [place] in 12 months with these studies.  I emphasized the importance of maximal medical management including strict control of blood pressure, blood glucose, and lipid levels, antiplatelet agents, obtaining regular exercise, and cessation of smoking.   Thank you for allowing [place] to participate in this patient's care.  Clemon Chambers, RN, MSN, FNP-C [facility] [place] Office: [facility] Physician: Trula Slade  10/26/2014, 8:59 AM

## 2014-10-30 ENCOUNTER — Ambulatory Visit (INDEPENDENT_AMBULATORY_CARE_PROVIDER_SITE_OTHER): Payer: Commercial Managed Care - HMO | Admitting: Internal Medicine

## 2014-10-30 DIAGNOSIS — Z0289 Encounter for other administrative examinations: Secondary | ICD-10-CM

## 2014-10-30 DIAGNOSIS — I10 Essential (primary) hypertension: Secondary | ICD-10-CM

## 2014-10-31 ENCOUNTER — Ambulatory Visit: Payer: Commercial Managed Care - HMO

## 2014-10-31 NOTE — Progress Notes (Signed)
Opened in error

## 2014-10-31 NOTE — Assessment & Plan Note (Signed)
Pt not seen, note opened in error, no charge

## 2014-11-01 ENCOUNTER — Encounter: Payer: Self-pay | Admitting: Internal Medicine

## 2014-11-01 ENCOUNTER — Ambulatory Visit (INDEPENDENT_AMBULATORY_CARE_PROVIDER_SITE_OTHER): Payer: Commercial Managed Care - HMO | Admitting: Internal Medicine

## 2014-11-01 VITALS — BP 102/78 | HR 80 | Temp 98.6°F | Wt 309.0 lb

## 2014-11-01 DIAGNOSIS — I10 Essential (primary) hypertension: Secondary | ICD-10-CM

## 2014-11-01 DIAGNOSIS — Z23 Encounter for immunization: Secondary | ICD-10-CM | POA: Diagnosis not present

## 2014-11-01 DIAGNOSIS — J029 Acute pharyngitis, unspecified: Secondary | ICD-10-CM | POA: Diagnosis not present

## 2014-11-01 DIAGNOSIS — N529 Male erectile dysfunction, unspecified: Secondary | ICD-10-CM | POA: Diagnosis not present

## 2014-11-01 MED ORDER — AZITHROMYCIN 250 MG PO TABS: ORAL_TABLET | ORAL | Status: DC

## 2014-11-01 MED ORDER — SILDENAFIL CITRATE 100 MG PO TABS: 50.0000 mg | ORAL_TABLET | Freq: Every day | ORAL | Status: DC | PRN

## 2014-11-01 NOTE — Progress Notes (Signed)
Patient received education resource, including the self-management goal and tool. Patient verbalized understanding. 

## 2014-11-01 NOTE — Patient Instructions (Addendum)
You had the flu shot today  Please take all new medication as prescribed - the antibiotic  Please continue all other medications as before - the viagra  Please have the pharmacy call with any other refills you may need.  Please continue your efforts at being more active, low cholesterol diet, and weight control.  Please keep your appointments with your specialists as you may have planned

## 2014-11-01 NOTE — Progress Notes (Signed)
Subjective:    Patient ID: [name], male    DOB: June 15, 1950, 64 y.o.   MRN: [id number]  HPI   Here with 2 daysacute onset fever, severe ST, facial pain, pressure, headache, general weakness and malaise, and greenish d/c, with mild ST and cough, but pt denies chest pain, wheezing, increased sob or doe, orthopnea, PND, increased LE swelling, palpitations, dizziness or syncope. Pt denies new neurological symptoms such as new headache, or facial or extremity weakness or numbness   Pt denies polydipsia, polyuria,  Does have recent worsening ED symptoms, asks for med refill.  Has been able to lose several lbs recently.   Wt Readings from Last 3 Encounters:  11/01/14 309 lb (140.161 kg)  10/26/14 315 lb (142.883 kg)  07/13/14 327 lb (148.326 kg)   Past Medical History  Diagnosis Date  . Hypertension   . Hyperlipemia   . Obstructive sleep apnea July 2013    Not wearing CPAP. Split-Night sleep study 06/03/11 AHI = 27.43/hr (TOTAL SLEEP TIME 105 minutes) and REM AHI = 120.00/hr  . Morbid obesity with BMI of 40.0-44.9, adult ([place])   . PAH (pulmonary artery hypertension) ([place])   . Pulmonary embolism ([place]) 2008    DVT --> Massive PE with right-sided strain. On coumadin. ECHO 02/28/10 showed normal LV size, moderate concentricLVH, normal EF, normal atrial sizes, RV was normal but previously dilated.  . Chronic anticoagulation, PE DVT PAF   . Morbid obesity ([place])   . HTN (hypertension)   . Dyslipidemia   . PAF (paroxysmal atrial fibrillation)/atrial flutter 02/28/2010    ECHO : normal LV size, moderate concentricLVH, normal EF, normal atrial sizes, RV was normal but previously dilated;;  Myoview : Normal perfusion study. Low risk scan.  . Prolonged QT interval   . Edema of left lower extremity     S/P I&D 08/30/06  . GI bleed 04/2011    With anemia. EGD & colonoscopy 05/11/2011  . Anemia     From GI bleed in 05/11/11.   . Iliopsoas muscle hematoma 04/2011  . AAA (abdominal aortic aneurysm) [facility])  July 2013    Abdominal Aortic Endovascular Stent Graft 08/11/11. Bilateral. Right is 3.2 & the left is 3.6. "Preclose" repair bilateral common femoral artery. Placement of catheter in aorta x 2. Repair of aorta w/modular bifurcated prosthesis w/bilateral limbs.  Placement of right limb extension: 23 mm x 12 cm. Placement of left limb extension: 14 mm x 7 cm.  . Iliac artery aneurysm, bilateral (HCC)     PVD, bilat common iliac aneurysms [443.9]  . Lung nodule 2008  . Depression   . Chronic pain   . Osteoarthritis of both knees     Bilateral Arthroscopy in 2000 & 2008. Both were complicated w/venous thromboembolism S/P IVC filter placement due to one being massive PE in 2008.  [name] Psoriasis   . History of alcohol dependence ([place])    Past Surgical History  Procedure Laterality Date  . Knee surgery  1999  . Esophagogastroduodenoscopy  05/08/2011    For GI bleed/Anemia. Also had a colonoscopy at this time.  . Colonoscopy  05/11/2011    For GI bleed. Performed by Dr. Juanita Craver, MD. Also had a EGD performed at this time.  . Colonoscopy  05/12/2011  . Achilles tendon surgery    . Abdominal aortic evar  08/11/11    Bilateral. Right is 3.2 & the left is 3.6. "Preclose" repair bilateral common femoral artery. Placement of catheter in aorta x  2. Repair of aorta w/modular bifurcated prosthesis w/bilateral limbs.  Placement of right limb extension: 23 mm x 12 cm. Placement of left limb extension: 14 mm x 7 cm.  . Knee arthroscopy Bilateral 2000 & 2008    DJD knees. Both were complicated w/venous thromboembolism S/P IVC filter placement due to one being massive PE in 2008.  . Abdominal aortagram N/A 05/14/2011    Procedure: ABDOMINAL [name];  Surgeon: Conrad Porters Neck, MD;  Location: [facility] CATH LAB;  Service: Cardiovascular;  Laterality: N/A;  . Lower extremity angiogram N/A 05/14/2011    Procedure: LOWER EXTREMITY ANGIOGRAM;  Surgeon: Conrad [place], MD;  Location[facility] CATH LAB;  Service: Cardiovascular;  Laterality:  N/A;  . Insertion of vena cava filter N/A 05/14/2011    Procedure: INSERTION OF VENA CAVA FILTER;  Surgeon: Conrad Davison, MD;  Location: [facility] CATH LAB;  Service: Cardiovascular;  Laterality: N/A;  . Embolization N/A 07/30/2011    Procedure: EMBOLIZATION;  Surgeon: Conrad [place]uras, MD;  Location: [facility] CATH LAB;  Service: Cardiovascular;  Laterality: N/A;    reports that he quit smoking about 8 years ago. He has never used smokeless tobacco. He reports that he drinks about 0.6 - 1.2 oz of alcohol per week. He reports that he uses illicit drugs. family history includes Arthritis in his father and mother; CAD (age of onset: 14) in his father; Cancer in his brother and mother. There is no history of Fainting. Allergies  Allergen Reactions  . Crestor [Rosuvastatin] Swelling   Current Outpatient Prescriptions on File Prior to Visit  Medication Sig Dispense Refill  . allopurinol (ZYLOPRIM) 100 MG tablet TAKE 1 TABLET EVERY DAY 90 tablet 3  . clotrimazole (LOTRIMIN) 1 % cream Apply topically 2 (two) times daily. 30 g 1  . desonide (DESOWEN) 0.05 % cream Apply 1 application topically daily. Apply to rash on face    . desonide (DESOWEN) 0.05 % lotion     . digoxin (LANOXIN) 0.125 MG tablet TAKE ONE TABLET BY MOUTH ONCE DAILY (HOLD  IF  PULSE  LESS  THAN  60) 30 tablet 0  . fluocinonide ointment (LIDEX) 5.36 % Apply 1 application topically 2 (two) times daily. Apply to dry skin areas on body    . fluocinonide-emollient (LIDEX-E) 0.05 % cream Apply 1 application topically 2 (two) times daily. 454 g 0  . ketoconazole (NIZORAL) 200 MG tablet Take 1 tablet (200 mg total) by mouth daily. 7 tablet 0  . metoprolol (LOPRESSOR) 50 MG tablet TAKE 1 TABLET TWICE DAILY 180 tablet 3  . ondansetron (ZOFRAN-ODT) 8 MG disintegrating tablet Take 8 mg by mouth every 8 (eight) hours as needed.     [name] oxyCODONE-acetaminophen (PERCOCET) 10-325 MG per tablet Take 1 tablet by mouth every 6 (six) hours as needed. Pain. 30 tablet 0  .  pravastatin (PRAVACHOL) 40 MG tablet TAKE 1 TABLET EVERY DAY 90 tablet 3  . TAZTIA XT 360 MG 24 hr capsule TAKE 1 CAPSULE EVERY DAY 90 capsule 3  . triamcinolone ointment (KENALOG) 0.1 % Apply 1 application topically daily. Apply to skin rash areas on body 454 g 0  . warfarin (COUMADIN) 5 MG tablet TAKE AS DIRECTED BY COUMADIN CLINIC 165 tablet 1  . zolpidem (AMBIEN) 5 MG tablet Take 5 mg by mouth at bedtime as needed.      No current facility-administered medications on file prior to visit.     Review of Systems  Constitutional: Negative for unusual diaphoresis or night  sweats HENT: Negative for ringing in ear or discharge Eyes: Negative for double vision or worsening visual disturbance.  Respiratory: Negative for choking and stridor.   Gastrointestinal: Negative for vomiting or other signifcant bowel change Genitourinary: Negative for hematuria or change in urine volume.  Musculoskeletal: Negative for other MSK pain or swelling Skin: Negative for color change and worsening wound.  Neurological: Negative for tremors and numbness other than noted  Psychiatric/Behavioral: Negative for decreased concentration or agitation other than above       Objective:   Physical Exam BP 102/78 mmHg  Pulse 80  Temp(Src) 98.6 F (37 C)  Wt 309 lb (140.161 kg)  SpO2 99% VS noted, mild ill Constitutional: Pt appears in no significant distress HENT: Head: NCAT.  Right Ear: External ear normal.  Left Ear: External ear normal.  Bilat tm's with mild erythema.  Max sinus areas non tender.  Pharynx with mild erythema, no exudate Eyes: . Pupils are equal, round, and reactive to light. Conjunctivae and EOM are normal Neck: Normal range of motion. Neck supple.  Cardiovascular: Normal rate and regular rhythm.   Pulmonary/Chest: Effort normal and breath sounds without rales or wheezing.  Neurological: Pt is alert. Not confused , motor grossly intact Skin: Skin is warm. No rash, no LE edema Psychiatric:  Pt behavior is normal. No agitation.      Assessment & Plan:

## 2014-11-03 DIAGNOSIS — N529 Male erectile dysfunction, unspecified: Secondary | ICD-10-CM | POA: Insufficient documentation

## 2014-11-03 NOTE — Assessment & Plan Note (Signed)
stable overall by history and exam, recent data reviewed with pt, and pt to continue medical treatment as before,  to f/u any worsening symptoms or concerns BP Readings from Last 3 Encounters:  11/01/14 102/78  10/26/14 139/88  07/13/14 122/80

## 2014-11-03 NOTE — Assessment & Plan Note (Signed)
Mild to mod, for viagra prn,  to f/u any worsening symptoms or concerns 

## 2014-11-03 NOTE — Assessment & Plan Note (Signed)
Mild to mod, for antibx course,  to f/u any worsening symptoms or concerns 

## 2014-11-07 ENCOUNTER — Ambulatory Visit (INDEPENDENT_AMBULATORY_CARE_PROVIDER_SITE_OTHER): Payer: Commercial Managed Care - HMO | Admitting: General Practice

## 2014-11-07 DIAGNOSIS — Z86718 Personal history of other venous thrombosis and embolism: Secondary | ICD-10-CM

## 2014-11-07 DIAGNOSIS — Z5181 Encounter for therapeutic drug level monitoring: Secondary | ICD-10-CM | POA: Diagnosis not present

## 2014-11-07 LAB — POCT INR: INR: 1.5

## 2014-11-07 NOTE — Progress Notes (Signed)
Pre visit review using our clinic review tool, if applicable. No additional management support is needed unless otherwise documented below in the visit note. 

## 2014-11-08 NOTE — Progress Notes (Signed)
I have reviewed and agree with the plan. 

## 2014-11-21 ENCOUNTER — Ambulatory Visit (INDEPENDENT_AMBULATORY_CARE_PROVIDER_SITE_OTHER): Payer: Commercial Managed Care - HMO | Admitting: General Practice

## 2014-11-21 DIAGNOSIS — Z5181 Encounter for therapeutic drug level monitoring: Secondary | ICD-10-CM | POA: Diagnosis not present

## 2014-11-21 DIAGNOSIS — Z86718 Personal history of other venous thrombosis and embolism: Secondary | ICD-10-CM

## 2014-11-21 DIAGNOSIS — I2699 Other pulmonary embolism without acute cor pulmonale: Secondary | ICD-10-CM | POA: Diagnosis not present

## 2014-11-21 LAB — POCT INR: INR: 2.1

## 2014-11-21 NOTE — Progress Notes (Signed)
Pre visit review using our clinic review tool, if applicable. No additional management support is needed unless otherwise documented below in the visit note. 

## 2014-11-21 NOTE — Progress Notes (Signed)
I have reviewed and agree with the plan. 

## 2014-12-19 ENCOUNTER — Encounter: Payer: Self-pay | Admitting: Internal Medicine

## 2014-12-19 ENCOUNTER — Ambulatory Visit (INDEPENDENT_AMBULATORY_CARE_PROVIDER_SITE_OTHER): Payer: Commercial Managed Care - HMO | Admitting: General Practice

## 2014-12-19 ENCOUNTER — Ambulatory Visit (INDEPENDENT_AMBULATORY_CARE_PROVIDER_SITE_OTHER): Payer: Commercial Managed Care - HMO | Admitting: Internal Medicine

## 2014-12-19 VITALS — BP 126/80 | HR 92 | Temp 97.9°F | Ht 74.0 in | Wt 310.0 lb

## 2014-12-19 DIAGNOSIS — I4892 Unspecified atrial flutter: Secondary | ICD-10-CM | POA: Diagnosis not present

## 2014-12-19 DIAGNOSIS — I10 Essential (primary) hypertension: Secondary | ICD-10-CM | POA: Diagnosis not present

## 2014-12-19 DIAGNOSIS — Z86718 Personal history of other venous thrombosis and embolism: Secondary | ICD-10-CM | POA: Diagnosis not present

## 2014-12-19 DIAGNOSIS — J069 Acute upper respiratory infection, unspecified: Secondary | ICD-10-CM

## 2014-12-19 DIAGNOSIS — Z5181 Encounter for therapeutic drug level monitoring: Secondary | ICD-10-CM | POA: Diagnosis not present

## 2014-12-19 DIAGNOSIS — I2699 Other pulmonary embolism without acute cor pulmonale: Secondary | ICD-10-CM | POA: Diagnosis not present

## 2014-12-19 LAB — POCT INR: INR: 1.9

## 2014-12-19 MED ORDER — LEVOFLOXACIN 250 MG PO TABS: 250.0000 mg | ORAL_TABLET | Freq: Every day | ORAL | Status: DC

## 2014-12-19 NOTE — Assessment & Plan Note (Signed)
stable overall by history and exam, recent data reviewed with pt, and pt to continue medical treatment as before,  to f/u any worsening symptoms or concerns BP Readings from Last 3 Encounters:  12/19/14 126/80  11/01/14 102/78  10/26/14 139/88

## 2014-12-19 NOTE — Progress Notes (Signed)
I have reviewed and agree with the plan. 

## 2014-12-19 NOTE — Progress Notes (Signed)
Pre visit review using our clinic review tool, if applicable. No additional management support is needed unless otherwise documented below in the visit note. 

## 2014-12-19 NOTE — Assessment & Plan Note (Signed)
Mild to mod, for antibx course,  to f/u any worsening symptoms or concerns 

## 2014-12-19 NOTE — Progress Notes (Signed)
Subjective:    Patient ID: [name], male    DOB: [birth year], 64 y.o.   MRN: [id number]  HPI   Here with 2-3 days acute onset fever, facial pain, pressure, headache, general weakness and malaise, and greenish d/c, with mild ST and cough, but pt denies chest pain, wheezing, increased sob or doe, orthopnea, PND, increased LE swelling, palpitations, dizziness or syncope. Lost 5 lbs since oct with better diet and exercise.  Pt denies new neurological symptoms such as new headache, or facial or extremity weakness or numbness   Pt denies polydipsia, polyuria, Wt Readings from Last 3 Encounters:  12/19/14 310 lb (140.615 kg)  11/01/14 309 lb (140.161 kg)  10/26/14 315 lb (142.883 kg)   Past Medical History  Diagnosis Date  . Hypertension   . Hyperlipemia   . Obstructive sleep apnea July 2013    Not wearing CPAP. Split-Night sleep study 06/03/11 AHI = 27.43/hr (TOTAL SLEEP TIME 105 minutes) and REM AHI = 120.00/hr  . Morbid obesity with BMI of 40.0-44.9, adult ([place])   . PAH (pulmonary artery hypertension) ([place])   . Pulmonary embolism ([place]) 2008    DVT --> Massive PE with right-sided strain. On coumadin. ECHO 02/28/10 showed normal LV size, moderate concentricLVH, normal EF, normal atrial sizes, RV was normal but previously dilated.  . Chronic anticoagulation, PE DVT PAF   . Morbid obesity ([place])   . HTN (hypertension)   . Dyslipidemia   . PAF (paroxysmal atrial fibrillation)/atrial flutter 02/28/2010    ECHO : normal LV size, moderate concentricLVH, normal EF, normal atrial sizes, RV was normal but previously dilated;;  Myoview : Normal perfusion study. Low risk scan.  . Prolonged QT interval   . Edema of left lower extremity     S/P I&D 08/30/06  . GI bleed 04/2011    With anemia. EGD & colonoscopy 05/11/2011  . Anemia     From GI bleed in 05/11/11.   . Iliopsoas muscle hematoma 04/2011  . AAA (abdominal aortic aneurysm) [facility]) July 2013    Abdominal Aortic Endovascular Stent Graft  08/11/11. Bilateral. Right is 3.2 & the left is 3.6. "Preclose" repair bilateral common femoral artery. Placement of catheter in aorta x 2. Repair of aorta w/modular bifurcated prosthesis w/bilateral limbs.  Placement of right limb extension: 23 mm x 12 cm. Placement of left limb extension: 14 mm x 7 cm.  . Iliac artery aneurysm, bilateral (HCC)     PVD, bilat common iliac aneurysms [443.9]  . Lung nodule 2008  . Depression   . Chronic pain   . Osteoarthritis of both knees     Bilateral Arthroscopy in 2000 & 2008. Both were complicated w/venous thromboembolism S/P IVC filter placement due to one being massive PE in 2008.  [name] Psoriasis   . History of alcohol dependence ([place])    Past Surgical History  Procedure Laterality Date  . Knee surgery  1999  . Esophagogastroduodenoscopy  05/08/2011    For GI bleed/Anemia. Also had a colonoscopy at this time.  . Colonoscopy  05/11/2011    For GI bleed. Performed by Dr. Juanita Craver, MD. Also had a EGD performed at this time.  . Colonoscopy  05/12/2011  . Achilles tendon surgery    . Abdominal aortic evar  08/11/11    Bilateral. Right is 3.2 & the left is 3.6. "Preclose" repair bilateral common femoral artery. Placement of catheter in aorta x 2. Repair of aorta w/modular bifurcated prosthesis w/bilateral limbs.  Placement of  right limb extension: 23 mm x 12 cm. Placement of left limb extension: 14 mm x 7 cm.  . Knee arthroscopy Bilateral 2000 & 2008    DJD knees. Both were complicated w/venous thromboembolism S/P IVC filter placement due to one being massive PE in 2008.  . Abdominal aortagram N/A 05/14/2011    Procedure: ABDOMINAL [name];  Surgeon: Conrad [place], MD;  Location: [facility] CATH LAB;  Service: Cardiovascular;  Laterality: N/A;  . Lower extremity angiogram N/A 05/14/2011    Procedure: LOWER EXTREMITY ANGIOGRAM;  Surgeon: Conrad [place], MD;  Location: [facility] CATH LAB;  Service: Cardiovascular;  Laterality: N/A;  . Insertion of vena cava filter N/A 05/14/2011     Procedure: INSERTION OF VENA CAVA FILTER;  Surgeon: Conrad [place], MD;  Location: [facility] CATH LAB;  Service: Cardiovascular;  Laterality: N/A;  . Embolization N/A 07/30/2011    Procedure: EMBOLIZATION;  Surgeon: Conrad Morrow, MD;  Location: [facility] CATH LAB;  Service: Cardiovascular;  Laterality: N/A;    reports that he quit smoking about 8 years ago. He has never used smokeless tobacco. He reports that he drinks about 0.6 - 1.2 oz of alcohol per week. He reports that he uses illicit drugs. family history includes Arthritis in his father and mother; CAD (age of onset: 85) in his father; Cancer in his brother and mother. There is no history of Fainting. Allergies  Allergen Reactions  . Crestor [Rosuvastatin] Swelling   Current Outpatient Prescriptions on File Prior to Visit  Medication Sig Dispense Refill  . allopurinol (ZYLOPRIM) 100 MG tablet TAKE 1 TABLET EVERY DAY 90 tablet 3  . clotrimazole (LOTRIMIN) 1 % cream Apply topically 2 (two) times daily. 30 g 1  . desonide (DESOWEN) 0.05 % cream Apply 1 application topically daily. Apply to rash on face    . digoxin (LANOXIN) 0.125 MG tablet TAKE ONE TABLET BY MOUTH ONCE DAILY (HOLD  IF  PULSE  LESS  THAN  60) 30 tablet 0  . fluocinonide ointment (LIDEX) AB-123456789 % Apply 1 application topically 2 (two) times daily. Apply to dry skin areas on body    . fluocinonide-emollient (LIDEX-E) 0.05 % cream Apply 1 application topically 2 (two) times daily. 454 g 0  . ketoconazole (NIZORAL) 200 MG tablet Take 1 tablet (200 mg total) by mouth daily. 7 tablet 0  . metoprolol (LOPRESSOR) 50 MG tablet TAKE 1 TABLET TWICE DAILY 180 tablet 3  . ondansetron (ZOFRAN-ODT) 8 MG disintegrating tablet Take 8 mg by mouth every 8 (eight) hours as needed.     . pravastatin (PRAVACHOL) 40 MG tablet TAKE 1 TABLET EVERY DAY 90 tablet 3  . sildenafil (VIAGRA) 100 MG tablet Take 0.5-1 tablets (50-100 mg total) by mouth daily as needed for erectile dysfunction. 5 tablet 11  . TAZTIA XT 360 MG  24 hr capsule TAKE 1 CAPSULE EVERY DAY 90 capsule 3  . triamcinolone ointment (KENALOG) 0.1 % Apply 1 application topically daily. Apply to skin rash areas on body 454 g 0  . warfarin (COUMADIN) 5 MG tablet TAKE AS DIRECTED BY COUMADIN CLINIC 165 tablet 1  . zolpidem (AMBIEN) 5 MG tablet Take 5 mg by mouth at bedtime as needed.     [name] oxyCODONE-acetaminophen (PERCOCET) 10-325 MG per tablet Take 1 tablet by mouth every 6 (six) hours as needed. Pain. (Patient not taking: Reported on 12/19/2014) 30 tablet 0   No current facility-administered medications on file prior to visit.   Review of Systems  Constitutional:  Negative for unusual diaphoresis or night sweats HENT: Negative for ringing in ear or discharge Eyes: Negative for double vision or worsening visual disturbance.  Respiratory: Negative for choking and stridor.   Gastrointestinal: Negative for vomiting or other signifcant bowel change Genitourinary: Negative for hematuria or change in urine volume.  Musculoskeletal: Negative for other MSK pain or swelling Skin: Negative for color change and worsening wound.  Neurological: Negative for tremors and numbness other than noted  Psychiatric/Behavioral: Negative for decreased concentration or agitation other than above       Objective:   Physical Exam BP 126/80 mmHg  Pulse 92  Temp(Src) 97.9 F (36.6 C) (Oral)  Ht 6\' 2"  (1.88 m)  Wt 310 lb (140.615 kg)  BMI 39.78 kg/m2  SpO2 97% VS noted, mild ill Constitutional: Pt appears in no significant distress HENT: Head: NCAT.  Right Ear: External ear normal.  Left Ear: External ear normal.  Bilat tm's with mild erythema.  Max sinus areas mild tender.  Pharynx with mild erythema, no exudate Eyes: . Pupils are equal, round, and reactive to light. Conjunctivae and EOM are normal Neck: Normal range of motion. Neck supple.  Cardiovascular: Normal rate and irregular rhythm.   Pulmonary/Chest: Effort normal and breath sounds without rales or  wheezing.  Neurological: Pt is alert. Not confused , motor grossly intact Skin: Skin is warm. No rash, no LE edema Psychiatric: Pt behavior is normal. No agitation.     Assessment & Plan:

## 2014-12-19 NOTE — Patient Instructions (Signed)
Please take all new medication as prescribed - the antibiotic  Please continue all other medications as before, and refills have been done if requested.  Please have the pharmacy call with any other refills you may need.  Please continue your efforts at being more active, low cholesterol diet, and weight control.  You are otherwise up to date with prevention measures today.  Please keep your appointments with your specialists as you may have planned  Please remember to sign up for MyChart if you have not done so, as this will be important to you in the future with finding out test results, communicating by private email, and scheduling acute appointments online when needed.

## 2014-12-19 NOTE — Assessment & Plan Note (Signed)
With irreg rhythm today, ecg deferred, pt unaware, denies symptoms/tolerates well on current meds, rate controlled, cont coumadin

## 2015-01-16 ENCOUNTER — Ambulatory Visit: Payer: Commercial Managed Care - HMO

## 2015-01-30 ENCOUNTER — Ambulatory Visit: Payer: Commercial Managed Care - HMO

## 2015-02-13 ENCOUNTER — Ambulatory Visit (INDEPENDENT_AMBULATORY_CARE_PROVIDER_SITE_OTHER): Payer: Commercial Managed Care - HMO | Admitting: General Practice

## 2015-02-13 DIAGNOSIS — Z86718 Personal history of other venous thrombosis and embolism: Secondary | ICD-10-CM | POA: Diagnosis not present

## 2015-02-13 DIAGNOSIS — Z5181 Encounter for therapeutic drug level monitoring: Secondary | ICD-10-CM

## 2015-02-13 DIAGNOSIS — I2699 Other pulmonary embolism without acute cor pulmonale: Secondary | ICD-10-CM

## 2015-02-13 LAB — POCT INR: INR: 2.2

## 2015-02-13 NOTE — Progress Notes (Signed)
I have reviewed and agree with the plan. 

## 2015-02-13 NOTE — Progress Notes (Signed)
Pre visit review using our clinic review tool, if applicable. No additional management support is needed unless otherwise documented below in the visit note. 

## 2015-03-13 ENCOUNTER — Ambulatory Visit (INDEPENDENT_AMBULATORY_CARE_PROVIDER_SITE_OTHER): Payer: Commercial Managed Care - HMO | Admitting: General Practice

## 2015-03-13 DIAGNOSIS — Z5181 Encounter for therapeutic drug level monitoring: Secondary | ICD-10-CM | POA: Diagnosis not present

## 2015-03-13 DIAGNOSIS — I2699 Other pulmonary embolism without acute cor pulmonale: Secondary | ICD-10-CM

## 2015-03-13 DIAGNOSIS — Z86718 Personal history of other venous thrombosis and embolism: Secondary | ICD-10-CM | POA: Diagnosis not present

## 2015-03-13 LAB — POCT INR: INR: 2.2

## 2015-03-13 NOTE — Progress Notes (Signed)
I have reviewed and agree with the plan. 

## 2015-03-13 NOTE — Progress Notes (Signed)
Pre visit review using our clinic review tool, if applicable. No additional management support is needed unless otherwise documented below in the visit note. 

## 2015-03-19 ENCOUNTER — Encounter: Payer: Self-pay | Admitting: Internal Medicine

## 2015-03-19 ENCOUNTER — Ambulatory Visit (INDEPENDENT_AMBULATORY_CARE_PROVIDER_SITE_OTHER): Payer: Commercial Managed Care - HMO | Admitting: Internal Medicine

## 2015-03-19 VITALS — BP 120/78 | HR 75 | Temp 98.2°F | Resp 20 | Wt 305.0 lb

## 2015-03-19 DIAGNOSIS — Z0000 Encounter for general adult medical examination without abnormal findings: Secondary | ICD-10-CM

## 2015-03-19 DIAGNOSIS — Z0189 Encounter for other specified special examinations: Secondary | ICD-10-CM

## 2015-03-19 DIAGNOSIS — I10 Essential (primary) hypertension: Secondary | ICD-10-CM | POA: Diagnosis not present

## 2015-03-19 DIAGNOSIS — J069 Acute upper respiratory infection, unspecified: Secondary | ICD-10-CM | POA: Diagnosis not present

## 2015-03-19 MED ORDER — LEVOFLOXACIN 250 MG PO TABS: 250.0000 mg | ORAL_TABLET | Freq: Every day | ORAL | Status: DC

## 2015-03-19 NOTE — Progress Notes (Signed)
Pre visit review using our clinic review tool, if applicable. No additional management support is needed unless otherwise documented below in the visit note. 

## 2015-03-19 NOTE — Progress Notes (Signed)
Subjective:    Patient ID: [name], male    DOB: 09/29/1950, 65 y.o.   MRN: [id number]  HPI   Here with 2-3 days acute onset fever, facial pain, pressure, headache, general weakness and malaise, and greenish d/c, with mild ST and cough, but pt denies chest pain, wheezing, increased sob or doe, orthopnea, PND, increased LE swelling, palpitations, dizziness or syncope.   Pt denies polydipsia, polyuria,  Has been working on wt loss - now down 5 lbs, just cant seem to get any lower.   Wt Readings from Last 3 Encounters:  03/19/15 305 lb (138.347 kg)  12/19/14 310 lb (140.615 kg)  11/01/14 309 lb (140.161 kg)   Past Medical History  Diagnosis Date  . Hypertension   . Hyperlipemia   . Obstructive sleep apnea July 2013    Not wearing CPAP. Split-Night sleep study 06/03/11 AHI = 27.43/hr (TOTAL SLEEP TIME 105 minutes) and REM AHI = 120.00/hr  . Morbid obesity with BMI of 40.0-44.9, adult ([place])   . PAH (pulmonary artery hypertension) ([place])   . Pulmonary embolism ([place]) 2008    DVT --> Massive PE with right-sided strain. On coumadin. ECHO 02/28/10 showed normal LV size, moderate concentricLVH, normal EF, normal atrial sizes, RV was normal but previously dilated.  . Chronic anticoagulation, PE DVT PAF   . Morbid obesity ([place])   . HTN (hypertension)   . Dyslipidemia   . PAF (paroxysmal atrial fibrillation)/atrial flutter 02/28/2010    ECHO : normal LV size, moderate concentricLVH, normal EF, normal atrial sizes, RV was normal but previously dilated;;  Myoview : Normal perfusion study. Low risk scan.  . Prolonged QT interval   . Edema of left lower extremity     S/P I&D 08/30/06  . GI bleed 04/2011    With anemia. EGD & colonoscopy 05/11/2011  . Anemia     From GI bleed in 05/11/11.   . Iliopsoas muscle hematoma 04/2011  . AAA (abdominal aortic aneurysm) [facility]) July 2013    Abdominal Aortic Endovascular Stent Graft 08/11/11. Bilateral. Right is 3.2 & the left is 3.6. "Preclose" repair bilateral  common femoral artery. Placement of catheter in aorta x 2. Repair of aorta w/modular bifurcated prosthesis w/bilateral limbs.  Placement of right limb extension: 23 mm x 12 cm. Placement of left limb extension: 14 mm x 7 cm.  . Iliac artery aneurysm, bilateral ([facility]CC)     PVD, bilat common iliac aneurysms [443.9]  . Lung nodule 2008  . Depression   . Chronic pain   . Osteoarthritis of both knees     Bilateral Arthroscopy in 2000 & 2008. Both were complicated w/venous thromboembolism S/P IVC filter placement due to one being massive PE in 2008.  [name] Psoriasis   . History of alcohol dependence ([place])    Past Surgical History  Procedure Laterality Date  . Knee surgery  1999  . Esophagogastroduodenoscopy  05/08/2011    For GI bleed/Anemia. Also had a colonoscopy at this time.  . Colonoscopy  05/11/2011    For GI bleed. Performed by Dr. Juanita Craver, MD. Also had a EGD performed at this time.  . Colonoscopy  05/12/2011  . Achilles tendon surgery    . Abdominal aortic evar  08/11/11    Bilateral. Right is 3.2 & the left is 3.6. "Preclose" repair bilateral common femoral artery. Placement of catheter in aorta x 2. Repair of aorta w/modular bifurcated prosthesis w/bilateral limbs.  Placement of right limb extension: 23 mm x  12 cm. Placement of left limb extension: 14 mm x 7 cm.  . Knee arthroscopy Bilateral 2000 & 2008    DJD knees. Both were complicated w/venous thromboembolism S/P IVC filter placement due to one being massive PE in 2008.  . Abdominal aortagram N/A 05/14/2011    Procedure: ABDOMINAL [name];  Surgeon: Conrad [place], MD;  Location: [facility] CATH LAB;  Service: Cardiovascular;  Laterality: N/A;  . Lower extremity angiogram N/A 05/14/2011    Procedure: LOWER EXTREMITY ANGIOGRAM;  Surgeon: Conrad [place], MD;  Location: [facility] CATH LAB;  Service: Cardiovascular;  Laterality: N/A;  . Insertion of vena cava filter N/A 05/14/2011    Procedure: INSERTION OF VENA CAVA FILTER;  Surgeon: Conrad Hall[place], MD;   Location: [facility] CATH LAB;  Service: Cardiovascular;  Laterality: N/A;  . Embolization N/A 07/30/2011    Procedure: EMBOLIZATION;  Surgeon: Conrad [place], MD;  Location: [facility] CATH LAB;  Service: Cardiovascular;  Laterality: N/A;    reports that he quit smoking about 8 years ago. He has never used smokeless tobacco. He reports that he drinks about 0.6 - 1.2 oz of alcohol per week. He reports that he uses illicit drugs. family history includes Arthritis in his father and mother; CAD (age of onset: 42) in his father; Cancer in his brother and mother. There is no history of Fainting. Allergies  Allergen Reactions  . Crestor [Rosuvastatin] Swelling   Current Outpatient Prescriptions on File Prior to Visit  Medication Sig Dispense Refill  . allopurinol (ZYLOPRIM) 100 MG tablet TAKE 1 TABLET EVERY DAY 90 tablet 3  . clotrimazole (LOTRIMIN) 1 % cream Apply topically 2 (two) times daily. 30 g 1  . desonide (DESOWEN) 0.05 % cream Apply 1 application topically daily. Apply to rash on face    . digoxin (LANOXIN) 0.125 MG tablet TAKE ONE TABLET BY MOUTH ONCE DAILY (HOLD  IF  PULSE  LESS  THAN  60) 30 tablet 0  . fluocinonide ointment (LIDEX) [id number] % Apply 1 application topically 2 (two) times daily. Apply to dry skin areas on body    . fluocinonide-emollient (LIDEX-E) 0.05 % cream Apply 1 application topically 2 (two) times daily. 454 g 0  . ketoconazole (NIZORAL) 200 MG tablet Take 1 tablet (200 mg total) by mouth daily. 7 tablet 0  . metoprolol (LOPRESSOR) 50 MG tablet TAKE 1 TABLET TWICE DAILY 180 tablet 3  . ondansetron (ZOFRAN-ODT) 8 MG disintegrating tablet Take 8 mg by mouth every 8 (eight) hours as needed.     [name] oxyCODONE-acetaminophen (PERCOCET) 10-325 MG per tablet Take 1 tablet by mouth every 6 (six) hours as needed. Pain. 30 tablet 0  . pravastatin (PRAVACHOL) 40 MG tablet TAKE 1 TABLET EVERY DAY 90 tablet 3  . sildenafil (VIAGRA) 100 MG tablet Take 0.5-1 tablets (50-100 mg total) by mouth daily as  needed for erectile dysfunction. 5 tablet 11  . TAZTIA XT 360 MG 24 hr capsule TAKE 1 CAPSULE EVERY DAY 90 capsule 3  . triamcinolone ointment (KENALOG) 0.1 % Apply 1 application topically daily. Apply to skin rash areas on body 454 g 0  . warfarin (COUMADIN) 5 MG tablet TAKE AS DIRECTED BY COUMADIN CLINIC 165 tablet 1  . zolpidem (AMBIEN) 5 MG tablet Take 5 mg by mouth at bedtime as needed.      No current facility-administered medications on file prior to visit.   Review of Systems All otherwise neg per pt     Objective:   Physical Exam  BP 120/78 mmHg  Pulse 75  Temp(Src) 98.2 F (36.8 C) (Oral)  Resp 20  Wt 305 lb (138.347 kg)  SpO2 97% VS noted, mild ill Constitutional: Pt appears in no significant distress HENT: Head: NCAT.  Right Ear: External ear normal.  Left Ear: External ear normal.  Bilat tm's with mild erythema.  Max sinus areas non tender.  Pharynx with mild erythema, no exudate Eyes: . Pupils are equal, round, and reactive to light. Conjunctivae and EOM are normal Neck: Normal range of motion. Neck supple.  Cardiovascular: Normal rate and regular rhythm.   Pulmonary/Chest: Effort normal and breath sounds without rales or wheezing.  Neurological: Pt is alert. Not confused , motor grossly intact Skin: Skin is warm. No rash, no LE edema Psychiatric: Pt behavior is normal. No agitation.     Assessment & Plan:

## 2015-03-19 NOTE — Patient Instructions (Signed)
Please take all new medication as prescribed - the antibiotic  Please continue all other medications as before, and refills have been done if requested.  Please have the pharmacy call with any other refills you may need.  Please keep your appointments with your specialists as you may have planned  Please return in 3 months, or sooner if needed, with Lab testing done 3-5 days before

## 2015-03-24 NOTE — Assessment & Plan Note (Signed)
Mild to mod, for antibx course,  to f/u any worsening symptoms or concerns 

## 2015-03-24 NOTE — Assessment & Plan Note (Signed)
stable overall by history and exam, recent data reviewed with pt, and pt to continue medical treatment as before,  to f/u any worsening symptoms or concerns BP Readings from Last 3 Encounters:  03/19/15 120/78  12/19/14 126/80  11/01/14 102/78

## 2015-04-10 ENCOUNTER — Ambulatory Visit: Payer: Commercial Managed Care - HMO

## 2015-04-24 ENCOUNTER — Ambulatory Visit (INDEPENDENT_AMBULATORY_CARE_PROVIDER_SITE_OTHER): Payer: Commercial Managed Care - HMO | Admitting: Internal Medicine

## 2015-04-24 ENCOUNTER — Encounter: Payer: Self-pay | Admitting: Internal Medicine

## 2015-04-24 ENCOUNTER — Other Ambulatory Visit (INDEPENDENT_AMBULATORY_CARE_PROVIDER_SITE_OTHER): Payer: Commercial Managed Care - HMO

## 2015-04-24 VITALS — BP 140/76 | HR 67 | Temp 98.3°F | Resp 20 | Wt 303.0 lb

## 2015-04-24 DIAGNOSIS — I10 Essential (primary) hypertension: Secondary | ICD-10-CM

## 2015-04-24 DIAGNOSIS — E785 Hyperlipidemia, unspecified: Secondary | ICD-10-CM | POA: Diagnosis not present

## 2015-04-24 DIAGNOSIS — Z1159 Encounter for screening for other viral diseases: Secondary | ICD-10-CM

## 2015-04-24 DIAGNOSIS — R6889 Other general symptoms and signs: Secondary | ICD-10-CM | POA: Diagnosis not present

## 2015-04-24 DIAGNOSIS — Z0000 Encounter for general adult medical examination without abnormal findings: Secondary | ICD-10-CM

## 2015-04-24 DIAGNOSIS — Z0001 Encounter for general adult medical examination with abnormal findings: Secondary | ICD-10-CM

## 2015-04-24 DIAGNOSIS — F329 Major depressive disorder, single episode, unspecified: Secondary | ICD-10-CM

## 2015-04-24 DIAGNOSIS — F32A Depression, unspecified: Secondary | ICD-10-CM

## 2015-04-24 LAB — URINALYSIS, ROUTINE W REFLEX MICROSCOPIC
HGB URINE DIPSTICK: NEGATIVE
Ketones, ur: NEGATIVE
LEUKOCYTES UA: NEGATIVE
NITRITE: NEGATIVE
PH: 5 (ref 5.0–8.0)
Specific Gravity, Urine: >=1.03 — AB (ref 1.000–1.030)
URINE GLUCOSE: NEGATIVE
Urobilinogen, UA: 1 (ref 0.0–1.0)

## 2015-04-24 LAB — CBC WITH DIFFERENTIAL/PLATELET
Basophils Absolute: 0 10*3/uL (ref 0.0–0.1)
Basophils Relative: 0.5 % (ref 0.0–3.0)
EOS PCT: 2.5 % (ref 0.0–5.0)
Eosinophils Absolute: 0.2 10*3/uL (ref 0.0–0.7)
HCT: 47.8 % (ref 39.0–52.0)
HEMOGLOBIN: 16 g/dL (ref 13.0–17.0)
LYMPHS PCT: 38.9 % (ref 12.0–46.0)
Lymphs Abs: 2.6 10*3/uL (ref 0.7–4.0)
MCHC: 33.4 g/dL (ref 30.0–36.0)
MCV: 96.2 fl (ref 78.0–100.0)
MONOS PCT: 13.9 % — AB (ref 3.0–12.0)
Monocytes Absolute: 0.9 10*3/uL (ref 0.1–1.0)
Neutro Abs: 3 10*3/uL (ref 1.4–7.7)
Neutrophils Relative %: 44.2 % (ref 43.0–77.0)
Platelets: 180 10*3/uL (ref 150.0–400.0)
RBC: 4.97 Mil/uL (ref 4.22–5.81)
RDW: 15.7 % — ABNORMAL HIGH (ref 11.5–15.5)
WBC: 6.8 10*3/uL (ref 4.0–10.5)

## 2015-04-24 NOTE — Patient Instructions (Addendum)
You had the Prevnar 13 pneumonia shot today  Please continue all other medications as before, and refills have been done if requested.  Please have the pharmacy call with any other refills you may need.  Please continue your efforts at being more active, low cholesterol diet, and weight control.  You are otherwise up to date with prevention measures today.  Please keep your appointments with your specialists as you may have planned  Please go to the LAB in the Basement (turn left off the elevator) for the tests to be done today  You will be contacted by phone if any changes need to be made immediately.  Otherwise, you will receive a letter about your results with an explanation, but please check with MyChart first.  Please remember to sign up for MyChart if you have not done so, as this will be important to you in the future with finding out test results, communicating by private email, and scheduling acute appointments online when needed.  Please return in 6 months, or sooner if needed, with Lab testing done 3-5 days before  OK to cancel your appt for Apr 27

## 2015-04-24 NOTE — Progress Notes (Signed)
Pre visit review using our clinic review tool, if applicable. No additional management support is needed unless otherwise documented below in the visit note. 

## 2015-04-24 NOTE — Progress Notes (Signed)
Subjective:    Patient ID: [name], male    DOB: 12-03-1950, 65 y.o.   MRN: [id number]  HPI  Here for wellness and f/u;  Overall doing ok;  Pt denies Chest pain, worsening SOB, DOE, wheezing, orthopnea, PND, worsening LE edema, palpitations, dizziness or syncope.  Pt denies neurological change such as new headache, facial or extremity weakness.  Pt denies polydipsia, polyuria, or low sugar symptoms. Pt states overall good compliance with treatment and medications, good tolerability, and has been trying to follow appropriate diet.  Pt denies worsening depressive symptoms, suicidal ideation or panic. No fever, night sweats, wt loss, loss of appetite, or other constitutional symptoms.  Pt states good ability with ADL's, has low fall risk, home safety reviewed and adequate, no other significant changes in hearing or vision, and only occasionally active with exercise. Due for Hep c, prevnar 13. Did have some left ear discomfort yesterday, but now resolved. Past Medical History  Diagnosis Date  . Hypertension   . Hyperlipemia   . Obstructive sleep apnea July 2013    Not wearing CPAP. Split-Night sleep study 06/03/11 AHI = 27.43/hr (TOTAL SLEEP TIME 105 minutes) and REM AHI = 120.00/hr  . Morbid obesity with BMI of 40.0-44.9, adult ([facility])   . PAH (pulmonary artery hypertension) ([place])   . Pulmonary embolism ([place]) 2008    DVT --> Massive PE with right-sided strain. On coumadin. ECHO 02/28/10 showed normal LV size, moderate concentricLVH, normal EF, normal atrial sizes, RV was normal but previously dilated.  . Chronic anticoagulation, PE DVT PAF   . Morbid obesity ([place])   . HTN (hypertension)   . Dyslipidemia   . PAF (paroxysmal atrial fibrillation)/atrial flutter 02/28/2010    ECHO : normal LV size, moderate concentricLVH, normal EF, normal atrial sizes, RV was normal but previously dilated;;  Myoview : Normal perfusion study. Low risk scan.  . Prolonged QT interval   . Edema of left lower  extremity     S/P I&D 08/30/06  . GI bleed 04/2011    With anemia. EGD & colonoscopy 05/11/2011  . Anemia     From GI bleed in 05/11/11.   . Iliopsoas muscle hematoma 04/2011  . AAA (abdominal aortic aneurysm) [facility]) July 2013    Abdominal Aortic Endovascular Stent Graft 08/11/11. Bilateral. Right is 3.2 & the left is 3.6. "Preclose" repair bilateral common femoral artery. Placement of catheter in aorta x 2. Repair of aorta w/modular bifurcated prosthesis w/bilateral limbs.  Placement of right limb extension: 23 mm x 12 cm. Placement of left limb extension: 14 mm x 7 cm.  . Iliac artery aneurysm, bilateral ([facility])     PVD, bilat common iliac aneurysms [443.9]  . Lung nodule 2008  . Depression   . Chronic pain   . Osteoarthritis of both knees     Bilateral Arthroscopy in 2000 & 2008. Both were complicated w/venous thromboembolism S/P IVC filter placement due to one being massive PE in 2008.  [name] Psoriasis   . History of alcohol dependence ([place])    Past Surgical History  Procedure Laterality Date  . Knee surgery  1999  . Esophagogastroduodenoscopy  05/08/2011    For GI bleed/Anemia. Also had a colonoscopy at this time.  . Colonoscopy  05/11/2011    For GI bleed. Performed by Dr. Juanita Craver, MD. Also had a EGD performed at this time.  . Colonoscopy  05/12/2011  . Achilles tendon surgery    . Abdominal aortic evar  08/11/11  Bilateral. Right is 3.2 & the left is 3.6. "Preclose" repair bilateral common femoral artery. Placement of catheter in aorta x 2. Repair of aorta w/modular bifurcated prosthesis w/bilateral limbs.  Placement of right limb extension: 23 mm x 12 cm. Placement of left limb extension: 14 mm x 7 cm.  . Knee arthroscopy Bilateral 2000 & 2008    DJD knees. Both were complicated w/venous thromboembolism S/P IVC filter placement due to one being massive PE in 2008.  . Abdominal aortagram N/A 05/14/2011    Procedure: ABDOMINAL [name];  Surgeon: Conrad Brooklawn, MD;  Location: [facility] CATH LAB;   Service: Cardiovascular;  Laterality: N/A;  . Lower extremity angiogram N/A 05/14/2011    Procedure: LOWER EXTREMITY ANGIOGRAM;  Surgeon: Conrad Kadoka, MD;  Location: [facility] CATH LAB;  Service: Cardiovascular;  Laterality: N/A;  . Insertion of vena cava filter N/A 05/14/2011    Procedure: INSERTION OF VENA CAVA FILTER;  Surgeon: Conrad Brazos, MD;  Location: [facility] CATH LAB;  Service: Cardiovascular;  Laterality: N/A;  . Embolization N/A 07/30/2011    Procedure: EMBOLIZATION;  Surgeon: Conrad Mesita, MD;  Location: [facility] CATH LAB;  Service: Cardiovascular;  Laterality: N/A;    reports that he quit smoking about 8 years ago. He has never used smokeless tobacco. He reports that he drinks about 0.6 - 1.2 oz of alcohol per week. He reports that he uses illicit drugs. family history includes Arthritis in his father and mother; CAD (age of onset: 73) in his father; Cancer in his brother and mother. There is no history of Fainting. Allergies  Allergen Reactions  . Crestor [Rosuvastatin] Swelling   Current Outpatient Prescriptions on File Prior to Visit  Medication Sig Dispense Refill  . allopurinol (ZYLOPRIM) 100 MG tablet TAKE 1 TABLET EVERY DAY 90 tablet 3  . clotrimazole (LOTRIMIN) 1 % cream Apply topically 2 (two) times daily. 30 g 1  . desonide (DESOWEN) 0.05 % cream Apply 1 application topically daily. Apply to rash on face    . digoxin (LANOXIN) 0.125 MG tablet TAKE ONE TABLET BY MOUTH ONCE DAILY (HOLD  IF  PULSE  LESS  THAN  60) 30 tablet 0  . fluocinonide ointment (LIDEX) AB-123456789 % Apply 1 application topically 2 (two) times daily. Apply to dry skin areas on body    . fluocinonide-emollient (LIDEX-E) 0.05 % cream Apply 1 application topically 2 (two) times daily. 454 g 0  . ketoconazole (NIZORAL) 200 MG tablet Take 1 tablet (200 mg total) by mouth daily. 7 tablet 0  . levofloxacin (LEVAQUIN) 250 MG tablet Take 1 tablet (250 mg total) by mouth daily. 10 tablet 0  . metoprolol (LOPRESSOR) 50 MG tablet TAKE 1  TABLET TWICE DAILY 180 tablet 3  . ondansetron (ZOFRAN-ODT) 8 MG disintegrating tablet Take 8 mg by mouth every 8 (eight) hours as needed.     [name] oxyCODONE-acetaminophen (PERCOCET) 10-325 MG per tablet Take 1 tablet by mouth every 6 (six) hours as needed. Pain. 30 tablet 0  . pravastatin (PRAVACHOL) 40 MG tablet TAKE 1 TABLET EVERY DAY 90 tablet 3  . sildenafil (VIAGRA) 100 MG tablet Take 0.5-1 tablets (50-100 mg total) by mouth daily as needed for erectile dysfunction. 5 tablet 11  . TAZTIA XT 360 MG 24 hr capsule TAKE 1 CAPSULE EVERY DAY 90 capsule 3  . triamcinolone ointment (KENALOG) 0.1 % Apply 1 application topically daily. Apply to skin rash areas on body 454 g 0  . warfarin (COUMADIN) 5 MG tablet  TAKE AS DIRECTED BY COUMADIN CLINIC 165 tablet 1  . zolpidem (AMBIEN) 5 MG tablet Take 5 mg by mouth at bedtime as needed.      No current facility-administered medications on file prior to visit.   Review of Systems Constitutional: Negative for increased diaphoresis, or other activity, appetite or siginficant weight change other than noted HENT: Negative for worsening hearing loss, ear pain, facial swelling, mouth sores and neck stiffness.   Eyes: Negative for other worsening pain, redness or visual disturbance.  Respiratory: Negative for choking or stridor Cardiovascular: Negative for other chest pain and palpitations.  Gastrointestinal: Negative for worsening diarrhea, blood in stool, or abdominal distention Genitourinary: Negative for hematuria, flank pain or change in urine volume.  Musculoskeletal: Negative for myalgias or other joint complaints.  Skin: Negative for other color change and wound or drainage.  Neurological: Negative for syncope and numbness. other than noted Hematological: Negative for adenopathy. or other swelling Psychiatric/Behavioral: Negative for hallucinations, SI, self-injury, decreased concentration or other worsening agitation.      Objective:   Physical  Exam BP 140/76 mmHg  Pulse 67  Temp(Src) 98.3 F (36.8 C) (Oral)  Resp 20  Wt 303 lb (137.44 kg)  SpO2 97% VS noted, morbid obese Constitutional: Pt is oriented to person, place, and time. Appears well-developed and well-nourished, in no significant distress Head: Normocephalic and atraumatic  Eyes: Conjunctivae and EOM are normal. Pupils are equal, round, and reactive to light Right Ear: External ear normal.  Left Ear: External ear normal Nose: Nose normal.  Mouth/Throat: Oropharynx is clear and moist  Neck: Normal range of motion. Neck supple. No JVD present. No tracheal deviation present or significant neck LA or mass Cardiovascular: Normal rate, regular rhythm, normal heart sounds and intact distal pulses.   Pulmonary/Chest: Effort normal and breath sounds without rales or wheezing  Abdominal: Soft. Bowel sounds are normal. NT. No HSM  Musculoskeletal: Normal range of motion. Exhibits no edema Lymphadenopathy: Has no cervical adenopathy.  Neurological: Pt is alert and oriented to person, place, and time. Pt has normal reflexes. No cranial nerve deficit. Motor grossly intact Skin: Skin is warm and dry. No rash noted or new ulcers Psychiatric:  Has mild nervous mood and affect. Behavior is normal. Not depressed affect      Assessment & Plan:

## 2015-04-25 ENCOUNTER — Encounter: Payer: Self-pay | Admitting: Internal Medicine

## 2015-04-25 LAB — LIPID PANEL
CHOLESTEROL: 174 mg/dL (ref 0–200)
HDL: 48.7 mg/dL (ref >39.00)
LDL Cholesterol: 95 mg/dL (ref 0–99)
NonHDL: 125.15
Total CHOL/HDL Ratio: 4
Triglycerides: 152 mg/dL — ABNORMAL HIGH (ref 0.0–149.0)
VLDL: 30.4 mg/dL (ref 0.0–40.0)

## 2015-04-25 LAB — HEPATIC FUNCTION PANEL
ALK PHOS: 88 U/L (ref 39–117)
ALT: 27 U/L (ref 0–53)
AST: 28 U/L (ref 0–37)
Albumin: 4.1 g/dL (ref 3.5–5.2)
BILIRUBIN DIRECT: 0.2 mg/dL (ref 0.0–0.3)
BILIRUBIN TOTAL: 0.8 mg/dL (ref 0.2–1.2)
TOTAL PROTEIN: 8.1 g/dL (ref 6.0–8.3)

## 2015-04-25 LAB — BASIC METABOLIC PANEL
BUN: 21 mg/dL (ref 6–23)
CO2: 30 meq/L (ref 19–32)
Calcium: 9.7 mg/dL (ref 8.4–10.5)
Chloride: 104 mEq/L (ref 96–112)
Creatinine, Ser: 1.11 mg/dL (ref 0.40–1.50)
GFR: 85.46 mL/min (ref >60.00)
GLUCOSE: 101 mg/dL — AB (ref 70–99)
POTASSIUM: 4.9 meq/L (ref 3.5–5.1)
SODIUM: 140 meq/L (ref 135–145)

## 2015-04-25 LAB — HEPATITIS C ANTIBODY: HCV AB: NEGATIVE

## 2015-04-25 LAB — TSH: TSH: 1.52 u[IU]/mL (ref 0.35–4.50)

## 2015-04-25 LAB — PSA: PSA: 0.61 ng/mL (ref 0.10–4.00)

## 2015-04-26 NOTE — Assessment & Plan Note (Signed)
stable overall by history and exam, recent data reviewed with pt, and pt to continue medical treatment as before,  to f/u any worsening symptoms or concerns Lab Results  Component Value Date   WBC 6.8 04/24/2015   HGB 16.0 04/24/2015   HCT 47.8 04/24/2015   PLT 180.0 04/24/2015   GLUCOSE 101* 04/24/2015   CHOL 174 04/24/2015   TRIG 152.0* 04/24/2015   HDL 48.70 04/24/2015   LDLCALC 95 04/24/2015   ALT 27 04/24/2015   AST 28 04/24/2015   NA 140 04/24/2015   K 4.9 04/24/2015   CL 104 04/24/2015   CREATININE 1.11 04/24/2015   BUN 21 04/24/2015   CO2 30 04/24/2015   TSH 1.52 04/24/2015   PSA 0.61 04/24/2015   INR 2.2 03/13/2015

## 2015-04-26 NOTE — Assessment & Plan Note (Signed)
stable overall by history and exam, recent data reviewed with pt, and pt to continue medical treatment as before,  to f/u any worsening symptoms or concerns Lab Results  Component Value Date   [place] 95 04/24/2015

## 2015-04-26 NOTE — Assessment & Plan Note (Addendum)
stable overall by history and exam, recent data reviewed with pt, and pt to continue medical treatment as before,  to f/u any worsening symptoms or concerns BP Readings from Last 3 Encounters:  04/24/15 140/76  03/19/15 120/78  12/19/14 126/80   .In addition to the time spent performing CPE, I spent an additional 15 minutes face to face,in which greater than 50% of this time was spent in counseling and coordination of care for patient's acute illness as documented.

## 2015-04-26 NOTE — Assessment & Plan Note (Signed)

## 2015-05-01 ENCOUNTER — Ambulatory Visit: Payer: Commercial Managed Care - HMO

## 2015-05-09 ENCOUNTER — Encounter: Payer: Commercial Managed Care - HMO | Admitting: Internal Medicine

## 2015-05-09 ENCOUNTER — Other Ambulatory Visit: Payer: Self-pay | Admitting: General Practice

## 2015-05-09 ENCOUNTER — Other Ambulatory Visit: Payer: Self-pay | Admitting: Internal Medicine

## 2015-05-09 MED ORDER — WARFARIN SODIUM 5 MG PO TABS: ORAL_TABLET | ORAL | Status: DC

## 2015-05-09 NOTE — Telephone Encounter (Signed)
Please advise 

## 2015-06-05 ENCOUNTER — Ambulatory Visit (INDEPENDENT_AMBULATORY_CARE_PROVIDER_SITE_OTHER): Payer: Commercial Managed Care - HMO | Admitting: General Practice

## 2015-06-05 DIAGNOSIS — Z86718 Personal history of other venous thrombosis and embolism: Secondary | ICD-10-CM | POA: Diagnosis not present

## 2015-06-05 DIAGNOSIS — Z5181 Encounter for therapeutic drug level monitoring: Secondary | ICD-10-CM | POA: Diagnosis not present

## 2015-06-05 LAB — POCT INR: INR: 2.1

## 2015-06-05 NOTE — Progress Notes (Signed)
Pre visit review using our clinic review tool, if applicable. No additional management support is needed unless otherwise documented below in the visit note. 

## 2015-06-05 NOTE — Progress Notes (Signed)
I have reviewed and agree with the plan. 

## 2015-07-08 ENCOUNTER — Other Ambulatory Visit: Payer: Self-pay | Admitting: Internal Medicine

## 2015-07-17 ENCOUNTER — Ambulatory Visit (INDEPENDENT_AMBULATORY_CARE_PROVIDER_SITE_OTHER): Payer: Commercial Managed Care - HMO | Admitting: General Practice

## 2015-07-17 DIAGNOSIS — Z86718 Personal history of other venous thrombosis and embolism: Secondary | ICD-10-CM

## 2015-07-17 DIAGNOSIS — Z5181 Encounter for therapeutic drug level monitoring: Secondary | ICD-10-CM

## 2015-07-17 DIAGNOSIS — I2699 Other pulmonary embolism without acute cor pulmonale: Secondary | ICD-10-CM

## 2015-07-17 LAB — POCT INR: INR: 2.2

## 2015-07-17 NOTE — Progress Notes (Signed)
Pre visit review using our clinic review tool, if applicable. No additional management support is needed unless otherwise documented below in the visit note. 

## 2015-08-28 ENCOUNTER — Ambulatory Visit: Payer: Commercial Managed Care - HMO

## 2015-10-02 ENCOUNTER — Ambulatory Visit (INDEPENDENT_AMBULATORY_CARE_PROVIDER_SITE_OTHER): Payer: Commercial Managed Care - HMO | Admitting: General Practice

## 2015-10-02 ENCOUNTER — Other Ambulatory Visit: Payer: Self-pay | Admitting: Internal Medicine

## 2015-10-02 DIAGNOSIS — Z86718 Personal history of other venous thrombosis and embolism: Secondary | ICD-10-CM | POA: Diagnosis not present

## 2015-10-02 DIAGNOSIS — Z5181 Encounter for therapeutic drug level monitoring: Secondary | ICD-10-CM

## 2015-10-02 DIAGNOSIS — L409 Psoriasis, unspecified: Secondary | ICD-10-CM

## 2015-10-02 DIAGNOSIS — Z23 Encounter for immunization: Secondary | ICD-10-CM

## 2015-10-02 LAB — POCT INR: INR: 2.1

## 2015-10-02 NOTE — Progress Notes (Signed)
I have reviewed and agree with the plan. 

## 2015-10-03 ENCOUNTER — Telehealth: Payer: Self-pay | Admitting: General Practice

## 2015-10-03 NOTE — Telephone Encounter (Signed)
-----   Message from Biagio Borg, MD sent at 10/02/2015  6:37 PM EDT ----- Regarding: RE: Referral This is doned, thanks ----- Message ----- From: Warden Fillers, RN Sent: 10/02/2015  10:32 AM To: Biagio Borg, MD, Octavio Manns Subject: Referral                                       Patient needs referral to Dr. Ouida Sills (Dermatology) for psoriasis.  Thanks, [employer]

## 2015-10-12 ENCOUNTER — Other Ambulatory Visit: Payer: Self-pay | Admitting: Internal Medicine

## 2015-10-14 ENCOUNTER — Other Ambulatory Visit: Payer: Self-pay | Admitting: General Practice

## 2015-10-14 MED ORDER — WARFARIN SODIUM 5 MG PO TABS: ORAL_TABLET | ORAL | 1 refills | Status: DC

## 2015-10-21 ENCOUNTER — Telehealth: Payer: Self-pay | Admitting: Internal Medicine

## 2015-10-21 NOTE — Telephone Encounter (Signed)
Rec'd from [facility] forward 1 page to [place]

## 2015-10-22 ENCOUNTER — Telehealth: Payer: Self-pay | Admitting: Internal Medicine

## 2015-10-22 NOTE — Telephone Encounter (Signed)
Rec'd from [facility] forward 4 pages to [place]

## 2015-10-28 ENCOUNTER — Encounter: Payer: Self-pay | Admitting: Family

## 2015-11-01 ENCOUNTER — Ambulatory Visit (INDEPENDENT_AMBULATORY_CARE_PROVIDER_SITE_OTHER): Payer: Commercial Managed Care - HMO | Admitting: Family

## 2015-11-01 ENCOUNTER — Encounter: Payer: Self-pay | Admitting: Family

## 2015-11-01 ENCOUNTER — Ambulatory Visit (HOSPITAL_COMMUNITY)
Admission: RE | Admit: 2015-11-01 | Discharge: 2015-11-01 | Disposition: A | Discharge status: Home / Self Care | Payer: Commercial Managed Care - HMO | Source: Ambulatory Visit | Attending: Family | Admitting: Family

## 2015-11-01 VITALS — BP 114/69 | HR 63 | Temp 97.3°F | Resp 18 | Ht 73.5 in | Wt 301.6 lb

## 2015-11-01 DIAGNOSIS — Z48812 Encounter for surgical aftercare following surgery on the circulatory system: Secondary | ICD-10-CM

## 2015-11-01 DIAGNOSIS — Z95828 Presence of other vascular implants and grafts: Secondary | ICD-10-CM | POA: Diagnosis present

## 2015-11-01 DIAGNOSIS — I723 Aneurysm of iliac artery: Secondary | ICD-10-CM

## 2015-11-01 NOTE — Patient Instructions (Signed)
Vitamin K Foods and Warfarin Warfarin is a medicine that helps prevent harmful blood clots by causing blood to clot more slowly. It does this by decreasing the activity of vitamin K, which promotes normal blood clotting. For the dose of warfarin you have been prescribed to work well, you need to get about the same amount of vitamin K from your food from day to day. Suddenly getting a lot more vitamin K could cause your blood to clot too quickly. A sudden decrease in vitamin K intake could cause your blood to clot too slowly. These changes in vitamin K intake could lead to dangerous blood clotsor to bleeding. WHAT GENERAL GUIDELINES DO I NEED TO FOLLOW?  Keep your intake of vitamin K consistent from day to day. To do this, you must be aware of which foods contain moderate or high amounts of vitamin K. Listed below are some foods that are very high, high, or moderately high in vitamin K. If you eat these foods, make sure you eat a consistent amount of them from day to day.  Avoid major changes in your diet, or tell your health care provider before changing your diet.  If you take a multivitamin that contains vitamin K, be sure to take it every day.  If you drink green tea, drink the same amount each day. WHAT FOODS ARE VERY HIGH IN VITAMIN K?   Greens, such as Swiss chard and beet, collard, mustard, or turnip greens (fresh or frozen, cooked).  Kale (fresh or frozen, cooked).   Parsley (raw).  Spinach (cooked).  WHAT FOODS ARE HIGH IN VITAMIN K?  Asparagus (frozen, cooked).  Broccoli.   Bok choy (cooked).   Brussels sprouts (fresh or frozen, cooked).  Cabbage (cooked).  Coleslaw. WHAT FOODS ARE MODERATELY HIGH IN VITAMIN K?  Blueberries.  Black-eyed peas.  Endive (raw).   Green leaf lettuce (raw).   Green scallions (raw).  Kale (raw).  Okra (frozen, cooked).  Plantains (fried).  Romaine lettuce (raw).   Sauerkraut (canned).   Spinach (raw).   This  information is not intended to replace advice given to you by your health care provider. Make sure you discuss any questions you have with your health care provider.   Document Released: 10/26/2008 Document Revised: 01/19/2014 Document Reviewed: 11/02/2012 Elsevier Interactive Patient Education [employer].

## 2015-11-01 NOTE — Progress Notes (Signed)
[facility] [place]  CC: Follow up s/p bilateral CIA aneurysm stent graft repair  History of Present Illness  [name] is a 65 y.o. (12-07-1950) male patient who presents for routine follow up s/p EVAR (Date: 08/11/11) and L IIA embolization (Date: 07/30/11) by Dr. Bridgett Larsson for B CIA aneurysms.  EVAR duplex (Date: 10/20/13) demonstrates: no endoleak and shrinking iliac aneurysm sac sizes. Most recent CT (Date: 10/14/2012) demonstrates: no endoleak and decreasing iliac sac sizes to limb size.  The patient denies any buttock, thighs, or calves claudication, denies non healing wounds.  The patient denies back or abdominal pain. The patient is a former smoker. The patient denies history of stroke or TIA symptoms. He is trying to lose weight, is exercising 6 days/week.  Pt Diabetic: No Pt smoker: former smoker, quit in 2008  He takes coumadin for atrial fib, prescribed by his cardiologist.    Past Medical History:  Diagnosis Date  . AAA (abdominal aortic aneurysm) [facility]) July 2013   Abdominal Aortic Endovascular Stent Graft 08/11/11. Bilateral. Right is 3.2 & the left is 3.6. "Preclose" repair bilateral common femoral artery. Placement of catheter in aorta x 2. Repair of aorta w/modular bifurcated prosthesis w/bilateral limbs.  Placement of right limb extension: 23 mm x 12 cm. Placement of left limb extension: 14 mm x 7 cm.  . Anemia    From GI bleed in 05/11/11.   [name] Chronic anticoagulation, PE DVT PAF   . Chronic pain   . Depression   . Dyslipidemia   . Edema of left lower extremity    S/P I&D 08/30/06  . GI bleed 04/2011   With anemia. EGD & colonoscopy 05/11/2011  . History of alcohol dependence ([place])   . HTN (hypertension)   . Hyperlipemia   . Hypertension   . Iliac artery aneurysm, bilateral ([facility])    PVD, bilat common iliac aneurysms [443.9]  . Iliopsoas muscle hematoma 04/2011  . Lung nodule 2008  . Morbid obesity ([place])   . Morbid obesity with BMI of  40.0-44.9, adult ([place])   . Obstructive sleep apnea July 2013   Not wearing CPAP. Split-Night sleep study 06/03/11 AHI = 27.43/hr (TOTAL SLEEP TIME 105 minutes) and REM AHI = 120.00/hr  . Osteoarthritis of both knees    Bilateral Arthroscopy in 2000 & 2008. Both were complicated w/venous thromboembolism S/P IVC filter placement due to one being massive PE in 2008.  [name] PAF (paroxysmal atrial fibrillation)/atrial flutter 02/28/2010   ECHO : normal LV size, moderate concentricLVH, normal EF, normal atrial sizes, RV was normal but previously dilated;;  Myoview : Normal perfusion study. Low risk scan.  [name] PAH (pulmonary artery hypertension)   . Prolonged QT interval   . Psoriasis   . Pulmonary embolism ([place]) 2008   DVT --> Massive PE with right-sided strain. On coumadin. ECHO 02/28/10 showed normal LV size, moderate concentricLVH, normal EF, normal atrial sizes, RV was normal but previously dilated.   Past Surgical History:  Procedure Laterality Date  . ABDOMINAL AORTAGRAM N/A 05/14/2011   Procedure: ABDOMINAL [name];  Surgeon: Conrad [place], MD;  Location: [facility] CATH LAB;  Service: Cardiovascular;  Laterality: N/A;  . Abdominal Aortic Evar  08/11/11   Bilateral. Right is 3.2 & the left is 3.6. "Preclose" repair bilateral common femoral artery. Placement of catheter in aorta x 2. Repair of aorta w/modular bifurcated prosthesis w/bilateral limbs.  Placement of right limb extension: 23 mm x 12 cm. Placement of left limb extension:  14 mm x 7 cm.  . ACHILLES TENDON SURGERY    . COLONOSCOPY  05/11/2011   For GI bleed. Performed by Dr. Juanita Craver, MD. Also had a EGD performed at this time.  . COLONOSCOPY  05/12/2011  . EMBOLIZATION N/A 07/30/2011   Procedure: EMBOLIZATION;  Surgeon: Conrad Roseland, MD;  Location: [facility] CATH LAB;  Service: Cardiovascular;  Laterality: N/A;  . ESOPHAGOGASTRODUODENOSCOPY  05/08/2011   For GI bleed/Anemia. Also had a colonoscopy at this time.  . INSERTION OF VENA CAVA FILTER N/A 05/14/2011    Procedure: INSERTION OF VENA CAVA FILTER;  Surgeon: Conrad Grayslake, MD;  Location: [facility] CATH LAB;  Service: Cardiovascular;  Laterality: N/A;  . KNEE ARTHROSCOPY Bilateral 2000 & 2008   DJD knees. Both were complicated w/venous thromboembolism S/P IVC filter placement due to one being massive PE in 2008.  [name] KNEE SURGERY  1999  . LOWER EXTREMITY ANGIOGRAM N/A 05/14/2011   Procedure: LOWER EXTREMITY ANGIOGRAM;  Surgeon: Conrad Downieville-Lawson-Dumont, MD;  Location: [facility] CATH LAB;  Service: Cardiovascular;  Laterality: N/A;   Social History Social History  Substance Use Topics  . Smoking status: Former Smoker    Packs/day: 0.50    Years: 2.00    Quit date: 07/05/2006  . Smokeless tobacco: Never Used  . Alcohol use 0.6 - 1.2 oz/week    1 - 2 Shots of liquor per week   Family History Family History  Problem Relation Age of Onset  . Cancer Mother     Pancreatic  . Arthritis Mother   . Arthritis Father   . CAD Father 35  . Cancer Brother     Colon  . Fainting Neg Hx    Current Outpatient Prescriptions on File Prior to Visit  Medication Sig Dispense Refill  . allopurinol (ZYLOPRIM) 100 MG tablet TAKE 1 TABLET EVERY DAY 90 tablet 3  . clotrimazole (LOTRIMIN) 1 % cream Apply topically 2 (two) times daily. 30 g 1  . desonide (DESOWEN) 0.05 % cream Apply 1 application topically daily. Apply to rash on face    . digoxin (LANOXIN) 0.125 MG tablet TAKE 1 TABLET EVERY DAY 90 tablet 3  . diltiazem (TIAZAC) 360 MG 24 hr capsule TAKE 1 CAPSULE EVERY DAY 90 capsule 3  . fluocinonide ointment (LIDEX) [id number] % Apply 1 application topically 2 (two) times daily. Apply to dry skin areas on body    . fluocinonide-emollient (LIDEX-E) 0.05 % cream Apply 1 application topically 2 (two) times daily. 454 g 0  . ketoconazole (NIZORAL) 200 MG tablet Take 1 tablet (200 mg total) by mouth daily. 7 tablet 0  . levofloxacin (LEVAQUIN) 250 MG tablet Take 1 tablet (250 mg total) by mouth daily. 10 tablet 0  . metoprolol (LOPRESSOR) 50 MG  tablet TAKE 1 TABLET TWICE DAILY 180 tablet 1  . ondansetron (ZOFRAN-ODT) 8 MG disintegrating tablet Take 8 mg by mouth every 8 (eight) hours as needed.     . pravastatin (PRAVACHOL) 40 MG tablet TAKE 1 TABLET EVERY DAY 90 tablet 3  . sildenafil (VIAGRA) 100 MG tablet Take 0.5-1 tablets (50-100 mg total) by mouth daily as needed for erectile dysfunction. 5 tablet 11  . triamcinolone ointment (KENALOG) 0.1 % Apply 1 application topically daily. Apply to skin rash areas on body 454 g 0  . warfarin (COUMADIN) 5 MG tablet TAKE AS DIRECTED BY COUMADIN CLINIC 120 tablet 1  . zolpidem (AMBIEN) 5 MG tablet Take 5 mg by mouth at bedtime as needed.     [name]  oxyCODONE-acetaminophen (PERCOCET) 10-325 MG per tablet Take 1 tablet by mouth every 6 (six) hours as needed. Pain. (Patient not taking: Reported on 11/01/2015) 30 tablet 0   No current facility-administered medications on file prior to visit.    Allergies  Allergen Reactions  . Crestor [Rosuvastatin] Swelling     ROS: See HPI for pertinent positives and negatives.  Physical Examination  Vitals:   11/01/15 0847  BP: 114/69  Pulse: 63  Resp: 18  Temp: 97.3 F (36.3 C)  TempSrc: Oral  SpO2: 99%  Weight: (!) 301 lb 9.6 oz (136.8 kg)  Height: 6' 1.5" (1.867 m)   Body mass index is 39.25 kg/m.  General: A&O x 3, WD, morbidly obese male  Eyes: PERRLA Pulmonary: Sym exp, good air movt, CTAB, no rales, rhonchi, & wheezing  Cardiac: RRR, Nl S1, S2, no murmur detected  Vascular: 2+ bilateral DP pulses, bilateral PT pulses are not palpable, right popliteal pulses is 2+ palpable, left popliteal pulse is not palpable, right femoral pulse is not palpable, left femoral pulse is 1+ palpable. Aorta is not palpable. Gastrointestinal: soft, NTND, -G/R, - HSM, - masses palpated, - CVAT B, obese, large panus. Musculoskeletal: M/S 5/5 throughout , Extremities without ischemic changes  Neurologic: Pain and light touch intact in extremities , Motor  exam as listed above  Non-Invasive Vascular Imaging  EVAR Duplex (Date: 11/01/15)  Aorta is not visualized  Right common iliac artery: 2.14 cm AP, transverse not visualized  Left common iliac artery: 1.53 cm AP, transverse not visualized  Technically limited and difficult exam due body habits and overlying bowel gas  The endo graft appears patent based on limited visualization.  Comparison not appropriate due to limited visualization of serial exams.  10/26/14: right CIA: 2.18 cm x 2.25 cm; Left CIA 1.9 cm x 1.93 cm  Medical Decision Making  [name] is a 65 y.o. male who presents s/p EVAR (Date 08/11/11) and L IIA embolization (Date: 07/30/11) by Dr. Bridgett Larsson for B CIA aneurysms.   Pt is asymptomatic with stable bilateral CIA diameters, based on limited visualization. Aorta is not visualized.    I discussed with the patient the importance of surveillance of the endograft.  The next CTA will be scheduled for 9 months; this will be his 5 year post operative CTA.  The patient will follow up with [place] in 9 months with these studies, with me, on a day that Dr. Bridgett Larsson os in the office.  I emphasized the importance of maximal medical management including strict control of blood pressure, blood glucose, and lipid levels, antiplatelet agents, obtaining regular exercise, and cessation of smoking.   Thank you for allowing [place] to participate in this patient's care.  Clemon Chambers, RN, MSN, FNP-C [facility] [place] Office: [phone]  Clinic Physician: Bridgett Larsson  11/01/2015, 8:56 AM

## 2015-11-13 ENCOUNTER — Ambulatory Visit (INDEPENDENT_AMBULATORY_CARE_PROVIDER_SITE_OTHER): Payer: Commercial Managed Care - HMO | Admitting: General Practice

## 2015-11-13 DIAGNOSIS — Z5181 Encounter for therapeutic drug level monitoring: Secondary | ICD-10-CM

## 2015-11-13 DIAGNOSIS — Z86718 Personal history of other venous thrombosis and embolism: Secondary | ICD-10-CM

## 2015-11-13 LAB — POCT INR: INR: 2.6

## 2015-11-13 NOTE — Patient Instructions (Signed)
Pre visit review using our clinic review tool, if applicable. No additional management support is needed unless otherwise documented below in the visit note. 

## 2015-11-13 NOTE — Progress Notes (Signed)
I have reviewed and agree with the plan. 

## 2015-12-03 NOTE — Addendum Note (Signed)
Addended by: Lianne Cure A on: 12/03/2015 01:07 PM   Modules accepted: Orders

## 2015-12-25 ENCOUNTER — Ambulatory Visit: Payer: Commercial Managed Care - HMO

## 2016-02-05 ENCOUNTER — Ambulatory Visit (INDEPENDENT_AMBULATORY_CARE_PROVIDER_SITE_OTHER): Payer: Medicare Other | Admitting: Internal Medicine

## 2016-02-05 ENCOUNTER — Ambulatory Visit (INDEPENDENT_AMBULATORY_CARE_PROVIDER_SITE_OTHER): Payer: Medicare Other | Admitting: General Practice

## 2016-02-05 ENCOUNTER — Other Ambulatory Visit (INDEPENDENT_AMBULATORY_CARE_PROVIDER_SITE_OTHER): Payer: Medicare Other

## 2016-02-05 ENCOUNTER — Encounter: Payer: Self-pay | Admitting: Internal Medicine

## 2016-02-05 VITALS — BP 128/82 | HR 80 | Resp 20 | Wt 306.0 lb

## 2016-02-05 DIAGNOSIS — Z5181 Encounter for therapeutic drug level monitoring: Secondary | ICD-10-CM

## 2016-02-05 DIAGNOSIS — Z0001 Encounter for general adult medical examination with abnormal findings: Secondary | ICD-10-CM

## 2016-02-05 DIAGNOSIS — R739 Hyperglycemia, unspecified: Secondary | ICD-10-CM

## 2016-02-05 DIAGNOSIS — Z86718 Personal history of other venous thrombosis and embolism: Secondary | ICD-10-CM

## 2016-02-05 DIAGNOSIS — Z23 Encounter for immunization: Secondary | ICD-10-CM

## 2016-02-05 DIAGNOSIS — L84 Corns and callosities: Secondary | ICD-10-CM | POA: Diagnosis not present

## 2016-02-05 LAB — LIPID PANEL
CHOL/HDL RATIO: 4
CHOLESTEROL: 162 mg/dL (ref 0–200)
HDL: 44.1 mg/dL (ref >39.00)
LDL CALC: 97 mg/dL (ref 0–99)
NonHDL: 117.57
TRIGLYCERIDES: 105 mg/dL (ref 0.0–149.0)
VLDL: 21 mg/dL (ref 0.0–40.0)

## 2016-02-05 LAB — BASIC METABOLIC PANEL
BUN: 23 mg/dL (ref 6–23)
CO2: 29 mEq/L (ref 19–32)
CREATININE: 1.04 mg/dL (ref 0.40–1.50)
Calcium: 9.4 mg/dL (ref 8.4–10.5)
Chloride: 107 mEq/L (ref 96–112)
GFR: 91.91 mL/min (ref >60.00)
GLUCOSE: 95 mg/dL (ref 70–99)
POTASSIUM: 4.1 meq/L (ref 3.5–5.1)
Sodium: 141 mEq/L (ref 135–145)

## 2016-02-05 LAB — CBC WITH DIFFERENTIAL/PLATELET
BASOS PCT: 0.4 % (ref 0.0–3.0)
Basophils Absolute: 0 10*3/uL (ref 0.0–0.1)
EOS ABS: 0.1 10*3/uL (ref 0.0–0.7)
EOS PCT: 2 % (ref 0.0–5.0)
HCT: 48.3 % (ref 39.0–52.0)
HEMOGLOBIN: 16.4 g/dL (ref 13.0–17.0)
LYMPHS PCT: 23.8 % (ref 12.0–46.0)
Lymphs Abs: 1.5 10*3/uL (ref 0.7–4.0)
MCHC: 33.9 g/dL (ref 30.0–36.0)
MCV: 96.5 fl (ref 78.0–100.0)
MONO ABS: 0.8 10*3/uL (ref 0.1–1.0)
Monocytes Relative: 12.4 % — ABNORMAL HIGH (ref 3.0–12.0)
Neutro Abs: 4 10*3/uL (ref 1.4–7.7)
Neutrophils Relative %: 61.4 % (ref 43.0–77.0)
Platelets: 172 10*3/uL (ref 150.0–400.0)
RBC: 5.01 Mil/uL (ref 4.22–5.81)
RDW: 14.8 % (ref 11.5–15.5)
WBC: 6.4 10*3/uL (ref 4.0–10.5)

## 2016-02-05 LAB — URINALYSIS, ROUTINE W REFLEX MICROSCOPIC
BILIRUBIN URINE: NEGATIVE
HGB URINE DIPSTICK: NEGATIVE
KETONES UR: NEGATIVE
LEUKOCYTES UA: NEGATIVE
NITRITE: NEGATIVE
RBC / HPF: NONE SEEN (ref 0–?)
Specific Gravity, Urine: 1.02 (ref 1.000–1.030)
TOTAL PROTEIN, URINE-UPE24: NEGATIVE
URINE GLUCOSE: NEGATIVE
Urobilinogen, UA: 1 (ref 0.0–1.0)
pH: 6 (ref 5.0–8.0)

## 2016-02-05 LAB — HEMOGLOBIN A1C: Hgb A1c MFr Bld: 5.8 % (ref 4.6–6.5)

## 2016-02-05 LAB — HEPATIC FUNCTION PANEL
ALBUMIN: 4.1 g/dL (ref 3.5–5.2)
ALT: 26 U/L (ref 0–53)
AST: 23 U/L (ref 0–37)
Alkaline Phosphatase: 96 U/L (ref 39–117)
BILIRUBIN TOTAL: 0.6 mg/dL (ref 0.2–1.2)
Bilirubin, Direct: 0.2 mg/dL (ref 0.0–0.3)
Total Protein: 7.9 g/dL (ref 6.0–8.3)

## 2016-02-05 LAB — PSA: PSA: 0.51 ng/mL (ref 0.10–4.00)

## 2016-02-05 LAB — POCT INR: INR: 2.5

## 2016-02-05 LAB — TSH: TSH: 1.64 u[IU]/mL (ref 0.35–4.50)

## 2016-02-05 NOTE — Patient Instructions (Signed)
Pre visit review using our clinic review tool, if applicable. No additional management support is needed unless otherwise documented below in the visit note. 

## 2016-02-05 NOTE — Progress Notes (Signed)
Pre visit review using our clinic review tool, if applicable. No additional management support is needed unless otherwise documented below in the visit note. 

## 2016-02-05 NOTE — Progress Notes (Signed)
Subjective:    Patient ID: [name], male    DOB: Dec 11, 1950, 66 y.o.   MRN: [id number]  HPI  ,Here for wellness and f/u;  Overall doing ok;  Pt denies Chest pain, worsening SOB, DOE, wheezing, orthopnea, PND, worsening LE edema, palpitations, dizziness or syncope.  Pt denies neurological change such as new headache, facial or extremity weakness.  Pt denies polydipsia, polyuria, or low sugar symptoms. Pt states overall good compliance with treatment and medications, good tolerability, and has been trying to follow appropriate diet.  Pt denies worsening depressive symptoms, suicidal ideation or panic. No fever, night sweats, wt loss, loss of appetite, or other constitutional symptoms.  Pt states good ability with ADL's, has low fall risk, home safety reviewed and adequate, no other significant changes in hearing or vision, and only occasionally active with exercise, mostly due to having mid right foot corn likelesion, hurts to walk, so much less active since thanksgiving.    Also with recent onset penile d/c after intercourse.  Past Medical History:  Diagnosis Date  . AAA (abdominal aortic aneurysm) [facility]) July 2013   Abdominal Aortic Endovascular Stent Graft 08/11/11. Bilateral. Right is 3.2 & the left is 3.6. "Preclose" repair bilateral common femoral artery. Placement of catheter in aorta x 2. Repair of aorta w/modular bifurcated prosthesis w/bilateral limbs.  Placement of right limb extension: 23 mm x 12 cm. Placement of left limb extension: 14 mm x 7 cm.  . Anemia    From GI bleed in 05/11/11.   [name] Chronic anticoagulation, PE DVT PAF   . Chronic pain   . Depression   . Dyslipidemia   . Edema of left lower extremity    S/P I&D 08/30/06  . GI bleed 04/2011   With anemia. EGD & colonoscopy 05/11/2011  . History of alcohol dependence ([place])   . HTN (hypertension)   . Hyperlipemia   . Hypertension   . Iliac artery aneurysm, bilateral (HCC)    PVD, bilat common iliac aneurysms [443.9]  .  Iliopsoas muscle hematoma 04/2011  . Lung nodule 2008  . Morbid obesity ([place])   . Morbid obesity with BMI of 40.0-44.9, adult ([place])   . Obstructive sleep apnea July 2013   Not wearing CPAP. Split-Night sleep study 06/03/11 AHI = 27.43/hr (TOTAL SLEEP TIME 105 minutes) and REM AHI = 120.00/hr  . Osteoarthritis of both knees    Bilateral Arthroscopy in 2000 & 2008. Both were complicated w/venous thromboembolism S/P IVC filter placement due to one being massive PE in 2008.  [name] PAF (paroxysmal atrial fibrillation)/atrial flutter 02/28/2010   ECHO : normal LV size, moderate concentricLVH, normal EF, normal atrial sizes, RV was normal but previously dilated;;  Myoview : Normal perfusion study. Low risk scan.  [name] PAH (pulmonary artery hypertension)   . Prolonged QT interval   . Psoriasis   . Pulmonary embolism ([place]) 2008   DVT --> Massive PE with right-sided strain. On coumadin. ECHO 02/28/10 showed normal LV size, moderate concentricLVH, normal EF, normal atrial sizes, RV was normal but previously dilated.   Past Surgical History:  Procedure Laterality Date  . ABDOMINAL AORTAGRAM N/A 05/14/2011   Procedure: ABDOMINAL [name];  Surgeon: Conrad [place], MD;  Location: [facility] CATH LAB;  Service: Cardiovascular;  Laterality: N/A;  . Abdominal Aortic Evar  08/11/11   Bilateral. Right is 3.2 & the left is 3.6. "Preclose" repair bilateral common femoral artery. Placement of catheter in aorta x 2. Repair of aorta w/modular bifurcated prosthesis  w/bilateral limbs.  Placement of right limb extension: 23 mm x 12 cm. Placement of left limb extension: 14 mm x 7 cm.  . ACHILLES TENDON SURGERY    . COLONOSCOPY  05/11/2011   For GI bleed. Performed by Dr. Juanita Craver, MD. Also had a EGD performed at this time.  . COLONOSCOPY  05/12/2011  . EMBOLIZATION N/A 07/30/2011   Procedure: EMBOLIZATION;  Surgeon: Conrad [place], MD;  Location: [facility] CATH LAB;  Service: Cardiovascular;  Laterality: N/A;  . ESOPHAGOGASTRODUODENOSCOPY   05/08/2011   For GI bleed/Anemia. Also had a colonoscopy at this time.  . INSERTION OF VENA CAVA FILTER N/A 05/14/2011   Procedure: INSERTION OF VENA CAVA FILTER;  Surgeon: Conrad [place], MD;  Location: [facility] CATH LAB;  Service: Cardiovascular;  Laterality: N/A;  . KNEE ARTHROSCOPY Bilateral 2000 & 2008   DJD knees. Both were complicated w/venous thromboembolism S/P IVC filter placement due to one being massive PE in 2008.  [name] KNEE SURGERY  1999  . LOWER EXTREMITY ANGIOGRAM N/A 05/14/2011   Procedure: LOWER EXTREMITY ANGIOGRAM;  Surgeon: Conrad [place], MD;  Location: [facility] CATH LAB;  Service: Cardiovascular;  Laterality: N/A;    reports that he quit smoking about 9 years ago. He has a 1.00 pack-year smoking history. He has never used smokeless tobacco. He reports that he drinks about 0.6 - 1.2 oz of alcohol per week . He reports that he uses drugs. family history includes Arthritis in his father and mother; CAD (age of onset: 17) in his father; Cancer in his brother and mother. Allergies  Allergen Reactions  . Crestor [Rosuvastatin] Swelling   Current Outpatient Prescriptions on File Prior to Visit  Medication Sig Dispense Refill  . allopurinol (ZYLOPRIM) 100 MG tablet TAKE 1 TABLET EVERY DAY 90 tablet 3  . clotrimazole (LOTRIMIN) 1 % cream Apply topically 2 (two) times daily. 30 g 1  . desonide (DESOWEN) 0.05 % cream Apply 1 application topically daily. Apply to rash on face    . digoxin (LANOXIN) 0.125 MG tablet TAKE 1 TABLET EVERY DAY 90 tablet 3  . diltiazem (TIAZAC) 360 MG 24 hr capsule TAKE 1 CAPSULE EVERY DAY 90 capsule 3  . fluocinonide ointment (LIDEX) [id number] % Apply 1 application topically 2 (two) times daily. Apply to dry skin areas on body    . fluocinonide-emollient (LIDEX-E) 0.05 % cream Apply 1 application topically 2 (two) times daily. 454 g 0  . ketoconazole (NIZORAL) 200 MG tablet Take 1 tablet (200 mg total) by mouth daily. 7 tablet 0  . metoprolol (LOPRESSOR) 50 MG tablet TAKE 1 TABLET  TWICE DAILY 180 tablet 1  . ondansetron (ZOFRAN-ODT) 8 MG disintegrating tablet Take 8 mg by mouth every 8 (eight) hours as needed.     [name] oxyCODONE-acetaminophen (PERCOCET) 10-325 MG per tablet Take 1 tablet by mouth every 6 (six) hours as needed. Pain. 30 tablet 0  . pravastatin (PRAVACHOL) 40 MG tablet TAKE 1 TABLET EVERY DAY 90 tablet 3  . sildenafil (VIAGRA) 100 MG tablet Take 0.5-1 tablets (50-100 mg total) by mouth daily as needed for erectile dysfunction. 5 tablet 11  . triamcinolone ointment (KENALOG) 0.1 % Apply 1 application topically daily. Apply to skin rash areas on body 454 g 0  . warfarin (COUMADIN) 5 MG tablet TAKE AS DIRECTED BY COUMADIN CLINIC 120 tablet 1  . zolpidem (AMBIEN) 5 MG tablet Take 5 mg by mouth at bedtime as needed.     [name] levofloxacin (LEVAQUIN) 250  MG tablet Take 1 tablet (250 mg total) by mouth daily. (Patient not taking: Reported on 02/05/2016) 10 tablet 0   No current facility-administered medications on file prior to visit.     Review of Systems Constitutional: Negative for increased diaphoresis, or other activity, appetite or siginficant weight change other than noted HENT: Negative for worsening hearing loss, ear pain, facial swelling, mouth sores and neck stiffness.   Eyes: Negative for other worsening pain, redness or visual disturbance.  Respiratory: Negative for choking or stridor Cardiovascular: Negative for other chest pain and palpitations.  Gastrointestinal: Negative for worsening diarrhea, blood in stool, or abdominal distention Genitourinary: Negative for hematuria, flank pain or change in urine volume.  Musculoskeletal: Negative for myalgias or other joint complaints.  Skin: Negative for other color change and wound or drainage.  Neurological: Negative for syncope and numbness. other than noted Hematological: Negative for adenopathy. or other swelling Psychiatric/Behavioral: Negative for hallucinations, SI, self-injury, decreased concentration  or other worsening agitation.  All other system neg per pt    Objective:   Physical Exam BP 128/82   Pulse 80   Resp 20   Wt (!) 306 lb (138.8 kg)   SpO2 97%   BMI 39.82 kg/m  VS noted,  Constitutional: Pt is oriented to person, place, and time. Appears well-developed and well-nourished, in no significant distress Head: Normocephalic and atraumatic  Eyes: Conjunctivae and EOM are normal. Pupils are equal, round, and reactive to light Right Ear: External ear normal.  Left Ear: External ear normal Nose: Nose normal.  Mouth/Throat: Oropharynx is clear and moist  Neck: Normal range of motion. Neck supple. No JVD present. No tracheal deviation present or significant neck LA or mass Cardiovascular: Normal rate, regular rhythm, normal heart sounds and intact distal pulses.   Pulmonary/Chest: Effort normal and breath sounds without rales or wheezing  Abdominal: Soft. Bowel sounds are normal. NT. No HSM  Musculoskeletal: Normal range of motion. Exhibits no edema Lymphadenopathy: Has no cervical adenopathy.  Neurological: Pt is alert and oriented to person, place, and time. Pt has normal reflexes. No cranial nerve deficit. Motor grossly intact Skin: Skin is warm and dry. No rash noted or new ulcers, has tender hard corn like lesion to plantar right foot about 3rd MTP, tender Psychiatric:  Has normal mood and affect. Behavior is normal.  No other new exam findings    Assessment & Plan:

## 2016-02-05 NOTE — Patient Instructions (Addendum)
You had the Prevnar pneumonia shot  You will be contacted regarding the referral for: podiatry for the right foot  Please continue all other medications as before, and refills have been done if requested.  Please have the pharmacy call with any other refills you may need.  Please continue your efforts at being more active, low cholesterol diet, and weight control.  You are otherwise up to date with prevention measures today.  Please keep your appointments with your specialists as you may have planned  Please go to the LAB in the Basement (turn left off the elevator) for the tests to be done today  You will be contacted by phone if any changes need to be made immediately.  Otherwise, you will receive a letter about your results with an explanation, but please check with MyChart first.  Please remember to sign up for MyChart if you have not done so, as this will be important to you in the future with finding out test results, communicating by private email, and scheduling acute appointments online when needed.  Please return in 6 months, or sooner if needed, with Lab testing done 3-5 days before  OK to cancel the March follow up visit

## 2016-02-09 NOTE — Assessment & Plan Note (Signed)
stable overall by history and exam, recent data reviewed with pt, and pt to continue medical treatment as before,  to f/u any worsening symptoms or concerns Lab Results  Component Value Date   HGBA1C 5.8 02/05/2016   

## 2016-02-09 NOTE — Assessment & Plan Note (Signed)

## 2016-02-09 NOTE — Assessment & Plan Note (Signed)
Right foot, for podiatry referral, likely needs procedure to remove

## 2016-02-13 ENCOUNTER — Other Ambulatory Visit: Payer: Self-pay | Admitting: *Deleted

## 2016-02-13 MED ORDER — PRAVASTATIN SODIUM 40 MG PO TABS: 40.0000 mg | ORAL_TABLET | Freq: Every day | ORAL | 3 refills | Status: DC

## 2016-02-13 MED ORDER — DILTIAZEM HCL ER BEADS 360 MG PO CP24: 360.0000 mg | ORAL_CAPSULE | Freq: Every day | ORAL | 3 refills | Status: DC

## 2016-02-13 MED ORDER — METOPROLOL TARTRATE 50 MG PO TABS: 50.0000 mg | ORAL_TABLET | Freq: Two times a day (BID) | ORAL | 3 refills | Status: DC

## 2016-02-13 MED ORDER — WARFARIN SODIUM 5 MG PO TABS: ORAL_TABLET | ORAL | 1 refills | Status: DC

## 2016-02-13 MED ORDER — DIGOXIN 125 MCG PO TABS: 125.0000 ug | ORAL_TABLET | Freq: Every day | ORAL | 3 refills | Status: DC

## 2016-02-13 MED ORDER — ALLOPURINOL 100 MG PO TABS: 100.0000 mg | ORAL_TABLET | Freq: Every day | ORAL | 3 refills | Status: DC

## 2016-02-20 DIAGNOSIS — M21961 Unspecified acquired deformity of right lower leg: Secondary | ICD-10-CM | POA: Diagnosis not present

## 2016-02-20 DIAGNOSIS — M21962 Unspecified acquired deformity of left lower leg: Secondary | ICD-10-CM | POA: Diagnosis not present

## 2016-02-20 DIAGNOSIS — D2371 Other benign neoplasm of skin of right lower limb, including hip: Secondary | ICD-10-CM | POA: Diagnosis not present

## 2016-02-20 DIAGNOSIS — L853 Xerosis cutis: Secondary | ICD-10-CM | POA: Diagnosis not present

## 2016-02-20 DIAGNOSIS — D2372 Other benign neoplasm of skin of left lower limb, including hip: Secondary | ICD-10-CM | POA: Diagnosis not present

## 2016-03-05 DIAGNOSIS — D2371 Other benign neoplasm of skin of right lower limb, including hip: Secondary | ICD-10-CM | POA: Diagnosis not present

## 2016-03-05 DIAGNOSIS — B353 Tinea pedis: Secondary | ICD-10-CM | POA: Diagnosis not present

## 2016-03-18 ENCOUNTER — Ambulatory Visit (INDEPENDENT_AMBULATORY_CARE_PROVIDER_SITE_OTHER): Payer: Medicare Other | Admitting: General Practice

## 2016-03-18 DIAGNOSIS — Z5181 Encounter for therapeutic drug level monitoring: Secondary | ICD-10-CM | POA: Diagnosis not present

## 2016-03-18 DIAGNOSIS — Z86718 Personal history of other venous thrombosis and embolism: Secondary | ICD-10-CM

## 2016-03-18 LAB — POCT INR: INR: 2

## 2016-03-18 NOTE — Patient Instructions (Signed)
Pre visit review using our clinic review tool, if applicable. No additional management support is needed unless otherwise documented below in the visit note. 

## 2016-03-18 NOTE — Progress Notes (Signed)
I have reviewed and agree with the plan. 

## 2016-03-19 DIAGNOSIS — B353 Tinea pedis: Secondary | ICD-10-CM | POA: Diagnosis not present

## 2016-03-19 DIAGNOSIS — M79672 Pain in left foot: Secondary | ICD-10-CM | POA: Diagnosis not present

## 2016-03-19 DIAGNOSIS — D2371 Other benign neoplasm of skin of right lower limb, including hip: Secondary | ICD-10-CM | POA: Diagnosis not present

## 2016-03-19 DIAGNOSIS — M79671 Pain in right foot: Secondary | ICD-10-CM | POA: Diagnosis not present

## 2016-04-23 DIAGNOSIS — M79672 Pain in left foot: Secondary | ICD-10-CM | POA: Diagnosis not present

## 2016-04-23 DIAGNOSIS — D2371 Other benign neoplasm of skin of right lower limb, including hip: Secondary | ICD-10-CM | POA: Diagnosis not present

## 2016-04-23 DIAGNOSIS — B353 Tinea pedis: Secondary | ICD-10-CM | POA: Diagnosis not present

## 2016-04-23 DIAGNOSIS — M79671 Pain in right foot: Secondary | ICD-10-CM | POA: Diagnosis not present

## 2016-04-29 ENCOUNTER — Ambulatory Visit: Payer: Medicare Other

## 2016-05-06 ENCOUNTER — Ambulatory Visit: Payer: Medicare Other

## 2016-05-12 ENCOUNTER — Other Ambulatory Visit: Payer: Self-pay | Admitting: Internal Medicine

## 2016-05-13 ENCOUNTER — Ambulatory Visit: Payer: Medicare Other

## 2016-06-16 DIAGNOSIS — D2371 Other benign neoplasm of skin of right lower limb, including hip: Secondary | ICD-10-CM | POA: Diagnosis not present

## 2016-07-22 ENCOUNTER — Ambulatory Visit (INDEPENDENT_AMBULATORY_CARE_PROVIDER_SITE_OTHER): Payer: Medicare Other | Admitting: General Practice

## 2016-07-22 DIAGNOSIS — Z5181 Encounter for therapeutic drug level monitoring: Secondary | ICD-10-CM | POA: Diagnosis not present

## 2016-07-22 DIAGNOSIS — Z86718 Personal history of other venous thrombosis and embolism: Secondary | ICD-10-CM | POA: Diagnosis not present

## 2016-07-22 LAB — POCT INR: INR: 2.8

## 2016-07-22 NOTE — Progress Notes (Signed)
I have reviewed and agree with the plan. 

## 2016-07-22 NOTE — Patient Instructions (Signed)
Pre visit review using our clinic review tool, if applicable. No additional management support is needed unless otherwise documented below in the visit note. 

## 2016-08-03 ENCOUNTER — Encounter: Payer: Self-pay | Admitting: Family

## 2016-08-14 ENCOUNTER — Encounter: Payer: Self-pay | Admitting: Family

## 2016-08-14 ENCOUNTER — Ambulatory Visit
Admission: RE | Admit: 2016-08-14 | Discharge: 2016-08-14 | Disposition: A | Discharge status: Home / Self Care | Payer: Medicare Other | Source: Ambulatory Visit | Attending: Family | Admitting: Family

## 2016-08-14 ENCOUNTER — Ambulatory Visit (INDEPENDENT_AMBULATORY_CARE_PROVIDER_SITE_OTHER): Payer: Medicare Other | Admitting: Family

## 2016-08-14 VITALS — BP 137/86 | HR 79 | Temp 97.1°F | Resp 20 | Ht 73.5 in | Wt 310.0 lb

## 2016-08-14 DIAGNOSIS — K7689 Other specified diseases of liver: Secondary | ICD-10-CM | POA: Diagnosis not present

## 2016-08-14 DIAGNOSIS — I723 Aneurysm of iliac artery: Secondary | ICD-10-CM

## 2016-08-14 DIAGNOSIS — Z95828 Presence of other vascular implants and grafts: Secondary | ICD-10-CM

## 2016-08-14 MED ORDER — IOPAMIDOL (ISOVUE-370) INJECTION 76%
100.0000 mL | Freq: Once | INTRAVENOUS | Status: AC | PRN
  Administered 2016-08-14: 100 mL via INTRAVENOUS

## 2016-08-14 NOTE — Progress Notes (Signed)
[facility] [place]  CC: Follow up s/p bilateral CIA aneurysm stent graft repair   History of Present Illness  NIVIN BRANIFF is a 66 y.o. (03-Jun-1950) male who presents for routine follow up s/p EVAR (Date: 08/11/11) and L IIA embolization (Date: 07/30/11) by Dr. Bridgett Larsson for B CIA aneurysms.  EVAR duplex (Date: 10/20/13) demonstrated: no endoleak and shrinking iliac aneurysm sac sizes.  CT (Date: 10/14/2012) demonstrates: no endoleak and decreasing iliac sac sizes to limb size.   The patient denies any buttock, thighs, or calves claudication, denies non healing wounds.   The patient denies back or abdominal pain. The patient is a former smoker. The patient denies history of stroke or TIA symptoms. He is trying to lose weight, is exercising 6 days/week.  Pt Diabetic: No Pt smoker: former smoker, quit in 2008  He takes coumadin for atrial fib, prescribed by his cardiologist.   Past Medical History:  Diagnosis Date  . AAA (abdominal aortic aneurysm) [facility]) July 2013   Abdominal Aortic Endovascular Stent Graft 08/11/11. Bilateral. Right is 3.2 & the left is 3.6. "Preclose" repair bilateral common femoral artery. Placement of catheter in aorta x 2. Repair of aorta w/modular bifurcated prosthesis w/bilateral limbs.  Placement of right limb extension: 23 mm x 12 cm. Placement of left limb extension: 14 mm x 7 cm.  . Anemia    From GI bleed in 05/11/11.   [name] Chronic anticoagulation, PE DVT PAF   . Chronic pain   . Depression   . Dyslipidemia   . Edema of left lower extremity    S/P I&D 08/30/06  . GI bleed 04/2011   With anemia. EGD & colonoscopy 05/11/2011  . History of alcohol dependence ([place])   . HTN (hypertension)   . Hyperlipemia   . Hypertension   . Iliac artery aneurysm, bilateral (HCC)    PVD, bilat common iliac aneurysms [443.9]  . Iliopsoas muscle hematoma 04/2011  . Lung nodule 2008  . Morbid obesity ([place])   . Morbid obesity with BMI of 40.0-44.9, adult ([place])    . Obstructive sleep apnea July 2013   Not wearing CPAP. Split-Night sleep study 06/03/11 AHI = 27.43/hr (TOTAL SLEEP TIME 105 minutes) and REM AHI = 120.00/hr  . Osteoarthritis of both knees    Bilateral Arthroscopy in 2000 & 2008. Both were complicated w/venous thromboembolism S/P IVC filter placement due to one being massive PE in 2008.  [name] PAF (paroxysmal atrial fibrillation)/atrial flutter 02/28/2010   ECHO : normal LV size, moderate concentricLVH, normal EF, normal atrial sizes, RV was normal but previously dilated;;  Myoview : Normal perfusion study. Low risk scan.  [name] PAH (pulmonary artery hypertension) ([place])   . Prolonged QT interval   . Psoriasis   . Pulmonary embolism ([place]) 2008   DVT --> Massive PE with right-sided strain. On coumadin. ECHO 02/28/10 showed normal LV size, moderate concentricLVH, normal EF, normal atrial sizes, RV was normal but previously dilated.   Past Surgical History:  Procedure Laterality Date  . ABDOMINAL AORTAGRAM N/A 05/14/2011   Procedure: ABDOMINAL [name];  Surgeon: Conrad [place], MD;  Location: [facility] CATH LAB;  Service: Cardiovascular;  Laterality: N/A;  . Abdominal Aortic Evar  08/11/11   Bilateral. Right is 3.2 & the left is 3.6. "Preclose" repair bilateral common femoral artery. Placement of catheter in aorta x 2. Repair of aorta w/modular bifurcated prosthesis w/bilateral limbs.  Placement of right limb extension: 23 mm x 12 cm. Placement of left limb  extension: 14 mm x 7 cm.  . ACHILLES TENDON SURGERY    . COLONOSCOPY  05/11/2011   For GI bleed. Performed by Dr. Juanita Craver, MD. Also had a EGD performed at this time.  . COLONOSCOPY  05/12/2011  . EMBOLIZATION N/A 07/30/2011   Procedure: EMBOLIZATION;  Surgeon: Conrad [place], MD;  Location: [facility] CATH LAB;  Service: Cardiovascular;  Laterality: N/A;  . ESOPHAGOGASTRODUODENOSCOPY  05/08/2011   For GI bleed/Anemia. Also had a colonoscopy at this time.  . INSERTION OF VENA CAVA FILTER N/A 05/14/2011   Procedure:  INSERTION OF VENA CAVA FILTER;  Surgeon: Conrad [place] Dezza, MD;  Location: [facility] CATH LAB;  Service: Cardiovascular;  Laterality: N/A;  . KNEE ARTHROSCOPY Bilateral 2000 & 2008   DJD knees. Both were complicated w/venous thromboembolism S/P IVC filter placement due to one being massive PE in 2008.  [name] KNEE SURGERY  1999  . LOWER EXTREMITY ANGIOGRAM N/A 05/14/2011   Procedure: LOWER EXTREMITY ANGIOGRAM;  Surgeon: Conrad [place], MD;  Location: [facility] CATH LAB;  Service: Cardiovascular;  Laterality: N/A;   Social History Social History  Substance Use Topics  . Smoking status: Former Smoker    Packs/day: 0.50    Years: 2.00    Quit date: 07/05/2006  . Smokeless tobacco: Never Used  . Alcohol use 0.6 - 1.2 oz/week    1 - 2 Shots of liquor per week   Family History Family History  Problem Relation Age of Onset  . Cancer Mother        Pancreatic  . Arthritis Mother   . Arthritis Father   . CAD Father 5  . Cancer Brother        Colon  . Fainting Neg Hx    Current Outpatient Prescriptions on File Prior to Visit  Medication Sig Dispense Refill  . allopurinol (ZYLOPRIM) 100 MG tablet Take 1 tablet (100 mg total) by mouth daily. 90 tablet 3  . clotrimazole (LOTRIMIN) 1 % cream Apply topically 2 (two) times daily. 30 g 1  . desonide (DESOWEN) 0.05 % cream Apply 1 application topically daily. Apply to rash on face    . digoxin (LANOXIN) 0.125 MG tablet Take 1 tablet (125 mcg total) by mouth daily. 90 tablet 3  . diltiazem (TIAZAC) 360 MG 24 hr capsule Take 1 capsule (360 mg total) by mouth daily. 90 capsule 3  . fluocinonide ointment (LIDEX) 9.37 % Apply 1 application topically 2 (two) times daily. Apply to dry skin areas on body    . fluocinonide-emollient (LIDEX-E) 0.05 % cream Apply 1 application topically 2 (two) times daily. 454 g 0  . ketoconazole (NIZORAL) 200 MG tablet Take 1 tablet (200 mg total) by mouth daily. 7 tablet 0  . metoprolol (LOPRESSOR) 50 MG tablet Take 1 tablet (50 mg total) by mouth  2 (two) times daily. 180 tablet 3  . ondansetron (ZOFRAN-ODT) 8 MG disintegrating tablet Take 8 mg by mouth every 8 (eight) hours as needed.     [name] oxyCODONE-acetaminophen (PERCOCET) 10-325 MG per tablet Take 1 tablet by mouth every 6 (six) hours as needed. Pain. 30 tablet 0  . pravastatin (PRAVACHOL) 40 MG tablet Take 1 tablet (40 mg total) by mouth daily. 90 tablet 3  . sildenafil (VIAGRA) 100 MG tablet Take 0.5-1 tablets (50-100 mg total) by mouth daily as needed for erectile dysfunction. 5 tablet 11  . triamcinolone ointment (KENALOG) 0.1 % Apply 1 application topically daily. Apply to skin rash areas on body 454  g 0  . warfarin (COUMADIN) 5 MG tablet TAKE AS DIRECTED BY  COUMADIN CLINIC 120 tablet 1  . zolpidem (AMBIEN) 5 MG tablet Take 5 mg by mouth at bedtime as needed.     [name] levofloxacin (LEVAQUIN) 250 MG tablet Take 1 tablet (250 mg total) by mouth daily. (Patient not taking: Reported on 02/05/2016) 10 tablet 0   No current facility-administered medications on file prior to visit.    Allergies  Allergen Reactions  . Crestor [Rosuvastatin] Swelling     ROS: See HPI for pertinent positives and negatives.  Physical Examination  Vitals:   08/14/16 1216  BP: 137/86  Pulse: 79  Resp: 20  Temp: (!) 97.1 F (36.2 C)  TempSrc: Oral  SpO2: 98%  Weight: (!) 310 lb (140.6 kg)  Height: 6' 1.5" (1.867 m)   Body mass index is 40.35 kg/m.  General: A&O x 3, WD, morbidly obese male  Eyes: PERRLA Pulmonary: Sym exp, respirations are non labored, distant breath sounds, CTAB, no rales, rhonchi, orwheezing  Cardiac: Irregular rhythm, controlled rate, no murmur detected  Vascular:2+ bilateral DP and PT pulses, right popliteal pulse is 2+ palpable, left popliteal pulse is not palpable, femoral pulses are not palpable (pt is morbidly obese). Aorta is not palpable. Gastrointestinal: soft, NTND, -G/R, - HSM, - masses palpated, - CVAT B, obese, large panus. Musculoskeletal: M/S 5/5  throughout , Extremities without ischemic changes  Neurologic: Pain and light touch intact in extremities , Motor exam as listed above.    DATA CTA abd/pelvis (08/14/16): Aorta: Aortic portion of the endograft shows stable positioning without evidence of slippage. The endograft is normally patent. No evidence of aortic aneurysm.  Celiac: Normally patent.  SMA: Normally patent.  Renals: Bilateral single renal arteries are widely patent.  IMA: Occluded.  Inflow: Thrombosed portion of the excluded right common iliac artery aneurysm shows further diminishment in size. Maximal diameter at the level of thrombus and adjacent endograft is 3.3 cm compared to 3.7 cm previously. No evidence of endoleak. Distal uncovered segment of the common iliac artery shows stable caliber and internal and external iliac arteries are normally patent.  Left common iliac artery aneurysm has completely shrunken around the endograft with diameter of the vessel now corresponding only to the endograft itself. No endoleak. Internal iliac artery remains occluded by embolization coils. The external iliac artery is normally patent.  Proximal Outflow: Common femoral arteries and bilateral femoral bifurcations are normally patent.  Veins: The indwelling IVC filter shows relatively stable tilting with the apex located at the inferior orifice of the right renal vein. No evidence of thrombus within the filter.  IMPRESSION: VASCULAR  1. Further diminishment in size of bilateral excluded common iliac artery aneurysms. There only is a minimal component of thrombosed aneurysm adjacent to the right iliac endograft limb. The left iliac aneurysm has now shrunken completely around the endograft limb. No endoleak is visualized. Aortic portion of the endograft is stable in position. 2. Stable tilted IVC filter with apex located along the inferior orifice of the right renal vein. There is no evidence of  thrombus within the IVC filter.   NON-VASCULAR  Stable hepatic cyst.   Medical Decision Making  [name] is a 66 y.o. male who presents s/p EVAR (Date 08/11/11) and L IIA embolization (Date: 07/30/11) by Dr. Bridgett Larsson for B CIA aneurysms.   Pt is asymptomatic with decreased sac size of right CIA aneurysm from 3.7 cm to 3.3 cm by CT today. Left common  iliac artery aneurysm has completely shrunken around the endograft with diameter of the vessel now corresponding only to the endograft itself. No endoleak.  Pt had CTA abd/pelvis earlier today. Dr. Donzetta Matters viewed the images and found no evidence of endoleak and no change compared to previous imaging.   I discussed with the patient the importance of surveillance of the endograft. No endoleak.   The next endograft duplex will be scheduled for 12 months.  The patient will follow up with [place] in 12 months with these studies.  I emphasized the importance of maximal medical management including strict control of blood pressure, blood glucose, and lipid levels, antiplatelet agents, obtaining regular exercise, and cessation of smoking.   Thank you for allowing [place] to participate in this patient's care.  Clemon Chambers, RN, MSN, FNP-C [facility] [place] Office: [phone]  Clinic Physician: Donzetta Matters  08/14/2016, 12:29 PM

## 2016-08-14 NOTE — Patient Instructions (Signed)
Before your next abdominal ultrasound:  Take two Extra-Strength Gas-X capsules at bedtime the night before the test. Take another two Extra-Strength Gas-X capsules 3 hours before the test.  Avoid gas forming foods the day before the test.       

## 2016-08-17 NOTE — Addendum Note (Signed)
Addended by: Lianne Cure A on: 08/17/2016 03:36 PM   Modules accepted: Orders

## 2016-09-01 NOTE — Patient Instructions (Signed)
Pre visit review using our clinic review tool, if applicable. No additional management support is needed unless otherwise documented below in the visit note. 

## 2016-09-02 ENCOUNTER — Ambulatory Visit (INDEPENDENT_AMBULATORY_CARE_PROVIDER_SITE_OTHER): Payer: Medicare Other | Admitting: General Practice

## 2016-09-02 DIAGNOSIS — Z5181 Encounter for therapeutic drug level monitoring: Secondary | ICD-10-CM

## 2016-09-02 DIAGNOSIS — Z86718 Personal history of other venous thrombosis and embolism: Secondary | ICD-10-CM

## 2016-09-09 ENCOUNTER — Ambulatory Visit (INDEPENDENT_AMBULATORY_CARE_PROVIDER_SITE_OTHER): Payer: Medicare Other | Admitting: Internal Medicine

## 2016-09-09 ENCOUNTER — Ambulatory Visit: Payer: Medicare Other | Admitting: Internal Medicine

## 2016-09-09 ENCOUNTER — Ambulatory Visit: Payer: Self-pay | Admitting: General Practice

## 2016-09-09 ENCOUNTER — Ambulatory Visit (INDEPENDENT_AMBULATORY_CARE_PROVIDER_SITE_OTHER): Payer: Medicare Other | Admitting: General Practice

## 2016-09-09 ENCOUNTER — Encounter: Payer: Self-pay | Admitting: Internal Medicine

## 2016-09-09 VITALS — BP 118/74 | HR 81 | Temp 98.5°F | Ht 73.5 in | Wt 309.0 lb

## 2016-09-09 DIAGNOSIS — Z5181 Encounter for therapeutic drug level monitoring: Secondary | ICD-10-CM

## 2016-09-09 DIAGNOSIS — R739 Hyperglycemia, unspecified: Secondary | ICD-10-CM | POA: Diagnosis not present

## 2016-09-09 DIAGNOSIS — Z86718 Personal history of other venous thrombosis and embolism: Secondary | ICD-10-CM

## 2016-09-09 DIAGNOSIS — I10 Essential (primary) hypertension: Secondary | ICD-10-CM

## 2016-09-09 DIAGNOSIS — Z23 Encounter for immunization: Secondary | ICD-10-CM | POA: Diagnosis not present

## 2016-09-09 DIAGNOSIS — E785 Hyperlipidemia, unspecified: Secondary | ICD-10-CM | POA: Diagnosis not present

## 2016-09-09 LAB — POCT INR: INR: 3.3

## 2016-09-09 NOTE — Progress Notes (Signed)
I have reviewed and agree with the plan. 

## 2016-09-09 NOTE — Assessment & Plan Note (Signed)
stable overall by history and exam, recent data reviewed with pt, and pt to continue medical treatment as before,  to f/u any worsening symptoms or concerns BP Readings from Last 3 Encounters:  09/09/16 118/74  08/14/16 137/86  02/05/16 128/82

## 2016-09-09 NOTE — Progress Notes (Signed)
Subjective:    Patient ID: [name], male    DOB: [birth year], 66 y.o.   MRN: [id number]  HPI  Here to f/u; overall doing ok,  Pt denies chest pain, increasing sob or doe, wheezing, orthopnea, PND, increased LE swelling, palpitations, dizziness or syncope.  Pt denies new neurological symptoms such as new headache, or facial or extremity weakness or numbness.  Pt denies polydipsia, polyuria, or low sugar episode.  Pt states overall good compliance with meds, mostly trying to follow appropriate diet, with wt overall stable,  but little exercise however. No new complaints. Did have a rash to the right ankle a few days ago, but used some otc lotion and now resolved by the time of his appt.  No other rash or worsening swelling. Does have some psoriatic plaque to right upper back chronic as well. Past Medical History:  Diagnosis Date  . AAA (abdominal aortic aneurysm) [facility]) July 2013   Abdominal Aortic Endovascular Stent Graft 08/11/11. Bilateral. Right is 3.2 & the left is 3.6. "Preclose" repair bilateral common femoral artery. Placement of catheter in aorta x 2. Repair of aorta w/modular bifurcated prosthesis w/bilateral limbs.  Placement of right limb extension: 23 mm x 12 cm. Placement of left limb extension: 14 mm x 7 cm.  . Anemia    From GI bleed in 05/11/11.   [name] Chronic anticoagulation, PE DVT PAF   . Chronic pain   . Depression   . Dyslipidemia   . Edema of left lower extremity    S/P I&D 08/30/06  . GI bleed 04/2011   With anemia. EGD & colonoscopy 05/11/2011  . History of alcohol dependence ([place])   . HTN (hypertension)   . Hyperlipemia   . Hypertension   . Iliac artery aneurysm, bilateral (HCC)    PVD, bilat common iliac aneurysms [443.9]  . Iliopsoas muscle hematoma 04/2011  . Lung nodule 2008  . Morbid obesity ([place])   . Morbid obesity with BMI of 40.0-44.9, adult ([place])   . Obstructive sleep apnea July 2013   Not wearing CPAP. Split-Night sleep study 06/03/11 AHI = 27.43/hr (TOTAL  SLEEP TIME 105 minutes) and REM AHI = 120.00/hr  . Osteoarthritis of both knees    Bilateral Arthroscopy in 2000 & 2008. Both were complicated w/venous thromboembolism S/P IVC filter placement due to one being massive PE in 2008.  [name] PAF (paroxysmal atrial fibrillation)/atrial flutter 02/28/2010   ECHO : normal LV size, moderate concentricLVH, normal EF, normal atrial sizes, RV was normal but previously dilated;;  Myoview : Normal perfusion study. Low risk scan.  [name] PAH (pulmonary artery hypertension) ([employer])   . Prolonged QT interval   . Psoriasis   . Pulmonary embolism ([place]) 2008   DVT --> Massive PE with right-sided strain. On coumadin. ECHO 02/28/10 showed normal LV size, moderate concentricLVH, normal EF, normal atrial sizes, RV was normal but previously dilated.   Past Surgical History:  Procedure Laterality Date  . ABDOMINAL AORTAGRAM N/A 05/14/2011   Procedure: ABDOMINAL [name];  Surgeon: Conrad [place], MD;  Location: [facility] CATH LAB;  Service: Cardiovascular;  Laterality: N/A;  . Abdominal Aortic Evar  08/11/11   Bilateral. Right is 3.2 & the left is 3.6. "Preclose" repair bilateral common femoral artery. Placement of catheter in aorta x 2. Repair of aorta w/modular bifurcated prosthesis w/bilateral limbs.  Placement of right limb extension: 23 mm x 12 cm. Placement of left limb extension: 14 mm x 7 cm.  . ACHILLES TENDON SURGERY    .  COLONOSCOPY  05/11/2011   For GI bleed. Performed by Dr. Juanita Craver, MD. Also had a EGD performed at this time.  . COLONOSCOPY  05/12/2011  . EMBOLIZATION N/A 07/30/2011   Procedure: EMBOLIZATION;  Surgeon: Conrad [place], MD;  Location: [facility] CATH LAB;  Service: Cardiovascular;  Laterality: N/A;  . ESOPHAGOGASTRODUODENOSCOPY  05/08/2011   For GI bleed/Anemia. Also had a colonoscopy at this time.  . INSERTION OF VENA CAVA FILTER N/A 05/14/2011   Procedure: INSERTION OF VENA CAVA FILTER;  Surgeon: Conrad [place], MD;  Location: [facility] CATH LAB;  Service: Cardiovascular;   Laterality: N/A;  . KNEE ARTHROSCOPY Bilateral 2000 & 2008   DJD knees. Both were complicated w/venous thromboembolism S/P IVC filter placement due to one being massive PE in 2008.  [name] KNEE SURGERY  1999  . LOWER EXTREMITY ANGIOGRAM N/A 05/14/2011   Procedure: LOWER EXTREMITY ANGIOGRAM;  Surgeon: Conrad [place], MD;  Location: [facility] CATH LAB;  Service: Cardiovascular;  Laterality: N/A;    reports that he quit smoking about 10 years ago. He has a 1.00 pack-year smoking history. He has never used smokeless tobacco. He reports that he drinks about 0.6 - 1.2 oz of alcohol per week . He reports that he uses drugs. family history includes Arthritis in his father and mother; CAD (age of onset: 63) in his father; Cancer in his brother and mother. Allergies  Allergen Reactions  . Crestor [Rosuvastatin] Swelling   Current Outpatient Prescriptions on File Prior to Visit  Medication Sig Dispense Refill  . allopurinol (ZYLOPRIM) 100 MG tablet Take 1 tablet (100 mg total) by mouth daily. 90 tablet 3  . clotrimazole (LOTRIMIN) 1 % cream Apply topically 2 (two) times daily. 30 g 1  . desonide (DESOWEN) 0.05 % cream Apply 1 application topically daily. Apply to rash on face    . digoxin (LANOXIN) 0.125 MG tablet Take 1 tablet (125 mcg total) by mouth daily. 90 tablet 3  . diltiazem (TIAZAC) 360 MG 24 hr capsule Take 1 capsule (360 mg total) by mouth daily. 90 capsule 3  . fluocinonide ointment (LIDEX) 5.78 % Apply 1 application topically 2 (two) times daily. Apply to dry skin areas on body    . fluocinonide-emollient (LIDEX-E) 0.05 % cream Apply 1 application topically 2 (two) times daily. 454 g 0  . ketoconazole (NIZORAL) 200 MG tablet Take 1 tablet (200 mg total) by mouth daily. 7 tablet 0  . metoprolol (LOPRESSOR) 50 MG tablet Take 1 tablet (50 mg total) by mouth 2 (two) times daily. 180 tablet 3  . oxyCODONE-acetaminophen (PERCOCET) 10-325 MG per tablet Take 1 tablet by mouth every 6 (six) hours as needed. Pain.  30 tablet 0  . pravastatin (PRAVACHOL) 40 MG tablet Take 1 tablet (40 mg total) by mouth daily. 90 tablet 3  . sildenafil (VIAGRA) 100 MG tablet Take 0.5-1 tablets (50-100 mg total) by mouth daily as needed for erectile dysfunction. 5 tablet 11  . triamcinolone ointment (KENALOG) 0.1 % Apply 1 application topically daily. Apply to skin rash areas on body 454 g 0  . warfarin (COUMADIN) 5 MG tablet TAKE AS DIRECTED BY  COUMADIN CLINIC 120 tablet 1  . zolpidem (AMBIEN) 5 MG tablet Take 5 mg by mouth at bedtime as needed.      No current facility-administered medications on file prior to visit.    Review of Systems  Constitutional: Negative for other unusual diaphoresis or sweats HENT: Negative for ear discharge or swelling Eyes: Negative  for other worsening visual disturbances Respiratory: Negative for stridor or other swelling  Gastrointestinal: Negative for worsening distension or other blood Genitourinary: Negative for retention or other urinary change Musculoskeletal: Negative for other MSK pain or swelling Skin: Negative for color change or other new lesions Neurological: Negative for worsening tremors and other numbness  Psychiatric/Behavioral: Negative for worsening agitation or other fatigue All other system neg per pt    Objective:   Physical Exam BP 118/74   Pulse 81   Temp 98.5 F (36.9 C) (Oral)   Ht 6' 1.5" (1.867 m)   Wt (!) 309 lb (140.2 kg)   SpO2 98%   BMI 40.21 kg/m  VS noted, morbid obese Constitutional: Pt appears in NAD HENT: Head: NCAT.  Right Ear: External ear normal.  Left Ear: External ear normal.  Eyes: . Pupils are equal, round, and reactive to light. Conjunctivae and EOM are normal Nose: without d/c or deformity Neck: Neck supple. Gross normal ROM Cardiovascular: Normal rate and regular rhythm.   Pulmonary/Chest: Effort normal and breath sounds without rales or wheezing.  Neurological: Pt is alert. At baseline orientation, motor grossly  intact Skin: Skin is warm. No rashes, other new lesions, has chrnic 1+ bilat LE edema Psychiatric: Pt behavior is normal without agitation      Assessment & Plan:

## 2016-09-09 NOTE — Patient Instructions (Signed)
Pre visit review using our clinic review tool, if applicable. No additional management support is needed unless otherwise documented below in the visit note. 

## 2016-09-09 NOTE — Patient Instructions (Addendum)
You had the flu shot today  Please continue all other medications as before, and refills have been done if requested.  Please have the pharmacy call with any other refills you may need.  Please continue your efforts at being more active, low cholesterol diet, and weight control.  Please keep your appointments with your specialists as you may have planned  Please see Jenny Reichmann before leaving today

## 2016-09-09 NOTE — Assessment & Plan Note (Signed)
stable overall by history and exam, recent data reviewed with pt, and pt to continue medical treatment as before,  to f/u any worsening symptoms or concerns Lab Results  Component Value Date   LDLCALC 97 02/05/2016

## 2016-09-09 NOTE — Assessment & Plan Note (Signed)
Lab Results  Component Value Date   HGBA1C 5.8 02/05/2016  stable overall by history and exam, recent data reviewed with pt, and pt to continue medical treatment as before,  to f/u any worsening symptoms or concerns, declines f/u labs today

## 2016-10-06 ENCOUNTER — Other Ambulatory Visit: Payer: Self-pay | Admitting: Internal Medicine

## 2016-10-07 ENCOUNTER — Ambulatory Visit (INDEPENDENT_AMBULATORY_CARE_PROVIDER_SITE_OTHER): Payer: Medicare Other | Admitting: General Practice

## 2016-10-07 DIAGNOSIS — Z7901 Long term (current) use of anticoagulants: Secondary | ICD-10-CM

## 2016-10-07 DIAGNOSIS — Z86718 Personal history of other venous thrombosis and embolism: Secondary | ICD-10-CM | POA: Diagnosis not present

## 2016-10-07 DIAGNOSIS — Z5181 Encounter for therapeutic drug level monitoring: Secondary | ICD-10-CM

## 2016-10-07 LAB — POCT INR: INR: 2.7

## 2016-10-07 NOTE — Progress Notes (Signed)
I have reviewed and agree with the plan. 

## 2016-10-07 NOTE — Patient Instructions (Signed)
Pre visit review using our clinic review tool, if applicable. No additional management support is needed unless otherwise documented below in the visit note. 

## 2016-10-30 ENCOUNTER — Ambulatory Visit: Payer: Commercial Managed Care - HMO | Admitting: Vascular Surgery

## 2016-11-18 ENCOUNTER — Ambulatory Visit: Payer: Medicare Other

## 2016-11-25 ENCOUNTER — Ambulatory Visit: Payer: Medicare Other

## 2016-12-02 ENCOUNTER — Ambulatory Visit (INDEPENDENT_AMBULATORY_CARE_PROVIDER_SITE_OTHER): Payer: Medicare Other | Admitting: General Practice

## 2016-12-02 DIAGNOSIS — Z7901 Long term (current) use of anticoagulants: Secondary | ICD-10-CM | POA: Diagnosis not present

## 2016-12-02 DIAGNOSIS — Z86718 Personal history of other venous thrombosis and embolism: Secondary | ICD-10-CM | POA: Diagnosis not present

## 2016-12-02 LAB — POCT INR: INR: 3.4

## 2016-12-02 NOTE — Patient Instructions (Addendum)
Pre visit review using our clinic review tool, if applicable. No additional management support is needed unless otherwise documented below in the visit note.  Skip coumadin today and then continue to take 1 1/2 tablets (7.5 mg) daily except 1 tablet (5 mg) on Mondays/Thursdays.  Re-check in 4 weeks at patient request.

## 2017-01-15 ENCOUNTER — Ambulatory Visit: Payer: Medicare Other

## 2017-01-15 ENCOUNTER — Other Ambulatory Visit: Payer: Self-pay

## 2017-01-15 MED ORDER — DILTIAZEM HCL ER BEADS 360 MG PO CP24: 360.0000 mg | ORAL_CAPSULE | Freq: Every day | ORAL | 0 refills | Status: DC

## 2017-01-15 MED ORDER — PRAVASTATIN SODIUM 40 MG PO TABS: 40.0000 mg | ORAL_TABLET | Freq: Every day | ORAL | 0 refills | Status: DC

## 2017-01-15 MED ORDER — ALLOPURINOL 100 MG PO TABS: 100.0000 mg | ORAL_TABLET | Freq: Every day | ORAL | 0 refills | Status: DC

## 2017-01-15 MED ORDER — METOPROLOL TARTRATE 50 MG PO TABS: 50.0000 mg | ORAL_TABLET | Freq: Two times a day (BID) | ORAL | 0 refills | Status: DC

## 2017-01-15 MED ORDER — DIGOXIN 125 MCG PO TABS: 125.0000 ug | ORAL_TABLET | Freq: Every day | ORAL | 0 refills | Status: DC

## 2017-01-15 MED ORDER — WARFARIN SODIUM 5 MG PO TABS: ORAL_TABLET | ORAL | 0 refills | Status: DC

## 2017-01-18 ENCOUNTER — Telehealth: Payer: Self-pay | Admitting: Internal Medicine

## 2017-01-18 MED ORDER — SILDENAFIL CITRATE 100 MG PO TABS: 50.0000 mg | ORAL_TABLET | Freq: Every day | ORAL | 11 refills | Status: DC | PRN

## 2017-01-18 NOTE — Telephone Encounter (Signed)
Copied from [place] [id number]. Topic: Quick Communication - See Telephone Encounter >> Jan 18, 2017  1:51 PM Vernona Rieger wrote: CRM for notification. See Telephone encounter for:   01/18/17.  Pt wants a script for Viagra (a generic brand) [facility] [place]

## 2017-01-22 ENCOUNTER — Ambulatory Visit (INDEPENDENT_AMBULATORY_CARE_PROVIDER_SITE_OTHER): Payer: Medicare Other | Admitting: General Practice

## 2017-01-22 DIAGNOSIS — Z7901 Long term (current) use of anticoagulants: Secondary | ICD-10-CM | POA: Diagnosis not present

## 2017-01-22 DIAGNOSIS — Z86718 Personal history of other venous thrombosis and embolism: Secondary | ICD-10-CM

## 2017-01-22 LAB — POCT INR: INR: 2.9

## 2017-01-22 NOTE — Patient Instructions (Addendum)
Pre visit review using our clinic review tool, if applicable. No additional management support is needed unless otherwise documented below in the visit note.  Continue to take 1 1/2 tablets (7.5 mg) daily except 1 tablet (5 mg) on Mondays/Thursdays.  Re-check in 4 weeks at patient request.

## 2017-01-25 MED ORDER — SILDENAFIL CITRATE 100 MG PO TABS: 50.0000 mg | ORAL_TABLET | Freq: Every day | ORAL | 11 refills | Status: DC | PRN

## 2017-01-25 NOTE — Telephone Encounter (Signed)
Med has been resent

## 2017-01-25 NOTE — Addendum Note (Signed)
Addended by: Juliet Rude on: 01/25/2017 09:08 AM   Modules accepted: Orders

## 2017-01-25 NOTE — Telephone Encounter (Signed)
Patient said that the pharmacy does not have this prescription. Can you resend it please?

## 2017-01-27 ENCOUNTER — Telehealth: Payer: Self-pay | Admitting: Internal Medicine

## 2017-01-27 NOTE — Telephone Encounter (Signed)
Patient phoned with questions regarding drinking alcohol while taking viagra. Explained to him the effects alcohol would have, including decreased blood flow to the penis so it might negate the reason for taking the viagra. He stated he understood.

## 2017-02-19 ENCOUNTER — Ambulatory Visit (INDEPENDENT_AMBULATORY_CARE_PROVIDER_SITE_OTHER): Payer: Medicare Other | Admitting: General Practice

## 2017-02-19 DIAGNOSIS — Z7901 Long term (current) use of anticoagulants: Secondary | ICD-10-CM | POA: Diagnosis not present

## 2017-02-19 DIAGNOSIS — Z86718 Personal history of other venous thrombosis and embolism: Secondary | ICD-10-CM

## 2017-02-19 LAB — POCT INR: INR: 2.7

## 2017-02-19 NOTE — Patient Instructions (Addendum)
Pre visit review using our clinic review tool, if applicable. No additional management support is needed unless otherwise documented below in the visit note.  Continue to take 1 1/2 tablets (7.5 mg) daily except 1 tablet (5 mg) on Mondays/Thursdays.  Re-check in 6 weeks at patient request. 

## 2017-04-02 ENCOUNTER — Ambulatory Visit (INDEPENDENT_AMBULATORY_CARE_PROVIDER_SITE_OTHER): Payer: Medicare Other | Admitting: General Practice

## 2017-04-02 DIAGNOSIS — Z86718 Personal history of other venous thrombosis and embolism: Secondary | ICD-10-CM | POA: Diagnosis not present

## 2017-04-02 DIAGNOSIS — Z7901 Long term (current) use of anticoagulants: Secondary | ICD-10-CM | POA: Diagnosis not present

## 2017-04-02 LAB — POCT INR: INR: 2.8

## 2017-04-02 NOTE — Patient Instructions (Signed)
Pre visit review using our clinic review tool, if applicable. No additional management support is needed unless otherwise documented below in the visit note.  Continue to take 1 1/2 tablets (7.5 mg) daily except 1 tablet (5 mg) on Mondays/Thursdays.  Re-check in 6 weeks at patient request. 

## 2017-05-04 ENCOUNTER — Other Ambulatory Visit: Payer: Self-pay | Admitting: Internal Medicine

## 2017-05-17 ENCOUNTER — Telehealth: Payer: Self-pay | Admitting: Internal Medicine

## 2017-05-17 NOTE — Telephone Encounter (Signed)
Copied from [place] [id number]. Topic: Quick Communication - Rx Refill/Question >> May 17, 2017 11:51 AM Neva Seat wrote: sildenafil (VIAGRA) 100 MG tablet  Pt wanting to change Rx to [facility] Name - [place], [place] - [address] [place] [address] Phone: [phone] Fax: [phone]

## 2017-05-18 MED ORDER — VIAGRA 100 MG PO TABS: 50.0000 mg | ORAL_TABLET | Freq: Every day | ORAL | 11 refills | Status: DC | PRN

## 2017-05-18 NOTE — Telephone Encounter (Signed)
Ok done erx 

## 2017-05-18 NOTE — Telephone Encounter (Signed)
Viagra 100 mg   Pt requesting brand name - Viagra   See attached  Dr. Gwynn Burly pt.

## 2017-05-18 NOTE — Addendum Note (Signed)
Addended by: Biagio Borg on: 05/18/2017 12:40 PM   Modules accepted: Orders

## 2017-05-21 ENCOUNTER — Ambulatory Visit: Payer: Medicare Other

## 2017-05-27 ENCOUNTER — Other Ambulatory Visit: Payer: Self-pay | Admitting: General Practice

## 2017-05-27 ENCOUNTER — Other Ambulatory Visit: Payer: Self-pay | Admitting: Internal Medicine

## 2017-05-27 MED ORDER — WARFARIN SODIUM 5 MG PO TABS: ORAL_TABLET | ORAL | 0 refills | Status: DC

## 2017-05-28 ENCOUNTER — Ambulatory Visit (INDEPENDENT_AMBULATORY_CARE_PROVIDER_SITE_OTHER): Payer: Medicare Other | Admitting: General Practice

## 2017-05-28 DIAGNOSIS — Z86718 Personal history of other venous thrombosis and embolism: Secondary | ICD-10-CM

## 2017-05-28 DIAGNOSIS — Z7901 Long term (current) use of anticoagulants: Secondary | ICD-10-CM

## 2017-05-28 LAB — POCT INR: INR: 3.4

## 2017-05-28 NOTE — Patient Instructions (Addendum)
Pre visit review using our clinic review tool, if applicable. No additional management support is needed unless otherwise documented below in the visit note.  Hold coumadin today and then continue to take 1 1/2 tablets (7.5 mg) daily except 1 tablet (5 mg) on Mondays/Thursdays.  Re-check in 6 weeks at patient request.

## 2017-06-11 ENCOUNTER — Ambulatory Visit: Payer: Self-pay | Admitting: *Deleted

## 2017-06-11 NOTE — Telephone Encounter (Signed)
Both would be fine; he could get this all in one OTC supplement called   "B complex multivitamin"  OK to let pt know

## 2017-06-11 NOTE — Telephone Encounter (Signed)
Pt has been informed and expressed understanding.  

## 2017-06-11 NOTE — Telephone Encounter (Signed)
Pt called wanting to know if he should take Vit B 6 or B 12 for leg cramps. He already as the Vitamin B 12. He states at night he is having cramping when he stretches. No other symptoms. He is also on coumadin, advised him to check with the pharmacist also before starting this. Appointment made per his request for June 28 th.  LOV was 09/09/16 Will route to flow at [facility] at [facility].  Reason for Disposition . Leg pain or muscle cramp is a chronic symptom (recurrent or ongoing AND present > 4 weeks)  Answer Assessment - Initial Assessment Questions 1. ONSET: "When did the pain start?"      5 or 6 months 2. LOCATION: "Where is the pain located?"      Both legs in calves 3. PAIN: "How bad is the pain?"    (Scale 1-10; or mild, moderate, severe)   -  MILD (1-3): doesn't interfere with normal activities    -  MODERATE (4-7): interferes with normal activities (e.g., work or school) or awakens from sleep, limping    -  SEVERE (8-10): excruciating pain, unable to do any normal activities, unable to walk     Pain # 8 when happening 4. WORK OR EXERCISE: "Has there been any recent work or exercise that involved this part of the body?"      Exercise walking on the [place] 5. CAUSE: "What do you think is causing the leg pain?"     cramping 6. OTHER SYMPTOMS: "Do you have any other symptoms?" (e.g., chest pain, back pain, breathing difficulty, swelling, rash, fever, numbness, weakness)     no 7. PREGNANCY: "Is there any chance you are pregnant?" "When was your last menstrual period?"     n/a  Protocols used: LEG PAIN-A-AH

## 2017-07-09 ENCOUNTER — Other Ambulatory Visit (INDEPENDENT_AMBULATORY_CARE_PROVIDER_SITE_OTHER): Payer: Medicare Other

## 2017-07-09 ENCOUNTER — Ambulatory Visit (INDEPENDENT_AMBULATORY_CARE_PROVIDER_SITE_OTHER): Payer: Medicare Other | Admitting: Internal Medicine

## 2017-07-09 ENCOUNTER — Encounter: Payer: Self-pay | Admitting: Internal Medicine

## 2017-07-09 ENCOUNTER — Ambulatory Visit (INDEPENDENT_AMBULATORY_CARE_PROVIDER_SITE_OTHER): Payer: Medicare Other | Admitting: General Practice

## 2017-07-09 VITALS — BP 130/86 | HR 73 | Temp 97.9°F | Ht 73.5 in | Wt 313.0 lb

## 2017-07-09 DIAGNOSIS — Z0000 Encounter for general adult medical examination without abnormal findings: Secondary | ICD-10-CM

## 2017-07-09 DIAGNOSIS — R739 Hyperglycemia, unspecified: Secondary | ICD-10-CM | POA: Diagnosis not present

## 2017-07-09 DIAGNOSIS — Z86718 Personal history of other venous thrombosis and embolism: Secondary | ICD-10-CM

## 2017-07-09 DIAGNOSIS — Z7901 Long term (current) use of anticoagulants: Secondary | ICD-10-CM

## 2017-07-09 DIAGNOSIS — Z23 Encounter for immunization: Secondary | ICD-10-CM | POA: Diagnosis not present

## 2017-07-09 LAB — BASIC METABOLIC PANEL
BUN: 23 mg/dL (ref 6–23)
CALCIUM: 9.3 mg/dL (ref 8.4–10.5)
CHLORIDE: 105 meq/L (ref 96–112)
CO2: 27 meq/L (ref 19–32)
Creatinine, Ser: 1.13 mg/dL (ref 0.40–1.50)
GFR: 83.15 mL/min (ref >60.00)
Glucose, Bld: 97 mg/dL (ref 70–99)
Potassium: 4.3 mEq/L (ref 3.5–5.1)
SODIUM: 139 meq/L (ref 135–145)

## 2017-07-09 LAB — CBC WITH DIFFERENTIAL/PLATELET
Basophils Absolute: 0.1 10*3/uL (ref 0.0–0.1)
Basophils Relative: 1 % (ref 0.0–3.0)
Eosinophils Absolute: 0.1 10*3/uL (ref 0.0–0.7)
Eosinophils Relative: 1.9 % (ref 0.0–5.0)
HCT: 49.1 % (ref 39.0–52.0)
Hemoglobin: 16.4 g/dL (ref 13.0–17.0)
LYMPHS ABS: 1.5 10*3/uL (ref 0.7–4.0)
Lymphocytes Relative: 27.9 % (ref 12.0–46.0)
MCHC: 33.4 g/dL (ref 30.0–36.0)
MCV: 98.6 fl (ref 78.0–100.0)
Monocytes Absolute: 0.7 10*3/uL (ref 0.1–1.0)
Monocytes Relative: 13.9 % — ABNORMAL HIGH (ref 3.0–12.0)
NEUTROS ABS: 2.9 10*3/uL (ref 1.4–7.7)
NEUTROS PCT: 55.3 % (ref 43.0–77.0)
PLATELETS: 169 10*3/uL (ref 150.0–400.0)
RBC: 4.98 Mil/uL (ref 4.22–5.81)
RDW: 15.7 % — ABNORMAL HIGH (ref 11.5–15.5)
WBC: 5.2 10*3/uL (ref 4.0–10.5)

## 2017-07-09 LAB — HEPATIC FUNCTION PANEL
ALT: 24 U/L (ref 0–53)
AST: 22 U/L (ref 0–37)
Albumin: 3.9 g/dL (ref 3.5–5.2)
Alkaline Phosphatase: 87 U/L (ref 39–117)
Bilirubin, Direct: 0.2 mg/dL (ref 0.0–0.3)
Total Bilirubin: 0.5 mg/dL (ref 0.2–1.2)
Total Protein: 7.5 g/dL (ref 6.0–8.3)

## 2017-07-09 LAB — LIPID PANEL
CHOL/HDL RATIO: 4
CHOLESTEROL: 174 mg/dL (ref 0–200)
HDL: 48.5 mg/dL (ref >39.00)
LDL CALC: 103 mg/dL — AB (ref 0–99)
NonHDL: 125.14
TRIGLYCERIDES: 113 mg/dL (ref 0.0–149.0)
VLDL: 22.6 mg/dL (ref 0.0–40.0)

## 2017-07-09 LAB — URINALYSIS, ROUTINE W REFLEX MICROSCOPIC
Bilirubin Urine: NEGATIVE
HGB URINE DIPSTICK: NEGATIVE
KETONES UR: NEGATIVE
LEUKOCYTES UA: NEGATIVE
NITRITE: NEGATIVE
RBC / HPF: NONE SEEN (ref 0–?)
Specific Gravity, Urine: 1.025 (ref 1.000–1.030)
TOTAL PROTEIN, URINE-UPE24: NEGATIVE
URINE GLUCOSE: NEGATIVE
UROBILINOGEN UA: 1 (ref 0.0–1.0)
pH: 5.5 (ref 5.0–8.0)

## 2017-07-09 LAB — PSA: PSA: 0.92 ng/mL (ref 0.10–4.00)

## 2017-07-09 LAB — POCT INR: INR: 2.6 (ref 2.0–3.0)

## 2017-07-09 LAB — HEMOGLOBIN A1C: Hgb A1c MFr Bld: 5.9 % (ref 4.6–6.5)

## 2017-07-09 LAB — TSH: TSH: 2.27 u[IU]/mL (ref 0.35–4.50)

## 2017-07-09 MED ORDER — ATORVASTATIN CALCIUM 40 MG PO TABS: 40.0000 mg | ORAL_TABLET | Freq: Every day | ORAL | 3 refills | Status: DC

## 2017-07-09 NOTE — Patient Instructions (Addendum)
Pre visit review using our clinic review tool, if applicable. No additional management support is needed unless otherwise documented below in the visit note.  Continue to take 1 1/2 tablets (7.5 mg) daily except 1 tablet (5 mg) on Mondays/Thursdays.  Re-check in 6 weeks at patient request.

## 2017-07-09 NOTE — Patient Instructions (Addendum)
You had the Pneumovax pneumonia shot today  Ok to stop the pravachol and change to lipitor 40 mg per day  Please continue all other medications as before, and refills have been done if requested.  Please have the pharmacy call with any other refills you may need.  Please continue your efforts at being more active, low cholesterol diet, and weight control.  You are otherwise up to date with prevention measures today.  Please keep your appointments with your specialists as you may have planned  Please go to the LAB in the Basement (turn left off the elevator) for the tests to be done today  You will be contacted by phone if any changes need to be made immediately.  Otherwise, you will receive a letter about your results with an explanation, but please check with MyChart first.  Please remember to sign up for MyChart if you have not done so, as this will be important to you in the future with finding out test results, communicating by private email, and scheduling acute appointments online when needed.  Please return in 1 year for your yearly visit, or sooner if needed, with Lab testing done 3-5 days before

## 2017-07-09 NOTE — Assessment & Plan Note (Signed)

## 2017-07-09 NOTE — Progress Notes (Signed)
Subjective:    Patient ID: [name], male    DOB: 1950-09-24, 67 y.o.   MRN: [id number]  HPI  Here for wellness and f/u;  Overall doing ok;  Pt denies Chest pain, worsening SOB, DOE, wheezing, orthopnea, PND, worsening LE edema, palpitations, dizziness or syncope.  Pt denies neurological change such as new headache, facial or extremity weakness.  Pt denies polydipsia, polyuria, or low sugar symptoms. Pt states overall good compliance with treatment and medications, good tolerability, and has been trying to follow appropriate diet.  Pt denies worsening depressive symptoms, suicidal ideation or panic. No fever, night sweats, wt loss, loss of appetite, or other constitutional symptoms.  Pt states good ability with ADL's, has low fall risk, home safety reviewed and adequate, no other significant changes in hearing or vision, and only occasionally active with exercise. Has recurring right foot callous, plans to see podiatry.  Trying to lose wt but not really working out.  No knee or back pain however, plans to start again at the gym Wt Readings from Last 3 Encounters:  07/09/17 (!) 313 lb (142 kg)  09/09/16 (!) 309 lb (140.2 kg)  08/14/16 (!) 310 lb (140.6 kg)   Past Medical History:  Diagnosis Date  . AAA (abdominal aortic aneurysm) [facility]) July 2013   Abdominal Aortic Endovascular Stent Graft 08/11/11. Bilateral. Right is 3.2 & the left is 3.6. "Preclose" repair bilateral common femoral artery. Placement of catheter in aorta x 2. Repair of aorta w/modular bifurcated prosthesis w/bilateral limbs.  Placement of right limb extension: 23 mm x 12 cm. Placement of left limb extension: 14 mm x 7 cm.  . Anemia    From GI bleed in 05/11/11.   [name] Chronic anticoagulation, PE DVT PAF   . Chronic pain   . Depression   . Dyslipidemia   . Edema of left lower extremity    S/P I&D 08/30/06  . GI bleed 04/2011   With anemia. EGD & colonoscopy 05/11/2011  . History of alcohol dependence ([place])   . HTN  (hypertension)   . Hyperlipemia   . Hypertension   . Iliac artery aneurysm, bilateral (HCC)    PVD, bilat common iliac aneurysms [443.9]  . Iliopsoas muscle hematoma 04/2011  . Lung nodule 2008  . Morbid obesity ([place])   . Morbid obesity with BMI of 40.0-44.9, adult ([place])   . Obstructive sleep apnea July 2013   Not wearing CPAP. Split-Night sleep study 06/03/11 AHI = 27.43/hr (TOTAL SLEEP TIME 105 minutes) and REM AHI = 120.00/hr  . Osteoarthritis of both knees    Bilateral Arthroscopy in 2000 & 2008. Both were complicated w/venous thromboembolism S/P IVC filter placement due to one being massive PE in 2008.  [name] PAF (paroxysmal atrial fibrillation)/atrial flutter 02/28/2010   ECHO : normal LV size, moderate concentricLVH, normal EF, normal atrial sizes, RV was normal but previously dilated;;  Myoview : Normal perfusion study. Low risk scan.  [name] PAH (pulmonary artery hypertension) ([place])   . Prolonged QT interval   . Psoriasis   . Pulmonary embolism ([place]) 2008   DVT --> Massive PE with right-sided strain. On coumadin. ECHO 02/28/10 showed normal LV size, moderate concentricLVH, normal EF, normal atrial sizes, RV was normal but previously dilated.   Past Surgical History:  Procedure Laterality Date  . ABDOMINAL AORTAGRAM N/A 05/14/2011   Procedure: ABDOMINAL [name];  Surgeon: Conrad McCook, MD;  Location: [facility] CATH LAB;  Service: Cardiovascular;  Laterality: N/A;  . Abdominal Aortic  Evar  08/11/11   Bilateral. Right is 3.2 & the left is 3.6. "Preclose" repair bilateral common femoral artery. Placement of catheter in aorta x 2. Repair of aorta w/modular bifurcated prosthesis w/bilateral limbs.  Placement of right limb extension: 23 mm x 12 cm. Placement of left limb extension: 14 mm x 7 cm.  . ACHILLES TENDON SURGERY    . COLONOSCOPY  05/11/2011   For GI bleed. Performed by Dr. Juanita Craver, MD. Also had a EGD performed at this time.  . COLONOSCOPY  05/12/2011  . EMBOLIZATION N/A 07/30/2011    Procedure: EMBOLIZATION;  Surgeon: Conrad Strafford, MD;  Location[facility] C[facility] LAB;  Service: Cardiovascular;  Laterality: N/A;  . ESOPHAGOGASTRODUODENOSCOPY  05/08/2011   For GI bleed/Anemia. Also had a colonoscopy at this time.  . INSERTION OF VENA CAVA FILTER N/A 05/14/2011   Procedure: INSERTION OF VENA CAVA FILTER;  Surgeon: Conrad Manning, MD;  Location: [facility] CATH LAB;  Service: Cardiovascular;  Laterality: N/A;  . KNEE ARTHROSCOPY Bilateral 2000 & 2008   DJD knees. Both were complicated w/venous thromboembolism S/P IVC filter placement due to one being massive PE in 2008.  [name] KNEE SURGERY  1999  . LOWER EXTREMITY ANGIOGRAM N/A 05/14/2011   Procedure: LOWER EXTREMITY ANGIOGRAM;  Surgeon: Conrad Taliaferro, MD;  Location: [facility] CATH LAB;  Service: Cardiovascular;  Laterality: N/A;    reports that he quit smoking about 11 years ago. He has a 1.00 pack-year smoking history. He has never used smokeless tobacco. He reports that he drinks about 0.6 - 1.2 oz of alcohol per week. He reports that he has current or past drug history. family history includes Arthritis in his father and mother; CAD (age of onset: 44) in his father; Cancer in his brother and mother. Allergies  Allergen Reactions  . Crestor [Rosuvastatin] Swelling   Current Outpatient Medications on File Prior to Visit  Medication Sig Dispense Refill  . allopurinol (ZYLOPRIM) 100 MG tablet TAKE 1 TABLET BY MOUTH  DAILY 90 tablet 0  . clotrimazole (LOTRIMIN) 1 % cream Apply topically 2 (two) times daily. 30 g 1  . desonide (DESOWEN) 0.05 % cream Apply 1 application topically daily. Apply to rash on face    . digoxin (LANOXIN) 0.125 MG tablet TAKE 1 TABLET BY MOUTH  DAILY 90 tablet 0  . diltiazem (TIAZAC) 360 MG 24 hr capsule TAKE 1 CAPSULE BY MOUTH  DAILY 90 capsule 0  . fluocinonide ointment (LIDEX) 4.69 % Apply 1 application topically 2 (two) times daily. Apply to dry skin areas on body    . fluocinonide-emollient (LIDEX-E) 0.05 % cream Apply 1 application  topically 2 (two) times daily. 454 g 0  . ketoconazole (NIZORAL) 200 MG tablet Take 1 tablet (200 mg total) by mouth daily. 7 tablet 0  . metoprolol tartrate (LOPRESSOR) 50 MG tablet TAKE 1 TABLET BY MOUTH TWO  TIMES DAILY 180 tablet 0  . triamcinolone ointment (KENALOG) 0.1 % Apply 1 application topically daily. Apply to skin rash areas on body 454 g 0  . VIAGRA 100 MG tablet Take 0.5-1 tablets (50-100 mg total) by mouth daily as needed for erectile dysfunction. 5 tablet 11  . warfarin (COUMADIN) 5 MG tablet TAKE AS DIRECTED BY  COUMADIN CLINIC 120 tablet 0   No current facility-administered medications on file prior to visit.    Review of Systems Constitutional: Negative for other unusual diaphoresis, sweats, appetite or weight changes HENT: Negative for other worsening hearing loss, ear pain,  facial swelling, mouth sores or neck stiffness.   Eyes: Negative for other worsening pain, redness or other visual disturbance.  Respiratory: Negative for other stridor or swelling Cardiovascular: Negative for other palpitations or other chest pain  Gastrointestinal: Negative for worsening diarrhea or loose stools, blood in stool, distention or other pain Genitourinary: Negative for hematuria, flank pain or other change in urine volume.  Musculoskeletal: Negative for myalgias or other joint swelling.  Skin: Negative for other color change, or other wound or worsening drainage.  Neurological: Negative for other syncope or numbness. Hematological: Negative for other adenopathy or swelling Psychiatric/Behavioral: Negative for hallucinations, other worsening agitation, SI, self-injury, or new decreased concentration All other system neg per pt    Objective:   Physical Exam BP 130/86   Pulse 73   Temp 97.9 F (36.6 C) (Oral)   Ht 6' 1.5" (1.867 m)   Wt (!) 313 lb (142 kg)   SpO2 98%   BMI 40.74 kg/m  VS noted, morbid obese, somewhat disheveled Constitutional: Pt is oriented to person, place,  and time. Appears well-developed and well-nourished, in no significant distress and comfortable Head: Normocephalic and atraumatic  Eyes: Conjunctivae and EOM are normal. Pupils are equal, round, and reactive to light Right Ear: External ear normal without discharge Left Ear: External ear normal without discharge Nose: Nose without discharge or deformity Mouth/Throat: Oropharynx is without other ulcerations and moist  Neck: Normal range of motion. Neck supple. No JVD present. No tracheal deviation present or significant neck LA or mass Cardiovascular: Normal rate, regular rhythm, normal heart sounds and intact distal pulses Pulmonary/Chest: WOB normal and breath sounds without rales or wheezing  Abdominal: Soft. Bowel sounds are normal. NT. No HSM  Musculoskeletal: Normal range of motion. Exhibits no edema Lymphadenopathy: Has no other cervical adenopathy.  Neurological: Pt is alert and oriented to person, place, and time. Pt has normal reflexes. No cranial nerve deficit. Motor grossly intact, Gait intact Skin: Skin is warm and dry. No rash noted or new ulcerations Psychiatric:  Has normal mood and affect. Behavior is normal without agitation No other exam findings Lab Results  Component Value Date   WBC 6.4 02/05/2016   HGB 16.4 02/05/2016   HCT 48.3 02/05/2016   PLT 172.0 02/05/2016   GLUCOSE 95 02/05/2016   CHOL 162 02/05/2016   TRIG 105.0 02/05/2016   HDL 44.10 02/05/2016   LDLCALC 97 02/05/2016   ALT 26 02/05/2016   AST 23 02/05/2016   NA 141 02/05/2016   K 4.1 02/05/2016   CL 107 02/05/2016   CREATININE 1.04 02/05/2016   BUN 23 02/05/2016   CO2 29 02/05/2016   TSH 1.64 02/05/2016   PSA 0.51 02/05/2016   INR 3.4 05/28/2017   HGBA1C 5.8 02/05/2016       Assessment & Plan:

## 2017-07-09 NOTE — Assessment & Plan Note (Signed)
stable overall by history and exam, recent data reviewed with pt, and pt to continue medical treatment as before,  to f/u any worsening symptoms or concerns Lab Results  Component Value Date   HGBA1C 5.8 02/05/2016

## 2017-07-15 ENCOUNTER — Other Ambulatory Visit: Payer: Self-pay | Admitting: Internal Medicine

## 2017-07-19 ENCOUNTER — Other Ambulatory Visit: Payer: Self-pay | Admitting: Internal Medicine

## 2017-07-19 ENCOUNTER — Other Ambulatory Visit: Payer: Self-pay | Admitting: General Practice

## 2017-07-19 MED ORDER — WARFARIN SODIUM 5 MG PO TABS: ORAL_TABLET | ORAL | 0 refills | Status: DC

## 2017-08-20 ENCOUNTER — Ambulatory Visit (INDEPENDENT_AMBULATORY_CARE_PROVIDER_SITE_OTHER): Payer: Medicare Other | Admitting: General Practice

## 2017-08-20 DIAGNOSIS — Z7901 Long term (current) use of anticoagulants: Secondary | ICD-10-CM | POA: Diagnosis not present

## 2017-08-20 DIAGNOSIS — Z86718 Personal history of other venous thrombosis and embolism: Secondary | ICD-10-CM

## 2017-08-20 LAB — POCT INR: INR: 2.8 (ref 2.0–3.0)

## 2017-08-20 NOTE — Patient Instructions (Addendum)
Pre visit review using our clinic review tool, if applicable. No additional management support is needed unless otherwise documented below in the visit note.  Continue to take 1 1/2 tablets (7.5 mg) daily except 1 tablet (5 mg) on Mondays/Thursdays.  Re-check in 6 weeks at patient request.

## 2017-09-03 ENCOUNTER — Ambulatory Visit: Payer: Medicare Other

## 2017-09-29 ENCOUNTER — Other Ambulatory Visit: Payer: Self-pay | Admitting: Internal Medicine

## 2017-10-01 ENCOUNTER — Ambulatory Visit (INDEPENDENT_AMBULATORY_CARE_PROVIDER_SITE_OTHER): Payer: Medicare Other | Admitting: General Practice

## 2017-10-01 DIAGNOSIS — Z7901 Long term (current) use of anticoagulants: Secondary | ICD-10-CM

## 2017-10-01 DIAGNOSIS — Z23 Encounter for immunization: Secondary | ICD-10-CM | POA: Diagnosis not present

## 2017-10-01 DIAGNOSIS — Z86718 Personal history of other venous thrombosis and embolism: Secondary | ICD-10-CM

## 2017-10-01 LAB — POCT INR: INR: 2.8 (ref 2.0–3.0)

## 2017-10-01 NOTE — Patient Instructions (Addendum)
Pre visit review using our clinic review tool, if applicable. No additional management support is needed unless otherwise documented below in the visit note.  Continue to take 1 1/2 tablets (7.5 mg) daily except 1 tablet (5 mg) on Mondays/Thursdays.  Re-check in 6 weeks at patient request.

## 2017-10-06 ENCOUNTER — Telehealth: Payer: Self-pay | Admitting: Internal Medicine

## 2017-10-06 MED ORDER — DILTIAZEM HCL ER BEADS 360 MG PO CP24: 360.0000 mg | ORAL_CAPSULE | Freq: Every day | ORAL | 0 refills | Status: DC

## 2017-10-06 NOTE — Telephone Encounter (Signed)
Copied from [place] [id number]. Topic: Quick Communication - Rx Refill/Question >> Oct 06, 2017  9:53 AM [name] wrote: Medication:  diltiazem (TIAZAC) 360 MG 24 hr capsule Pt states [facility] called him and advised they could not get in touch with his dr for refill request, for him to call.  [address] [place], [place] [phone] (Phone) [phone] (Fax)

## 2017-10-06 NOTE — Telephone Encounter (Signed)
Diltiazem 360 mg 24 hr tab refill Last Refill:05/04/17 # 90 Last OV: 07/09/17 PCP: [place]: [facility]

## 2017-11-12 ENCOUNTER — Ambulatory Visit (INDEPENDENT_AMBULATORY_CARE_PROVIDER_SITE_OTHER): Payer: Medicare Other | Admitting: General Practice

## 2017-11-12 DIAGNOSIS — Z86718 Personal history of other venous thrombosis and embolism: Secondary | ICD-10-CM

## 2017-11-12 DIAGNOSIS — Z7901 Long term (current) use of anticoagulants: Secondary | ICD-10-CM | POA: Diagnosis not present

## 2017-11-12 DIAGNOSIS — I2699 Other pulmonary embolism without acute cor pulmonale: Secondary | ICD-10-CM

## 2017-11-12 LAB — POCT INR: INR: 3.3 — AB (ref 2.0–3.0)

## 2017-11-12 NOTE — Progress Notes (Signed)
Agree with management.  Edward Dworkin J Dreama Kuna, MD  

## 2017-11-12 NOTE — Patient Instructions (Addendum)
Pre visit review using our clinic review tool, if applicable. No additional management support is needed unless otherwise documented below in the visit note.  Skip dose today and then continue to take 1 1/2 tablets (7.5 mg) daily except 1 tablet (5 mg) on Mondays/Thursdays.  Re-check in 6 weeks at patient request.

## 2017-11-20 ENCOUNTER — Other Ambulatory Visit: Payer: Self-pay | Admitting: Internal Medicine

## 2017-11-24 ENCOUNTER — Other Ambulatory Visit: Payer: Self-pay | Admitting: Internal Medicine

## 2017-12-07 ENCOUNTER — Encounter: Payer: Self-pay | Admitting: Internal Medicine

## 2017-12-07 ENCOUNTER — Other Ambulatory Visit: Payer: Medicare Other

## 2017-12-07 ENCOUNTER — Ambulatory Visit (INDEPENDENT_AMBULATORY_CARE_PROVIDER_SITE_OTHER): Payer: Medicare Other | Admitting: Internal Medicine

## 2017-12-07 VITALS — BP 124/86 | HR 65 | Temp 98.4°F | Ht 73.5 in | Wt 330.0 lb

## 2017-12-07 DIAGNOSIS — I10 Essential (primary) hypertension: Secondary | ICD-10-CM | POA: Diagnosis not present

## 2017-12-07 DIAGNOSIS — M79671 Pain in right foot: Secondary | ICD-10-CM

## 2017-12-07 DIAGNOSIS — R21 Rash and other nonspecific skin eruption: Secondary | ICD-10-CM | POA: Diagnosis not present

## 2017-12-07 DIAGNOSIS — L409 Psoriasis, unspecified: Secondary | ICD-10-CM | POA: Diagnosis not present

## 2017-12-07 DIAGNOSIS — R399 Unspecified symptoms and signs involving the genitourinary system: Secondary | ICD-10-CM

## 2017-12-07 DIAGNOSIS — R300 Dysuria: Secondary | ICD-10-CM | POA: Diagnosis not present

## 2017-12-07 MED ORDER — TRIAMCINOLONE ACETONIDE 0.5 % EX OINT: 1.0000 "application " | TOPICAL_OINTMENT | Freq: Two times a day (BID) | CUTANEOUS | 1 refills | Status: DC

## 2017-12-07 MED ORDER — NYSTATIN 100000 UNIT/GM EX POWD: CUTANEOUS | 3 refills | Status: DC

## 2017-12-07 NOTE — Patient Instructions (Signed)
OK to increase the cream to the 0.5%  You will be contacted regarding the referral for: Dermatology, and Podiatry (foot doctor)  Please take all new medication as prescribed - the nystatin powder for the skin symptoms  Please continue all other medications as before, and refills have been done if requested.  Please have the pharmacy call with any other refills you may need.  Please continue your efforts at being more active, low cholesterol diet, and weight control.  Please keep your appointments with your specialists as you may have planned  OK to bring the urine sample back in the AM

## 2017-12-07 NOTE — Progress Notes (Signed)
Subjective:    Patient ID: [name], male    DOB: [birth year], 67 y.o.   MRN: [id number]  HPI  Here to f/u; overall doing ok,  Pt denies chest pain, increasing sob or doe, wheezing, orthopnea, PND, increased LE swelling, palpitations, dizziness or syncope.  Pt denies new neurological symptoms such as new headache, or facial or extremity weakness or numbness.  Pt denies polydipsia, polyuria, or low sugar episode.  Pt states overall good compliance with meds, mostly trying to follow appropriate diet, with wt overall stable,  but little exercise however.  Unable to give urine specimen today, but c/o dysuria mild intermittent for several wks despite Denies urinary symptoms such as frequency, urgency, flank pain, hematuria or n/v, fever, chills.  Asks for increased strength of triam Cr to 0.5% as needs this to better control psoriasis.  Also has marked itchy nontender but mild erythem rash to lower abd under the pannus.  Also has right foot arch pain mostly to the instep despite use of prior orthotics, without recent trauma or swelling.   Past Medical History:  Diagnosis Date  . AAA (abdominal aortic aneurysm) [facility]) July 2013   Abdominal Aortic Endovascular Stent Graft 08/11/11. Bilateral. Right is 3.2 & the left is 3.6. "Preclose" repair bilateral common femoral artery. Placement of catheter in aorta x 2. Repair of aorta w/modular bifurcated prosthesis w/bilateral limbs.  Placement of right limb extension: 23 mm x 12 cm. Placement of left limb extension: 14 mm x 7 cm.  . Anemia    From GI bleed in 05/11/11.   [name] Chronic anticoagulation, PE DVT PAF   . Chronic pain   . Depression   . Dyslipidemia   . Edema of left lower extremity    S/P I&D 08/30/06  . GI bleed 04/2011   With anemia. EGD & colonoscopy 05/11/2011  . History of alcohol dependence ([place])   . HTN (hypertension)   . Hyperlipemia   . Hypertension   . Iliac artery aneurysm, bilateral (HCC)    PVD, bilat common iliac aneurysms [443.9]  .  Iliopsoas muscle hematoma 04/2011  . Lung nodule 2008  . Morbid obesity ([place])   . Morbid obesity with BMI of 40.0-44.9, adult ([place])   . Obstructive sleep apnea July 2013   Not wearing CPAP. Split-Night sleep study 06/03/11 AHI = 27.43/hr (TOTAL SLEEP TIME 105 minutes) and REM AHI = 120.00/hr  . Osteoarthritis of both knees    Bilateral Arthroscopy in 2000 & 2008. Both were complicated w/venous thromboembolism S/P IVC filter placement due to one being massive PE in 2008.  [name] PAF (paroxysmal atrial fibrillation)/atrial flutter 02/28/2010   ECHO : normal LV size, moderate concentricLVH, normal EF, normal atrial sizes, RV was normal but previously dilated;;  Myoview : Normal perfusion study. Low risk scan.  [name] PAH (pulmonary artery hypertension) ([place])   . Prolonged QT interval   . Psoriasis   . Pulmonary embolism ([place]) 2008   DVT --> Massive PE with right-sided strain. On coumadin. ECHO 02/28/10 showed normal LV size, moderate concentricLVH, normal EF, normal atrial sizes, RV was normal but previously dilated.   Past Surgical History:  Procedure Laterality Date  . ABDOMINAL AORTAGRAM N/A 05/14/2011   Procedure: ABDOMINAL [name];  Surgeon: Conrad [place], MD;  Location: [facility] CATH LAB;  Service: Cardiovascular;  Laterality: N/A;  . Abdominal Aortic Evar  08/11/11   Bilateral. Right is 3.2 & the left is 3.6. "Preclose" repair bilateral common femoral artery. Placement of catheter  in aorta x 2. Repair of aorta w/modular bifurcated prosthesis w/bilateral limbs.  Placement of right limb extension: 23 mm x 12 cm. Placement of left limb extension: 14 mm x 7 cm.  . ACHILLES TENDON SURGERY    . COLONOSCOPY  05/11/2011   For GI bleed. Performed by Dr. Juanita Craver, MD. Also had a EGD performed at this time.  . COLONOSCOPY  05/12/2011  . EMBOLIZATION N/A 07/30/2011   Procedure: EMBOLIZATION;  Surgeon: Conrad [place], MD;  Location: [facility] CATH LAB;  Service: Cardiovascular;  Laterality: N/A;  . ESOPHAGOGASTRODUODENOSCOPY   05/08/2011   For GI bleed/Anemia. Also had a colonoscopy at this time.  . INSERTION OF VENA CAVA FILTER N/A 05/14/2011   Procedure: INSERTION OF VENA CAVA FILTER;  Surgeon: Conrad [place], MD;  Location: [facility] CATH LAB;  Service: Cardiovascular;  Laterality: N/A;  . KNEE ARTHROSCOPY Bilateral 2000 & 2008   DJD knees. Both were complicated w/venous thromboembolism S/P IVC filter placement due to one being massive PE in 2008.  [name] KNEE SURGERY  1999  . LOWER EXTREMITY ANGIOGRAM N/A 05/14/2011   Procedure: LOWER EXTREMITY ANGIOGRAM;  Surgeon: Conrad [place], MD;  Location: [facility] CATH LAB;  Service: Cardiovascular;  Laterality: N/A;    reports that he quit smoking about 11 years ago. He has a 1.00 pack-year smoking history. He has never used smokeless tobacco. He reports that he drinks about 1.0 - 2.0 standard drinks of alcohol per week. He reports that he has current or past drug history. family history includes Arthritis in his father and mother; CAD (age of onset: 45) in his father; Cancer in his brother and mother. Allergies  Allergen Reactions  . Crestor [Rosuvastatin] Swelling   Current Outpatient Medications on File Prior to Visit  Medication Sig Dispense Refill  . allopurinol (ZYLOPRIM) 100 MG tablet TAKE 1 TABLET BY MOUTH  DAILY 90 tablet 1  . atorvastatin (LIPITOR) 40 MG tablet Take 1 tablet (40 mg total) by mouth daily. 90 tablet 3  . clotrimazole (LOTRIMIN) 1 % cream Apply topically 2 (two) times daily. 30 g 1  . desonide (DESOWEN) 0.05 % cream Apply 1 application topically daily. Apply to rash on face    . digoxin (LANOXIN) 0.125 MG tablet TAKE 1 TABLET BY MOUTH  DAILY 90 tablet 1  . diltiazem (TIAZAC) 360 MG 24 hr capsule TAKE 1 CAPSULE BY MOUTH  DAILY 90 capsule 0  . fluocinonide ointment (LIDEX) 9.62 % Apply 1 application topically 2 (two) times daily. Apply to dry skin areas on body    . fluocinonide-emollient (LIDEX-E) 0.05 % cream Apply 1 application topically 2 (two) times daily. 454 g 0  .  ketoconazole (NIZORAL) 200 MG tablet Take 1 tablet (200 mg total) by mouth daily. 7 tablet 0  . metoprolol tartrate (LOPRESSOR) 50 MG tablet TAKE 1 TABLET BY MOUTH TWO  TIMES DAILY 180 tablet 1  . VIAGRA 100 MG tablet Take 0.5-1 tablets (50-100 mg total) by mouth daily as needed for erectile dysfunction. 5 tablet 11  . warfarin (COUMADIN) 5 MG tablet Take 1 1/2 tablets daily except 1 tablet on Mon and Thurs or TAKE AS DIRECTED BY  COUMADIN CLINIC 120 tablet 0   No current facility-administered medications on file prior to visit.    Review of Systems  Constitutional: Negative for other unusual diaphoresis or sweats HENT: Negative for ear discharge or swelling Eyes: Negative for other worsening visual disturbances Respiratory: Negative for stridor or other swelling  Gastrointestinal:  Negative for worsening distension or other blood Genitourinary: Negative for retention or other urinary change Musculoskeletal: Negative for other MSK pain or swelling Skin: Negative for color change or other new lesions Neurological: Negative for worsening tremors and other numbness  Psychiatric/Behavioral: Negative for worsening agitation or other fatigue All other system neg per pt    Objective:   Physical Exam BP 124/86   Pulse 65   Temp 98.4 F (36.9 C) (Oral)   Ht 6' 1.5" (1.867 m)   Wt (!) 330 lb (149.7 kg)   SpO2 96%   BMI 42.95 kg/m  VS noted,  Constitutional: Pt appears in NAD HENT: Head: NCAT.  Right Ear: External ear normal.  Left Ear: External ear normal.  Eyes: . Pupils are equal, round, and reactive to light. Conjunctivae and EOM are normal Nose: without d/c or deformity Neck: Neck supple. Gross normal ROM Cardiovascular: Normal rate and regular rhythm.   Pulmonary/Chest: Effort normal and breath sounds without rales or wheezing.  Abd:  Soft, NT, ND, + BS, no organomegaly Neurological: Pt is alert. At baseline orientation, motor grossly intact Skin: Skin is warm. + elbow psoriatic  rashes as well as nontender erythem rash large area under pannus involving bilat groin areas, no other new lesions, no LE edema Psychiatric: Pt behavior is normal without agitation  Right foot arch without swelling but mild tender Lab Results  Component Value Date   WBC 5.2 07/09/2017   HGB 16.4 07/09/2017   HCT 49.1 07/09/2017   PLT 169.0 07/09/2017   GLUCOSE 97 07/09/2017   CHOL 174 07/09/2017   TRIG 113.0 07/09/2017   HDL 48.50 07/09/2017   LDLCALC 103 (H) 07/09/2017   ALT 24 07/09/2017   AST 22 07/09/2017   NA 139 07/09/2017   K 4.3 07/09/2017   CL 105 07/09/2017   CREATININE 1.13 07/09/2017   BUN 23 07/09/2017   CO2 27 07/09/2017   TSH 2.27 07/09/2017   PSA 0.92 07/09/2017   INR 3.3 (A) 11/12/2017   HGBA1C 5.9 07/09/2017      Assessment & Plan:

## 2017-12-08 ENCOUNTER — Other Ambulatory Visit (INDEPENDENT_AMBULATORY_CARE_PROVIDER_SITE_OTHER): Payer: Medicare Other

## 2017-12-08 ENCOUNTER — Encounter: Payer: Self-pay | Admitting: Internal Medicine

## 2017-12-08 DIAGNOSIS — R399 Unspecified symptoms and signs involving the genitourinary system: Secondary | ICD-10-CM | POA: Diagnosis not present

## 2017-12-08 LAB — URINALYSIS, ROUTINE W REFLEX MICROSCOPIC
Bilirubin Urine: NEGATIVE
HGB URINE DIPSTICK: NEGATIVE
KETONES UR: NEGATIVE
Leukocytes, UA: NEGATIVE
Nitrite: NEGATIVE
RBC / HPF: NONE SEEN (ref 0–?)
SPECIFIC GRAVITY, URINE: 1.01 (ref 1.000–1.030)
Total Protein, Urine: NEGATIVE
UROBILINOGEN UA: 0.2 (ref 0.0–1.0)
Urine Glucose: NEGATIVE
pH: 6 (ref 5.0–8.0)

## 2017-12-09 DIAGNOSIS — M79671 Pain in right foot: Secondary | ICD-10-CM | POA: Insufficient documentation

## 2017-12-09 NOTE — Assessment & Plan Note (Signed)
stable overall by history and exam, recent data reviewed with pt, and pt to continue medical treatment as before,  to f/u any worsening symptoms or concerns  

## 2017-12-09 NOTE — Assessment & Plan Note (Signed)
For UA, not clear if infectious related,  to f/u any worsening symptoms or concerns

## 2017-12-09 NOTE — Assessment & Plan Note (Signed)
C/w likely fungal, for nystatin powder asd,  to f/u any worsening symptoms or concerns

## 2017-12-09 NOTE — Assessment & Plan Note (Signed)
[place] for increased triam cr 0.5% asd,  to f/u any worsening symptoms or concerns

## 2017-12-09 NOTE — Assessment & Plan Note (Signed)
?   Plantar fasciitis vs other - for podiatry referral

## 2017-12-10 LAB — URINE CULTURE
MICRO NUMBER: 91430718
Result:: NO GROWTH
SPECIMEN QUALITY:: ADEQUATE

## 2017-12-17 ENCOUNTER — Ambulatory Visit (INDEPENDENT_AMBULATORY_CARE_PROVIDER_SITE_OTHER): Payer: Medicare Other | Admitting: General Practice

## 2017-12-17 DIAGNOSIS — Z86718 Personal history of other venous thrombosis and embolism: Secondary | ICD-10-CM

## 2017-12-17 DIAGNOSIS — Z7901 Long term (current) use of anticoagulants: Secondary | ICD-10-CM | POA: Diagnosis not present

## 2017-12-17 LAB — POCT INR: INR: 3.6 — AB (ref 2.0–3.0)

## 2017-12-17 NOTE — Patient Instructions (Addendum)
Pre visit review using our clinic review tool, if applicable. No additional management support is needed unless otherwise documented below in the visit note.  Skip dose today and then change dosage and take 1 1/2 tablets (7.5 mg) daily except 1 tablet (5 mg) on Mondays/Thursdays/Saturdays.  Re-check in 6 weeks at patient request.

## 2018-01-03 ENCOUNTER — Other Ambulatory Visit: Payer: Self-pay | Admitting: Internal Medicine

## 2018-01-17 ENCOUNTER — Encounter: Payer: Self-pay | Admitting: Podiatry

## 2018-01-17 ENCOUNTER — Ambulatory Visit: Payer: Medicare Other | Admitting: Podiatry

## 2018-01-17 VITALS — BP 153/96 | HR 64 | Resp 16

## 2018-01-17 DIAGNOSIS — M779 Enthesopathy, unspecified: Secondary | ICD-10-CM | POA: Diagnosis not present

## 2018-01-17 DIAGNOSIS — L84 Corns and callosities: Secondary | ICD-10-CM | POA: Diagnosis not present

## 2018-01-17 DIAGNOSIS — D689 Coagulation defect, unspecified: Secondary | ICD-10-CM

## 2018-01-17 MED ORDER — TRIAMCINOLONE ACETONIDE 10 MG/ML IJ SUSP
10.0000 mg | Freq: Once | INTRAMUSCULAR | Status: AC
  Administered 2018-01-17: 10 mg

## 2018-01-17 NOTE — Progress Notes (Signed)
   Subjective:    Patient ID: [name], male    DOB: 08/14/1950, 68 y.o.   MRN: [id number]  HPI    Review of Systems  All other systems reviewed and are negative.      Objective:   Physical Exam        Assessment & Plan:

## 2018-01-21 ENCOUNTER — Ambulatory Visit (INDEPENDENT_AMBULATORY_CARE_PROVIDER_SITE_OTHER): Payer: Medicare Other | Admitting: General Practice

## 2018-01-21 DIAGNOSIS — L400 Psoriasis vulgaris: Secondary | ICD-10-CM | POA: Diagnosis not present

## 2018-01-21 DIAGNOSIS — Z86718 Personal history of other venous thrombosis and embolism: Secondary | ICD-10-CM

## 2018-01-21 DIAGNOSIS — Z7901 Long term (current) use of anticoagulants: Secondary | ICD-10-CM

## 2018-01-21 LAB — POCT INR: INR: 2.6 (ref 2.0–3.0)

## 2018-01-21 NOTE — Patient Instructions (Addendum)
Pre visit review using our clinic review tool, if applicable. No additional management support is needed unless otherwise documented below in the visit note.  Continue to take 1 1/2 tablets (7.5 mg) daily except 1 tablet (5 mg) on Mondays/Thursdays/Saturdays.  Re-check in 6 weeks at patient request.

## 2018-01-21 NOTE — Progress Notes (Signed)
Subjective:   Patient ID: [name], male   DOB: 68 y.o.   MRN: [id number]   HPI Patient presents stating his had a lesion on the plantar aspect of his right foot for a year and a half and has had a cut out previously which helped short-term.  He states that it is very sore to walk on and he feels like there is inflammation in his plantar foot and he is obese and is on blood thinner.  Patient does not smoke currently and would like to be more active   Review of Systems  All other systems reviewed and are negative.       Objective:  Physical Exam Vitals signs and nursing note reviewed.  Constitutional:      Appearance: He is well-developed.  Pulmonary:     Effort: Pulmonary effort is normal.  Musculoskeletal: Normal range of motion.  Skin:    General: Skin is warm.  Neurological:     Mental Status: He is alert.     Neurovascular status intact muscle strength is adequate with inflammation pain of the insertion of the plantar fascia into the first metatarsal head right with fluid buildup at this area and there is also noted to be a keratotic lesion with a lucent type core which measures approximately 6 x 6 mm and is very tender when pressed.  Patient does have good digital perfusion and again is obese with a lot of stress on the bottom of his foot     Assessment:  Distal fasciitis-like symptoms right along with keratotic lesion that is probably porokeratotic which is at high risk for him due to his blood thinner he takes     Plan:  H&P condition reviewed and at this point I did a plantar injection after sterile prep of the distal fascia and I then went ahead after appropriate numbness I debrided the lesion fully applied medication to try to reduce stress on it with padding and I will see him back when symptomatic again

## 2018-03-04 ENCOUNTER — Ambulatory Visit: Payer: Medicare Other

## 2018-03-06 ENCOUNTER — Other Ambulatory Visit: Payer: Self-pay | Admitting: Internal Medicine

## 2018-03-11 ENCOUNTER — Ambulatory Visit: Payer: Medicare Other

## 2018-03-18 ENCOUNTER — Ambulatory Visit (INDEPENDENT_AMBULATORY_CARE_PROVIDER_SITE_OTHER): Payer: Medicare Other | Admitting: General Practice

## 2018-03-18 DIAGNOSIS — Z7901 Long term (current) use of anticoagulants: Secondary | ICD-10-CM

## 2018-03-18 DIAGNOSIS — Z86718 Personal history of other venous thrombosis and embolism: Secondary | ICD-10-CM

## 2018-03-18 LAB — POCT INR: INR: 2.4 (ref 2.0–3.0)

## 2018-03-18 NOTE — Patient Instructions (Addendum)
Pre visit review using our clinic review tool, if applicable. No additional management support is needed unless otherwise documented below in the visit note.  Continue to take 1 1/2 tablets (7.5 mg) daily except 1 tablet (5 mg) on Mondays/Thursdays/Saturdays.  Re-check in 6 weeks at patient request.

## 2018-03-29 ENCOUNTER — Other Ambulatory Visit: Payer: Self-pay | Admitting: Internal Medicine

## 2018-04-29 ENCOUNTER — Ambulatory Visit: Payer: Medicare Other

## 2018-05-13 ENCOUNTER — Ambulatory Visit (INDEPENDENT_AMBULATORY_CARE_PROVIDER_SITE_OTHER): Payer: Medicare Other | Admitting: General Practice

## 2018-05-13 DIAGNOSIS — Z7901 Long term (current) use of anticoagulants: Secondary | ICD-10-CM

## 2018-05-13 DIAGNOSIS — Z86718 Personal history of other venous thrombosis and embolism: Secondary | ICD-10-CM

## 2018-05-13 LAB — POCT INR: INR: 3.4 — AB (ref 2.0–3.0)

## 2018-05-13 NOTE — Patient Instructions (Addendum)
Pre visit review using our clinic review tool, if applicable. No additional management support is needed unless otherwise documented below in the visit note.  Hold dosage today and then continue to take 1 1/2 tablets (7.5 mg) daily except 1 tablet (5 mg) on Mondays/Thursdays/Saturdays.  Re-check in 4 weeks.

## 2018-06-10 ENCOUNTER — Ambulatory Visit: Payer: Medicare Other

## 2018-07-15 ENCOUNTER — Telehealth: Payer: Self-pay | Admitting: Internal Medicine

## 2018-07-15 NOTE — Telephone Encounter (Signed)
Patient is calling to reschedule appt on Tuesday 07/19/2018 Please advise [phone]

## 2018-07-18 NOTE — Telephone Encounter (Signed)
LVM for patient to call back to reschedule.

## 2018-07-19 ENCOUNTER — Encounter: Payer: Medicare Other | Admitting: Internal Medicine

## 2018-07-19 DIAGNOSIS — Z0289 Encounter for other administrative examinations: Secondary | ICD-10-CM

## 2018-07-22 ENCOUNTER — Other Ambulatory Visit: Payer: Self-pay | Admitting: Internal Medicine

## 2018-07-22 MED ORDER — DIGOXIN 125 MCG PO TABS: 125.0000 ug | ORAL_TABLET | Freq: Every day | ORAL | 0 refills | Status: DC

## 2018-07-22 MED ORDER — METOPROLOL TARTRATE 50 MG PO TABS: 50.0000 mg | ORAL_TABLET | Freq: Two times a day (BID) | ORAL | 0 refills | Status: DC

## 2018-07-22 MED ORDER — ATORVASTATIN CALCIUM 40 MG PO TABS: 40.0000 mg | ORAL_TABLET | Freq: Every day | ORAL | 0 refills | Status: DC

## 2018-07-22 MED ORDER — ALLOPURINOL 100 MG PO TABS: 100.0000 mg | ORAL_TABLET | Freq: Every day | ORAL | 0 refills | Status: DC

## 2018-07-22 NOTE — Telephone Encounter (Signed)
warfarin (COUMADIN) 5 MG tablet [[id number]  diltiazem (TIAZAC) 360 MG 24 hr capsule [[id number]]   atorvastatin (LIPITOR) 40 MG tablet [[id number]]   allopurinol (ZYLOPRIM) 100 MG tablet [[id number]]   digoxin (LANOXIN) 0.125 MG tablet [[id number]]  metoprolol tartrate (LOPRESSOR) 50 MG tablet [[id number]]    Needs all medications refilled and sent to   [place], [place] [phone] (Phone) [phone] (Fax)

## 2018-07-23 ENCOUNTER — Other Ambulatory Visit: Payer: Self-pay | Admitting: General Practice

## 2018-07-23 DIAGNOSIS — Z7901 Long term (current) use of anticoagulants: Secondary | ICD-10-CM

## 2018-07-23 MED ORDER — WARFARIN SODIUM 5 MG PO TABS: ORAL_TABLET | ORAL | 0 refills | Status: DC

## 2018-07-25 ENCOUNTER — Other Ambulatory Visit: Payer: Self-pay | Admitting: General Practice

## 2018-07-25 DIAGNOSIS — Z7901 Long term (current) use of anticoagulants: Secondary | ICD-10-CM

## 2018-07-25 MED ORDER — WARFARIN SODIUM 5 MG PO TABS: ORAL_TABLET | ORAL | 0 refills | Status: DC

## 2018-07-25 NOTE — Telephone Encounter (Signed)
Pt called to check on these.  Wants to know why warfarin went to [facility] when he needs it to go to [place].

## 2018-07-29 ENCOUNTER — Ambulatory Visit (INDEPENDENT_AMBULATORY_CARE_PROVIDER_SITE_OTHER): Payer: Medicare Other | Admitting: General Practice

## 2018-07-29 ENCOUNTER — Other Ambulatory Visit: Payer: Self-pay

## 2018-07-29 DIAGNOSIS — Z86718 Personal history of other venous thrombosis and embolism: Secondary | ICD-10-CM

## 2018-07-29 DIAGNOSIS — Z7901 Long term (current) use of anticoagulants: Secondary | ICD-10-CM

## 2018-07-29 LAB — POCT INR: INR: 3.3 — AB (ref 2.0–3.0)

## 2018-07-29 NOTE — Patient Instructions (Addendum)
Pre visit review using our clinic review tool, if applicable. No additional management support is needed unless otherwise documented below in the visit note.  Hold dosage tomorrow and then change dosage and take 1 tablet daily except 1 1/2 tablets on Monday Wed and Fridays.  Re-check in 4 weeks.

## 2018-08-15 ENCOUNTER — Telehealth: Payer: Self-pay | Admitting: Internal Medicine

## 2018-08-15 MED ORDER — DILTIAZEM HCL ER BEADS 360 MG PO CP24: 360.0000 mg | ORAL_CAPSULE | Freq: Every day | ORAL | 1 refills | Status: DC

## 2018-08-15 NOTE — Telephone Encounter (Signed)
diltiazem (TIAZAC) 360 MG 24 hr capsule  Send to [facility]

## 2018-08-17 ENCOUNTER — Encounter: Payer: Medicare Other | Admitting: Internal Medicine

## 2018-08-22 ENCOUNTER — Encounter: Payer: Self-pay | Admitting: Internal Medicine

## 2018-08-22 ENCOUNTER — Other Ambulatory Visit (INDEPENDENT_AMBULATORY_CARE_PROVIDER_SITE_OTHER): Payer: Medicare Other

## 2018-08-22 ENCOUNTER — Other Ambulatory Visit: Payer: Self-pay

## 2018-08-22 ENCOUNTER — Telehealth: Payer: Self-pay

## 2018-08-22 ENCOUNTER — Ambulatory Visit (INDEPENDENT_AMBULATORY_CARE_PROVIDER_SITE_OTHER): Payer: Medicare Other | Admitting: Internal Medicine

## 2018-08-22 ENCOUNTER — Telehealth: Payer: Self-pay | Admitting: Internal Medicine

## 2018-08-22 VITALS — BP 152/94 | HR 80 | Temp 98.0°F | Resp 16 | Ht 73.5 in | Wt 319.0 lb

## 2018-08-22 DIAGNOSIS — I10 Essential (primary) hypertension: Secondary | ICD-10-CM | POA: Diagnosis not present

## 2018-08-22 DIAGNOSIS — E611 Iron deficiency: Secondary | ICD-10-CM | POA: Diagnosis not present

## 2018-08-22 DIAGNOSIS — Z0000 Encounter for general adult medical examination without abnormal findings: Secondary | ICD-10-CM

## 2018-08-22 DIAGNOSIS — E559 Vitamin D deficiency, unspecified: Secondary | ICD-10-CM | POA: Diagnosis not present

## 2018-08-22 DIAGNOSIS — R739 Hyperglycemia, unspecified: Secondary | ICD-10-CM

## 2018-08-22 DIAGNOSIS — E538 Deficiency of other specified B group vitamins: Secondary | ICD-10-CM | POA: Diagnosis not present

## 2018-08-22 LAB — BASIC METABOLIC PANEL
BUN: 23 mg/dL (ref 6–23)
CO2: 25 mEq/L (ref 19–32)
Calcium: 9.6 mg/dL (ref 8.4–10.5)
Chloride: 106 mEq/L (ref 96–112)
Creatinine, Ser: 1.33 mg/dL (ref 0.40–1.50)
GFR: 64.61 mL/min (ref >60.00)
Glucose, Bld: 81 mg/dL (ref 70–99)
Potassium: 4.8 mEq/L (ref 3.5–5.1)
Sodium: 140 mEq/L (ref 135–145)

## 2018-08-22 LAB — LIPID PANEL
Cholesterol: 148 mg/dL (ref 0–200)
HDL: 44.8 mg/dL (ref >39.00)
LDL Cholesterol: 85 mg/dL (ref 0–99)
NonHDL: 103.6
Total CHOL/HDL Ratio: 3
Triglycerides: 93 mg/dL (ref 0.0–149.0)
VLDL: 18.6 mg/dL (ref 0.0–40.0)

## 2018-08-22 LAB — CBC WITH DIFFERENTIAL/PLATELET
Basophils Absolute: 0.1 10*3/uL (ref 0.0–0.1)
Basophils Relative: 1 % (ref 0.0–3.0)
Eosinophils Absolute: 0.2 10*3/uL (ref 0.0–0.7)
Eosinophils Relative: 3 % (ref 0.0–5.0)
HCT: 49.5 % (ref 39.0–52.0)
Hemoglobin: 16.5 g/dL (ref 13.0–17.0)
Lymphocytes Relative: 23.8 % (ref 12.0–46.0)
Lymphs Abs: 1.4 10*3/uL (ref 0.7–4.0)
MCHC: 33.3 g/dL (ref 30.0–36.0)
MCV: 97.4 fl (ref 78.0–100.0)
Monocytes Absolute: 0.7 10*3/uL (ref 0.1–1.0)
Monocytes Relative: 11.9 % (ref 3.0–12.0)
Neutro Abs: 3.6 10*3/uL (ref 1.4–7.7)
Neutrophils Relative %: 60.3 % (ref 43.0–77.0)
Platelets: 164 10*3/uL (ref 150.0–400.0)
RBC: 5.08 Mil/uL (ref 4.22–5.81)
RDW: 16 % — ABNORMAL HIGH (ref 11.5–15.5)
WBC: 5.9 10*3/uL (ref 4.0–10.5)

## 2018-08-22 LAB — PSA: PSA: 0.62 ng/mL (ref 0.10–4.00)

## 2018-08-22 LAB — VITAMIN D 25 HYDROXY (VIT D DEFICIENCY, FRACTURES): VITD: 18.36 ng/mL — ABNORMAL LOW (ref 30.00–100.00)

## 2018-08-22 LAB — VITAMIN B12: Vitamin B-12: 671 pg/mL (ref 211–911)

## 2018-08-22 LAB — IBC PANEL
Iron: 62 ug/dL (ref 42–165)
Saturation Ratios: 22.4 % (ref 20.0–50.0)
Transferrin: 198 mg/dL — ABNORMAL LOW (ref 212.0–360.0)

## 2018-08-22 LAB — HEPATIC FUNCTION PANEL
ALT: 20 U/L (ref 0–53)
AST: 20 U/L (ref 0–37)
Albumin: 4 g/dL (ref 3.5–5.2)
Alkaline Phosphatase: 102 U/L (ref 39–117)
Bilirubin, Direct: 0.2 mg/dL (ref 0.0–0.3)
Total Bilirubin: 0.7 mg/dL (ref 0.2–1.2)
Total Protein: 7.7 g/dL (ref 6.0–8.3)

## 2018-08-22 LAB — TSH: TSH: 2.71 u[IU]/mL (ref 0.35–4.50)

## 2018-08-22 LAB — HEMOGLOBIN A1C: Hgb A1c MFr Bld: 6 % (ref 4.6–6.5)

## 2018-08-22 MED ORDER — VITAMIN D (ERGOCALCIFEROL) 1.25 MG (50000 UNIT) PO CAPS: 50000.0000 [IU] | ORAL_CAPSULE | ORAL | 0 refills | Status: DC

## 2018-08-22 MED ORDER — AMLODIPINE BESYLATE 5 MG PO TABS: 5.0000 mg | ORAL_TABLET | Freq: Every day | ORAL | 3 refills | Status: DC

## 2018-08-22 NOTE — Telephone Encounter (Signed)
Called pt, LVM.   CRM created.  

## 2018-08-22 NOTE — Telephone Encounter (Signed)
Done erx 

## 2018-08-22 NOTE — Patient Instructions (Signed)
Please take all new medication as prescribed - the amlodipine 5 mg per day  Please continue all other medications as before, and refills have been done if requested.  Please have the pharmacy call with any other refills you may need.  Please continue your efforts at being more active, low cholesterol diet, and weight control.  You are otherwise up to date with prevention measures today.  Please keep your appointments with your specialists as you may have planned  Please go to the LAB in the Basement (turn left off the elevator) for the tests to be done today  You will be contacted by phone if any changes need to be made immediately.  Otherwise, you will receive a letter about your results with an explanation, but please check with MyChart first.  Please remember to sign up for MyChart if you have not done so, as this will be important to you in the future with finding out test results, communicating by private email, and scheduling acute appointments online when needed.  Please return in 6 months, or sooner if needed

## 2018-08-22 NOTE — Telephone Encounter (Signed)
-----   Message from Biagio Borg, MD sent at 08/22/2018 12:02 PM EDT ----- Letter sent, cont same tx except for low Vit d  Please take Vitamin D 50000 units weekly for 12 weeks, then plan to change to OTC Vitamin D3 at 2000 units per day, indefinitely.  [name] to please inform pt, I will do rx

## 2018-08-22 NOTE — Progress Notes (Signed)
Subjective:    Patient ID: [name], male    DOB: September 27, 1950, 68 y.o.   MRN: [id number]  HPI  Here for wellness and f/u;  Overall doing ok;  Pt denies Chest pain, worsening SOB, DOE, wheezing, orthopnea, PND, worsening LE edema, palpitations, dizziness or syncope.  Pt denies neurological change such as new headache, facial or extremity weakness.  Pt denies polydipsia, polyuria, or low sugar symptoms. Pt states overall good compliance with treatment and medications, good tolerability, and has been trying to follow appropriate diet.  Pt denies worsening depressive symptoms, suicidal ideation or panic. No fever, night sweats, wt loss, loss of appetite, or other constitutional symptoms.  Pt states good ability with ADL's, has low fall risk, home safety reviewed and adequate, no other significant changes in hearing or vision, and only occasionally active with exercise.  Tried to exercise 45 min per day 5 days per wk.  Trying to avoid concentrated sweets, no sodas but wt not going down and BP elevated.  Chart does show wt coming down though Wt Readings from Last 3 Encounters:  08/22/18 (!) 319 lb (144.7 kg)  12/07/17 (!) 330 lb (149.7 kg)  07/09/17 (!) 313 lb (142 kg)   BP Readings from Last 3 Encounters:  08/22/18 (!) 152/94  01/17/18 (!) 153/96  12/07/17 124/86   Past Medical History:  Diagnosis Date  . AAA (abdominal aortic aneurysm) [facility]) July 2013   Abdominal Aortic Endovascular Stent Graft 08/11/11. Bilateral. Right is 3.2 & the left is 3.6. "Preclose" repair bilateral common femoral artery. Placement of catheter in aorta x 2. Repair of aorta w/modular bifurcated prosthesis w/bilateral limbs.  Placement of right limb extension: 23 mm x 12 cm. Placement of left limb extension: 14 mm x 7 cm.  . Anemia    From GI bleed in 05/11/11.   [name] Chronic anticoagulation, PE DVT PAF   . Chronic pain   . Depression   . Dyslipidemia   . Edema of left lower extremity    S/P I&D 08/30/06  . GI bleed  04/2011   With anemia. EGD & colonoscopy 05/11/2011  . History of alcohol dependence ([place])   . HTN (hypertension)   . Hyperlipemia   . Hypertension   . Iliac artery aneurysm, bilateral ([facility])    PVD, bilat common iliac aneurysms [443.9]  . Iliopsoas muscle hematoma 04/2011  . Lung nodule 2008  . Morbid obesity ([place])   . Morbid obesity with BMI of 40.0-44.9, adult ([place])   . Obstructive sleep apnea July 2013   Not wearing CPAP. Split-Night sleep study 06/03/11 AHI = 27.43/hr (TOTAL SLEEP TIME 105 minutes) and REM AHI = 120.00/hr  . Osteoarthritis of both knees    Bilateral Arthroscopy in 2000 & 2008. Both were complicated w/venous thromboembolism S/P IVC filter placement due to one being massive PE in 2008.  [name] PAF (paroxysmal atrial fibrillation)/atrial flutter 02/28/2010   ECHO : normal LV size, moderate concentricLVH, normal EF, normal atrial sizes, RV was normal but previously dilated;;  Myoview : Normal perfusion study. Low risk scan.  [name] PAH (pulmonary artery hypertension) ([place])   . Prolonged QT interval   . Psoriasis   . Pulmonary embolism ([place]) 2008   DVT --> Massive PE with right-sided strain. On coumadin. ECHO 02/28/10 showed normal LV size, moderate concentricLVH, normal EF, normal atrial sizes, RV was normal but previously dilated.   Past Surgical History:  Procedure Laterality Date  . ABDOMINAL AORTAGRAM N/A 05/14/2011   Procedure: ABDOMINAL  [name];  Surgeon: Conrad [place], MD;  Location: [facility] CATH LAB;  Service: Cardiovascular;  Laterality: N/A;  . Abdominal Aortic Evar  08/11/11   Bilateral. Right is 3.2 & the left is 3.6. "Preclose" repair bilateral common femoral artery. Placement of catheter in aorta x 2. Repair of aorta w/modular bifurcated prosthesis w/bilateral limbs.  Placement of right limb extension: 23 mm x 12 cm. Placement of left limb extension: 14 mm x 7 cm.  . ACHILLES TENDON SURGERY    . COLONOSCOPY  05/11/2011   For GI bleed. Performed by Dr. Juanita Craver, MD. Also  had a EGD performed at this time.  . COLONOSCOPY  05/12/2011  . EMBOLIZATION N/A 07/30/2011   Procedure: EMBOLIZATION;  Surgeon: Conrad Salamanca, MD;  Location: [facility] CATH LAB;  Service: Cardiovascular;  Laterality: N/A;  . ESOPHAGOGASTRODUODENOSCOPY  05/08/2011   For GI bleed/Anemia. Also had a colonoscopy at this time.  . INSERTION OF VENA CAVA FILTER N/A 05/14/2011   Procedure: INSERTION OF VENA CAVA FILTER;  Surgeon: Conrad Staunton, MD;  Location: [facility] CATH LAB;  Service: Cardiovascular;  Laterality: N/A;  . KNEE ARTHROSCOPY Bilateral 2000 & 2008   DJD knees. Both were complicated w/venous thromboembolism S/P IVC filter placement due to one being massive PE in 2008.  [name] KNEE SURGERY  1999  . LOWER EXTREMITY ANGIOGRAM N/A 05/14/2011   Procedure: LOWER EXTREMITY ANGIOGRAM;  Surgeon: Conrad Gladeview, MD;  Location: [facility] CATH LAB;  Service: Cardiovascular;  Laterality: N/A;    reports that he quit smoking about 12 years ago. He has a 1.00 pack-year smoking history. He has never used smokeless tobacco. He reports current alcohol use of about 1.0 - 2.0 standard drinks of alcohol per week. He reports current drug use. family history includes Arthritis in his father and mother; CAD (age of onset: 62) in his father; Cancer in his brother and mother. Allergies  Allergen Reactions  . Crestor [Rosuvastatin] Swelling   Current Outpatient Medications on File Prior to Visit  Medication Sig Dispense Refill  . allopurinol (ZYLOPRIM) 100 MG tablet Take 1 tablet (100 mg total) by mouth daily. 90 tablet 0  . atorvastatin (LIPITOR) 40 MG tablet Take 1 tablet (40 mg total) by mouth daily. 90 tablet 0  . clotrimazole (LOTRIMIN) 1 % cream Apply topically 2 (two) times daily. 30 g 1  . desonide (DESOWEN) 0.05 % cream Apply 1 application topically daily. Apply to rash on face    . digoxin (LANOXIN) 0.125 MG tablet Take 1 tablet (125 mcg total) by mouth daily. 90 tablet 0  . diltiazem (TIAZAC) 360 MG 24 hr capsule Take 1 capsule (360  mg total) by mouth daily. 90 capsule 1  . fluocinonide ointment (LIDEX) 9.37 % Apply 1 application topically 2 (two) times daily. Apply to dry skin areas on body    . fluocinonide-emollient (LIDEX-E) 0.05 % cream Apply 1 application topically 2 (two) times daily. 454 g 0  . ketoconazole (NIZORAL) 200 MG tablet Take 1 tablet (200 mg total) by mouth daily. 7 tablet 0  . metoprolol tartrate (LOPRESSOR) 50 MG tablet Take 1 tablet (50 mg total) by mouth 2 (two) times daily. 180 tablet 0  . nystatin (MYCOSTATIN/NYSTOP) powder Use as directed to affected area daily as needed 60 g 3  . triamcinolone ointment (KENALOG) 0.5 % Apply 1 application topically 2 (two) times daily. 45 g 1  . VIAGRA 100 MG tablet Take 0.5-1 tablets (50-100 mg total) by mouth daily as needed  for erectile dysfunction. 5 tablet 11  . warfarin (COUMADIN) 5 MG tablet TAKE 1 AND 1/2 TABLETS BY  MOUTH DAILY, EXCEPT ON  MONDAYS AND THURSDAYS TAKE  1 TABLET OR AS DIRECTED BY  COUMADIN CLINIC 90 day 120 tablet 0   No current facility-administered medications on file prior to visit.    Review of Systems Constitutional: Negative for other unusual diaphoresis, sweats, appetite or weight changes HENT: Negative for other worsening hearing loss, ear pain, facial swelling, mouth sores or neck stiffness.   Eyes: Negative for other worsening pain, redness or other visual disturbance.  Respiratory: Negative for other stridor or swelling Cardiovascular: Negative for other palpitations or other chest pain  Gastrointestinal: Negative for worsening diarrhea or loose stools, blood in stool, distention or other pain Genitourinary: Negative for hematuria, flank pain or other change in urine volume.  Musculoskeletal: Negative for myalgias or other joint swelling.  Skin: Negative for other color change, or other wound or worsening drainage.  Neurological: Negative for other syncope or numbness. Hematological: Negative for other adenopathy or swelling  Psychiatric/Behavioral: Negative for hallucinations, other worsening agitation, SI, self-injury, or new decreased concentration All other system neg per pt    Objective:   Physical Exam BP (!) 152/94   Pulse 80   Temp 98 F (36.7 C) (Oral)   Resp 16   Ht 6' 1.5" (1.867 m)   Wt (!) 319 lb (144.7 kg)   SpO2 97%   BMI 41.52 kg/m  VS noted,  Constitutional: Pt is oriented to person, place, and time. Appears well-developed and well-nourished, in no significant distress and comfortable Head: Normocephalic and atraumatic  Eyes: Conjunctivae and EOM are normal. Pupils are equal, round, and reactive to light Right Ear: External ear normal without discharge Left Ear: External ear normal without discharge Nose: Nose without discharge or deformity Mouth/Throat: Oropharynx is without other ulcerations and moist  Neck: Normal range of motion. Neck supple. No JVD present. No tracheal deviation present or significant neck LA or mass Cardiovascular: Normal rate, irregular rhythm, normal heart sounds and intact distal pulses.   Pulmonary/Chest: WOB normal and breath sounds without rales or wheezing  Abdominal: Soft. Bowel sounds are normal. NT. No HSM  Musculoskeletal: Normal range of motion. Exhibits no edema Lymphadenopathy: Has no other cervical adenopathy.  Neurological: Pt is alert and oriented to person, place, and time. Pt has normal reflexes. No cranial nerve deficit. Motor grossly intact, Gait intact Skin: Skin is warm and dry. No rash noted or new ulcerations Psychiatric:  Has normal mood and affect. Behavior is normal without agitation No other exam findings Lab Results  Component Value Date   WBC 5.2 07/09/2017   HGB 16.4 07/09/2017   HCT 49.1 07/09/2017   PLT 169.0 07/09/2017   GLUCOSE 97 07/09/2017   CHOL 174 07/09/2017   TRIG 113.0 07/09/2017   HDL 48.50 07/09/2017   LDLCALC 103 (H) 07/09/2017   ALT 24 07/09/2017   AST 22 07/09/2017   NA 139 07/09/2017   K 4.3 07/09/2017    CL 105 07/09/2017   CREATININE 1.13 07/09/2017   BUN 23 07/09/2017   CO2 27 07/09/2017   TSH 2.27 07/09/2017   PSA 0.92 07/09/2017   INR 3.3 (A) 07/29/2018   HGBA1C 5.9 07/09/2017      Assessment & Plan:

## 2018-08-24 ENCOUNTER — Other Ambulatory Visit (INDEPENDENT_AMBULATORY_CARE_PROVIDER_SITE_OTHER): Payer: Medicare Other

## 2018-08-24 ENCOUNTER — Encounter: Payer: Self-pay | Admitting: Internal Medicine

## 2018-08-24 DIAGNOSIS — Z0000 Encounter for general adult medical examination without abnormal findings: Secondary | ICD-10-CM | POA: Diagnosis not present

## 2018-08-24 LAB — URINALYSIS, ROUTINE W REFLEX MICROSCOPIC
Bilirubin Urine: NEGATIVE
Hgb urine dipstick: NEGATIVE
Ketones, ur: NEGATIVE
Leukocytes,Ua: NEGATIVE
Nitrite: NEGATIVE
Specific Gravity, Urine: 1.02 (ref 1.000–1.030)
Total Protein, Urine: NEGATIVE
Urine Glucose: NEGATIVE
Urobilinogen, UA: 0.2 (ref 0.0–1.0)
pH: 5.5 (ref 5.0–8.0)

## 2018-08-25 ENCOUNTER — Encounter: Payer: Self-pay | Admitting: Internal Medicine

## 2018-08-25 NOTE — Assessment & Plan Note (Signed)

## 2018-08-25 NOTE — Assessment & Plan Note (Addendum)
Mild uncontrolled, to add amloidpine 5 qd, o/w stable overall by history and exam, recent data reviewed with pt, and pt to continue medical treatment as before,  to f/u any worsening symptoms or concerns

## 2018-08-25 NOTE — Assessment & Plan Note (Signed)
stable overall by history and exam, recent data reviewed with pt, and pt to continue medical treatment as before,  to f/u any worsening symptoms or concerns  

## 2018-08-26 ENCOUNTER — Ambulatory Visit: Payer: Medicare Other

## 2018-08-29 ENCOUNTER — Telehealth: Payer: Self-pay | Admitting: Internal Medicine

## 2018-08-29 NOTE — Telephone Encounter (Signed)
Will forward to PCP PEC due to PAA needed.

## 2018-08-29 NOTE — Telephone Encounter (Signed)
Copied from [place] [id number]. Topic: Quick Communication - Rx Refill/Question >> Aug 29, 2018  2:24 PM Izola Price, [place] A wrote: Medication: amLODipine (NORVASC) 5 MG tablet (Patient called and stated the [facility] rx needs prior authorization for medication.)  Has the patient contacted their pharmacy? Yes (Agent: If no, request that the patient contact the pharmacy for the refill.) (Agent: If yes, when and what did the pharmacy advise?)Contact PCP  Preferred Pharmacy (with phone number or street name): [place], [place] [phone] (Phone) [phone] (Fax)    Agent: Please be advised that RX refills may take up to 3 business days. We ask that you follow-up with your pharmacy.

## 2018-09-02 ENCOUNTER — Other Ambulatory Visit: Payer: Self-pay

## 2018-09-02 ENCOUNTER — Ambulatory Visit (INDEPENDENT_AMBULATORY_CARE_PROVIDER_SITE_OTHER): Payer: Medicare Other | Admitting: General Practice

## 2018-09-02 DIAGNOSIS — Z23 Encounter for immunization: Secondary | ICD-10-CM

## 2018-09-02 DIAGNOSIS — Z7901 Long term (current) use of anticoagulants: Secondary | ICD-10-CM

## 2018-09-02 DIAGNOSIS — Z86718 Personal history of other venous thrombosis and embolism: Secondary | ICD-10-CM

## 2018-09-02 LAB — POCT INR: INR: 2.9 (ref 2.0–3.0)

## 2018-09-02 NOTE — Patient Instructions (Addendum)
Pre visit review using our clinic review tool, if applicable. No additional management support is needed unless otherwise documented below in the visit note.  Continue to take 1 tablet daily except 1 1/2 tablets on Monday Wed and Fridays.  Re-check in 4 weeks.

## 2018-09-09 ENCOUNTER — Other Ambulatory Visit: Payer: Self-pay | Admitting: Internal Medicine

## 2018-09-15 ENCOUNTER — Other Ambulatory Visit: Payer: Self-pay | Admitting: Internal Medicine

## 2018-09-30 ENCOUNTER — Ambulatory Visit: Payer: Medicare Other

## 2018-10-22 ENCOUNTER — Other Ambulatory Visit: Payer: Self-pay | Admitting: Internal Medicine

## 2018-10-22 DIAGNOSIS — Z7901 Long term (current) use of anticoagulants: Secondary | ICD-10-CM

## 2018-11-15 ENCOUNTER — Encounter: Payer: Self-pay | Admitting: Internal Medicine

## 2018-11-15 ENCOUNTER — Ambulatory Visit (INDEPENDENT_AMBULATORY_CARE_PROVIDER_SITE_OTHER): Payer: Medicare Other | Admitting: Internal Medicine

## 2018-11-15 ENCOUNTER — Other Ambulatory Visit: Payer: Self-pay

## 2018-11-15 VITALS — BP 138/76 | HR 83 | Temp 97.9°F | Wt 315.0 lb

## 2018-11-15 DIAGNOSIS — R739 Hyperglycemia, unspecified: Secondary | ICD-10-CM | POA: Diagnosis not present

## 2018-11-15 DIAGNOSIS — I10 Essential (primary) hypertension: Secondary | ICD-10-CM | POA: Diagnosis not present

## 2018-11-15 DIAGNOSIS — E559 Vitamin D deficiency, unspecified: Secondary | ICD-10-CM

## 2018-11-15 DIAGNOSIS — Z7901 Long term (current) use of anticoagulants: Secondary | ICD-10-CM

## 2018-11-15 MED ORDER — HYDROCHLOROTHIAZIDE 12.5 MG PO CAPS: 12.5000 mg | ORAL_CAPSULE | Freq: Every day | ORAL | 3 refills | Status: DC

## 2018-11-15 NOTE — Progress Notes (Signed)
Subjective:    Patient ID: [name], male    DOB: 16-Apr-1950, 68 y.o.   MRN: [id number]  HPI Here for wellness and f/u;  Overall doing ok;  Pt denies Chest pain, worsening SOB, DOE, wheezing, orthopnea, PND, worsening LE edema, palpitations, dizziness or syncope.  Pt denies neurological change such as new headache, facial or extremity weakness.  Pt denies polydipsia, polyuria, or low sugar symptoms. Pt states overall good compliance with treatment and medications, good tolerability, and has been trying to follow appropriate diet.  Pt denies worsening depressive symptoms, suicidal ideation or panic. No fever, night sweats, wt loss, loss of appetite, or other constitutional symptoms.  Pt states good ability with ADL's, has low fall risk, home safety reviewed and adequate, no other significant changes in hearing or vision, and not active with exercise due to ongoing right foot pain, plans to f/u again with podiatry.  Has been able to lose wt with better diet, though, and BP some improved today here, but > 140/90 at home usually, asking for increased tx.  Now also taking oral vit d  2000 qd, and has coumadin clinic f/u next wk, having missed last month.  No overt bleeding though has several small bruising to the forearms BP Readings from Last 3 Encounters:  11/15/18 138/76  08/22/18 (!) 152/94  01/17/18 (!) 153/96   Wt Readings from Last 3 Encounters:  11/15/18 (!) 315 lb (142.9 kg)  08/22/18 (!) 319 lb (144.7 kg)  12/07/17 (!) 330 lb (149.7 kg)   Past Medical History:  Diagnosis Date  . AAA (abdominal aortic aneurysm) [facility]) July 2013   Abdominal Aortic Endovascular Stent Graft 08/11/11. Bilateral. Right is 3.2 & the left is 3.6. "Preclose" repair bilateral common femoral artery. Placement of catheter in aorta x 2. Repair of aorta w/modular bifurcated prosthesis w/bilateral limbs.  Placement of right limb extension: 23 mm x 12 cm. Placement of left limb extension: 14 mm x 7 cm.  . Anemia    From GI bleed in 05/11/11.   [name] Chronic anticoagulation, PE DVT PAF   . Chronic pain   . Depression   . Dyslipidemia   . Edema of left lower extremity    S/P I&D 08/30/06  . GI bleed 04/2011   With anemia. EGD & colonoscopy 05/11/2011  . History of alcohol dependence ([place])   . HTN (hypertension)   . Hyperlipemia   . Hypertension   . Iliac artery aneurysm, bilateral ([facility])    PVD, bilat common iliac aneurysms [443.9]  . Iliopsoas muscle hematoma 04/2011  . Lung nodule 2008  . Morbid obesity ([place])   . Morbid obesity with BMI of 40.0-44.9, adult ([place])   . Obstructive sleep apnea July 2013   Not wearing CPAP. Split-Night sleep study 06/03/11 AHI = 27.43/hr (TOTAL SLEEP TIME 105 minutes) and REM AHI = 120.00/hr  . Osteoarthritis of both knees    Bilateral Arthroscopy in 2000 & 2008. Both were complicated w/venous thromboembolism S/P IVC filter placement due to one being massive PE in 2008.  [name] PAF (paroxysmal atrial fibrillation)/atrial flutter 02/28/2010   ECHO : normal LV size, moderate concentricLVH, normal EF, normal atrial sizes, RV was normal but previously dilated;;  Myoview : Normal perfusion study. Low risk scan.  [name] PAH (pulmonary artery hypertension) ([place])   . Prolonged QT interval   . Psoriasis   . Pulmonary embolism ([place]) 2008   DVT --> Massive PE with right-sided strain. On coumadin. ECHO 02/28/10 showed normal LV  size, moderate concentricLVH, normal EF, normal atrial sizes, RV was normal but previously dilated.   Past Surgical History:  Procedure Laterality Date  . ABDOMINAL AORTAGRAM N/A 05/14/2011   Procedure: ABDOMINAL [name];  Surgeon: Conrad Dennison, MD;  Location: [facility] CATH LAB;  Service: Cardiovascular;  Laterality: N/A;  . Abdominal Aortic Evar  08/11/11   Bilateral. Right is 3.2 & the left is 3.6. "Preclose" repair bilateral common femoral artery. Placement of catheter in aorta x 2. Repair of aorta w/modular bifurcated prosthesis w/bilateral limbs.  Placement of right limb  extension: 23 mm x 12 cm. Placement of left limb extension: 14 mm x 7 cm.  . ACHILLES TENDON SURGERY    . COLONOSCOPY  05/11/2011   For GI bleed. Performed by Dr. Juanita Craver, MD. Also had a EGD performed at this time.  . COLONOSCOPY  05/12/2011  . EMBOLIZATION N/A 07/30/2011   Procedure: EMBOLIZATION;  Surgeon: Conrad Badger, MD;  Location: [facility] CATH LAB;  Service: Cardiovascular;  Laterality: N/A;  . ESOPHAGOGASTRODUODENOSCOPY  05/08/2011   For GI bleed/Anemia. Also had a colonoscopy at this time.  . INSERTION OF VENA CAVA FILTER N/A 05/14/2011   Procedure: INSERTION OF VENA CAVA FILTER;  Surgeon: Conrad Gordonville, MD;  Location[facility] CATH LAB;  Service: Cardiovascular;  Laterality: N/A;  . KNEE ARTHROSCOPY Bilateral 2000 & 2008   DJD knees. Both were complicated w/venous thromboembolism S/P IVC filter placement due to one being massive PE in 2008.  [name] KNEE SURGERY  1999  . LOWER EXTREMITY ANGIOGRAM N/A 05/14/2011   Procedure: LOWER EXTREMITY ANGIOGRAM;  Surgeon: Conrad New Hebron, MD;  Location: [facility] CATH LAB;  Service: Cardiovascular;  Laterality: N/A;    reports that he quit smoking about 12 years ago. He has a 1.00 pack-year smoking history. He has never used smokeless tobacco. He reports current alcohol use of about 1.0 - 2.0 standard drinks of alcohol per week. He reports current drug use. family history includes Arthritis in his father and mother; CAD (age of onset: 65) in his father; Cancer in his brother and mother. Allergies  Allergen Reactions  . Crestor [Rosuvastatin] Swelling   Current Outpatient Medications on File Prior to Visit  Medication Sig Dispense Refill  . allopurinol (ZYLOPRIM) 100 MG tablet TAKE 1 TABLET BY MOUTH  DAILY 90 tablet 3  . atorvastatin (LIPITOR) 40 MG tablet TAKE 1 TABLET BY MOUTH  DAILY 90 tablet 3  . desonide (DESOWEN) 0.05 % cream Apply 1 application topically daily. Apply to rash on face    . digoxin (LANOXIN) 0.125 MG tablet TAKE 1 TABLET BY MOUTH  DAILY 90 tablet 3  .  diltiazem (TIAZAC) 360 MG 24 hr capsule Take 1 capsule (360 mg total) by mouth daily. 90 capsule 1  . fluocinonide ointment (LIDEX) [id number] % Apply 1 application topically 2 (two) times daily. Apply to dry skin areas on body    . fluocinonide-emollient (LIDEX-E) 0.05 % cream Apply 1 application topically 2 (two) times daily. 454 g 0  . ketoconazole (NIZORAL) 200 MG tablet Take 1 tablet (200 mg total) by mouth daily. 7 tablet 0  . metoprolol tartrate (LOPRESSOR) 50 MG tablet TAKE 1 TABLET BY MOUTH  TWICE DAILY 180 tablet 3  . nystatin (MYCOSTATIN/NYSTOP) powder Use as directed to affected area daily as needed 60 g 3  . triamcinolone ointment (KENALOG) 0.5 % Apply 1 application topically 2 (two) times daily. 45 g 1  . VIAGRA 100 MG tablet TAKE 1/2 TO 1 (  ONE-HALF TO ONE) TABLET BY MOUTH DAILY AS NEEDED FOR  ERECTILE  DYSFUNCTION 10 tablet 0  . Vitamin D, Ergocalciferol, (DRISDOL) 1.25 MG (50000 UT) CAPS capsule Take 1 capsule (50,000 Units total) by mouth every 7 (seven) days. 12 capsule 0  . warfarin (COUMADIN) 5 MG tablet TAKE 1 AND 1/2 TABLETS BY  MOUTH DAILY EXCEPT ON  MONDAYS AND THURSDAYS TAKE  1 TABLET OR AS DIRECTED BY  COUMADIN CLINIC 120 tablet 0  . clotrimazole (LOTRIMIN) 1 % cream Apply topically 2 (two) times daily. (Patient not taking: Reported on 11/15/2018) 30 g 1   No current facility-administered medications on file prior to visit.    Review of Systems  Constitutional: Negative for other unusual diaphoresis or sweats HENT: Negative for ear discharge or swelling Eyes: Negative for other worsening visual disturbances Respiratory: Negative for stridor or other swelling  Gastrointestinal: Negative for worsening distension or other blood Genitourinary: Negative for retention or other urinary change Musculoskeletal: Negative for other MSK pain or swelling Skin: Negative for color change or other new lesions Neurological: Negative for worsening tremors and other numbness   Psychiatric/Behavioral: Negative for worsening agitation or other fatigue All otherwise neg per pt    Objective:   Physical Exam BP 138/76 (BP Location: Left Arm)   Pulse 83   Temp 97.9 F (36.6 C) (Oral)   Wt (!) 315 lb (142.9 kg)   SpO2 98%   BMI 41.00 kg/m  VS noted,  Constitutional: Pt appears in NAD HENT: Head: NCAT.  Right Ear: External ear normal.  Left Ear: External ear normal.  Eyes: . Pupils are equal, round, and reactive to light. Conjunctivae and EOM are normal Nose: without d/c or deformity Neck: Neck supple. Gross normal ROM Cardiovascular: Normal rate and regular rhythm.   Pulmonary/Chest: Effort normal and breath sounds without rales or wheezing.  Abd:  Soft, NT, ND, + BS, no organomegaly Neurological: Pt is alert. At baseline orientation, motor grossly intact Skin: Skin is warm. No rashes, other new lesions, no LE edema Psychiatric: Pt behavior is normal without agitation  All otherwise neg per pt Lab Results  Component Value Date   WBC 5.9 08/22/2018   HGB 16.5 08/22/2018   HCT 49.5 08/22/2018   PLT 164.0 08/22/2018   GLUCOSE 81 08/22/2018   CHOL 148 08/22/2018   TRIG 93.0 08/22/2018   HDL 44.80 08/22/2018   LDLCALC 85 08/22/2018   ALT 20 08/22/2018   AST 20 08/22/2018   NA 140 08/22/2018   K 4.8 08/22/2018   CL 106 08/22/2018   CREATININE 1.33 08/22/2018   BUN 23 08/22/2018   CO2 25 08/22/2018   TSH 2.71 08/22/2018   PSA 0.62 08/22/2018   INR 2.9 09/02/2018   HGBA1C 6.0 08/22/2018         Assessment & Plan:

## 2018-11-15 NOTE — Assessment & Plan Note (Signed)
To cont oral replacement 

## 2018-11-15 NOTE — Assessment & Plan Note (Signed)
Mild uncontrolled, to add HCT 12.5 qd, o/w stable overall by history and exam, recent data reviewed with pt, and pt to continue medical treatment as before,  to f/u any worsening symptoms or concerns

## 2018-11-15 NOTE — Assessment & Plan Note (Signed)
stable overall by history and exam, recent data reviewed with pt, and pt to continue medical treatment as before,  to f/u any worsening symptoms or concerns  

## 2018-11-15 NOTE — Assessment & Plan Note (Signed)
Encouraged compliance with coumadin clinic, no overt bleeding

## 2018-11-15 NOTE — Patient Instructions (Signed)
Please take all new medication as prescribed - the mild fluid pill  Please monitor your BP at home if possible every few days, with the goal being to be less than 140/90  Please continue all other medications as before, and refills have been done if requested.  Please have the pharmacy call with any other refills you may need.  Please continue your efforts at being more active, low cholesterol diet, and weight control.  Please keep your appointments with your specialists as you may have planned - coumadin clinic next wk  Please return in 3 months, or sooner if needed

## 2018-11-18 ENCOUNTER — Ambulatory Visit: Payer: Medicare Other

## 2018-11-22 ENCOUNTER — Other Ambulatory Visit: Payer: Self-pay

## 2018-11-22 ENCOUNTER — Ambulatory Visit (INDEPENDENT_AMBULATORY_CARE_PROVIDER_SITE_OTHER): Payer: Medicare Other | Admitting: General Practice

## 2018-11-22 DIAGNOSIS — Z7901 Long term (current) use of anticoagulants: Secondary | ICD-10-CM

## 2018-11-22 LAB — POCT INR: INR: 3.5 — AB (ref 2.0–3.0)

## 2018-11-22 NOTE — Patient Instructions (Addendum)
Pre visit review using our clinic review tool, if applicable. No additional management support is needed unless otherwise documented below in the visit note.  Skip dosage today and then continue to take 1 tablet daily except 1 1/2 tablets on Monday Wed and Fridays.  Re-check in 4 weeks.

## 2018-11-22 NOTE — Progress Notes (Signed)
Medical screening examination/treatment/procedure(s) were performed by non-physician practitioner and as supervising physician I was immediately available for consultation/collaboration. I agree with above. James John, MD   

## 2018-12-10 ENCOUNTER — Other Ambulatory Visit: Payer: Self-pay | Admitting: Internal Medicine

## 2018-12-20 ENCOUNTER — Ambulatory Visit (INDEPENDENT_AMBULATORY_CARE_PROVIDER_SITE_OTHER): Payer: Medicare Other | Admitting: General Practice

## 2018-12-20 ENCOUNTER — Other Ambulatory Visit: Payer: Self-pay

## 2018-12-20 DIAGNOSIS — Z7901 Long term (current) use of anticoagulants: Secondary | ICD-10-CM

## 2018-12-20 DIAGNOSIS — Z86718 Personal history of other venous thrombosis and embolism: Secondary | ICD-10-CM

## 2018-12-20 LAB — POCT INR: INR: 2.5 (ref 2.0–3.0)

## 2018-12-20 NOTE — Patient Instructions (Signed)
Pre visit review using our clinic review tool, if applicable. No additional management support is needed unless otherwise documented below in the visit note.  Continue to take 1 tablet daily except 1 1/2 tablets on Monday Wed and Fridays.  Re-check in 4 weeks.

## 2018-12-20 NOTE — Progress Notes (Signed)
Medical screening examination/treatment/procedure(s) were performed by non-physician practitioner and as supervising physician I was immediately available for consultation/collaboration. I agree with above. Lillee Mooneyhan, MD   

## 2018-12-27 ENCOUNTER — Other Ambulatory Visit: Payer: Self-pay | Admitting: Internal Medicine

## 2018-12-27 DIAGNOSIS — Z7901 Long term (current) use of anticoagulants: Secondary | ICD-10-CM

## 2019-01-24 ENCOUNTER — Ambulatory Visit (INDEPENDENT_AMBULATORY_CARE_PROVIDER_SITE_OTHER): Payer: Medicare Other | Admitting: General Practice

## 2019-01-24 ENCOUNTER — Other Ambulatory Visit: Payer: Self-pay

## 2019-01-24 DIAGNOSIS — Z7901 Long term (current) use of anticoagulants: Secondary | ICD-10-CM

## 2019-01-24 LAB — POCT INR: INR: 2.8 (ref 2.0–3.0)

## 2019-01-24 NOTE — Progress Notes (Signed)
Medical screening examination/treatment/procedure(s) were performed by non-physician practitioner and as supervising physician I was immediately available for consultation/collaboration. I agree with above. Doretta Remmert, MD   

## 2019-01-24 NOTE — Patient Instructions (Addendum)
.  lbpcmh  Continue to take 1 tablet daily except 1 1/2 tablets on Monday Wed and Fridays.  Re-check in 4 weeks.

## 2019-02-01 ENCOUNTER — Telehealth: Payer: Self-pay | Admitting: *Deleted

## 2019-02-01 NOTE — Telephone Encounter (Signed)
Yes, no problem!

## 2019-02-01 NOTE — Telephone Encounter (Signed)
Copied from [place] [phone]. Topic: General - Other >> Feb 01, 2019 10:01 AM Leward Quan A wrote: Reason for CRM: Patient called to inquire of Dr Jenny Reichmann if its ok for him to get the covid 19 vaccine since he is on warfarin (COUMADIN). Asking for a call back with an answer please Ph#  [phone]

## 2019-02-01 NOTE — Telephone Encounter (Signed)
Notified pt w/MD response.../lmb 

## 2019-02-21 ENCOUNTER — Ambulatory Visit (INDEPENDENT_AMBULATORY_CARE_PROVIDER_SITE_OTHER): Payer: Medicare Other | Admitting: General Practice

## 2019-02-21 ENCOUNTER — Other Ambulatory Visit: Payer: Self-pay

## 2019-02-21 DIAGNOSIS — Z86718 Personal history of other venous thrombosis and embolism: Secondary | ICD-10-CM | POA: Diagnosis not present

## 2019-02-21 DIAGNOSIS — Z7901 Long term (current) use of anticoagulants: Secondary | ICD-10-CM

## 2019-02-21 LAB — POCT INR: INR: 3.5 — AB (ref 2.0–3.0)

## 2019-02-21 NOTE — Patient Instructions (Signed)
Pre visit review using our clinic review tool, if applicable. No additional management support is needed unless otherwise documented below in the visit note.  Skip dosage today and then continue to take 1 tablet daily except 1 1/2 tablets on Monday Wed and Fridays.  Re-check in 3 to 4 weeks.

## 2019-02-21 NOTE — Progress Notes (Signed)
Medical screening examination/treatment/procedure(s) were performed by non-physician practitioner and as supervising physician I was immediately available for consultation/collaboration. I agree with above. Darrel Gloss, MD   

## 2019-02-23 ENCOUNTER — Ambulatory Visit: Payer: Medicare Other | Admitting: Internal Medicine

## 2019-02-23 ENCOUNTER — Telehealth: Payer: Self-pay

## 2019-02-23 NOTE — Telephone Encounter (Signed)
Pt is having a tooth removed needing to know how many days to stop coumadin prior to appt../l,mb

## 2019-02-23 NOTE — Telephone Encounter (Signed)
New message    Request for surgical clearance:  1. What type of surgery is being performed? Tooth removal    2. When is this surgery scheduled? Pending   3. What type of clearance is required (medical clearance vs. Pharmacy clearance to hold med vs. Both)? Patient unsure   4. Are there any medications that need to be held prior to surgery and how long? warfarin (COUMADIN) 5 MG tablet  5. Practice name and name of physician performing surgery? Patient does not have info   6. What is your office phone number Patient does not have info    7.   What is your office fax number Patient does not have info   8.   Anesthesia type (None, local, MAC, general) ? Patient does not have info    Marcine Matar 02/23/2019, 10:35 AM  _________________________________________________________________

## 2019-02-23 NOTE — Telephone Encounter (Signed)
Ok for hold 5 days prior  But Cora we can provide lovenox bridging?  thanks

## 2019-02-24 ENCOUNTER — Telehealth: Payer: Self-pay | Admitting: General Practice

## 2019-02-24 NOTE — Telephone Encounter (Signed)
I will reach out to patient about Lovenox bridge.

## 2019-02-24 NOTE — Telephone Encounter (Signed)
LMOVM for patient to call Villa Herb, RN @ [phone] about upcoming dental procedure.

## 2019-02-27 ENCOUNTER — Other Ambulatory Visit: Payer: Self-pay | Admitting: Internal Medicine

## 2019-03-15 ENCOUNTER — Ambulatory Visit: Payer: Medicare Other | Admitting: Internal Medicine

## 2019-03-21 ENCOUNTER — Other Ambulatory Visit: Payer: Self-pay

## 2019-03-21 ENCOUNTER — Ambulatory Visit (INDEPENDENT_AMBULATORY_CARE_PROVIDER_SITE_OTHER): Payer: Medicare Other | Admitting: General Practice

## 2019-03-21 DIAGNOSIS — Z86718 Personal history of other venous thrombosis and embolism: Secondary | ICD-10-CM | POA: Diagnosis not present

## 2019-03-21 DIAGNOSIS — Z7901 Long term (current) use of anticoagulants: Secondary | ICD-10-CM

## 2019-03-21 LAB — POCT INR: INR: 3.1 — AB (ref 2.0–3.0)

## 2019-03-21 NOTE — Progress Notes (Signed)
Medical screening examination/treatment/procedure(s) were performed by non-physician practitioner and as supervising physician I was immediately available for consultation/collaboration. I agree with above. Guilherme Schwenke, MD   

## 2019-03-21 NOTE — Patient Instructions (Signed)
Pre visit review using our clinic review tool, if applicable. No additional management support is needed unless otherwise documented below in the visit note.  Skip dosage today and ask dentist about dose on Wednesday.  Take 1 1/2 tablets on Thursday and 2 tablets on Friday.  On Saturday continue to take 1 tablet daily except 1 1/2 tablets on Mon Wed and Fridays.  Re-check in 3 to 4 weeks.

## 2019-03-22 ENCOUNTER — Other Ambulatory Visit: Payer: Self-pay | Admitting: Internal Medicine

## 2019-03-22 DIAGNOSIS — Z7901 Long term (current) use of anticoagulants: Secondary | ICD-10-CM

## 2019-03-29 ENCOUNTER — Ambulatory Visit (INDEPENDENT_AMBULATORY_CARE_PROVIDER_SITE_OTHER): Payer: Medicare Other | Admitting: General Practice

## 2019-03-29 ENCOUNTER — Other Ambulatory Visit: Payer: Self-pay

## 2019-03-29 ENCOUNTER — Telehealth: Payer: Self-pay

## 2019-03-29 ENCOUNTER — Ambulatory Visit (INDEPENDENT_AMBULATORY_CARE_PROVIDER_SITE_OTHER): Payer: Medicare Other | Admitting: Internal Medicine

## 2019-03-29 ENCOUNTER — Encounter: Payer: Self-pay | Admitting: Internal Medicine

## 2019-03-29 VITALS — BP 130/82 | HR 84 | Temp 98.1°F | Ht 73.5 in | Wt 300.0 lb

## 2019-03-29 DIAGNOSIS — E538 Deficiency of other specified B group vitamins: Secondary | ICD-10-CM | POA: Diagnosis not present

## 2019-03-29 DIAGNOSIS — L409 Psoriasis, unspecified: Secondary | ICD-10-CM

## 2019-03-29 DIAGNOSIS — Z7901 Long term (current) use of anticoagulants: Secondary | ICD-10-CM

## 2019-03-29 DIAGNOSIS — R739 Hyperglycemia, unspecified: Secondary | ICD-10-CM

## 2019-03-29 DIAGNOSIS — Z125 Encounter for screening for malignant neoplasm of prostate: Secondary | ICD-10-CM

## 2019-03-29 DIAGNOSIS — E611 Iron deficiency: Secondary | ICD-10-CM | POA: Diagnosis not present

## 2019-03-29 DIAGNOSIS — Z0000 Encounter for general adult medical examination without abnormal findings: Secondary | ICD-10-CM | POA: Diagnosis not present

## 2019-03-29 DIAGNOSIS — E559 Vitamin D deficiency, unspecified: Secondary | ICD-10-CM

## 2019-03-29 DIAGNOSIS — Z86718 Personal history of other venous thrombosis and embolism: Secondary | ICD-10-CM

## 2019-03-29 LAB — CBC WITH DIFFERENTIAL/PLATELET
Basophils Absolute: 0 10*3/uL (ref 0.0–0.1)
Basophils Relative: 0.5 % (ref 0.0–3.0)
Eosinophils Absolute: 0.1 10*3/uL (ref 0.0–0.7)
Eosinophils Relative: 1.6 % (ref 0.0–5.0)
HCT: 50.8 % (ref 39.0–52.0)
Hemoglobin: 16.8 g/dL (ref 13.0–17.0)
Lymphocytes Relative: 30.8 % (ref 12.0–46.0)
Lymphs Abs: 1.4 10*3/uL (ref 0.7–4.0)
MCHC: 33 g/dL (ref 30.0–36.0)
MCV: 97.7 fl (ref 78.0–100.0)
Monocytes Absolute: 0.6 10*3/uL (ref 0.1–1.0)
Monocytes Relative: 13.8 % — ABNORMAL HIGH (ref 3.0–12.0)
Neutro Abs: 2.5 10*3/uL (ref 1.4–7.7)
Neutrophils Relative %: 53.3 % (ref 43.0–77.0)
Platelets: 172 10*3/uL (ref 150.0–400.0)
RBC: 5.21 Mil/uL (ref 4.22–5.81)
RDW: 15.6 % — ABNORMAL HIGH (ref 11.5–15.5)
WBC: 4.7 10*3/uL (ref 4.0–10.5)

## 2019-03-29 LAB — URINALYSIS, ROUTINE W REFLEX MICROSCOPIC
Ketones, ur: NEGATIVE
Leukocytes,Ua: NEGATIVE
Nitrite: NEGATIVE
Specific Gravity, Urine: 1.025 (ref 1.000–1.030)
Urine Glucose: NEGATIVE
Urobilinogen, UA: 1 (ref 0.0–1.0)
pH: 5.5 (ref 5.0–8.0)

## 2019-03-29 LAB — BASIC METABOLIC PANEL
BUN: 34 mg/dL — ABNORMAL HIGH (ref 6–23)
CO2: 29 mEq/L (ref 19–32)
Calcium: 10.1 mg/dL (ref 8.4–10.5)
Chloride: 100 mEq/L (ref 96–112)
Creatinine, Ser: 1.38 mg/dL (ref 0.40–1.50)
GFR: 61.8 mL/min (ref >60.00)
Glucose, Bld: 83 mg/dL (ref 70–99)
Potassium: 4.8 mEq/L (ref 3.5–5.1)
Sodium: 138 mEq/L (ref 135–145)

## 2019-03-29 LAB — LIPID PANEL
Cholesterol: 136 mg/dL (ref 0–200)
HDL: 43.2 mg/dL (ref >39.00)
LDL Cholesterol: 69 mg/dL (ref 0–99)
NonHDL: 92.42
Total CHOL/HDL Ratio: 3
Triglycerides: 119 mg/dL (ref 0.0–149.0)
VLDL: 23.8 mg/dL (ref 0.0–40.0)

## 2019-03-29 LAB — IBC PANEL
Iron: 120 ug/dL (ref 42–165)
Saturation Ratios: 49 % (ref 20.0–50.0)
Transferrin: 175 mg/dL — ABNORMAL LOW (ref 212.0–360.0)

## 2019-03-29 LAB — HEPATIC FUNCTION PANEL
ALT: 19 U/L (ref 0–53)
AST: 24 U/L (ref 0–37)
Albumin: 3.8 g/dL (ref 3.5–5.2)
Alkaline Phosphatase: 120 U/L — ABNORMAL HIGH (ref 39–117)
Bilirubin, Direct: 0.2 mg/dL (ref 0.0–0.3)
Total Bilirubin: 0.9 mg/dL (ref 0.2–1.2)
Total Protein: 7.9 g/dL (ref 6.0–8.3)

## 2019-03-29 LAB — PROTIME-INR
INR: 5.3 ratio — ABNORMAL HIGH (ref 0.8–1.0)
Prothrombin Time: 58.5 s (ref 9.6–13.1)

## 2019-03-29 LAB — TSH: TSH: 1.49 u[IU]/mL (ref 0.35–4.50)

## 2019-03-29 LAB — PSA: PSA: 0.85 ng/mL (ref 0.10–4.00)

## 2019-03-29 LAB — VITAMIN B12: Vitamin B-12: 498 pg/mL (ref 211–911)

## 2019-03-29 LAB — HEMOGLOBIN A1C: Hgb A1c MFr Bld: 5.9 % (ref 4.6–6.5)

## 2019-03-29 LAB — VITAMIN D 25 HYDROXY (VIT D DEFICIENCY, FRACTURES): VITD: 33.24 ng/mL (ref 30.00–100.00)

## 2019-03-29 NOTE — Patient Instructions (Signed)

## 2019-03-29 NOTE — Assessment & Plan Note (Signed)
For temovate prn

## 2019-03-29 NOTE — Assessment & Plan Note (Signed)

## 2019-03-29 NOTE — Telephone Encounter (Signed)
Patient has been given new dosing instructions and he did verbalize understanding.  See anticoagulation note for 3/17.

## 2019-03-29 NOTE — Telephone Encounter (Signed)
Critical Values for Protime is 58.5 and INR is 5.32.    Received this information from Santiago Glad at the lab

## 2019-03-29 NOTE — Assessment & Plan Note (Signed)
For INR today, may need sooner f/u with coumadin clinic

## 2019-03-29 NOTE — Telephone Encounter (Signed)
Agree; to hold coumadin Friday and Saturday  Cindy, can you make further recommendation?  thanks

## 2019-03-29 NOTE — Telephone Encounter (Signed)
Per Dr. Jenny Reichmann called patient informed him not to take Coumadin tomorrow and Friday.   Patient has already taken this coumadin for today.   Will forward this information to C. Boyd at the Coumadin Clinic

## 2019-03-29 NOTE — Progress Notes (Signed)
Subjective:    Patient ID: [name], male    DOB: February 21, 1950, 69 y.o.   MRN: [id number]  HPI  Here for wellness and f/u;  Overall doing ok;  Pt denies Chest pain, worsening SOB, DOE, wheezing, orthopnea, PND, worsening LE edema, palpitations, dizziness or syncope.  Pt denies neurological change such as new headache, facial or extremity weakness.  Pt denies polydipsia, polyuria, or low sugar symptoms. Pt states overall good compliance with treatment and medications, good tolerability, and has been trying to follow appropriate diet.  Pt denies worsening depressive symptoms, suicidal ideation or panic. No fever, night sweats, wt loss, loss of appetite, or other constitutional symptoms.  Pt states good ability with ADL's, has low fall risk, home safety reviewed and adequate, no other significant changes in hearing or vision, and only occasionally active with exercise. Does have unusual bleeding to arms today out of proportion to taking coumadin it seems. Asks for change of steroid cream as triam not working for psoriasis Past Medical History:  Diagnosis Date  . AAA (abdominal aortic aneurysm) [facility]) July 2013   Abdominal Aortic Endovascular Stent Graft 08/11/11. Bilateral. Right is 3.2 & the left is 3.6. "Preclose" repair bilateral common femoral artery. Placement of catheter in aorta x 2. Repair of aorta w/modular bifurcated prosthesis w/bilateral limbs.  Placement of right limb extension: 23 mm x 12 cm. Placement of left limb extension: 14 mm x 7 cm.  . Anemia    From GI bleed in 05/11/11.   [name] Chronic anticoagulation, PE DVT PAF   . Chronic pain   . Depression   . Dyslipidemia   . Edema of left lower extremity    S/P I&D 08/30/06  . GI bleed 04/2011   With anemia. EGD & colonoscopy 05/11/2011  . History of alcohol dependence ([place])   . HTN (hypertension)   . Hyperlipemia   . Hypertension   . Iliac artery aneurysm, bilateral (HCC)    PVD, bilat common iliac aneurysms [443.9]  . Iliopsoas  muscle hematoma 04/2011  . Lung nodule 2008  . Morbid obesity ([place])   . Morbid obesity with BMI of 40.0-44.9, adult ([place])   . Obstructive sleep apnea July 2013   Not wearing CPAP. Split-Night sleep study 06/03/11 AHI = 27.43/hr (TOTAL SLEEP TIME 105 minutes) and REM AHI = 120.00/hr  . Osteoarthritis of both knees    Bilateral Arthroscopy in 2000 & 2008. Both were complicated w/venous thromboembolism S/P IVC filter placement due to one being massive PE in 2008.  [name] PAF (paroxysmal atrial fibrillation)/atrial flutter 02/28/2010   ECHO : normal LV size, moderate concentricLVH, normal EF, normal atrial sizes, RV was normal but previously dilated;;  Myoview : Normal perfusion study. Low risk scan.  [name] PAH (pulmonary artery hypertension) ([place])   . Prolonged QT interval   . Psoriasis   . Pulmonary embolism ([place]) 2008   DVT --> Massive PE with right-sided strain. On coumadin. ECHO 02/28/10 showed normal LV size, moderate concentricLVH, normal EF, normal atrial sizes, RV was normal but previously dilated.   Past Surgical History:  Procedure Laterality Date  . ABDOMINAL AORTAGRAM N/A 05/14/2011   Procedure: ABDOMINAL [name];  Surgeon: Conrad [place], MD;  Location: [facility] CATH LAB;  Service: Cardiovascular;  Laterality: N/A;  . Abdominal Aortic Evar  08/11/11   Bilateral. Right is 3.2 & the left is 3.6. "Preclose" repair bilateral common femoral artery. Placement of catheter in aorta x 2. Repair of aorta w/modular bifurcated prosthesis w/bilateral limbs.  Placement of right limb extension: 23 mm x 12 cm. Placement of left limb extension: 14 mm x 7 cm.  . ACHILLES TENDON SURGERY    . COLONOSCOPY  05/11/2011   For GI bleed. Performed by Dr. Juanita Craver, MD. Also had a EGD performed at this time.  . COLONOSCOPY  05/12/2011  . EMBOLIZATION N/A 07/30/2011   Procedure: EMBOLIZATION;  Surgeon: Conrad [place], MD;  Location: [facility] CATH LAB;  Service: Cardiovascular;  Laterality: N/A;  . ESOPHAGOGASTRODUODENOSCOPY  05/08/2011    For GI bleed/Anemia. Also had a colonoscopy at this time.  . INSERTION OF VENA CAVA FILTER N/A 05/14/2011   Procedure: INSERTION OF VENA CAVA FILTER;  Surgeon: Conrad [place]e, MD;  Location: [facility] CATH LAB;  Service: Cardiovascular;  Laterality: N/A;  . KNEE ARTHROSCOPY Bilateral 2000 & 2008   DJD knees. Both were complicated w/venous thromboembolism S/P IVC filter placement due to one being massive PE in 2008.  [name] KNEE SURGERY  1999  . LOWER EXTREMITY ANGIOGRAM N/A 05/14/2011   Procedure: LOWER EXTREMITY ANGIOGRAM;  Surgeon: Conrad [place], MD;  Location: [facility] CATH LAB;  Service: Cardiovascular;  Laterality: N/A;    reports that he quit smoking about 12 years ago. He has a 1.00 pack-year smoking history. He has never used smokeless tobacco. He reports current alcohol use of about 1.0 - 2.0 standard drinks of alcohol per week. He reports current drug use. family history includes Arthritis in his father and mother; CAD (age of onset: 32) in his father; Cancer in his brother and mother. Allergies  Allergen Reactions  . Crestor [Rosuvastatin] Swelling   Current Outpatient Medications on File Prior to Visit  Medication Sig Dispense Refill  . allopurinol (ZYLOPRIM) 100 MG tablet TAKE 1 TABLET BY MOUTH  DAILY 90 tablet 3  . atorvastatin (LIPITOR) 40 MG tablet TAKE 1 TABLET BY MOUTH  DAILY 90 tablet 3  . clotrimazole (LOTRIMIN) 1 % cream Apply topically 2 (two) times daily. 30 g 1  . desonide (DESOWEN) 0.05 % cream Apply 1 application topically daily. Apply to rash on face    . digoxin (LANOXIN) 0.125 MG tablet TAKE 1 TABLET BY MOUTH  DAILY 90 tablet 3  . diltiazem (TIAZAC) 360 MG 24 hr capsule TAKE 1 CAPSULE BY MOUTH  DAILY 90 capsule 2  . fluocinonide ointment (LIDEX) [id number] % Apply 1 application topically 2 (two) times daily. Apply to dry skin areas on body    . fluocinonide-emollient (LIDEX-E) 0.05 % cream Apply 1 application topically 2 (two) times daily. 454 g 0  . hydrochlorothiazide (MICROZIDE) 12.5 MG  capsule Take 1 capsule (12.5 mg total) by mouth daily. 90 capsule 3  . ketoconazole (NIZORAL) 200 MG tablet Take 1 tablet (200 mg total) by mouth daily. 7 tablet 0  . metoprolol tartrate (LOPRESSOR) 50 MG tablet TAKE 1 TABLET BY MOUTH  TWICE DAILY 180 tablet 3  . triamcinolone ointment (KENALOG) 0.5 % Apply 1 application topically 2 (two) times daily. 45 g 1  . VIAGRA 100 MG tablet TAKE 1/2 TO 1 (ONE-HALF TO ONE) TABLET BY MOUTH DAILY AS NEEDED FOR  ERECTILE  DYSFUNCTION 10 tablet 0  . Vitamin D, Ergocalciferol, (DRISDOL) 1.25 MG (50000 UT) CAPS capsule Take 1 capsule (50,000 Units total) by mouth every 7 (seven) days. 12 capsule 0  . warfarin (COUMADIN) 5 MG tablet TAKE 1 AND 1/2 TABLETS BY  MOUTH DAILY EXCEPT ON  MONDAYS AND THURSDAYS TAKE  1 TABLET BY MOUTH OR AS  DIRECTED BY COUMADIN CLINIC 120 tablet 0  . nystatin (MYCOSTATIN/NYSTOP) powder Use as directed to affected area daily as needed (Patient not taking: Reported on 03/29/2019) 60 g 3   No current facility-administered medications on file prior to visit.   Review of Systems All otherwise neg per pt     Objective:   Physical Exam BP 130/82   Pulse 84   Temp 98.1 F (36.7 C)   Ht 6' 1.5" (1.867 m)   Wt 300 lb (136.1 kg)   SpO2 98%   BMI 39.04 kg/m  VS noted,  Constitutional: Pt appears in NAD HENT: Head: NCAT.  Right Ear: External ear normal.  Left Ear: External ear normal.  Eyes: . Pupils are equal, round, and reactive to light. Conjunctivae and EOM are normal Nose: without d/c or deformity Neck: Neck supple. Gross normal ROM Cardiovascular: Normal rate and regular rhythm.   Pulmonary/Chest: Effort normal and breath sounds without rales or wheezing.  Abd:  Soft, NT, ND, + BS, no organomegaly Neurological: Pt is alert. At baseline orientation, motor grossly intact Skin: Skin is warm. No rashes, +psriati lesions, no LE edema but does have unusual muliple bruising to arms Psychiatric: Pt behavior is normal without  agitation  All otherwise neg per pt Lab Results  Component Value Date   WBC 4.7 03/29/2019   HGB 16.8 03/29/2019   HCT 50.8 03/29/2019   PLT 172.0 03/29/2019   GLUCOSE 83 03/29/2019   CHOL 136 03/29/2019   TRIG 119.0 03/29/2019   HDL 43.20 03/29/2019   LDLCALC 69 03/29/2019   ALT 19 03/29/2019   AST 24 03/29/2019   NA 138 03/29/2019   K 4.8 03/29/2019   CL 100 03/29/2019   CREATININE 1.38 03/29/2019   BUN 34 (H) 03/29/2019   CO2 29 03/29/2019   TSH 1.49 03/29/2019   PSA 0.85 03/29/2019   INR 5.3 (H) 03/29/2019   HGBA1C 5.9 03/29/2019      Assessment & Plan:

## 2019-03-29 NOTE — Assessment & Plan Note (Signed)
For oral replacement 

## 2019-03-29 NOTE — Assessment & Plan Note (Signed)
stable overall by history and exam, recent data reviewed with pt, and pt to continue medical treatment as before,  to f/u any worsening symptoms or concerns  

## 2019-03-29 NOTE — Patient Instructions (Addendum)
Pre visit review using our clinic review tool, if applicable. No additional management support is needed unless otherwise documented below in the visit note.  Skip coumadin Thursday, Friday and Saturday (patient took coumadin today already).  On Monday resume taking 1 tablet daily except 1 1/2 tablets on Mon Wed and Fridays.  Re-check in 1 week.  I asked patient to stop taking Ibuprofen and start using Tylenol instead.  Pt verbalized understanding.

## 2019-03-31 ENCOUNTER — Other Ambulatory Visit: Payer: Self-pay | Admitting: Internal Medicine

## 2019-03-31 ENCOUNTER — Encounter: Payer: Self-pay | Admitting: Internal Medicine

## 2019-03-31 DIAGNOSIS — R3129 Other microscopic hematuria: Secondary | ICD-10-CM

## 2019-04-06 ENCOUNTER — Other Ambulatory Visit: Payer: Self-pay

## 2019-04-06 ENCOUNTER — Ambulatory Visit (INDEPENDENT_AMBULATORY_CARE_PROVIDER_SITE_OTHER): Payer: Medicare Other | Admitting: General Practice

## 2019-04-06 DIAGNOSIS — Z86718 Personal history of other venous thrombosis and embolism: Secondary | ICD-10-CM

## 2019-04-06 DIAGNOSIS — Z7901 Long term (current) use of anticoagulants: Secondary | ICD-10-CM | POA: Diagnosis not present

## 2019-04-06 LAB — POCT INR: INR: 2.6 (ref 2.0–3.0)

## 2019-04-06 NOTE — Progress Notes (Signed)
Medical screening examination/treatment/procedure(s) were performed by non-physician practitioner and as supervising physician I was immediately available for consultation/collaboration. I agree with above. Jamea Robicheaux, MD   

## 2019-04-06 NOTE — Patient Instructions (Addendum)
Pre visit review using our clinic review tool, if applicable. No additional management support is needed unless otherwise documented below in the visit note.  Continue to take 1 tablet daily except 1 1/2 tablets on Mon Wed and Fridays. Re-check in 4 weeks.

## 2019-04-18 ENCOUNTER — Ambulatory Visit: Payer: Medicare Other

## 2019-05-04 ENCOUNTER — Ambulatory Visit: Payer: Medicare Other

## 2019-05-11 ENCOUNTER — Ambulatory Visit: Payer: Medicare Other

## 2019-05-18 ENCOUNTER — Ambulatory Visit: Payer: Medicare Other | Admitting: General Practice

## 2019-05-18 NOTE — Patient Instructions (Signed)
Pre visit review using our clinic review tool, if applicable. No additional management support is needed unless otherwise documented below in the visit note. 

## 2019-05-18 NOTE — Progress Notes (Signed)
Medical screening examination/treatment/procedure(s) were performed by non-physician practitioner and as supervising physician I was immediately available for consultation/collaboration. I agree with above. Lovena Kluck, MD   

## 2019-05-19 NOTE — Progress Notes (Signed)
Patient ID: [name], male   DOB: April 19, 1950, 69 y.o.   MRN: [id number]  Jenny Reichmann - can this visit be closed as it wont go away on my desktop  thanks

## 2019-05-29 ENCOUNTER — Telehealth: Payer: Self-pay | Admitting: Internal Medicine

## 2019-05-29 NOTE — Telephone Encounter (Signed)
New message:   1.Medication Requested: VIAGRA 100 MG tablet 2. Pharmacy (Name, Street, [place]): [facility] ([place] [place] - [address] 3. On Med List: yes  4. Last Visit with PCP: 03/29/19  5. Next visit date with PCP: 09/29/19  Pt is requesting the generic   Agent: Please be advised that RX refills may take up to 3 business days. We ask that you follow-up with your pharmacy.

## 2019-05-31 ENCOUNTER — Other Ambulatory Visit: Payer: Self-pay

## 2019-05-31 MED ORDER — VIAGRA 100 MG PO TABS: ORAL_TABLET | ORAL | 0 refills | Status: DC

## 2019-06-01 ENCOUNTER — Other Ambulatory Visit: Payer: Self-pay

## 2019-06-01 DIAGNOSIS — Z7901 Long term (current) use of anticoagulants: Secondary | ICD-10-CM

## 2019-06-01 MED ORDER — WARFARIN SODIUM 5 MG PO TABS: ORAL_TABLET | ORAL | 0 refills | Status: DC

## 2019-06-01 NOTE — Telephone Encounter (Signed)
pls advise if ok to send generic.Marland KitchenJohny Chess

## 2019-06-01 NOTE — Telephone Encounter (Signed)
No need to send since by law pt can request generic at the pharmacy   If the pharmacy does not have the generic, he should change pharmacy

## 2019-06-01 NOTE — Telephone Encounter (Signed)
New message:   Pt is calling and states he only wants the generic of the viagra. He states it is too expensive to pay for the non-generic. Please advise.

## 2019-06-13 ENCOUNTER — Other Ambulatory Visit: Payer: Self-pay

## 2019-06-13 ENCOUNTER — Ambulatory Visit (INDEPENDENT_AMBULATORY_CARE_PROVIDER_SITE_OTHER): Payer: Medicare Other | Admitting: General Practice

## 2019-06-13 DIAGNOSIS — Z86718 Personal history of other venous thrombosis and embolism: Secondary | ICD-10-CM | POA: Diagnosis not present

## 2019-06-13 DIAGNOSIS — Z7901 Long term (current) use of anticoagulants: Secondary | ICD-10-CM | POA: Diagnosis not present

## 2019-06-13 LAB — POCT INR: INR: 2.3 (ref 2.0–3.0)

## 2019-06-13 NOTE — Patient Instructions (Addendum)
Pre visit review using our clinic review tool, if applicable. No additional management support is needed unless otherwise documented below in the visit note. Continue to take 1 tablet daily except 1 1/2 tablets on Mon Wed and Fridays. Re-check in 6 weeks.   

## 2019-07-20 NOTE — Progress Notes (Signed)
Medical screening examination/treatment/procedure(s) were performed by non-physician practitioner and as supervising physician I was immediately available for consultation/collaboration. I agree with above. Khalise Billard, MD   

## 2019-07-25 ENCOUNTER — Ambulatory Visit (INDEPENDENT_AMBULATORY_CARE_PROVIDER_SITE_OTHER): Payer: Medicare Other | Admitting: General Practice

## 2019-07-25 ENCOUNTER — Other Ambulatory Visit: Payer: Self-pay

## 2019-07-25 DIAGNOSIS — Z7901 Long term (current) use of anticoagulants: Secondary | ICD-10-CM

## 2019-07-25 DIAGNOSIS — Z86718 Personal history of other venous thrombosis and embolism: Secondary | ICD-10-CM | POA: Diagnosis not present

## 2019-07-25 LAB — POCT INR: INR: 2.7 (ref 2.0–3.0)

## 2019-07-25 NOTE — Patient Instructions (Addendum)
Pre visit review using our clinic review tool, if applicable. No additional management support is needed unless otherwise documented below in the visit note. Continue to take 1 tablet daily except 1 1/2 tablets on Mon Wed and Fridays. Re-check in 6 weeks.   

## 2019-07-25 NOTE — Progress Notes (Signed)
Medical screening examination/treatment/procedure(s) were performed by non-physician practitioner and as supervising physician I was immediately available for consultation/collaboration. I agree with above. Toshiye Kever, MD   

## 2019-07-31 ENCOUNTER — Other Ambulatory Visit: Payer: Self-pay | Admitting: Internal Medicine

## 2019-08-02 ENCOUNTER — Other Ambulatory Visit: Payer: Self-pay | Admitting: Internal Medicine

## 2019-08-02 DIAGNOSIS — Z7901 Long term (current) use of anticoagulants: Secondary | ICD-10-CM

## 2019-08-02 NOTE — Telephone Encounter (Signed)
Please ask pt to request refill from coumadin clinic provider

## 2019-08-07 DIAGNOSIS — R3121 Asymptomatic microscopic hematuria: Secondary | ICD-10-CM | POA: Diagnosis not present

## 2019-08-23 ENCOUNTER — Other Ambulatory Visit: Payer: Self-pay | Admitting: Internal Medicine

## 2019-08-23 NOTE — Telephone Encounter (Signed)
Please refill as per office routine med refill policy (all routine meds refilled for 3 mo or monthly per pt preference up to one year from last visit, then month to month grace period for 3 mo, then further med refills will have to be denied)  

## 2019-09-05 ENCOUNTER — Other Ambulatory Visit: Payer: Self-pay

## 2019-09-05 ENCOUNTER — Ambulatory Visit (INDEPENDENT_AMBULATORY_CARE_PROVIDER_SITE_OTHER): Payer: Medicare Other | Admitting: General Practice

## 2019-09-05 DIAGNOSIS — Z86718 Personal history of other venous thrombosis and embolism: Secondary | ICD-10-CM

## 2019-09-05 DIAGNOSIS — Z7901 Long term (current) use of anticoagulants: Secondary | ICD-10-CM

## 2019-09-05 LAB — POCT INR: INR: 2.8 (ref 2.0–3.0)

## 2019-09-05 NOTE — Patient Instructions (Signed)
Pre visit review using our clinic review tool, if applicable. No additional management support is needed unless otherwise documented below in the visit note. Continue to take 1 tablet daily except 1 1/2 tablets on Mon Wed and Fridays. Re-check in 6 weeks.

## 2019-09-06 NOTE — Progress Notes (Signed)
Medical screening examination/treatment/procedure(s) were performed by non-physician practitioner and as supervising physician I was immediately available for consultation/collaboration. I agree with above. Veatrice Eckstein, MD   

## 2019-09-29 ENCOUNTER — Encounter: Payer: Self-pay | Admitting: Internal Medicine

## 2019-09-29 ENCOUNTER — Ambulatory Visit (INDEPENDENT_AMBULATORY_CARE_PROVIDER_SITE_OTHER): Payer: Medicare Other | Admitting: Internal Medicine

## 2019-09-29 ENCOUNTER — Other Ambulatory Visit: Payer: Self-pay

## 2019-09-29 VITALS — BP 130/80 | HR 71 | Temp 97.9°F | Ht 73.5 in | Wt 283.0 lb

## 2019-09-29 DIAGNOSIS — Z23 Encounter for immunization: Secondary | ICD-10-CM | POA: Diagnosis not present

## 2019-09-29 DIAGNOSIS — R3129 Other microscopic hematuria: Secondary | ICD-10-CM | POA: Diagnosis not present

## 2019-09-29 DIAGNOSIS — R739 Hyperglycemia, unspecified: Secondary | ICD-10-CM

## 2019-09-29 DIAGNOSIS — E785 Hyperlipidemia, unspecified: Secondary | ICD-10-CM

## 2019-09-29 DIAGNOSIS — I10 Essential (primary) hypertension: Secondary | ICD-10-CM | POA: Diagnosis not present

## 2019-09-29 NOTE — Progress Notes (Signed)
Subjective:    Patient ID: [name], male    DOB: 11/13/1950, 69 y.o.   MRN: [id number]  HPI  Here to f/u; overall doing ok,  Pt denies chest pain, increasing sob or doe, wheezing, orthopnea, PND, increased LE swelling, palpitations, dizziness or syncope.  Pt denies new neurological symptoms such as new headache, or facial or extremity weakness or numbness.  Pt denies polydipsia, polyuria, or low sugar episode.  Pt states overall good compliance with meds, mostly trying to follow appropriate diet, with wt overall stable,  but little exercise however.  Good success with intentional wt loss now about 30 lbs.  Wt Readings from Last 3 Encounters:  09/29/19 283 lb (128.4 kg)  03/29/19 300 lb (136.1 kg)  11/15/18 (!) 315 lb (142.9 kg)  Denies urinary symptoms such as dysuria, frequency, urgency, flank pain, hematuria or n/v, fever, chills.  Did not f/u urology with ct urogram or cystoscopy Past Medical History:  Diagnosis Date  . AAA (abdominal aortic aneurysm) [facility]) July 2013   Abdominal Aortic Endovascular Stent Graft 08/11/11. Bilateral. Right is 3.2 & the left is 3.6. "Preclose" repair bilateral common femoral artery. Placement of catheter in aorta x 2. Repair of aorta w/modular bifurcated prosthesis w/bilateral limbs.  Placement of right limb extension: 23 mm x 12 cm. Placement of left limb extension: 14 mm x 7 cm.  . Anemia    From GI bleed in 05/11/11.   [name] Chronic anticoagulation, PE DVT PAF   . Chronic pain   . Depression   . Dyslipidemia   . Edema of left lower extremity    S/P I&D 08/30/06  . GI bleed 04/2011   With anemia. EGD & colonoscopy 05/11/2011  . History of alcohol dependence ([place])   . HTN (hypertension)   . Hyperlipemia   . Hypertension   . Iliac artery aneurysm, bilateral (HCC)    PVD, bilat common iliac aneurysms [443.9]  . Iliopsoas muscle hematoma 04/2011  . Lung nodule 2008  . Morbid obesity ([place])   . Morbid obesity with BMI of 40.0-44.9, adult ([place])   .  Obstructive sleep apnea July 2013   Not wearing CPAP. Split-Night sleep study 06/03/11 AHI = 27.43/hr (TOTAL SLEEP TIME 105 minutes) and REM AHI = 120.00/hr  . Osteoarthritis of both knees    Bilateral Arthroscopy in 2000 & 2008. Both were complicated w/venous thromboembolism S/P IVC filter placement due to one being massive PE in 2008.  [name] PAF (paroxysmal atrial fibrillation)/atrial flutter 02/28/2010   ECHO : normal LV size, moderate concentricLVH, normal EF, normal atrial sizes, RV was normal but previously dilated;;  Myoview : Normal perfusion study. Low risk scan.  [name] PAH (pulmonary artery hypertension) ([place])   . Prolonged QT interval   . Psoriasis   . Pulmonary embolism ([place]) 2008   DVT --> Massive PE with right-sided strain. On coumadin. ECHO 02/28/10 showed normal LV size, moderate concentricLVH, normal EF, normal atrial sizes, RV was normal but previously dilated.   Past Surgical History:  Procedure Laterality Date  . ABDOMINAL AORTAGRAM N/A 05/14/2011   Procedure: ABDOMINAL [name];  Surgeon: Conrad [place], MD;  Location: [facility] CATH LAB;  Service: Cardiovascular;  Laterality: N/A;  . Abdominal Aortic Evar  08/11/11   Bilateral. Right is 3.2 & the left is 3.6. "Preclose" repair bilateral common femoral artery. Placement of catheter in aorta x 2. Repair of aorta w/modular bifurcated prosthesis w/bilateral limbs.  Placement of right limb extension: 23 mm x 12 cm.  Placement of left limb extension: 14 mm x 7 cm.  . ACHILLES TENDON SURGERY    . COLONOSCOPY  05/11/2011   For GI bleed. Performed by Dr. Juanita Craver, MD. Also had a EGD performed at this time.  . COLONOSCOPY  05/12/2011  . EMBOLIZATION N/A 07/30/2011   Procedure: EMBOLIZATION;  Surgeon: Conrad [place], MD;  Location[facility] CATH LAB;  Service: Cardiovascular;  Laterality: N/A;  . ESOPHAGOGASTRODUODENOSCOPY  05/08/2011   For GI bleed/Anemia. Also had a colonoscopy at this time.  . INSERTION OF VENA CAVA FILTER N/A 05/14/2011   Procedure:  INSERTION OF VENA CAVA FILTER;  Surgeon: Conrad [place], MD;  Location: [facility] CATH LAB;  Service: Cardiovascular;  Laterality: N/A;  . KNEE ARTHROSCOPY Bilateral 2000 & 2008   DJD knees. Both were complicated w/venous thromboembolism S/P IVC filter placement due to one being massive PE in 2008.  [name] KNEE SURGERY  1999  . LOWER EXTREMITY ANGIOGRAM N/A 05/14/2011   Procedure: LOWER EXTREMITY ANGIOGRAM;  Surgeon: Conrad [place], MD;  Location: [facility] CATH LAB;  Service: Cardiovascular;  Laterality: N/A;    reports that he quit smoking about 13 years ago. He has a 1.00 pack-year smoking history. He has never used smokeless tobacco. He reports current alcohol use of about 1.0 - 2.0 standard drink of alcohol per week. He reports current drug use. family history includes Arthritis in his father and mother; CAD (age of onset: 10) in his father; Cancer in his brother and mother. Allergies  Allergen Reactions  . Crestor [Rosuvastatin] Swelling   Current Outpatient Medications on File Prior to Visit  Medication Sig Dispense Refill  . allopurinol (ZYLOPRIM) 100 MG tablet TAKE 1 TABLET BY MOUTH  DAILY 90 tablet 3  . atorvastatin (LIPITOR) 40 MG tablet TAKE 1 TABLET BY MOUTH  DAILY 90 tablet 3  . clotrimazole (LOTRIMIN) 1 % cream Apply topically 2 (two) times daily. 30 g 1  . desonide (DESOWEN) 0.05 % cream Apply 1 application topically daily. Apply to rash on face    . digoxin (LANOXIN) 0.125 MG tablet TAKE 1 TABLET BY MOUTH  DAILY 90 tablet 3  . diltiazem (TIAZAC) 360 MG 24 hr capsule TAKE 1 CAPSULE BY MOUTH  DAILY 90 capsule 2  . fluocinonide ointment (LIDEX) 9.73 % Apply 1 application topically 2 (two) times daily. Apply to dry skin areas on body    . fluocinonide-emollient (LIDEX-E) 0.05 % cream Apply 1 application topically 2 (two) times daily. 454 g 0  . hydrochlorothiazide (MICROZIDE) 12.5 MG capsule TAKE 1 CAPSULE BY MOUTH  DAILY 90 capsule 3  . ketoconazole (NIZORAL) 200 MG tablet Take 1 tablet (200 mg total)  by mouth daily. 7 tablet 0  . metoprolol tartrate (LOPRESSOR) 50 MG tablet TAKE 1 TABLET BY MOUTH  TWICE DAILY 180 tablet 3  . triamcinolone ointment (KENALOG) 0.5 % Apply 1 application topically 2 (two) times daily. 45 g 1  . VIAGRA 100 MG tablet TAKE 1/2 TO 1 (ONE-HALF TO ONE) TABLET BY MOUTH DAILY AS NEEDED FOR  ERECTILE  DYSFUNCTION 10 tablet 0  . Vitamin D, Ergocalciferol, (DRISDOL) 1.25 MG (50000 UT) CAPS capsule Take 1 capsule (50,000 Units total) by mouth every 7 (seven) days. 12 capsule 0  . warfarin (COUMADIN) 5 MG tablet Take 1 tablet daily except take 1 1/2 tablets on Mon Wed and Fri or TAKE AS DIRECTED BY ANTICOAGULATION CLINIC 120 tablet 1   No current facility-administered medications on file prior to visit.  Review of Systems All otherwise neg per pt    Objective:   Physical Exam BP 130/80 (BP Location: Left Arm, Patient Position: Sitting, Cuff Size: Large)   Pulse 71   Temp 97.9 F (36.6 C) (Oral)   Ht 6' 1.5" (1.867 m)   Wt 283 lb (128.4 kg)   SpO2 96%   BMI 36.83 kg/m  VS noted,  Constitutional: Pt appears in NAD HENT: Head: NCAT.  Right Ear: External ear normal.  Left Ear: External ear normal.  Eyes: . Pupils are equal, round, and reactive to light. Conjunctivae and EOM are normal Nose: without d/c or deformity Neck: Neck supple. Gross normal ROM Cardiovascular: Normal rate and regular rhythm.   Pulmonary/Chest: Effort normal and breath sounds without rales or wheezing.  Abd:  Soft, NT, ND, + BS, no organomegaly Neurological: Pt is alert. At baseline orientation, motor grossly intact Skin: Skin is warm. No rashes, other new lesions, no LE edema Psychiatric: Pt behavior is normal without agitation  All otherwise neg per pt Lab Results  Component Value Date   WBC 4.7 03/29/2019   HGB 16.8 03/29/2019   HCT 50.8 03/29/2019   PLT 172.0 03/29/2019   GLUCOSE 83 03/29/2019   CHOL 136 03/29/2019   TRIG 119.0 03/29/2019   HDL 43.20 03/29/2019   LDLCALC 69  03/29/2019   ALT 19 03/29/2019   AST 24 03/29/2019   NA 138 03/29/2019   K 4.8 03/29/2019   CL 100 03/29/2019   CREATININE 1.38 03/29/2019   BUN 34 (H) 03/29/2019   CO2 29 03/29/2019   TSH 1.49 03/29/2019   PSA 0.85 03/29/2019   INR 2.8 09/05/2019   HGBA1C 5.9 03/29/2019      Assessment & Plan:

## 2019-09-29 NOTE — Patient Instructions (Signed)
This office will let the urology know that you are ok with the CT scan and the cystoscopy testing, and hopefully they will call you  Please continue all other medications as before, and refills have been done if requested.  Please have the pharmacy call with any other refills you may need.  Please continue your efforts at being more active, low cholesterol diet, and weight control.  Please keep your appointments with your specialists as you may have planned  Please make an Appointment to return in 6 months, or sooner if needed

## 2019-09-30 ENCOUNTER — Encounter: Payer: Self-pay | Admitting: Internal Medicine

## 2019-09-30 NOTE — Assessment & Plan Note (Addendum)

## 2019-09-30 NOTE — Assessment & Plan Note (Signed)
Pt now agrees to f/u with urology, will help to asssit with f/u appt

## 2019-09-30 NOTE — Assessment & Plan Note (Signed)
stable overall by history and exam, recent data reviewed with pt, and pt to continue medical treatment as before,  to f/u any worsening symptoms or concerns  

## 2019-10-12 ENCOUNTER — Ambulatory Visit (INDEPENDENT_AMBULATORY_CARE_PROVIDER_SITE_OTHER): Payer: Medicare Other

## 2019-10-12 DIAGNOSIS — Z0000 Encounter for general adult medical examination without abnormal findings: Secondary | ICD-10-CM

## 2019-10-12 NOTE — Progress Notes (Signed)
I connected with [name] today by telephone and verified that I am speaking with the correct person using two identifiers. Location patient: home Location provider: work Persons participating in the virtual visit: [name] and Lisette Abu, LPN..   I discussed the limitations, risks, security and privacy concerns of performing an evaluation and management service by telephone and the availability of in person appointments. I also discussed with the patient that there may be a patient responsible charge related to this service. The patient expressed understanding and verbally consented to this telephonic visit.    Interactive audio and video telecommunications were attempted between this provider and patient, however failed, due to patient having technical difficulties OR patient did not have access to video capability.  We continued and completed visit with audio only.  Some vital signs may be absent or patient reported.   Time Spent with patient on telephone encounter: 20 minutes   Subjective:   [name] is a 69 y.o. male who presents for Medicare Annual/Subsequent preventive examination.  Review of Systems    No Ros. Medicare Wellness Visit. Cardiac Risk Factors include: advanced age (>35men, >81 women);dyslipidemia;family history of premature cardiovascular disease;hypertension;male gender;obesity (BMI >30kg/m2) Sleep Patterns: No sleep issues, feels rested on waking and sleeps 7 hours nightly. Home Safety/Smoke Alarms: Feels safe in home; uses home alarm. Smoke alarms in place. Living environment: 1-story home; Lives alone;  no need for DME; good support system. Seat Belt Safety/Bike Helmet: Wears seat belt.    Objective:    There were no vitals filed for this visit. There is no height or weight on file to calculate BMI.  Advanced Directives 10/12/2019 08/14/2016 11/01/2015 10/26/2014 10/20/2013 08/11/2011 08/06/2011  Does Patient Have a Medical Advance Directive? Yes  No No No No Patient does not have advance directive Patient does not have advance directive  Type of Advance Directive [place];Living will - - - - - -  Does patient want to make changes to medical advance directive? No - Patient declined - - - - - -  Copy of [place] in Chart? No - copy requested - - - - - -  Would patient like information on creating a medical advance directive? - No - Patient declined No - patient declined information No - patient declined information No - patient declined information - -  Pre-existing out of facility DNR order (yellow form or pink MOST form) - - - - - No -    Current Medications (verified) Outpatient Encounter Medications as of 10/12/2019  Medication Sig  . allopurinol (ZYLOPRIM) 100 MG tablet TAKE 1 TABLET BY MOUTH  DAILY  . atorvastatin (LIPITOR) 40 MG tablet TAKE 1 TABLET BY MOUTH  DAILY  . clotrimazole (LOTRIMIN) 1 % cream Apply topically 2 (two) times daily.  [name] desonide (DESOWEN) 0.05 % cream Apply 1 application topically daily. Apply to rash on face  . digoxin (LANOXIN) 0.125 MG tablet TAKE 1 TABLET BY MOUTH  DAILY  . diltiazem (TIAZAC) 360 MG 24 hr capsule TAKE 1 CAPSULE BY MOUTH  DAILY  . fluocinonide ointment (LIDEX) 3.66 % Apply 1 application topically 2 (two) times daily. Apply to dry skin areas on body  . fluocinonide-emollient (LIDEX-E) 0.05 % cream Apply 1 application topically 2 (two) times daily.  . hydrochlorothiazide (MICROZIDE) 12.5 MG capsule TAKE 1 CAPSULE BY MOUTH  DAILY  . ketoconazole (NIZORAL) 200 MG tablet Take 1 tablet (200 mg total) by mouth daily.  . metoprolol  tartrate (LOPRESSOR) 50 MG tablet TAKE 1 TABLET BY MOUTH  TWICE DAILY  . triamcinolone ointment (KENALOG) 0.5 % Apply 1 application topically 2 (two) times daily.  [name] VIAGRA 100 MG tablet TAKE 1/2 TO 1 (ONE-HALF TO ONE) TABLET BY MOUTH DAILY AS NEEDED FOR  ERECTILE  DYSFUNCTION  . Vitamin D, Ergocalciferol, (DRISDOL) 1.25 MG (50000  UT) CAPS capsule Take 1 capsule (50,000 Units total) by mouth every 7 (seven) days.  [name] warfarin (COUMADIN) 5 MG tablet Take 1 tablet daily except take 1 1/2 tablets on Mon Wed and Fri or TAKE AS DIRECTED BY ANTICOAGULATION CLINIC   No facility-administered encounter medications on file as of 10/12/2019.    Allergies (verified) Crestor [rosuvastatin]   History: Past Medical History:  Diagnosis Date  . AAA (abdominal aortic aneurysm) [facility]) July 2013   Abdominal Aortic Endovascular Stent Graft 08/11/11. Bilateral. Right is 3.2 & the left is 3.6. "Preclose" repair bilateral common femoral artery. Placement of catheter in aorta x 2. Repair of aorta w/modular bifurcated prosthesis w/bilateral limbs.  Placement of right limb extension: 23 mm x 12 cm. Placement of left limb extension: 14 mm x 7 cm.  . Anemia    From GI bleed in 05/11/11.   [name] Chronic anticoagulation, PE DVT PAF   . Chronic pain   . Depression   . Dyslipidemia   . Edema of left lower extremity    S/P I&D 08/30/06  . GI bleed 04/2011   With anemia. EGD & colonoscopy 05/11/2011  . History of alcohol dependence ([place])   . HTN (hypertension)   . Hyperlipemia   . Hypertension   . Iliac artery aneurysm, bilateral (HCC)    PVD, bilat common iliac aneurysms [443.9]  . Iliopsoas muscle hematoma 04/2011  . Lung nodule 2008  . Morbid obesity ([place])   . Morbid obesity with BMI of 40.0-44.9, adult ([place])   . Obstructive sleep apnea July 2013   Not wearing CPAP. Split-Night sleep study 06/03/11 AHI = 27.43/hr (TOTAL SLEEP TIME 105 minutes) and REM AHI = 120.00/hr  . Osteoarthritis of both knees    Bilateral Arthroscopy in 2000 & 2008. Both were complicated w/venous thromboembolism S/P IVC filter placement due to one being massive PE in 2008.  [name] PAF (paroxysmal atrial fibrillation)/atrial flutter 02/28/2010   ECHO : normal LV size, moderate concentricLVH, normal EF, normal atrial sizes, RV was normal but previously dilated;;  Myoview : Normal  perfusion study. Low risk scan.  [name] PAH (pulmonary artery hypertension) ([place])   . Prolonged QT interval   . Psoriasis   . Pulmonary embolism ([place]) 2008   DVT --> Massive PE with right-sided strain. On coumadin. ECHO 02/28/10 showed normal LV size, moderate concentricLVH, normal EF, normal atrial sizes, RV was normal but previously dilated.   Past Surgical History:  Procedure Laterality Date  . ABDOMINAL AORTAGRAM N/A 05/14/2011   Procedure: ABDOMINAL [name];  Surgeon: Conrad Colman, MD;  Location[facility] CATH LAB;  Service: Cardiovascular;  Laterality: N/A;  . Abdominal Aortic Evar  08/11/11   Bilateral. Right is 3.2 & the left is 3.6. "Preclose" repair bilateral common femoral artery. Placement of catheter in aorta x 2. Repair of aorta w/modular bifurcated prosthesis w/bilateral limbs.  Placement of right limb extension: 23 mm x 12 cm. Placement of left limb extension: 14 mm x 7 cm.  . ACHILLES TENDON SURGERY    . COLONOSCOPY  05/11/2011   For GI bleed. Performed by Dr. Juanita Craver, MD. Also had a  EGD performed at this time.  . COLONOSCOPY  05/12/2011  . EMBOLIZATION N/A 07/30/2011   Procedure: EMBOLIZATION;  Surgeon: Conrad Cisco, MD;  Location: [facility] CATH LAB;  Service: Cardiovascular;  Laterality: N/A;  . ESOPHAGOGASTRODUODENOSCOPY  05/08/2011   For GI bleed/Anemia. Also had a colonoscopy at this time.  . INSERTION OF VENA CAVA FILTER N/A 05/14/2011   Procedure: INSERTION OF VENA CAVA FILTER;  Surgeon: Conrad [place], MD;  Location[facility] CATH LAB;  Service: Cardiovascular;  Laterality: N/A;  . KNEE ARTHROSCOPY Bilateral 2000 & 2008   DJD knees. Both were complicated w/venous thromboembolism S/P IVC filter placement due to one being massive PE in 2008.  [name] KNEE SURGERY  1999  . LOWER EXTREMITY ANGIOGRAM N/A 05/14/2011   Procedure: LOWER EXTREMITY ANGIOGRAM;  Surgeon: Conrad [place], MD;  Location: [facility] CATH LAB;  Service: Cardiovascular;  Laterality: N/A;   Family History  Problem Relation Age of Onset  .  Cancer Mother        Pancreatic  . Arthritis Mother   . Arthritis Father   . CAD Father 15  . Cancer Brother        Colon  . Fainting Neg Hx    Social History   Socioeconomic History  . Marital status: Divorced    Spouse name: Not on file  . Number of children: 1  . Years of education: Hotel manager  . Highest education level: Not on file  Occupational History  . Occupation: Truck Estate manager/land agent: [employer]  Tobacco Use  . Smoking status: Former Smoker    Packs/day: 0.50    Years: 2.00    Pack years: 1.00    Quit date: 07/05/2006    Years since quitting: 13.2  . Smokeless tobacco: Never Used  Vaping Use  . Vaping Use: Never used  Substance and Sexual Activity  . Alcohol use: Yes    Alcohol/week: 1.0 - 2.0 standard drink    Types: 1 - 2 Shots of liquor per week  . Drug use: Yes  . Sexual activity: Yes  Other Topics Concern  . Not on file  Social History Narrative   He is a divorced father of one, grandfather 2. He tries to exercise, and had been doing it 6 days a week. He had a fall earlier last year inserted got out of the habit. He also working on his diet and sort of got out of that habit as well. He did quit drinking a few years ago and does not smoke.   Social Determinants of Health   Financial Resource Strain: Low Risk   . Difficulty of Paying Living Expenses: Not hard at all  Food Insecurity: No Food Insecurity  . Worried About Charity fundraiser in the Last Year: Never true  . Ran Out of Food in the Last Year: Never true  Transportation Needs: No Transportation Needs  . Lack of Transportation (Medical): No  . Lack of Transportation (Non-Medical): No  Physical Activity: Sufficiently Active  . Days of Exercise per Week: 7 days  . Minutes of Exercise per Session: 30 min  Stress: No Stress Concern Present  . Feeling of Stress : Not at all  Social Connections: Moderately Integrated  . Frequency of Communication with Friends and Family: More than three  times a week  . Frequency of Social Gatherings with Friends and Family: More than three times a week  . Attends Religious Services: More than 4 times per year  .  Active Member of Clubs or Organizations: Yes  . Attends Archivist Meetings: More than 4 times per year  . Marital Status: Divorced    Tobacco Counseling Counseling given: Not Answered   Clinical Intake:  Pre-visit preparation completed: Yes  Pain : No/denies pain     Nutritional Risks: None Diabetes: No  How often do you need to have someone help you when you read instructions, pamphlets, or other written materials from your doctor or pharmacy?: 1 - Never What is the last grade level you completed in school?: 2 years at [employer]  Diabetic? no  Interpreter Needed?: No  Information entered by :: Lisette Abu, LPN   Activities of Daily Living In your present state of health, do you have any difficulty performing the following activities: 10/12/2019 09/29/2019  Hearing? N N  Vision? N N  Difficulty concentrating or making decisions? N N  Walking or climbing stairs? N N  Dressing or bathing? N N  Doing errands, shopping? N N  Preparing Food and eating ? N -  Using the Toilet? N -  In the past six months, have you accidently leaked urine? N -  Do you have problems with loss of bowel control? N -  Managing your Medications? N -  Managing your Finances? N -  Housekeeping or managing your Housekeeping? N -  Some recent data might be hidden    Patient Care Team: Biagio Borg, MD as PCP - General (Internal Medicine)  Indicate any recent Medical Services you may have received from other than [facility] providers in the past year (date may be approximate).     Assessment:   This is a routine wellness examination for [name].  Hearing/Vision screen No exam data present  Dietary issues and exercise activities discussed: Current Exercise Habits: Home exercise routine, Type of exercise: Other - see comments  (uses a Therapist, nutritional everyday for 30 mins.), Time (Minutes): 30, Frequency (Times/Week): 7, Weekly Exercise (Minutes/Week): 210, Intensity: Moderate, Exercise limited by: None identified  Goals   None    Depression Screen PHQ 2/9 Scores 10/12/2019 08/22/2018 07/09/2017 09/09/2016 05/09/2014  PHQ - 2 Score 0 1 1 0 0  PHQ- 9 Score - - - 0 -    Fall Risk Fall Risk  10/12/2019 03/29/2019 03/29/2019 08/22/2018 07/09/2017  Falls in the past year? 1 0 0 0 No  Number falls in past yr: 0 - 0 - -  Injury with Fall? 0 - 0 - -  Risk for fall due to : Other (Comment) - - - -  Risk for fall due to: Comment accidently tripped; no injuries; no ER visit - - - -  Follow up Falls evaluation completed - - - -    Any stairs in or around the home? No  If so, are there any without handrails? No  Home free of loose throw rugs in walkways, pet beds, electrical cords, etc? Yes  Adequate lighting in your home to reduce risk of falls? Yes   ASSISTIVE DEVICES UTILIZED TO PREVENT FALLS:  Life alert? No  Use of a cane, walker or w/c? No  Grab bars in the bathroom? No  Shower chair or bench in shower? No  Elevated toilet seat or a handicapped toilet? No   TIMED UP AND GO:  Was the test performed? No .  Length of time to ambulate 10 feet: 0 sec.   Gait steady and fast without use of assistive device  Cognitive Function: Patient is cogitatively  intact.        Immunizations Immunization History  Administered Date(s) Administered  . Fluad Quad(high Dose 65+) 09/02/2018, 09/29/2019  . Influenza Split 11/03/2011  . Influenza, High Dose Seasonal PF 11/01/2014, 10/02/2015, 09/09/2016, 10/01/2017  . Influenza,inj,Quad PF,6+ Mos 10/18/2012, 09/26/2013  . PFIZER SARS-COV-2 Vaccination 02/03/2019, 02/24/2019  . Pneumococcal Conjugate-13 02/05/2016  . Pneumococcal Polysaccharide-23 07/09/2017  . Tdap 09/08/2011    TDAP status: Up to date Flu Vaccine status: Up to date Pneumococcal vaccine status: Up to  date Covid-19 vaccine status: Completed vaccines  Qualifies for Shingles Vaccine? Yes   Zostavax completed Yes   Shingrix Completed?: No.    Education has been provided regarding the importance of this vaccine. Patient has been advised to call insurance company to determine out of pocket expense if they have not yet received this vaccine. Advised may also receive vaccine at local pharmacy or Health Dept. Verbalized acceptance and understanding.  Screening Tests Health Maintenance  Topic Date Due  . COLONOSCOPY  05/11/2021  . TETANUS/TDAP  09/07/2021  . INFLUENZA VACCINE  Completed  . COVID-19 Vaccine  Completed  . Hepatitis C Screening  Completed  . PNA vac Low Risk Adult  Completed    Health Maintenance  There are no preventive care reminders to display for this patient.  Colorectal cancer screening: Completed 05/12/2011. Repeat every 10 years  Lung Cancer Screening: (Low Dose CT Chest recommended if Age 75-80 years, 30 pack-year currently smoking OR have quit w/in 15years.) does not qualify.   Lung Cancer Screening Referral: no  Additional Screening:  Hepatitis C Screening: does qualify; Completed yes  Vision Screening: Recommended annual ophthalmology exams for early detection of glaucoma and other disorders of the eye. Is the patient up to date with their annual eye exam?  No  Who is the provider or what is the name of the office in which the patient attends annual eye exams? Needs a referral to optometrist. If pt is not established with a provider, would they like to be referred to a provider to establish care? Yes .   Dental Screening: Recommended annual dental exams for proper oral hygiene  Community Resource Referral / Chronic Care Management: CRR required this visit?  Yes   CCM required this visit?  No      Plan:     I have personally reviewed and noted the following in the patient's chart:   . Medical and social history . Use of alcohol, tobacco or illicit  drugs  . Current medications and supplements . Functional ability and status . Nutritional status . Physical activity . Advanced directives . List of other physicians . Hospitalizations, surgeries, and ER visits in previous 12 months . Vitals . Screenings to include cognitive, depression, and falls . Referrals and appointments  In addition, I have reviewed and discussed with patient certain preventive protocols, quality metrics, and best practice recommendations. A written personalized care plan for preventive services as well as general preventive health recommendations were provided to patient.     Sheral Flow, LPN   10/03/1939   Nurse Notes:  Patient is cogitatively intact. There were no vitals filed for this visit. There is no height or weight on file to calculate BMI. Patient stated that he has no issues with gait or balance; does not use any assistive devices. Needs CRR referral for optometrist and dental care.

## 2019-10-12 NOTE — Patient Instructions (Addendum)
[name] , Thank you for taking time to come for your Medicare Wellness Visit. I appreciate your ongoing commitment to your health goals. Please review the following plan we discussed and let me know if I can assist you in the future.   Screening recommendations/referrals: Colonoscopy: 05/12/2011; de every 10 years (04/2021) Recommended yearly ophthalmology/optometry visit for glaucoma screening and checkup Recommended yearly dental visit for hygiene and checkup  Vaccinations: Influenza vaccine: 09/29/2019 Pneumococcal vaccine: completed Tdap vaccine: 09/08/2011; due every 3 years (08/2021) Shingles vaccine: never done; check with local pharmacy or Health Department   Covid-19: completed; will wait until end of October to get booster vaccine  Advanced directives: Please bring a copy of your health care power of attorney and living will to the office at your convenience.  Conditions/risks identified: Yes; Reviewed health maintenance screenings with patient today and relevant education, vaccines, and/or referrals were provided. Please continue to do your personal lifestyle choices by: daily care of teeth and gums, regular physical activity (goal should be 5 days a week for 30 minutes), eat a healthy diet, avoid tobacco and drug use, limiting any alcohol intake, taking a low-dose aspirin (if not allergic or have been advised by your provider otherwise) and taking vitamins and minerals as recommended by your provider. Continue doing brain stimulating activities (puzzles, reading, adult coloring books, staying active) to keep memory sharp. Continue to eat heart healthy diet (full of fruits, vegetables, whole grains, lean protein, water--limit salt, fat, and sugar intake) and increase physical activity as tolerated.  Next appointment: Please schedule your next Medicare Wellness Visit with your Nurse Health Advisor in 1 year by calling [phone].  Preventive Care 84 Years and Older, Male Preventive  care refers to lifestyle choices and visits with your health care provider that can promote health and wellness. What does preventive care include?  A yearly physical exam. This is also called an annual well check.  Dental exams once or twice a year.  Routine eye exams. Ask your health care provider how often you should have your eyes checked.  Personal lifestyle choices, including:  Daily care of your teeth and gums.  Regular physical activity.  Eating a healthy diet.  Avoiding tobacco and drug use.  Limiting alcohol use.  Practicing safe sex.  Taking low doses of aspirin every day.  Taking vitamin and mineral supplements as recommended by your health care provider. What happens during an annual well check? The services and screenings done by your health care provider during your annual well check will depend on your age, overall health, lifestyle risk factors, and family history of disease. Counseling  Your health care provider may ask you questions about your:  Alcohol use.  Tobacco use.  Drug use.  Emotional well-being.  Home and relationship well-being.  Sexual activity.  Eating habits.  History of falls.  Memory and ability to understand (cognition).  Work and work Statistician. Screening  You may have the following tests or measurements:  Height, weight, and BMI.  Blood pressure.  Lipid and cholesterol levels. These may be checked every 5 years, or more frequently if you are over 77 years old.  Skin check.  Lung cancer screening. You may have this screening every year starting at age 44 if you have a 30-pack-year history of smoking and currently smoke or have quit within the past 15 years.  Fecal occult blood test (FOBT) of the stool. You may have this test every year starting at age 11.  Flexible sigmoidoscopy or  colonoscopy. You may have a sigmoidoscopy every 5 years or a colonoscopy every 10 years starting at age 34.  Prostate cancer  screening. Recommendations will vary depending on your family history and other risks.  Hepatitis C blood test.  Hepatitis B blood test.  Sexually transmitted disease (STD) testing.  Diabetes screening. This is done by checking your blood sugar (glucose) after you have not eaten for a while (fasting). You may have this done every 1-3 years.  Abdominal aortic aneurysm (AAA) screening. You may need this if you are a current or former smoker.  Osteoporosis. You may be screened starting at age 3 if you are at high risk. Talk with your health care provider about your test results, treatment options, and if necessary, the need for more tests. Vaccines  Your health care provider may recommend certain vaccines, such as:  Influenza vaccine. This is recommended every year.  Tetanus, diphtheria, and acellular pertussis (Tdap, Td) vaccine. You may need a Td booster every 10 years.  Zoster vaccine. You may need this after age 18.  Pneumococcal 13-valent conjugate (PCV13) vaccine. One dose is recommended after age 4.  Pneumococcal polysaccharide (PPSV23) vaccine. One dose is recommended after age 91. Talk to your health care provider about which screenings and vaccines you need and how often you need them. This information is not intended to replace advice given to you by your health care provider. Make sure you discuss any questions you have with your health care provider. Document Released: 01/25/2015 Document Revised: 09/18/2015 Document Reviewed: 10/30/2014 Elsevier Interactive Patient Education  2017 [place] Prevention in the Home Falls can cause injuries. They can happen to people of all ages. There are many things you can do to make your home safe and to help prevent falls. What can I do on the outside of my home?  Regularly fix the edges of walkways and driveways and fix any cracks.  Remove anything that might make you trip as you walk through a door, such as a raised  step or threshold.  Trim any bushes or trees on the path to your home.  Use bright outdoor lighting.  Clear any walking paths of anything that might make someone trip, such as rocks or tools.  Regularly check to see if handrails are loose or broken. Make sure that both sides of any steps have handrails.  Any raised decks and porches should have guardrails on the edges.  Have any leaves, snow, or ice cleared regularly.  Use sand or salt on walking paths during winter.  Clean up any spills in your garage right away. This includes oil or grease spills. What can I do in the bathroom?  Use night lights.  Install grab bars by the toilet and in the tub and shower. Do not use towel bars as grab bars.  Use non-skid mats or decals in the tub or shower.  If you need to sit down in the shower, use a plastic, non-slip stool.  Keep the floor dry. Clean up any water that spills on the floor as soon as it happens.  Remove soap buildup in the tub or shower regularly.  Attach bath mats securely with double-sided non-slip rug tape.  Do not have throw rugs and other things on the floor that can make you trip. What can I do in the bedroom?  Use night lights.  Make sure that you have a light by your bed that is easy to reach.  Do not use any  sheets or blankets that are too big for your bed. They should not hang down onto the floor.  Have a firm chair that has side arms. You can use this for support while you get dressed.  Do not have throw rugs and other things on the floor that can make you trip. What can I do in the kitchen?  Clean up any spills right away.  Avoid walking on wet floors.  Keep items that you use a lot in easy-to-reach places.  If you need to reach something above you, use a strong step stool that has a grab bar.  Keep electrical cords out of the way.  Do not use floor polish or wax that makes floors slippery. If you must use wax, use non-skid floor wax.  Do not  have throw rugs and other things on the floor that can make you trip. What can I do with my stairs?  Do not leave any items on the stairs.  Make sure that there are handrails on both sides of the stairs and use them. Fix handrails that are broken or loose. Make sure that handrails are as long as the stairways.  Check any carpeting to make sure that it is firmly attached to the stairs. Fix any carpet that is loose or worn.  Avoid having throw rugs at the top or bottom of the stairs. If you do have throw rugs, attach them to the floor with carpet tape.  Make sure that you have a light switch at the top of the stairs and the bottom of the stairs. If you do not have them, ask someone to add them for you. What else can I do to help prevent falls?  Wear shoes that:  Do not have high heels.  Have rubber bottoms.  Are comfortable and fit you well.  Are closed at the toe. Do not wear sandals.  If you use a stepladder:  Make sure that it is fully opened. Do not climb a closed stepladder.  Make sure that both sides of the stepladder are locked into place.  Ask someone to hold it for you, if possible.  Clearly mark and make sure that you can see:  Any grab bars or handrails.  First and last steps.  Where the edge of each step is.  Use tools that help you move around (mobility aids) if they are needed. These include:  Canes.  Walkers.  Scooters.  Crutches.  Turn on the lights when you go into a dark area. Replace any light bulbs as soon as they burn out.  Set up your furniture so you have a clear path. Avoid moving your furniture around.  If any of your floors are uneven, fix them.  If there are any pets around you, be aware of where they are.  Review your medicines with your doctor. Some medicines can make you feel dizzy. This can increase your chance of falling. Ask your doctor what other things that you can do to help prevent falls. This information is not intended  to replace advice given to you by your health care provider. Make sure you discuss any questions you have with your health care provider. Document Released: 10/25/2008 Document Revised: 06/06/2015 Document Reviewed: 02/02/2014 Elsevier Interactive Patient Education  2017 [employer].

## 2019-10-17 ENCOUNTER — Ambulatory Visit (INDEPENDENT_AMBULATORY_CARE_PROVIDER_SITE_OTHER): Payer: Medicare Other | Admitting: General Practice

## 2019-10-17 ENCOUNTER — Other Ambulatory Visit: Payer: Self-pay

## 2019-10-17 ENCOUNTER — Ambulatory Visit: Payer: Medicare Other

## 2019-10-17 DIAGNOSIS — Z7901 Long term (current) use of anticoagulants: Secondary | ICD-10-CM

## 2019-10-17 DIAGNOSIS — Z86718 Personal history of other venous thrombosis and embolism: Secondary | ICD-10-CM

## 2019-10-17 LAB — POCT INR: INR: 3.6 — AB (ref 2.0–3.0)

## 2019-10-17 NOTE — Progress Notes (Signed)
Medical screening examination/treatment/procedure(s) were performed by non-physician practitioner and as supervising physician I was immediately available for consultation/collaboration. I agree with above. Teron Blais, MD   

## 2019-10-17 NOTE — Patient Instructions (Addendum)
Pre visit review using our clinic review tool, if applicable. No additional management support is needed unless otherwise documented below in the visit note.  Skip dosage today (10/5) and then change dosage and take 1 tablet daily except take 1 1/2 tablets on Monday and Fridays.  Re-check in 4 weeks.

## 2019-11-02 ENCOUNTER — Other Ambulatory Visit: Payer: Self-pay | Admitting: Internal Medicine

## 2019-11-02 NOTE — Telephone Encounter (Signed)
Please refill as per office routine med refill policy (all routine meds refilled for 3 mo or monthly per pt preference up to one year from last visit, then month to month grace period for 3 mo, then further med refills will have to be denied)  

## 2019-11-03 ENCOUNTER — Telehealth: Payer: Self-pay | Admitting: Internal Medicine

## 2019-11-03 NOTE — Telephone Encounter (Signed)
° °  SF 11/03/2019    Name: [name]    MRN: [id number]    DOB: [birth year]    AGE: 69 y.o.    GENDER: male    PCP Biagio Borg, MD.   Called [facility] at [phone]. Spoke with representative who stated that [facility] accepts [employer].   Called Mr. Vangorden to let him know that [facility] accepts [employer] and that he will need to give them a call to schedule an appointment and they will let him know exactly what his insurance will cover. Patient stated understanding. No additional needs at this time.   Closing referral pending any other needs of patient.  [place], Care Management Phone: [phone] Email: [email or web address]

## 2019-11-14 ENCOUNTER — Ambulatory Visit (INDEPENDENT_AMBULATORY_CARE_PROVIDER_SITE_OTHER): Payer: Medicare Other | Admitting: General Practice

## 2019-11-14 ENCOUNTER — Other Ambulatory Visit: Payer: Self-pay

## 2019-11-14 DIAGNOSIS — Z7901 Long term (current) use of anticoagulants: Secondary | ICD-10-CM | POA: Diagnosis not present

## 2019-11-14 DIAGNOSIS — Z86718 Personal history of other venous thrombosis and embolism: Secondary | ICD-10-CM | POA: Diagnosis not present

## 2019-11-14 LAB — POCT INR: INR: 3.8 — AB (ref 2.0–3.0)

## 2019-11-14 NOTE — Patient Instructions (Addendum)
Pre visit review using our clinic review tool, if applicable. No additional management support is needed unless otherwise documented below in the visit note.  Skip dosage today (11/2) and then change dosage and take 1 tablet daily except take 1 1/2 tablets on Mondays.  Re-check in 4 weeks.

## 2019-11-14 NOTE — Progress Notes (Signed)
Medical screening examination/treatment/procedure(s) were performed by non-physician practitioner and as supervising physician I was immediately available for consultation/collaboration. I agree with above. Jamia Hoban, MD   

## 2019-11-22 ENCOUNTER — Encounter: Payer: Self-pay | Admitting: Adult Health

## 2019-11-22 ENCOUNTER — Telehealth (INDEPENDENT_AMBULATORY_CARE_PROVIDER_SITE_OTHER): Payer: Medicare Other | Admitting: Adult Health

## 2019-11-22 DIAGNOSIS — N4889 Other specified disorders of penis: Secondary | ICD-10-CM | POA: Diagnosis not present

## 2019-11-22 DIAGNOSIS — J392 Other diseases of pharynx: Secondary | ICD-10-CM

## 2019-11-22 NOTE — Progress Notes (Signed)
Virtual Visit via Telephone Note  I connected with BRINSON TOZZI on 11/22/19 at  3:00 PM EST by telephone and verified that I am speaking with the correct person using two identifiers.   I discussed the limitations, risks, security and privacy concerns of performing an evaluation and management service by telephone and the availability of in person appointments. I also discussed with the patient that there may be a patient responsible charge related to this service. The patient expressed understanding and agreed to proceed.  Location patient: home Location provider: work or home office Participants present for the call: patient, provider Patient did not have a visit in the prior 7 days to address this/these issue(s).   History of Present Illness: 69 year old male who  has a past medical history of AAA (abdominal aortic aneurysm) ([place]) (July 2013), Anemia, Chronic anticoagulation, PE DVT PAF, Chronic pain, Depression, Dyslipidemia, Edema of left lower extremity, GI bleed (04/2011), History of alcohol dependence ([place]), HTN (hypertension), Hyperlipemia, Hypertension, Iliac artery aneurysm, bilateral ([place]), Iliopsoas muscle hematoma (04/2011), Lung nodule (2008), Morbid obesity ([place]), Morbid obesity with BMI of 40.0-44.9, adult [facility]), Obstructive sleep apnea (July 2013), Osteoarthritis of both knees, PAF (paroxysmal atrial fibrillation)/atrial flutter (02/28/2010), PAH (pulmonary artery hypertension) ([place]), Prolonged QT interval, Psoriasis, and Pulmonary embolism ([place]) (2008).  He is being evaluated today for 2 separate acute issues that started approximately 1 to 2 weeks ago.  His first issue is that of "throat irritation".  Denies pain or difficulty swallowing, sinus pain or pressure, fevers, chills, white coating on tongue or back of mouth. He has had a semiproductive cough that possibly could be worse in the morning when he wakes up.  He has used Chloraseptic which helps with his symptoms.  Does not  feel as though it is improving or getting any worse.  Second issue is that of "irritation on my penis".  Denies pain, dysuria, hematuria drainage from the tip of his penis, testicular discomfort, lesions, or any other penile issue.  Reports that his last sexual contact was about 30 days ago and believes that this was protected sex.  He would like to make sure he does not have a STD or urinary tract infection.   Observations/Objective: Patient sounds cheerful and well on the phone. I do not appreciate any SOB. Speech and thought processing are grossly intact. Patient reported vitals:  Assessment and Plan: 1. Throat irritation -It was hard to elicit information from the patient over the phone as he gave vague answers to questions.  -Possibly from postnasal drip.  Advised Flonase or Claritin.  Follow-up if no improvement in symptoms over the next 3 to 5 days  2. Irritation of penis  - Urinalysis with Reflex Microscopic; Future - Urine cytology ancillary only; Future  Follow Up Instructions:   I did not refer this patient for an OV in the next 24 hours for this/these issue(s).  I discussed the assessment and treatment plan with the patient. The patient was provided an opportunity to ask questions and all were answered. The patient agreed with the plan and demonstrated an understanding of the instructions.   The patient was advised to call back or seek an in-person evaluation if the symptoms worsen or if the condition fails to improve as anticipated.  I provided 12 minutes of non-face-to-face time during this encounter.   Dorothyann Peng, NP

## 2019-11-24 ENCOUNTER — Other Ambulatory Visit: Payer: Self-pay

## 2019-11-24 ENCOUNTER — Other Ambulatory Visit: Payer: Medicare Other

## 2019-11-24 ENCOUNTER — Other Ambulatory Visit (HOSPITAL_COMMUNITY)
Admission: RE | Admit: 2019-11-24 | Discharge: 2019-11-24 | Disposition: A | Discharge status: Home / Self Care | Payer: Medicare Other | Source: Ambulatory Visit | Attending: Adult Health | Admitting: Adult Health

## 2019-11-24 DIAGNOSIS — N4889 Other specified disorders of penis: Secondary | ICD-10-CM | POA: Insufficient documentation

## 2019-11-25 LAB — URINALYSIS, ROUTINE W REFLEX MICROSCOPIC
Bacteria, UA: NONE SEEN /HPF
Bilirubin Urine: NEGATIVE
Glucose, UA: NEGATIVE
Hgb urine dipstick: NEGATIVE
Hyaline Cast: NONE SEEN /LPF
Ketones, ur: NEGATIVE
Nitrite: NEGATIVE
RBC / HPF: NONE SEEN /HPF (ref 0–2)
Specific Gravity, Urine: 1.021 (ref 1.001–1.03)
pH: 6 (ref 5.0–8.0)

## 2019-11-28 LAB — URINE CYTOLOGY ANCILLARY ONLY
Candida Urine: NEGATIVE
Chlamydia: NEGATIVE
Comment: NEGATIVE
Comment: NEGATIVE
Comment: NORMAL
Neisseria Gonorrhea: NEGATIVE
Trichomonas: NEGATIVE

## 2019-12-12 ENCOUNTER — Other Ambulatory Visit: Payer: Self-pay

## 2019-12-12 ENCOUNTER — Ambulatory Visit: Payer: Medicare Other

## 2019-12-12 ENCOUNTER — Telehealth (INDEPENDENT_AMBULATORY_CARE_PROVIDER_SITE_OTHER): Payer: Medicare Other | Admitting: Family Medicine

## 2019-12-12 DIAGNOSIS — R0981 Nasal congestion: Secondary | ICD-10-CM

## 2019-12-12 MED ORDER — AMOXICILLIN-POT CLAVULANATE 875-125 MG PO TABS: 1.0000 | ORAL_TABLET | Freq: Two times a day (BID) | ORAL | 0 refills | Status: DC

## 2019-12-12 NOTE — Progress Notes (Signed)
Virtual Visit via Telephone Note  I connected with [name] on 12/12/19 at 10:20 AM EST by telephone and verified that I am speaking with the correct person using two identifiers.   I discussed the limitations, risks, security and privacy concerns of performing an evaluation and management service by telephone and the availability of in person appointments. I also discussed with the patient that there may be a patient responsible charge related to this service. The patient expressed understanding and agreed to proceed.  Location patient: home, [place] Location provider: work or home office Participants present for the call: patient, provider Patient did not have a visit with me in the prior 7 days to address this/these issue(s).   History of Present Illness:  Acute telemedicine visit for sinus issues and cough: -Onset: started about 2 weeks ago -Symptoms include:nasal sinus congestion, PND - white and thick, cough -Denies:fevers, SOB, wheezing, chest pain, malaise -Has tried: throat spray -Pertinent past medical history: denies any history of sinus infection, lung disease, allergies, sick contact -Pertinent medication allergies:crestor -COVID-19 vaccine status: fully vaccinated for flu and covid   Observations/Objective: Patient sounds cheerful and well on the phone. I do not appreciate any SOB. Speech and thought processing are grossly intact. Patient reported vitals:  Assessment and Plan:  Sinus congestion  -we discussed possible serious and likely etiologies, options for evaluation and workup, limitations of telemedicine visit vs in person visit, treatment, treatment risks and precautions. Pt prefers to treat via telemedicine empirically rather than in person at this moment.  Given duration of symptoms query bacterial sinusitis versus other.  He opted for empiric trial of Augmentin 875 twice daily for 7 days. He agreed to notify his Coumadin clinic of the new  medication. Work/School slipped offered:  declined Scheduled follow up with PCP offered: Agrees to follow-up if needed. Advised to seek prompt in person care if worsening, new symptoms arise, or if is not improving with treatment. Advised of options for inperson care in case PCP office not available. Did let the patient know that I only do telemedicine shifts for  on Tuesdays and Thursdays and advised a follow up visit with PCP or at an [facility] if has further questions or concerns.   Follow Up Instructions:  I did not refer this patient for an OV with me in the next 24 hours for this/these issue(s).  I discussed the assessment and treatment plan with the patient. The patient was provided an opportunity to ask questions and all were answered. The patient agreed with the plan and demonstrated an understanding of the instructions.   I spent 13 minutes on this encounter.   [name], DO

## 2019-12-12 NOTE — Patient Instructions (Signed)
-  I sent the medication(s) we discussed to your pharmacy: Meds ordered this encounter  Medications  . amoxicillin-clavulanate (AUGMENTIN) 875-125 MG tablet    Sig: Take 1 tablet by mouth 2 (two) times daily.    Dispense:  14 tablet    Refill:  0     I hope you are feeling better soon!  Seek in person care promptly if your symptoms worsen, new concerns arise or you are not improving with treatment.  It was nice to meet you today. I help [place] out with telemedicine visits on Tuesdays and Thursdays and am available for visits on those days. If you have any concerns or questions following this visit please schedule a follow up visit with your Primary Care doctor or seek care at a local urgent care clinic to avoid delays in care.

## 2019-12-19 ENCOUNTER — Ambulatory Visit: Payer: Medicare Other

## 2019-12-26 ENCOUNTER — Other Ambulatory Visit: Payer: Self-pay

## 2019-12-26 ENCOUNTER — Ambulatory Visit (INDEPENDENT_AMBULATORY_CARE_PROVIDER_SITE_OTHER): Payer: Medicare Other | Admitting: General Practice

## 2019-12-26 DIAGNOSIS — Z7901 Long term (current) use of anticoagulants: Secondary | ICD-10-CM | POA: Diagnosis not present

## 2019-12-26 DIAGNOSIS — Z86718 Personal history of other venous thrombosis and embolism: Secondary | ICD-10-CM

## 2019-12-26 LAB — POCT INR: INR: 5.1 — AB (ref 2.0–3.0)

## 2019-12-26 NOTE — Patient Instructions (Addendum)
Pre visit review using our clinic review tool, if applicable. No additional management support is needed unless otherwise documented below in the visit note.  Skip dosage Wed, Thurs and Friday.  On Saturday continue to take 1 tablet daily except 1 1/2 tablets on Monday.  Re-check in 1 week.

## 2019-12-26 NOTE — Progress Notes (Signed)
Medical screening examination/treatment/procedure(s) were performed by non-physician practitioner and as supervising physician I was immediately available for consultation/collaboration. I agree with above. Akera Snowberger, MD   

## 2020-01-02 ENCOUNTER — Ambulatory Visit (INDEPENDENT_AMBULATORY_CARE_PROVIDER_SITE_OTHER): Payer: Medicare Other | Admitting: General Practice

## 2020-01-02 ENCOUNTER — Other Ambulatory Visit: Payer: Self-pay

## 2020-01-02 DIAGNOSIS — Z86718 Personal history of other venous thrombosis and embolism: Secondary | ICD-10-CM | POA: Diagnosis not present

## 2020-01-02 DIAGNOSIS — Z7901 Long term (current) use of anticoagulants: Secondary | ICD-10-CM

## 2020-01-02 LAB — POCT INR: INR: 1.4 — AB (ref 2.0–3.0)

## 2020-01-02 NOTE — Progress Notes (Signed)
Medical screening examination/treatment/procedure(s) were performed by non-physician practitioner and as supervising physician I was immediately available for consultation/collaboration. I agree with above. Heriberto Stmartin, MD   

## 2020-01-02 NOTE — Patient Instructions (Addendum)
Pre visit review using our clinic review tool, if applicable. No additional management support is needed unless otherwise documented below in the visit note.  Take 1 1/2 tablets today and tomorrow (12/21 and 12/22) and then continue to take 1 tablet daily except 1 1/2 tablets on Monday.  Re-check in 3 weeks.

## 2020-01-16 ENCOUNTER — Other Ambulatory Visit: Payer: Self-pay | Admitting: Internal Medicine

## 2020-01-16 DIAGNOSIS — Z7901 Long term (current) use of anticoagulants: Secondary | ICD-10-CM

## 2020-01-16 NOTE — Telephone Encounter (Signed)
Please ask pt to contact the coumadin clinic for refills of coumadin

## 2020-01-25 ENCOUNTER — Other Ambulatory Visit: Payer: Self-pay

## 2020-01-25 ENCOUNTER — Ambulatory Visit (INDEPENDENT_AMBULATORY_CARE_PROVIDER_SITE_OTHER): Payer: Medicare Other | Admitting: General Practice

## 2020-01-25 DIAGNOSIS — Z86718 Personal history of other venous thrombosis and embolism: Secondary | ICD-10-CM | POA: Diagnosis not present

## 2020-01-25 DIAGNOSIS — Z7901 Long term (current) use of anticoagulants: Secondary | ICD-10-CM | POA: Diagnosis not present

## 2020-01-25 LAB — POCT INR: INR: 2.2 (ref 2.0–3.0)

## 2020-01-25 NOTE — Progress Notes (Signed)
Medical screening examination/treatment/procedure(s) were performed by non-physician practitioner and as supervising physician I was immediately available for consultation/collaboration. I agree with above. Yliana Gravois, MD   

## 2020-01-25 NOTE — Patient Instructions (Addendum)
Pre visit review using our clinic review tool, if applicable. No additional management support is needed unless otherwise documented below in the visit note.  Continue to take 1 tablet daily except 1 1/2 tablets on Monday.  Re-check in 4 weeks.

## 2020-02-22 ENCOUNTER — Ambulatory Visit (INDEPENDENT_AMBULATORY_CARE_PROVIDER_SITE_OTHER): Payer: Medicare Other | Admitting: General Practice

## 2020-02-22 ENCOUNTER — Other Ambulatory Visit: Payer: Self-pay

## 2020-02-22 DIAGNOSIS — Z86718 Personal history of other venous thrombosis and embolism: Secondary | ICD-10-CM

## 2020-02-22 DIAGNOSIS — Z7901 Long term (current) use of anticoagulants: Secondary | ICD-10-CM | POA: Diagnosis not present

## 2020-02-22 LAB — POCT INR: INR: 2.2 (ref 2.0–3.0)

## 2020-02-22 NOTE — Patient Instructions (Signed)
Pre visit review using our clinic review tool, if applicable. No additional management support is needed unless otherwise documented below in the visit note. 

## 2020-02-22 NOTE — Progress Notes (Signed)
Medical screening examination/treatment/procedure(s) were performed by non-physician practitioner and as supervising physician I was immediately available for consultation/collaboration. I agree with above. Pola Furno, MD   

## 2020-04-04 ENCOUNTER — Other Ambulatory Visit: Payer: Self-pay

## 2020-04-04 ENCOUNTER — Ambulatory Visit (INDEPENDENT_AMBULATORY_CARE_PROVIDER_SITE_OTHER): Payer: Medicare Other | Admitting: General Practice

## 2020-04-04 DIAGNOSIS — Z86718 Personal history of other venous thrombosis and embolism: Secondary | ICD-10-CM | POA: Diagnosis not present

## 2020-04-04 DIAGNOSIS — Z7901 Long term (current) use of anticoagulants: Secondary | ICD-10-CM

## 2020-04-04 LAB — POCT INR: INR: 2.3 (ref 2.0–3.0)

## 2020-04-04 NOTE — Progress Notes (Signed)
Medical screening examination/treatment/procedure(s) were performed by non-physician practitioner and as supervising physician I was immediately available for consultation/collaboration. I agree with above. Joell Buerger, MD   

## 2020-04-04 NOTE — Patient Instructions (Addendum)
Pre visit review using our clinic review tool, if applicable. No additional management support is needed unless otherwise documented below in the visit note.  Continue to take 1 tablet daily except 1 1/2 tablets on Monday.  Re-check in 4 to 6 weeks.

## 2020-05-16 ENCOUNTER — Other Ambulatory Visit: Payer: Self-pay

## 2020-05-16 ENCOUNTER — Ambulatory Visit (INDEPENDENT_AMBULATORY_CARE_PROVIDER_SITE_OTHER): Payer: Medicare Other | Admitting: General Practice

## 2020-05-16 DIAGNOSIS — Z7901 Long term (current) use of anticoagulants: Secondary | ICD-10-CM

## 2020-05-16 DIAGNOSIS — Z86718 Personal history of other venous thrombosis and embolism: Secondary | ICD-10-CM

## 2020-05-16 LAB — POCT INR: INR: 2.8 (ref 2.0–3.0)

## 2020-05-16 NOTE — Progress Notes (Signed)
Medical screening examination/treatment/procedure(s) were performed by non-physician practitioner and as supervising physician I was immediately available for consultation/collaboration. I agree with above. Nastasia Kage, MD   

## 2020-05-16 NOTE — Patient Instructions (Addendum)
Pre visit review using our clinic review tool, if applicable. No additional management support is needed unless otherwise documented below in the visit note.  Continue to take 1 tablet daily except 1 1/2 tablets on Monday.  Re-check in 4 to 6 weeks.    

## 2020-05-17 NOTE — Progress Notes (Signed)
Medical treatment/procedure(s) were performed by non-physician practitioner and as supervising physician I was immediately available for consultation/collaboration. I agree with above. Joncarlos Atkison A Tashaya Ancrum, MD  

## 2020-06-17 ENCOUNTER — Other Ambulatory Visit: Payer: Self-pay | Admitting: Internal Medicine

## 2020-06-17 NOTE — Telephone Encounter (Signed)
Please refill as per office routine med refill policy (all routine meds refilled for 3 mo or monthly per pt preference up to one year from last visit, then month to month grace period for 3 mo, then further med refills will have to be denied)  

## 2020-06-27 ENCOUNTER — Ambulatory Visit: Payer: Medicare Other

## 2020-07-11 ENCOUNTER — Other Ambulatory Visit: Payer: Self-pay

## 2020-07-11 ENCOUNTER — Ambulatory Visit (INDEPENDENT_AMBULATORY_CARE_PROVIDER_SITE_OTHER): Payer: Medicare Other | Admitting: General Practice

## 2020-07-11 DIAGNOSIS — Z86718 Personal history of other venous thrombosis and embolism: Secondary | ICD-10-CM | POA: Diagnosis not present

## 2020-07-11 DIAGNOSIS — Z7901 Long term (current) use of anticoagulants: Secondary | ICD-10-CM | POA: Diagnosis not present

## 2020-07-11 LAB — POCT INR: INR: 2.2 (ref 2.0–3.0)

## 2020-07-11 NOTE — Progress Notes (Signed)
Medical screening examination/treatment/procedure(s) were performed by non-physician practitioner and as supervising physician I was immediately available for consultation/collaboration. I agree with above. Anahli Arvanitis, MD   

## 2020-07-11 NOTE — Patient Instructions (Addendum)
Pre visit review using our clinic review tool, if applicable. No additional management support is needed unless otherwise documented below in the visit note.  Continue to take 1 tablet daily except 1 1/2 tablets on Monday.  Re-check in 4 to 6 weeks.

## 2020-07-30 DIAGNOSIS — L400 Psoriasis vulgaris: Secondary | ICD-10-CM | POA: Diagnosis not present

## 2020-08-08 ENCOUNTER — Other Ambulatory Visit: Payer: Self-pay

## 2020-08-08 ENCOUNTER — Emergency Department (HOSPITAL_COMMUNITY): Payer: Medicare Other

## 2020-08-08 ENCOUNTER — Ambulatory Visit (HOSPITAL_COMMUNITY)
Admission: EM | Admit: 2020-08-08 | Discharge: 2020-08-08 | Disposition: A | Discharge status: Nursing Home (Includes ICF) | Payer: Medicare Other | Attending: Internal Medicine | Admitting: Internal Medicine

## 2020-08-08 ENCOUNTER — Emergency Department (HOSPITAL_COMMUNITY)
Admission: EM | Admit: 2020-08-08 | Discharge: 2020-08-08 | Disposition: A | Discharge status: Home / Self Care | Payer: Medicare Other | Attending: Emergency Medicine | Admitting: Emergency Medicine

## 2020-08-08 ENCOUNTER — Encounter (HOSPITAL_COMMUNITY): Payer: Self-pay

## 2020-08-08 DIAGNOSIS — R079 Chest pain, unspecified: Secondary | ICD-10-CM | POA: Diagnosis not present

## 2020-08-08 DIAGNOSIS — I480 Paroxysmal atrial fibrillation: Secondary | ICD-10-CM | POA: Insufficient documentation

## 2020-08-08 DIAGNOSIS — R1030 Lower abdominal pain, unspecified: Secondary | ICD-10-CM

## 2020-08-08 DIAGNOSIS — Z79899 Other long term (current) drug therapy: Secondary | ICD-10-CM | POA: Diagnosis not present

## 2020-08-08 DIAGNOSIS — I10 Essential (primary) hypertension: Secondary | ICD-10-CM | POA: Diagnosis not present

## 2020-08-08 DIAGNOSIS — Z7901 Long term (current) use of anticoagulants: Secondary | ICD-10-CM | POA: Diagnosis not present

## 2020-08-08 DIAGNOSIS — N281 Cyst of kidney, acquired: Secondary | ICD-10-CM | POA: Diagnosis not present

## 2020-08-08 DIAGNOSIS — Z751 Person awaiting admission to adequate facility elsewhere: Secondary | ICD-10-CM

## 2020-08-08 DIAGNOSIS — R112 Nausea with vomiting, unspecified: Secondary | ICD-10-CM | POA: Insufficient documentation

## 2020-08-08 DIAGNOSIS — J9811 Atelectasis: Secondary | ICD-10-CM | POA: Diagnosis not present

## 2020-08-08 DIAGNOSIS — Z87891 Personal history of nicotine dependence: Secondary | ICD-10-CM | POA: Diagnosis not present

## 2020-08-08 LAB — I-STAT CHEM 8, ED
BUN: 9 mg/dL (ref 8–23)
Calcium, Ion: 1.13 mmol/L — ABNORMAL LOW (ref 1.15–1.40)
Chloride: 104 mmol/L (ref 98–111)
Creatinine, Ser: 0.7 mg/dL (ref 0.61–1.24)
Glucose, Bld: 117 mg/dL — ABNORMAL HIGH (ref 70–99)
HCT: 48 % (ref 39.0–52.0)
Hemoglobin: 16.3 g/dL (ref 13.0–17.0)
Potassium: 3.7 mmol/L (ref 3.5–5.1)
Sodium: 141 mmol/L (ref 135–145)
TCO2: 25 mmol/L (ref 22–32)

## 2020-08-08 LAB — CBC WITH DIFFERENTIAL/PLATELET
Abs Immature Granulocytes: 0.03 10*3/uL (ref 0.00–0.07)
Basophils Absolute: 0 10*3/uL (ref 0.0–0.1)
Basophils Relative: 0 %
Eosinophils Absolute: 0 10*3/uL (ref 0.0–0.5)
Eosinophils Relative: 0 %
HCT: 44.4 % (ref 39.0–52.0)
Hemoglobin: 15 g/dL (ref 13.0–17.0)
Immature Granulocytes: 1 %
Lymphocytes Relative: 14 %
Lymphs Abs: 0.8 10*3/uL (ref 0.7–4.0)
MCH: 32.1 pg (ref 26.0–34.0)
MCHC: 33.8 g/dL (ref 30.0–36.0)
MCV: 95.1 fL (ref 80.0–100.0)
Monocytes Absolute: 0.4 10*3/uL (ref 0.1–1.0)
Monocytes Relative: 7 %
Neutro Abs: 4.7 10*3/uL (ref 1.7–7.7)
Neutrophils Relative %: 78 %
Platelets: 191 10*3/uL (ref 150–400)
RBC: 4.67 MIL/uL (ref 4.22–5.81)
RDW: 15.7 % — ABNORMAL HIGH (ref 11.5–15.5)
WBC: 6 10*3/uL (ref 4.0–10.5)
nRBC: 0 % (ref 0.0–0.2)

## 2020-08-08 LAB — URINALYSIS, ROUTINE W REFLEX MICROSCOPIC
Bacteria, UA: NONE SEEN
Bilirubin Urine: NEGATIVE
Glucose, UA: NEGATIVE mg/dL
Hgb urine dipstick: NEGATIVE
Ketones, ur: NEGATIVE mg/dL
Leukocytes,Ua: NEGATIVE
Nitrite: NEGATIVE
Protein, ur: 30 mg/dL — AB
Specific Gravity, Urine: 1.012 (ref 1.005–1.030)
pH: 8 (ref 5.0–8.0)

## 2020-08-08 LAB — COMPREHENSIVE METABOLIC PANEL
ALT: 24 U/L (ref 0–44)
AST: 35 U/L (ref 15–41)
Albumin: 3.7 g/dL (ref 3.5–5.0)
Alkaline Phosphatase: 112 U/L (ref 38–126)
Anion gap: 12 (ref 5–15)
BUN: 9 mg/dL (ref 8–23)
CO2: 24 mmol/L (ref 22–32)
Calcium: 10.5 mg/dL — ABNORMAL HIGH (ref 8.9–10.3)
Chloride: 103 mmol/L (ref 98–111)
Creatinine, Ser: 1.03 mg/dL (ref 0.61–1.24)
GFR, Estimated: >60 mL/min (ref >60)
Glucose, Bld: 117 mg/dL — ABNORMAL HIGH (ref 70–99)
Potassium: 3.7 mmol/L (ref 3.5–5.1)
Sodium: 139 mmol/L (ref 135–145)
Total Bilirubin: 1.1 mg/dL (ref 0.3–1.2)
Total Protein: 8 g/dL (ref 6.5–8.1)

## 2020-08-08 LAB — LIPASE, BLOOD: Lipase: 24 U/L (ref 11–51)

## 2020-08-08 MED ORDER — FENTANYL CITRATE (PF) 100 MCG/2ML IJ SOLN
50.0000 ug | Freq: Once | INTRAMUSCULAR | Status: DC
  Filled 2020-08-08: qty 2

## 2020-08-08 MED ORDER — IOHEXOL 350 MG/ML SOLN
75.0000 mL | Freq: Once | INTRAVENOUS | Status: AC | PRN
  Administered 2020-08-08: 75 mL via INTRAVENOUS

## 2020-08-08 MED ORDER — FENTANYL CITRATE (PF) 100 MCG/2ML IJ SOLN
100.0000 ug | Freq: Once | INTRAMUSCULAR | Status: AC
  Administered 2020-08-08: 100 ug via INTRAVENOUS

## 2020-08-08 MED ORDER — FENTANYL CITRATE (PF) 100 MCG/2ML IJ SOLN
100.0000 ug | Freq: Once | INTRAMUSCULAR | Status: AC
Start: 2020-08-08 — End: 2020-08-08
  Administered 2020-08-08: 100 ug via INTRAVENOUS
  Filled 2020-08-08: qty 2

## 2020-08-08 MED ORDER — DICYCLOMINE HCL 20 MG PO TABS
20.0000 mg | ORAL_TABLET | Freq: Three times a day (TID) | ORAL | 0 refills | Status: AC | PRN
Start: 2020-08-08 — End: ?

## 2020-08-08 NOTE — ED Triage Notes (Signed)
Pt reports lower abdominal pain since last night. Denies dysuria, increased urinary frequency, diarrhea, fever.   Reports he feels better after vomiting when doing the intake.

## 2020-08-08 NOTE — ED Notes (Signed)
Report called to ED charge nurse.

## 2020-08-08 NOTE — ED Notes (Signed)
[facility] called for transport. 

## 2020-08-08 NOTE — ED Provider Notes (Signed)
[facility] EMERGENCY DEPARTMENT Provider Note   CSN: [id number] Arrival date & time: 08/08/20  1317     History Chief Complaint  Patient presents with   Atrial Fibrillation    [name] is a 70 y.o. male.  HPI     70 year old male with a history of AAA with endovascular stent graft in 2013, dyslipidemia, chronic anticoagulation, atrial fibrillation, pulmonary embolus on Coumadin, hypertension, history of alcohol dependence, GI bleed, who presents with concern for lower abdominal pain.  Transferred from urgent care with concern for atrial fibrillation with RVR.  Reports some abdominal pain last night but woke up this AM and was severe. Constant severe pain, difficult to describe quality--just a severe constant pain. Feels worse laying down and better sitting up.  Had nausea and a few episodes of emesis. No current nausea. No diarrhea or constipation. No dysuria or problems urinating.  No chest pain or dyspnea. No fevers.   Past Medical History:  Diagnosis Date   AAA (abdominal aortic aneurysm) [facility]) July 2013   Abdominal Aortic Endovascular Stent Graft 08/11/11. Bilateral. Right is 3.2 & the left is 3.6. "Preclose" repair bilateral common femoral artery. Placement of catheter in aorta x 2. Repair of aorta w/modular bifurcated prosthesis w/bilateral limbs.  Placement of right limb extension: 23 mm x 12 cm. Placement of left limb extension: 14 mm x 7 cm.   Anemia    From GI bleed in 05/11/11.    Chronic anticoagulation, PE DVT PAF    Chronic pain    Depression    Dyslipidemia    Edema of left lower extremity    S/P I&D 08/30/06   GI bleed 04/2011   With anemia. EGD & colonoscopy 05/11/2011   History of alcohol dependence ([place])    HTN (hypertension)    Hyperlipemia    Hypertension    Iliac artery aneurysm, bilateral (HCC)    PVD, bilat common iliac aneurysms [443.9]   Iliopsoas muscle hematoma 04/2011   Lung nodule 2008   Morbid obesity ([place])    Morbid  obesity with BMI of 40.0-44.9, adult [facility])    Obstructive sleep apnea July 2013   Not wearing CPAP. Split-Night sleep study 06/03/11 AHI = 27.43/hr (TOTAL SLEEP TIME 105 minutes) and REM AHI = 120.00/hr   Osteoarthritis of both knees    Bilateral Arthroscopy in 2000 & 2008. Both were complicated w/venous thromboembolism S/P IVC filter placement due to one being massive PE in 2008.   PAF (paroxysmal atrial fibrillation)/atrial flutter 02/28/2010   ECHO : normal LV size, moderate concentricLVH, normal EF, normal atrial sizes, RV was normal but previously dilated;;  Myoview : Normal perfusion study. Low risk scan.   PAH (pulmonary artery hypertension) (HCC)    Prolonged QT interval    Psoriasis    Pulmonary embolism ([place]) 2008   DVT --> Massive PE with right-sided strain. On coumadin. ECHO 02/28/10 showed normal LV size, moderate concentricLVH, normal EF, normal atrial sizes, RV was normal but previously dilated.    Patient Active Problem List   Diagnosis Date Noted   Microhematuria 09/29/2019   Vitamin D deficiency 11/15/2018   Right foot pain 12/09/2017   Dysuria 12/07/2017   Corn of foot 02/05/2016   Hyperglycemia 02/05/2016   Acute upper respiratory infection 12/19/2014   Erectile dysfunction 11/03/2014   Rash 07/13/2014   Aftercare following surgery of the circulatory system 10/20/2013   Encounter for therapeutic drug monitoring 06/13/2013   Fitting and adjustment of vascular catheter  05/16/2013   Aneurysm of iliac artery (HCC) 10/14/2012   Iliac artery aneurysm ([place]) 10/09/2012   Right knee pain 12/03/2011   Fungal infection of skin of abdomen 10/30/2011   Encounter for long-term (current) use of anticoagulants 09/24/2011   Depression 09/08/2011   Preventative health care 09/07/2011   Prolonged QT interval 09/07/2011   PAH (pulmonary artery hypertension) ([place]) 09/07/2011   OSA on CPAP 05/11/2011   PVD, bilat iliac aneurysms 05/11/2011   Intermittent PAF and paroxysmal atrial  flutter 05/10/2011   Pulmonary embolism, "Massive" in 2008 05/10/2011   Chronic anticoagulation, PE DVT PAF 05/10/2011   Morbid obesity ([place]) 05/10/2011   Dyslipidemia 05/10/2011   Psoriasis 05/10/2011   Supratherapeutic INR, INR 10 on admission 05/07/2011   HTN (hypertension) 05/07/2011   Melena 05/07/2011   Anemia, Hgb 7.1 on admission 05/07/2011   Hypokalemia 05/07/2011   History of DVT (deep vein thrombosis) 05/07/2011   Right leg numbness, Rt iliopsois hematoma secondary to high INR on adm 05/07/2011    Past Surgical History:  Procedure Laterality Date   ABDOMINAL AORTAGRAM N/A 05/14/2011   Procedure: ABDOMINAL [name];  Surgeon: Conrad Nanwalek, MD;  Location: [facility] CATH LAB;  Service: Cardiovascular;  Laterality: N/A;   Abdominal Aortic Evar  08/11/11   Bilateral. Right is 3.2 & the left is 3.6. "Preclose" repair bilateral common femoral artery. Placement of catheter in aorta x 2. Repair of aorta w/modular bifurcated prosthesis w/bilateral limbs.  Placement of right limb extension: 23 mm x 12 cm. Placement of left limb extension: 14 mm x 7 cm.   ACHILLES TENDON SURGERY     COLONOSCOPY  05/11/2011   For GI bleed. Performed by Dr. Juanita Craver, MD. Also had a EGD performed at this time.   COLONOSCOPY  05/12/2011   EMBOLIZATION N/A 07/30/2011   Procedure: EMBOLIZATION;  Surgeon: Conrad Mayo, MD;  Location: [facility] CATH LAB;  Service: Cardiovascular;  Laterality: N/A;   ESOPHAGOGASTRODUODENOSCOPY  05/08/2011   For GI bleed/Anemia. Also had a colonoscopy at this time.   INSERTION OF VENA CAVA FILTER N/A 05/14/2011   Procedure: INSERTION OF VENA CAVA FILTER;  Surgeon: Conrad [place], MD;  Location: [facility] CATH LAB;  Service: Cardiovascular;  Laterality: N/A;   KNEE ARTHROSCOPY Bilateral 2000 & 2008   DJD knees. Both were complicated w/venous thromboembolism S/P IVC filter placement due to one being massive PE in 2008.   [name]   LOWER EXTREMITY ANGIOGRAM N/A 05/14/2011   Procedure: LOWER  EXTREMITY ANGIOGRAM;  Surgeon: Conrad [place], MD;  Location: [facility] CATH LAB;  Service: Cardiovascular;  Laterality: N/A;       Family History  Problem Relation Age of Onset   Cancer Mother        Pancreatic   Arthritis Mother    Arthritis Father    CAD Father 79   Cancer Brother        Colon   Fainting Neg Hx     Social History   Tobacco Use   Smoking status: Former    Packs/day: 0.50    Years: 2.00    Pack years: 1.00    Types: Cigarettes    Quit date: 07/05/2006    Years since quitting: 14.1   Smokeless tobacco: Never  Vaping Use   Vaping Use: Never used  Substance Use Topics   Alcohol use: Yes    Alcohol/week: 1.0 - 2.0 standard drink    Types: 1 - 2 Shots of liquor per week  Drug use: Yes    Home Medications Prior to Admission medications   Medication Sig Start Date End Date Taking? Authorizing Provider  allopurinol (ZYLOPRIM) 100 MG tablet TAKE 1 TABLET BY MOUTH  DAILY 06/19/20  Yes Biagio Borg, MD  atorvastatin (LIPITOR) 40 MG tablet TAKE 1 TABLET BY MOUTH  DAILY 06/19/20  Yes Biagio Borg, MD  dicyclomine (BENTYL) 20 MG tablet Take 1 tablet (20 mg total) by mouth 3 (three) times daily as needed for spasms (abdominal pain/spasm). 08/08/20  Yes Gareth Morgan, MD  digoxin (LANOXIN) 0.125 MG tablet TAKE 1 TABLET BY MOUTH  DAILY 06/19/20  Yes Biagio Borg, MD  diltiazem [facility]) 360 MG 24 hr capsule TAKE 1 CAPSULE BY MOUTH  DAILY 11/06/19  Yes Biagio Borg, MD  hydrochlorothiazide (MICROZIDE) 12.5 MG capsule TAKE 1 CAPSULE BY MOUTH  DAILY 06/19/20  Yes Biagio Borg, MD  metoprolol tartrate (LOPRESSOR) 50 MG tablet TAKE 1 TABLET BY MOUTH  TWICE DAILY 06/19/20  Yes Biagio Borg, MD  warfarin (COUMADIN) 5 MG tablet TAKE 1 TABLET BY MOUTH  DAILY EXCEPT 1 AND 1/2  TABLETS ON MONDAY,  WEDNESDAY , FRIDAY OR AS  DIRECTED 01/18/20  Yes Biagio Borg, MD  amoxicillin-clavulanate (AUGMENTIN) 875-125 MG tablet Take 1 tablet by mouth 2 (two) times daily. Patient not taking: Reported on  08/08/2020 12/12/19   [name], DO  clotrimazole (LOTRIMIN) 1 % cream Apply topically 2 (two) times daily. Patient not taking: Reported on 08/08/2020 10/30/11   Conrad [place], MD  desonide (DESOWEN) 0.05 % cream Apply 1 application topically daily. Apply to rash on face Patient not taking: Reported on 08/08/2020    [provider]  fluocinonide ointment (LIDEX) [id number] % Apply 1 application topically 2 (two) times daily. Apply to dry skin areas on body Patient not taking: Reported on 08/08/2020    [provider]  fluocinonide-emollient (LIDEX-E) 0.05 % cream Apply 1 application topically 2 (two) times daily. Patient not taking: Reported on 08/08/2020 11/07/13   Biagio Borg, MD  ketoconazole (NIZORAL) 200 MG tablet Take 1 tablet (200 mg total) by mouth daily. Patient not taking: Reported on 08/08/2020 07/13/14   Biagio Borg, MD  triamcinolone ointment (KENALOG) 0.5 % Apply 1 application topically 2 (two) times daily. Patient not taking: Reported on 08/08/2020 12/07/17   Biagio Borg, MD  VIAGRA 100 MG tablet TAKE 1/2 TO 1 (ONE-HALF TO ONE) TABLET BY MOUTH DAILY AS NEEDED FOR  ERECTILE  DYSFUNCTION Patient not taking: Reported on 08/08/2020 05/31/19   Biagio Borg, MD  Vitamin D, Ergocalciferol, (DRISDOL) 1.25 MG (50000 UT) CAPS capsule Take 1 capsule (50,000 Units total) by mouth every 7 (seven) days. Patient not taking: Reported on 08/08/2020 08/22/18   Biagio Borg, MD    Allergies    Crestor [rosuvastatin]  Review of Systems   Review of Systems  Constitutional:  Negative for fever.  HENT:  Negative for sore throat.   Eyes:  Negative for visual disturbance.  Respiratory:  Negative for shortness of breath.   Cardiovascular:  Negative for chest pain.  Gastrointestinal:  Positive for abdominal pain, nausea and vomiting. Negative for constipation and diarrhea.  Genitourinary:  Negative for difficulty urinating and dysuria.  Musculoskeletal:  Negative for back pain and neck  stiffness.  Skin:  Negative for rash.  Neurological:  Negative for syncope and headaches.   Physical Exam Updated Vital Signs BP (!) 159/127   Pulse [name])  57   Temp 97.6 F (36.4 C) (Oral)   Resp 20   Ht '6\' 1"'$  (1.854 m)   Wt 117.5 kg   SpO2 (!) 89%   BMI 34.17 kg/m   Physical Exam Vitals and nursing note reviewed.  Constitutional:      General: He is not in acute distress.    Appearance: He is well-developed. He is not diaphoretic.  HENT:     Head: Normocephalic and atraumatic.  Eyes:     Conjunctiva/sclera: Conjunctivae normal.  Cardiovascular:     Rate and Rhythm: Normal rate. Rhythm irregular.  Pulmonary:     Effort: Pulmonary effort is normal. No respiratory distress.  Abdominal:     General: There is no distension.     Palpations: Abdomen is soft.     Tenderness: There is abdominal tenderness (lower abdominal pain bilaterally). There is no guarding.  Musculoskeletal:     Cervical back: Normal range of motion.  Skin:    General: Skin is warm and dry.  Neurological:     Mental Status: He is alert and oriented to person, place, and time.    ED Results / Procedures / Treatments   Labs (all labs ordered are listed, but only abnormal results are displayed) Labs Reviewed  CBC WITH DIFFERENTIAL/PLATELET - Abnormal; Notable for the following components:      Result Value   RDW 15.7 (*)    All other components within normal limits  COMPREHENSIVE METABOLIC PANEL - Abnormal; Notable for the following components:   Glucose, Bld 117 (*)    Calcium 10.5 (*)    All other components within normal limits  URINALYSIS, ROUTINE W REFLEX MICROSCOPIC - Abnormal; Notable for the following components:   Protein, ur 30 (*)    All other components within normal limits  I-STAT CHEM 8, ED - Abnormal; Notable for the following components:   Glucose, Bld 117 (*)    Calcium, Ion 1.13 (*)    All other components within normal limits  URINE CULTURE  LIPASE, BLOOD  PROTIME-INR     EKG EKG Interpretation  Date/Time:  Thursday August 08 2020 13:22:55 EDT Ventricular Rate:  90 PR Interval:    QRS Duration: 100 QT Interval:  379 QTC Calculation: 464 R Axis:   75 Text Interpretation: Atrial fibrillation RSR' in V1 or V2, right VCD or RVH No significant change since last tracing Confirmed by Gareth Morgan [phone]) on 08/08/2020 2:40:28 PM  Radiology CT Angio Abd/Pel W and/or Wo Contrast  Result Date: 08/08/2020 CLINICAL DATA:  Abdominal pain, suspected aortic dissection EXAM: CTA ABDOMEN AND PELVIS WITHOUT AND WITH CONTRAST TECHNIQUE: Multidetector CT imaging of the abdomen and pelvis was performed using the standard protocol during bolus administration of intravenous contrast. Multiplanar reconstructed images and MIPs were obtained and reviewed to evaluate the vascular anatomy. CONTRAST:  68m OMNIPAQUE IOHEXOL 350 MG/ML SOLN COMPARISON:  CTA abdomen/pelvis 08/14/2016 FINDINGS: VASCULAR Aorta: Surgical changes of aorto bi-iliac bypass graft presumably for treatment of a right common iliac artery aneurysm. The bypass graft is widely patent and without evidence of complication. No evidence of aortic dissection. Celiac: Patent without evidence of aneurysm, dissection, vasculitis or significant stenosis. SMA: Patent without evidence of aneurysm, dissection, vasculitis or significant stenosis. Renals: Both renal arteries are patent without evidence of aneurysm, dissection, vasculitis, fibromuscular dysplasia or significant stenosis. IMA: Patent without evidence of aneurysm, dissection, vasculitis or significant stenosis. Inflow: Treated right iliac artery aneurysm without evidence of endoleak. Coil embolization of the left internal iliac  artery. Treated left iliac artery aneurysm without evidence of endoleak. Proximal Outflow: Bilateral common femoral and visualized portions of the superficial and profunda femoral arteries are patent without evidence of aneurysm, dissection,  vasculitis or significant stenosis. Veins: No evidence of deep venous thrombosis. An IVC filter is present in the infrarenal IVC. Multiple of the struts are penetrated but these are unchanged in comparison with prior imaging. Review of the MIP images confirms the above findings. NON-VASCULAR Lower chest: No acute abnormality. Cardiomegaly with right heart enlargement. Mild dependent atelectasis. Hepatobiliary: Stable hepatic cyst. No solid hepatic lesion. Normal contour and morphology. Gallbladder is unremarkable. No intra or extrahepatic biliary ductal dilatation. Pancreas: Unremarkable. No pancreatic ductal dilatation or surrounding inflammatory changes. Spleen: Normal in size without focal abnormality. Adrenals/Urinary Tract: No evidence of hydronephrosis, nephrolithiasis or enhancing renal mass. Low-attenuation lesion in the right renal pole previously identifies a cyst. The lesion is less well defined today but remains low in density. Unremarkable ureters and bladder. Stomach/Bowel: Colonic diverticular disease without CT evidence of active inflammation. Highly redundant transverse colon. No focal bowel wall thickening or evidence of obstruction. Lymphatic: No suspicious lymphadenopathy. Reproductive: Prostate is unremarkable. Other: No abdominal wall hernia or abnormality. No abdominopelvic ascites. Musculoskeletal: No acute or significant osseous findings. L5-S1 degenerative disc disease. IMPRESSION: VASCULAR 1. No evidence of aortic dissection or other acute vascular abnormality. 2. Surgical changes of prior repair of bilateral iliac artery aneurysms utilizing an aorto bi iliac bifurcated endoprosthesis. Both prior iliac artery aneurysms are successfully treated without evidence of endoleak. The aneurysm sacs have nearly completely regressed. 3. Tilted Cook Celect IVC filter in the infrarenal IVC with multiple penetrated struts. If patient no longer requires PE prophylaxis, consider referral to  Interventional Radiology or other endovascular specialist for possible filter retrieval. 4.  Aortic Atherosclerosis (ICD10-I70.0). NON-VASCULAR 1. No acute abnormality within the abdomen or pelvis. 2. Cardiomegaly with right heart enlargement. 3. Hepatic and right lower pole renal cysts. 4. Colonic diverticular disease without CT evidence of active inflammation. 5. L5-S1 degenerative disc disease. Electronically Signed   By: Jacqulynn Cadet M.D.   On: 08/08/2020 16:28    Procedures Procedures   Medications Ordered in ED Medications  fentaNYL (SUBLIMAZE) injection 100 mcg (100 mcg Intravenous Given 08/08/20 1503)  iohexol (OMNIPAQUE) 350 MG/ML injection 75 mL (75 mLs Intravenous Contrast Given 08/08/20 1604)  fentaNYL (SUBLIMAZE) injection 100 mcg (100 mcg Intravenous Given 08/08/20 1751)    ED Course  I have reviewed the triage vital signs and the nursing notes.  Pertinent labs & imaging results that were available during my care of the patient were reviewed by me and considered in my medical decision making (see chart for details).    MDM Rules/Calculators/A&P                            70 year old male with a history of AAA with endovascular stent graft in 2013, dyslipidemia, chronic anticoagulation, atrial fibrillation, pulmonary embolus on Coumadin, hypertension, history of alcohol dependence, GI bleed, who presents with concern for lower abdominal pain.  DDx includes appendicitis, pancreatitis, cholecystitis, pyelonephritis, nephrolithiasis, diverticulitis, endovascular leak AAA, mesenteric ischemia, volvulus, perforation.   Given concern for acute severe abdominal pain, afib hx and hx of AAA repair and iliac aneurysms, ordered istat labs and CT stat. Labs show mild hypercalcemia, no other significant abnormalities. No sign of UTI or pancreatitis.   CTA shows no acute abnormalities in the abdomen or pelvis, does show IVC  filter tilted--discussed with pt recommendation he discuss with  PCP whether this is needed and if not to remove with IR or other intravascular specialist. Do not suspect this was etiology of pain. Has redundent transverse colon, no sign of obstruction.  DDD, possible radicular pain but not having back pain. Possible other stomach cramping, consider viral infection. Given rx for bentyl. Patient discharged in stable condition with understanding of reasons to return.   Final Clinical Impression(s) / ED Diagnoses Final diagnoses:  Lower abdominal pain    Rx / DC Orders ED Discharge Orders          Ordered    dicyclomine (BENTYL) 20 MG tablet  3 times daily PRN        08/08/20 1645             Gareth Morgan, MD 08/08/20 2311

## 2020-08-08 NOTE — ED Notes (Signed)
Report given to [facility] 

## 2020-08-08 NOTE — ED Triage Notes (Signed)
Pt arrives from [facility] to ED BIB [facility] due to Afib. Per [facility] pt was having Abd Pain and was called out due to pt's HR being in the 120's. Hx of Afib, HTN, PE. Pt is a/o x4  BP 155/83

## 2020-08-08 NOTE — ED Notes (Signed)
Pt verbalizes understanding of discharge instructions. Opportunity for questions and answers were provided. Pt discharged from the ED.   ?

## 2020-08-09 LAB — URINE CULTURE: Culture: NO GROWTH

## 2020-08-15 ENCOUNTER — Other Ambulatory Visit: Payer: Self-pay

## 2020-08-15 ENCOUNTER — Ambulatory Visit (INDEPENDENT_AMBULATORY_CARE_PROVIDER_SITE_OTHER): Payer: Medicare Other | Admitting: Internal Medicine

## 2020-08-15 ENCOUNTER — Encounter: Payer: Self-pay | Admitting: Internal Medicine

## 2020-08-15 VITALS — BP 120/78 | HR 79 | Ht 73.0 in | Wt 254.0 lb

## 2020-08-15 DIAGNOSIS — I10 Essential (primary) hypertension: Secondary | ICD-10-CM

## 2020-08-15 DIAGNOSIS — F5101 Primary insomnia: Secondary | ICD-10-CM

## 2020-08-15 DIAGNOSIS — R739 Hyperglycemia, unspecified: Secondary | ICD-10-CM

## 2020-08-15 DIAGNOSIS — H43392 Other vitreous opacities, left eye: Secondary | ICD-10-CM | POA: Diagnosis not present

## 2020-08-15 MED ORDER — TRAZODONE HCL 50 MG PO TABS: 25.0000 mg | ORAL_TABLET | Freq: Every evening | ORAL | 3 refills | Status: AC | PRN

## 2020-08-15 NOTE — Progress Notes (Signed)
Patient ID: [name], male   DOB: Apr 29, 1950, 70 y.o.   MRN: [id number]        Chief Complaint: follow up left eye floaters, stress with insomnia, and htn       HPI:  [name] is a 70 y.o. male here overall doing ok, Pt denies chest pain, increased sob or doe, wheezing, orthopnea, PND, increased LE swelling, palpitations, dizziness or syncope.   Pt denies polydipsia, polyuria, or new focal neuro s/s.   Pt denies fever, wt loss, night sweats, loss of appetite, or other constitutional symptoms  Has had increased stress recently, now with 2 mo worsening nightly insomnia, just cant turn off to get to slep.  Denies worsening reflux, abd pain, dysphagia, n/v, bowel change or blood.  But now has new left eye floaters in the past 3 wks without pain, change in vision, flashing lights or HA.        Wt Readings from Last 3 Encounters:  08/15/20 254 lb (115.2 kg)  08/08/20 259 lb (117.5 kg)  09/29/19 283 lb (128.4 kg)   BP Readings from Last 3 Encounters:  08/15/20 120/78  08/08/20 (!) 159/127  08/08/20 115/72         Past Medical History:  Diagnosis Date   AAA (abdominal aortic aneurysm) [facility]) July 2013   Abdominal Aortic Endovascular Stent Graft 08/11/11. Bilateral. Right is 3.2 & the left is 3.6. "Preclose" repair bilateral common femoral artery. Placement of catheter in aorta x 2. Repair of aorta w/modular bifurcated prosthesis w/bilateral limbs.  Placement of right limb extension: 23 mm x 12 cm. Placement of left limb extension: 14 mm x 7 cm.   Anemia    From GI bleed in 05/11/11.    Chronic anticoagulation, PE DVT PAF    Chronic pain    Depression    Dyslipidemia    Edema of left lower extremity    S/P I&D 08/30/06   GI bleed 04/2011   With anemia. EGD & colonoscopy 05/11/2011   History of alcohol dependence ([place])    HTN (hypertension)    Hyperlipemia    Hypertension    Iliac artery aneurysm, bilateral (HCC)    PVD, bilat common iliac aneurysms [443.9]   Iliopsoas muscle hematoma  04/2011   Lung nodule 2008   Morbid obesity ([place])    Morbid obesity with BMI of 40.0-44.9, adult [facility])    Obstructive sleep apnea July 2013   Not wearing CPAP. Split-Night sleep study 06/03/11 AHI = 27.43/hr (TOTAL SLEEP TIME 105 minutes) and REM AHI = 120.00/hr   Osteoarthritis of both knees    Bilateral Arthroscopy in 2000 & 2008. Both were complicated w/venous thromboembolism S/P IVC filter placement due to one being massive PE in 2008.   PAF (paroxysmal atrial fibrillation)/atrial flutter 02/28/2010   ECHO : normal LV size, moderate concentricLVH, normal EF, normal atrial sizes, RV was normal but previously dilated;;  Myoview : Normal perfusion study. Low risk scan.   PAH (pulmonary artery hypertension) (HCC)    Prolonged QT interval    Psoriasis    Pulmonary embolism ([place]) 2008   DVT --> Massive PE with right-sided strain. On coumadin. ECHO 02/28/10 showed normal LV size, moderate concentricLVH, normal EF, normal atrial sizes, RV was normal but previously dilated.   Past Surgical History:  Procedure Laterality Date   ABDOMINAL AORTAGRAM N/A 05/14/2011   Procedure: ABDOMINAL AORTAGRAM;  Surgeon: Conrad Delbarton, MD;  Location: [facility] CATH LAB;  Service: Cardiovascular;  Laterality: N/A;  Abdominal Aortic Evar  08/11/11   Bilateral. Right is 3.2 & the left is 3.6. "Preclose" repair bilateral common femoral artery. Placement of catheter in aorta x 2. Repair of aorta w/modular bifurcated prosthesis w/bilateral limbs.  Placement of right limb extension: 23 mm x 12 cm. Placement of left limb extension: 14 mm x 7 cm.   ACHILLES TENDON SURGERY     COLONOSCOPY  05/11/2011   For GI bleed. Performed by Dr. Juanita Craver, MD. Also had a EGD performed at this time.   COLONOSCOPY  05/12/2011   EMBOLIZATION N/A 07/30/2011   Procedure: EMBOLIZATION;  Surgeon: Conrad [place], MD;  Location: [facility] CATH LAB;  Service: Cardiovascular;  Laterality: N/A;   ESOPHAGOGASTRODUODENOSCOPY  05/08/2011   For GI bleed/Anemia. Also had  a colonoscopy at this time.   INSERTION OF VENA CAVA FILTER N/A 05/14/2011   Procedure: INSERTION OF VENA CAVA FILTER;  Surgeon: Conrad Gonvick, MD;  Location: [facility] CATH LAB;  Service: Cardiovascular;  Laterality: N/A;   KNEE ARTHROSCOPY Bilateral 2000 & 2008   DJD knees. Both were complicated w/venous thromboembolism S/P IVC filter placement due to one being massive PE in 2008.   [place]   LOWER EXTREMITY ANGIOGRAM N/A 05/14/2011   Procedure: LOWER EXTREMITY ANGIOGRAM;  Surgeon: Conrad [place], MD;  Location: [facility] CATH LAB;  Service: Cardiovascular;  Laterality: N/A;    reports that he quit smoking about 14 years ago. He has a 1.00 pack-year smoking history. He has never used smokeless tobacco. He reports current alcohol use of about 1.0 - 2.0 standard drink of alcohol per week. He reports current drug use. family history includes Arthritis in his father and mother; CAD (age of onset: 62) in his father; Cancer in his brother and mother. Allergies  Allergen Reactions   Crestor [Rosuvastatin] Swelling   Current Outpatient Medications on File Prior to Visit  Medication Sig Dispense Refill   allopurinol (ZYLOPRIM) 100 MG tablet TAKE 1 TABLET BY MOUTH  DAILY 90 tablet 0   digoxin (LANOXIN) 0.125 MG tablet TAKE 1 TABLET BY MOUTH  DAILY 90 tablet 0   diltiazem (TIAZAC) 360 MG 24 hr capsule TAKE 1 CAPSULE BY MOUTH  DAILY 90 capsule 3   VIAGRA 100 MG tablet TAKE 1/2 TO 1 (ONE-HALF TO ONE) TABLET BY MOUTH DAILY AS NEEDED FOR  ERECTILE  DYSFUNCTION (Patient taking differently: TAKE 1/2 TO 1 (ONE-HALF TO ONE) TABLET BY MOUTH DAILY AS NEEDED FOR  ERECTILE  DYSFUNCTION) 10 tablet 0   warfarin (COUMADIN) 5 MG tablet TAKE 1 TABLET BY MOUTH  DAILY EXCEPT 1 AND 1/2  TABLETS ON MONDAY,  WEDNESDAY , FRIDAY OR AS  DIRECTED 120 tablet 3   atorvastatin (LIPITOR) 40 MG tablet TAKE 1 TABLET BY MOUTH  DAILY 90 tablet 0   augmented betamethasone dipropionate (DIPROLENE-AF) 0.05 % cream Apply 1 application topically 2  (two) times daily.     dicyclomine (BENTYL) 20 MG tablet Take 1 tablet (20 mg total) by mouth 3 (three) times daily as needed for spasms (abdominal pain/spasm). (Patient not taking: Reported on 08/15/2020) 20 tablet 0   No current facility-administered medications on file prior to visit.        ROS:  All others reviewed and negative.  Objective        PE:  BP 120/78 (BP Location: Left Arm, Patient Position: Sitting, Cuff Size: Large)   Pulse 79   Ht '6\' 1"'$  (1.854 m)   Wt 254 lb (115.2  kg)   SpO2 98%   BMI 33.51 kg/m                 Constitutional: Pt appears in NAD               HENT: Head: NCAT.                Right Ear: External ear normal.                 Left Ear: External ear normal.                Eyes: . Pupils are equal, round, and reactive to light. Conjunctivae and EOM are normal               Nose: without d/c or deformity               Neck: Neck supple. Gross normal ROM               Cardiovascular: Normal rate and regular rhythm.                 Pulmonary/Chest: Effort normal and breath sounds without rales or wheezing.                Abd:  Soft, NT, ND, + BS, no organomegaly               Neurological: Pt is alert. At baseline orientation, motor grossly intact               Skin: Skin is warm. No rashes, no other new lesions, LE edema - none               Psychiatric: Pt behavior is normal without agitation   Micro: none  Cardiac tracings I have personally interpreted today:  none  Pertinent Radiological findings (summarize): none   Lab Results  Component Value Date   WBC 6.0 08/08/2020   HGB 16.3 08/08/2020   HCT 48.0 08/08/2020   PLT 191 08/08/2020   GLUCOSE 117 (H) 08/08/2020   CHOL 136 03/29/2019   TRIG 119.0 03/29/2019   HDL 43.20 03/29/2019   LDLCALC 69 03/29/2019   ALT 24 08/08/2020   AST 35 08/08/2020   NA 141 08/08/2020   K 3.7 08/08/2020   CL 104 08/08/2020   CREATININE 0.70 08/08/2020   BUN 9 08/08/2020   CO2 24 08/08/2020   TSH 1.49  03/29/2019   PSA 0.85 03/29/2019   INR 2.2 07/11/2020   HGBA1C 5.9 03/29/2019   Assessment/Plan:  [name] is a 70 y.o. Black or African American [2] male with  has a past medical history of AAA (abdominal aortic aneurysm) ([place]) (July 2013), Anemia, Chronic anticoagulation, PE DVT PAF, Chronic pain, Depression, Dyslipidemia, Edema of left lower extremity, GI bleed (04/2011), History of alcohol dependence ([place]), HTN (hypertension), Hyperlipemia, Hypertension, Iliac artery aneurysm, bilateral ([place]), Iliopsoas muscle hematoma (04/2011), Lung nodule (2008), Morbid obesity ([place]), Morbid obesity with BMI of 40.0-44.9, adult [facility]), Obstructive sleep apnea (July 2013), Osteoarthritis of both knees, PAF (paroxysmal atrial fibrillation)/atrial flutter (02/28/2010), PAH (pulmonary artery hypertension) ([place]), Prolonged QT interval, Psoriasis, and Pulmonary embolism ([place]) (2008).  Floaters, left ? Etiollgy, suspect benign but r/o retinal involvement, now for optho referral,  to f/u any worsening symptoms or concerns   HTN (hypertension) Improved with wt loss, now off metoprolol. Cont samt tx,  to f/u any worsening symptoms or concerns BP Readings from Last 3 Encounters:  08/15/20  120/78  08/08/20 (!) 159/127  08/08/20 115/72    Hyperglycemia Lab Results  Component Value Date   HGBA1C 5.9 03/29/2019   Stable, pt to continue current medical treatment  - diet   Insomnia Mild to mod, for trazodoen qhs prn,  to f/u any worsening symptoms or concerns  Followup: Return in about 4 weeks (around 09/12/2020).  Cathlean Cower, MD 08/17/2020 11:08 PM [facility]

## 2020-08-15 NOTE — Patient Instructions (Signed)
Please take all new medication as prescribed- the trazodone for sleep  Please continue all other medications as before, and refills have been done if requested.  Please have the pharmacy call with any other refills you may need.  Please continue your efforts at being more active, low cholesterol diet, and weight control  Please keep your appointments with your specialists as you may have planned  You will be contacted regarding the referral for: eye doctor  Please make an Appointment to return in 1 month

## 2020-08-17 ENCOUNTER — Encounter: Payer: Self-pay | Admitting: Internal Medicine

## 2020-08-17 DIAGNOSIS — H43392 Other vitreous opacities, left eye: Secondary | ICD-10-CM | POA: Insufficient documentation

## 2020-08-17 DIAGNOSIS — G4700 Insomnia, unspecified: Secondary | ICD-10-CM | POA: Insufficient documentation

## 2020-08-17 NOTE — Assessment & Plan Note (Signed)
Improved with wt loss, now off metoprolol. Cont samt tx,  to f/u any worsening symptoms or concerns BP Readings from Last 3 Encounters:  08/15/20 120/78  08/08/20 (!) 159/127  08/08/20 115/72

## 2020-08-17 NOTE — Assessment & Plan Note (Signed)
Mild to mod, for trazodoen qhs prn,  to f/u any worsening symptoms or concerns

## 2020-08-17 NOTE — Assessment & Plan Note (Signed)
Lab Results  Component Value Date   HGBA1C 5.9 03/29/2019   Stable, pt to continue current medical treatment  - diet

## 2020-08-17 NOTE — Assessment & Plan Note (Signed)
?   Etiollgy, suspect benign but r/o retinal involvement, now for optho referral,  to f/u any worsening symptoms or concerns

## 2020-08-22 ENCOUNTER — Ambulatory Visit: Payer: Medicare Other

## 2020-08-23 ENCOUNTER — Ambulatory Visit: Payer: Medicare Other

## 2020-08-28 DIAGNOSIS — H2513 Age-related nuclear cataract, bilateral: Secondary | ICD-10-CM | POA: Diagnosis not present

## 2020-08-28 DIAGNOSIS — H43393 Other vitreous opacities, bilateral: Secondary | ICD-10-CM | POA: Diagnosis not present

## 2020-08-28 DIAGNOSIS — H35411 Lattice degeneration of retina, right eye: Secondary | ICD-10-CM | POA: Diagnosis not present

## 2020-08-28 DIAGNOSIS — H5213 Myopia, bilateral: Secondary | ICD-10-CM | POA: Diagnosis not present

## 2020-08-28 DIAGNOSIS — H524 Presbyopia: Secondary | ICD-10-CM | POA: Diagnosis not present

## 2020-09-01 ENCOUNTER — Other Ambulatory Visit: Payer: Self-pay | Admitting: Internal Medicine

## 2020-09-05 ENCOUNTER — Other Ambulatory Visit: Payer: Self-pay

## 2020-09-05 ENCOUNTER — Ambulatory Visit (INDEPENDENT_AMBULATORY_CARE_PROVIDER_SITE_OTHER): Payer: Medicare Other

## 2020-09-05 DIAGNOSIS — Z7901 Long term (current) use of anticoagulants: Secondary | ICD-10-CM

## 2020-09-05 LAB — POCT INR: INR: 1.9 — AB (ref 2.0–3.0)

## 2020-09-05 NOTE — Patient Instructions (Addendum)
Pre visit review using our clinic review tool, if applicable. No additional management support is needed unless otherwise documented below in the visit note.  Take 1 1/2 tablet today and the continue to take 1 tablet daily except 1 1/2 tablets on Monday.  Re-check in 6 weeks.

## 2020-09-05 NOTE — Progress Notes (Signed)
Medical screening examination/treatment/procedure(s) were performed by non-physician practitioner and as supervising physician I was immediately available for consultation/collaboration. I agree with above. Megean Fabio, MD   

## 2020-09-19 ENCOUNTER — Encounter: Payer: Self-pay | Admitting: Internal Medicine

## 2020-09-19 ENCOUNTER — Other Ambulatory Visit: Payer: Self-pay

## 2020-09-19 ENCOUNTER — Ambulatory Visit (INDEPENDENT_AMBULATORY_CARE_PROVIDER_SITE_OTHER): Payer: Medicare Other | Admitting: Internal Medicine

## 2020-09-19 VITALS — BP 126/70 | HR 82 | Temp 98.9°F | Ht 73.0 in | Wt 254.0 lb

## 2020-09-19 DIAGNOSIS — Z23 Encounter for immunization: Secondary | ICD-10-CM | POA: Diagnosis not present

## 2020-09-19 DIAGNOSIS — E538 Deficiency of other specified B group vitamins: Secondary | ICD-10-CM

## 2020-09-19 DIAGNOSIS — Z0001 Encounter for general adult medical examination with abnormal findings: Secondary | ICD-10-CM | POA: Diagnosis not present

## 2020-09-19 DIAGNOSIS — E785 Hyperlipidemia, unspecified: Secondary | ICD-10-CM | POA: Diagnosis not present

## 2020-09-19 DIAGNOSIS — E559 Vitamin D deficiency, unspecified: Secondary | ICD-10-CM

## 2020-09-19 DIAGNOSIS — I10 Essential (primary) hypertension: Secondary | ICD-10-CM

## 2020-09-19 DIAGNOSIS — R739 Hyperglycemia, unspecified: Secondary | ICD-10-CM

## 2020-09-19 LAB — CBC WITH DIFFERENTIAL/PLATELET
Basophils Absolute: 0 10*3/uL (ref 0.0–0.1)
Basophils Relative: 0.7 % (ref 0.0–3.0)
Eosinophils Absolute: 0.1 10*3/uL (ref 0.0–0.7)
Eosinophils Relative: 2.1 % (ref 0.0–5.0)
HCT: 42.7 % (ref 39.0–52.0)
Hemoglobin: 14.1 g/dL (ref 13.0–17.0)
Lymphocytes Relative: 22.3 % (ref 12.0–46.0)
Lymphs Abs: 1.3 10*3/uL (ref 0.7–4.0)
MCHC: 32.9 g/dL (ref 30.0–36.0)
MCV: 97.6 fl (ref 78.0–100.0)
Monocytes Absolute: 0.7 10*3/uL (ref 0.1–1.0)
Monocytes Relative: 11.7 % (ref 3.0–12.0)
Neutro Abs: 3.7 10*3/uL (ref 1.4–7.7)
Neutrophils Relative %: 63.2 % (ref 43.0–77.0)
Platelets: 199 10*3/uL (ref 150.0–400.0)
RBC: 4.37 Mil/uL (ref 4.22–5.81)
RDW: 15.7 % — ABNORMAL HIGH (ref 11.5–15.5)
WBC: 5.9 10*3/uL (ref 4.0–10.5)

## 2020-09-19 LAB — HEPATIC FUNCTION PANEL
ALT: 16 U/L (ref 0–53)
AST: 18 U/L (ref 0–37)
Albumin: 3.8 g/dL (ref 3.5–5.2)
Alkaline Phosphatase: 113 U/L (ref 39–117)
Bilirubin, Direct: 0.2 mg/dL (ref 0.0–0.3)
Total Bilirubin: 0.8 mg/dL (ref 0.2–1.2)
Total Protein: 7.5 g/dL (ref 6.0–8.3)

## 2020-09-19 LAB — LIPID PANEL
Cholesterol: 132 mg/dL (ref 0–200)
HDL: 43.9 mg/dL (ref >39.00)
LDL Cholesterol: 68 mg/dL (ref 0–99)
NonHDL: 87.9
Total CHOL/HDL Ratio: 3
Triglycerides: 98 mg/dL (ref 0.0–149.0)
VLDL: 19.6 mg/dL (ref 0.0–40.0)

## 2020-09-19 LAB — BASIC METABOLIC PANEL
BUN: 26 mg/dL — ABNORMAL HIGH (ref 6–23)
CO2: 29 mEq/L (ref 19–32)
Calcium: 9.4 mg/dL (ref 8.4–10.5)
Chloride: 104 mEq/L (ref 96–112)
Creatinine, Ser: 1.1 mg/dL (ref 0.40–1.50)
GFR: 67.99 mL/min (ref >60.00)
Glucose, Bld: 94 mg/dL (ref 70–99)
Potassium: 4 mEq/L (ref 3.5–5.1)
Sodium: 141 mEq/L (ref 135–145)

## 2020-09-19 LAB — VITAMIN D 25 HYDROXY (VIT D DEFICIENCY, FRACTURES): VITD: 35.87 ng/mL (ref 30.00–100.00)

## 2020-09-19 LAB — VITAMIN B12: Vitamin B-12: 424 pg/mL (ref 211–911)

## 2020-09-19 LAB — PSA: PSA: 0.8 ng/mL (ref 0.10–4.00)

## 2020-09-19 LAB — TSH: TSH: 1.93 u[IU]/mL (ref 0.35–5.50)

## 2020-09-19 LAB — HEMOGLOBIN A1C: Hgb A1c MFr Bld: 5.8 % (ref 4.6–6.5)

## 2020-09-19 NOTE — Progress Notes (Signed)
Patient ID: [name], male   DOB: 01/13/1950, 70 y.o.   MRN: [id number]         Chief Complaint:: wellness exam and low vit d, aortic atherosclerosis, hyperglycemia, hld, htn       HPI:  [name] is a 71 y.o. male here for wellness exam; decliens covid booster, shingrix o/w up to date with preventive referrals and immunizations                        Also not taking vit d.  Pt denies chest pain, increased sob or doe, wheezing, orthopnea, PND, increased LE swelling, palpitations, dizziness or syncope.   Pt denies polydipsia, polyuria, or new focal neuro s/s.   Pt denies fever,  night sweats, loss of appetite, or other constitutional symptoms  Conts to lose wt intentionally, working on better exercise.  Trying to follow a lower chol diet.  No other new complaints Wt Readings from Last 3 Encounters:  09/19/20 254 lb (115.2 kg)  08/15/20 254 lb (115.2 kg)  08/08/20 259 lb (117.5 kg)   BP Readings from Last 3 Encounters:  09/19/20 126/70  08/15/20 120/78  08/08/20 (!) 159/127   Immunization History  Administered Date(s) Administered   Fluad Quad(high Dose 65+) 09/02/2018, 09/29/2019, 09/19/2020   Influenza Split 11/03/2011   Influenza, High Dose Seasonal PF 11/01/2014, 10/02/2015, 09/09/2016, 10/01/2017   Influenza,inj,Quad PF,6+ Mos 10/18/2012, 09/26/2013   PFIZER(Purple Top)SARS-COV-2 Vaccination 02/03/2019, 02/24/2019, 04/09/2020   Pneumococcal Conjugate-13 02/05/2016   Pneumococcal Polysaccharide-23 07/09/2017   Tdap 09/08/2011   There are no preventive care reminders to display for this patient.     Past Medical History:  Diagnosis Date   AAA (abdominal aortic aneurysm) [facility]) July 2013   Abdominal Aortic Endovascular Stent Graft 08/11/11. Bilateral. Right is 3.2 & the left is 3.6. "Preclose" repair bilateral common femoral artery. Placement of catheter in aorta x 2. Repair of aorta w/modular bifurcated prosthesis w/bilateral limbs.  Placement of right limb extension: 23 mm  x 12 cm. Placement of left limb extension: 14 mm x 7 cm.   Anemia    From GI bleed in 05/11/11.    Chronic anticoagulation, PE DVT PAF    Chronic pain    Depression    Dyslipidemia    Edema of left lower extremity    S/P I&D 08/30/06   GI bleed 04/2011   With anemia. EGD & colonoscopy 05/11/2011   History of alcohol dependence ([place])    HTN (hypertension)    Hyperlipemia    Hypertension    Iliac artery aneurysm, bilateral (HCC)    PVD, bilat common iliac aneurysms [443.9]   Iliopsoas muscle hematoma 04/2011   Lung nodule 2008   Morbid obesity ([place])    Morbid obesity with BMI of 40.0-44.9, adult [facility])    Obstructive sleep apnea July 2013   Not wearing CPAP. Split-Night sleep study 06/03/11 AHI = 27.43/hr (TOTAL SLEEP TIME 105 minutes) and REM AHI = 120.00/hr   Osteoarthritis of both knees    Bilateral Arthroscopy in 2000 & 2008. Both were complicated w/venous thromboembolism S/P IVC filter placement due to one being massive PE in 2008.   PAF (paroxysmal atrial fibrillation)/atrial flutter 02/28/2010   ECHO : normal LV size, moderate concentricLVH, normal EF, normal atrial sizes, RV was normal but previously dilated;;  Myoview : Normal perfusion study. Low risk scan.   PAH (pulmonary artery hypertension) (HCC)    Prolonged QT interval  Psoriasis    Pulmonary embolism ([place]) 2008   DVT --> Massive PE with right-sided strain. On coumadin. ECHO 02/28/10 showed normal LV size, moderate concentricLVH, normal EF, normal atrial sizes, RV was normal but previously dilated.   Past Surgical History:  Procedure Laterality Date   ABDOMINAL AORTAGRAM N/A 05/14/2011   Procedure: ABDOMINAL AORTAGRAM;  Surgeon: Conrad [place], MD;  Location: [facility] CATH LAB;  Service: Cardiovascular;  Laterality: N/A;   Abdominal Aortic Evar  08/11/11   Bilateral. Right is 3.2 & the left is 3.6. "Preclose" repair bilateral common femoral artery. Placement of catheter in aorta x 2. Repair of aorta w/modular bifurcated prosthesis  w/bilateral limbs.  Placement of right limb extension: 23 mm x 12 cm. Placement of left limb extension: 14 mm x 7 cm.   ACHILLES TENDON SURGERY     COLONOSCOPY  05/11/2011   For GI bleed. Performed by Dr. Juanita Craver, MD. Also had a EGD performed at this time.   COLONOSCOPY  05/12/2011   EMBOLIZATION N/A 07/30/2011   Procedure: EMBOLIZATION;  Surgeon: Conrad [place]way, MD;  Location: [facility] CATH LAB;  Service: Cardiovascular;  Laterality: N/A;   ESOPHAGOGASTRODUODENOSCOPY  05/08/2011   For GI bleed/Anemia. Also had a colonoscopy at this time.   INSERTION OF VENA CAVA FILTER N/A 05/14/2011   Procedure: INSERTION OF VENA CAVA FILTER;  Surgeon: Conrad [place], MD;  Location: [facility] CATH LAB;  Service: Cardiovascular;  Laterality: N/A;   KNEE ARTHROSCOPY Bilateral 2000 & 2008   DJD knees. Both were complicated w/venous thromboembolism S/P IVC filter placement due to one being massive PE in 2008.   [place]   LOWER EXTREMITY ANGIOGRAM N/A 05/14/2011   Procedure: LOWER EXTREMITY ANGIOGRAM;  Surgeon: Conrad Osseo, MD;  Location: [facility] CATH LAB;  Service: Cardiovascular;  Laterality: N/A;    reports that he quit smoking about 14 years ago. His smoking use included cigarettes. He has a 1.00 pack-year smoking history. He has never used smokeless tobacco. He reports current alcohol use of about 1.0 - 2.0 standard drink per week. He reports current drug use. family history includes Arthritis in his father and mother; CAD (age of onset: 67) in his father; Cancer in his brother and mother. Allergies  Allergen Reactions   Crestor [Rosuvastatin] Swelling   Current Outpatient Medications on File Prior to Visit  Medication Sig Dispense Refill   allopurinol (ZYLOPRIM) 100 MG tablet TAKE 1 TABLET BY MOUTH  DAILY 90 tablet 3   atorvastatin (LIPITOR) 40 MG tablet TAKE 1 TABLET BY MOUTH  DAILY 90 tablet 3   augmented betamethasone dipropionate (DIPROLENE-AF) 0.05 % cream Apply 1 application topically 2 (two) times daily.      dicyclomine (BENTYL) 20 MG tablet Take 1 tablet (20 mg total) by mouth 3 (three) times daily as needed for spasms (abdominal pain/spasm). 20 tablet 0   digoxin (LANOXIN) 0.125 MG tablet TAKE 1 TABLET BY MOUTH  DAILY 90 tablet 3   diltiazem (TIAZAC) 360 MG 24 hr capsule TAKE 1 CAPSULE BY MOUTH  DAILY 90 capsule 3   traZODone (DESYREL) 50 MG tablet Take 0.5-1 tablets (25-50 mg total) by mouth at bedtime as needed for sleep. 30 tablet 3   VIAGRA 100 MG tablet TAKE 1/2 TO 1 (ONE-HALF TO ONE) TABLET BY MOUTH DAILY AS NEEDED FOR  ERECTILE  DYSFUNCTION (Patient taking differently: TAKE 1/2 TO 1 (ONE-HALF TO ONE) TABLET BY MOUTH DAILY AS NEEDED FOR  ERECTILE  DYSFUNCTION) 10 tablet 0  warfarin (COUMADIN) 5 MG tablet TAKE 1 TABLET BY MOUTH  DAILY EXCEPT 1 AND 1/2  TABLETS ON MONDAY,  WEDNESDAY , FRIDAY OR AS  DIRECTED 120 tablet 3   No current facility-administered medications on file prior to visit.        ROS:  All others reviewed and negative.  Objective        PE:  BP 126/70 (BP Location: Left Arm, Patient Position: Sitting, Cuff Size: Large)   Pulse 82   Temp 98.9 F (37.2 C) (Oral)   Ht '6\' 1"'$  (1.854 m)   Wt 254 lb (115.2 kg)   SpO2 99%   BMI 33.51 kg/m                 Constitutional: Pt appears in NAD               HENT: Head: NCAT.                Right Ear: External ear normal.                 Left Ear: External ear normal.                Eyes: . Pupils are equal, round, and reactive to light. Conjunctivae and EOM are normal               Nose: without d/c or deformity               Neck: Neck supple. Gross normal ROM               Cardiovascular: Normal rate and regular rhythm.                 Pulmonary/Chest: Effort normal and breath sounds without rales or wheezing.                Abd:  Soft, NT, ND, + BS, no organomegaly               Neurological: Pt is alert. At baseline orientation, motor grossly intact               Skin: Skin is warm. No rashes, no other new lesions, LE  edema - none               Psychiatric: Pt behavior is normal without agitation   Micro: none  Cardiac tracings I have personally interpreted today:  none  Pertinent Radiological findings (summarize): none   Lab Results  Component Value Date   WBC 5.9 09/19/2020   HGB 14.1 09/19/2020   HCT 42.7 09/19/2020   PLT 199.0 09/19/2020   GLUCOSE 94 09/19/2020   CHOL 132 09/19/2020   TRIG 98.0 09/19/2020   HDL 43.90 09/19/2020   LDLCALC 68 09/19/2020   ALT 16 09/19/2020   AST 18 09/19/2020   NA 141 09/19/2020   K 4.0 09/19/2020   CL 104 09/19/2020   CREATININE 1.10 09/19/2020   BUN 26 (H) 09/19/2020   CO2 29 09/19/2020   TSH 1.93 09/19/2020   PSA 0.80 09/19/2020   INR 1.9 (A) 09/05/2020   HGBA1C 5.8 09/19/2020   Assessment/Plan:  [name] is a 70 y.o. Black or African American [2] male with  has a past medical history of AAA (abdominal aortic aneurysm) [facility]) (July 2013), Anemia, Chronic anticoagulation, PE DVT PAF, Chronic pain, Depression, Dyslipidemia, Edema of left lower extremity, GI bleed (04/2011), History of alcohol dependence ([place]), HTN (hypertension), Hyperlipemia, Hypertension,  Iliac artery aneurysm, bilateral (HCC), Iliopsoas muscle hematoma (04/2011), Lung nodule (2008), Morbid obesity ([place]), Morbid obesity with BMI of 40.0-44.9, adult [facility]), Obstructive sleep apnea (July 2013), Osteoarthritis of both knees, PAF (paroxysmal atrial fibrillation)/atrial flutter (02/28/2010), PAH (pulmonary artery hypertension) ([place]), Prolonged QT interval, Psoriasis, and Pulmonary embolism ([place]) (2008).  Vitamin D deficiency Last vitamin D Lab Results  Component Value Date   VD25OH 35.87 09/19/2020   Low normal, to start oral replacement 2000 u qd   Encounter for well adult exam with abnormal findings Age and sex appropriate education and counseling updated with regular exercise and diet Referrals for preventative services - none needed Immunizations addressed - declines covid  booster, shignrix  Smoking counseling  - none needed Evidence for depression or other mood disorder - none significant Most recent labs reviewed. I have personally reviewed and have noted: 1) the patient's medical and social history 2) The patient's current medications and supplements 3) The patient's height, weight, and BMI have been recorded in the chart   Hyperglycemia Lab Results  Component Value Date   HGBA1C 5.8 09/19/2020   Stable, pt to continue current medical treatment  - diet   HTN (hypertension) BP Readings from Last 3 Encounters:  09/19/20 126/70  08/15/20 120/78  08/08/20 (!) 159/127   Stable, pt to continue medical treatment diltiazem   Dyslipidemia Lab Results  Component Value Date   LDLCALC 68 09/19/2020   Stable, pt to continue current statin lipitor 40  Followup: Return in about 1 year (around 09/19/2021).  Cathlean Cower, MD 09/22/2020 9:51 PM [facility]

## 2020-09-19 NOTE — Patient Instructions (Addendum)
You had the flu shot today  Please take OTC Vitamin D3 at 2000 units per day, indefinitely, or 4000 units if you already take the 2000 units.    Please continue all other medications as before, and refills have been done if requested.  Please have the pharmacy call with any other refills you may need.  Please continue your efforts at being more active, low cholesterol diet, and weight control.  You are otherwise up to date with prevention measures today.  Please keep your appointments with your specialists as you may have planned  Please go to the LAB at the blood drawing area for the tests to be done  You will be contacted by phone if any changes need to be made immediately.  Otherwise, you will receive a letter about your results with an explanation, but please check with MyChart first.  Please remember to sign up for MyChart if you have not done so, as this will be important to you in the future with finding out test results, communicating by private email, and scheduling acute appointments online when needed.  Please make an Appointment to return for your 1 year visit, or sooner if needed, with Lab testing by Appointment as well, to be done about 3-5 days before at the [facility] (so this is for TWO appointments - please see the scheduling desk as you leave)  Due to the ongoing Covid 19 pandemic, our lab now requires an appointment for any labs done at our office.  If you need labs done and do not have an appointment, please call our office ahead of time to schedule before presenting to the lab for your testing.

## 2020-09-22 ENCOUNTER — Encounter: Payer: Self-pay | Admitting: Internal Medicine

## 2020-09-22 NOTE — Assessment & Plan Note (Signed)
BP Readings from Last 3 Encounters:  09/19/20 126/70  08/15/20 120/78  08/08/20 (!) 159/127   Stable, pt to continue medical treatment diltiazem

## 2020-09-22 NOTE — Assessment & Plan Note (Signed)
Lab Results  Component Value Date   LDLCALC 68 09/19/2020   Stable, pt to continue current statin lipitor 40

## 2020-09-22 NOTE — Assessment & Plan Note (Signed)
Lab Results  Component Value Date   HGBA1C 5.8 09/19/2020   Stable, pt to continue current medical treatment  - diet

## 2020-09-22 NOTE — Assessment & Plan Note (Signed)
Last vitamin D Lab Results  Component Value Date   VD25OH 35.87 09/19/2020   Low normal, to start oral replacement 2000 u qd

## 2020-09-22 NOTE — Assessment & Plan Note (Signed)
Age and sex appropriate education and counseling updated with regular exercise and diet Referrals for preventative services - none needed Immunizations addressed - declines covid booster, shignrix  Smoking counseling  - none needed Evidence for depression or other mood disorder - none significant Most recent labs reviewed. I have personally reviewed and have noted: 1) the patient's medical and social history 2) The patient's current medications and supplements 3) The patient's height, weight, and BMI have been recorded in the chart

## 2020-09-23 LAB — URINALYSIS, ROUTINE W REFLEX MICROSCOPIC
Bilirubin Urine: NEGATIVE
Hgb urine dipstick: NEGATIVE
Ketones, ur: NEGATIVE
Leukocytes,Ua: NEGATIVE
Nitrite: NEGATIVE
RBC / HPF: NONE SEEN (ref 0–?)
Specific Gravity, Urine: <=1.005 — AB (ref 1.000–1.030)
Total Protein, Urine: NEGATIVE
Urine Glucose: NEGATIVE
Urobilinogen, UA: 1 (ref 0.0–1.0)
WBC, UA: NONE SEEN (ref 0–?)
pH: 7 (ref 5.0–8.0)

## 2020-10-17 ENCOUNTER — Other Ambulatory Visit: Payer: Self-pay

## 2020-10-17 ENCOUNTER — Ambulatory Visit (INDEPENDENT_AMBULATORY_CARE_PROVIDER_SITE_OTHER): Payer: Medicare Other

## 2020-10-17 DIAGNOSIS — Z7901 Long term (current) use of anticoagulants: Secondary | ICD-10-CM | POA: Diagnosis not present

## 2020-10-17 LAB — POCT INR: INR: 2.2 (ref 2.0–3.0)

## 2020-10-17 NOTE — Patient Instructions (Addendum)
Pre visit review using our clinic review tool, if applicable. No additional management support is needed unless otherwise documented below in the visit note.  Continue to take 1 tablet daily except 1 1/2 tablets on Monday.  Re-check in 6 weeks.  

## 2020-10-17 NOTE — Progress Notes (Signed)
Medical screening examination/treatment/procedure(s) were performed by non-physician practitioner and as supervising physician I was immediately available for consultation/collaboration. I agree with above. Ceceilia Cephus, MD   

## 2020-10-29 ENCOUNTER — Other Ambulatory Visit: Payer: Self-pay | Admitting: Internal Medicine

## 2020-11-21 ENCOUNTER — Other Ambulatory Visit: Payer: Self-pay | Admitting: Internal Medicine

## 2020-11-21 DIAGNOSIS — Z7901 Long term (current) use of anticoagulants: Secondary | ICD-10-CM

## 2020-11-21 NOTE — Telephone Encounter (Signed)
Please to ask pt to reqeust refill per his coumadin clinic, thanks

## 2020-11-21 NOTE — Telephone Encounter (Signed)
Pt is compliant with coumadin management. Sent in refill.

## 2020-11-28 ENCOUNTER — Ambulatory Visit: Payer: Medicare Other

## 2020-12-12 ENCOUNTER — Other Ambulatory Visit: Payer: Self-pay

## 2020-12-12 ENCOUNTER — Ambulatory Visit (INDEPENDENT_AMBULATORY_CARE_PROVIDER_SITE_OTHER): Payer: Medicare Other

## 2020-12-12 DIAGNOSIS — Z7901 Long term (current) use of anticoagulants: Secondary | ICD-10-CM

## 2020-12-12 LAB — POCT INR: INR: 1.9 — AB (ref 2.0–3.0)

## 2020-12-12 NOTE — Progress Notes (Signed)
Patient ID: [name], male   DOB: 19-Mar-1950, 70 y.o.   MRN: [id number]  Medical screening examination/treatment/procedure(s) were performed by non-physician practitioner and as supervising physician I was immediately available for consultation/collaboration.  I agree with above. Cathlean Cower, MD

## 2020-12-12 NOTE — Progress Notes (Signed)
Increase dose today to take 1 1/2 tablets and then continue to take 1 tablet daily except 1 1/2 tablets on Monday.  Re-check in 6 weeks

## 2020-12-12 NOTE — Patient Instructions (Addendum)
Pre visit review using our clinic review tool, if applicable. No additional management support is needed unless otherwise documented below in the visit note.  Increase dose today to take 1 1/2 tablets and then continue to take 1 tablet daily except 1 1/2 tablets on Monday.  Re-check in 6 weeks

## 2021-01-08 ENCOUNTER — Other Ambulatory Visit: Payer: Self-pay | Admitting: Internal Medicine

## 2021-01-08 ENCOUNTER — Telehealth: Payer: Self-pay | Admitting: Internal Medicine

## 2021-01-08 NOTE — Telephone Encounter (Signed)
Sorry, I dont see either one on his med list, so I would not take these for now

## 2021-01-08 NOTE — Telephone Encounter (Signed)
1.Medication Requested: metoprolol tartrate (LOPRESSOR) 50 MG tablet  hydrochlorothiazide (MICROZIDE) 12.5 MG capsule   2. Pharmacy (Name, Street, [place]): [facility] ([place], [place]  Phone:  [phone] Fax:  [phone]   3. On Med List: no  4. Last Visit with PCP: 09.08.22  5. Next visit date with PCP: n/a  **Discontinued from medication list 08.24.22.. but patient says he is still taking the medication as of today**   Agent: Please be advised that RX refills may take up to 3 business days. We ask that you follow-up with your pharmacy.

## 2021-01-08 NOTE — Telephone Encounter (Signed)
Patient notified

## 2021-01-08 NOTE — Telephone Encounter (Signed)
Is patient supposed to be taking HCTZ and Metoprolol?

## 2021-01-23 ENCOUNTER — Ambulatory Visit (INDEPENDENT_AMBULATORY_CARE_PROVIDER_SITE_OTHER): Payer: Medicare Other

## 2021-01-23 ENCOUNTER — Other Ambulatory Visit: Payer: Self-pay

## 2021-01-23 ENCOUNTER — Telehealth: Payer: Self-pay | Admitting: Internal Medicine

## 2021-01-23 DIAGNOSIS — Z7901 Long term (current) use of anticoagulants: Secondary | ICD-10-CM | POA: Diagnosis not present

## 2021-01-23 LAB — POCT INR: INR: 1.9 — AB (ref 2.0–3.0)

## 2021-01-23 NOTE — Patient Instructions (Addendum)
Pre visit review using our clinic review tool, if applicable. No additional management support is needed unless otherwise documented below in the visit note.  Increase dose today to take 1 1/2 tablets and then change weekly dose to take 1 tablet daily except 1 1/2 tablets on Monday and Thursdays  Re-check in 5 weeks

## 2021-01-23 NOTE — Telephone Encounter (Signed)
Patient requesting to reschedule coumadin appt on  02-27-2021 to 02-20-2021 @ 11:00 am  Please call patient [phone]

## 2021-01-23 NOTE — Telephone Encounter (Signed)
Pt has been RS to 2/9. Pt agreed to date and time.

## 2021-01-23 NOTE — Progress Notes (Signed)
Patient ID: [name], male   DOB: 1950-03-22, 71 y.o.   MRN: [id number]  Medical screening examination/treatment/procedure(s) were performed by non-physician practitioner and as supervising physician I was immediately available for consultation/collaboration.  I agree with above. Cathlean Cower, MD

## 2021-01-23 NOTE — Progress Notes (Signed)
Increase dose today to take 1 1/2 tablets and then change weekly dose to take 1 tablet daily except 1 1/2 tablets on Monday and Thursdays  Re-check in 5 weeks

## 2021-01-28 ENCOUNTER — Telehealth: Payer: Self-pay | Admitting: Internal Medicine

## 2021-01-28 NOTE — Telephone Encounter (Signed)
LVM for pt to rtn my call to schedule AWV with [facility]. Please schedule appt if pt calls the office.  

## 2021-02-20 ENCOUNTER — Ambulatory Visit: Payer: Medicare Other

## 2021-02-27 ENCOUNTER — Ambulatory Visit: Payer: Medicare Other

## 2021-03-03 ENCOUNTER — Telehealth: Payer: Self-pay

## 2021-03-03 MED ORDER — HYDROCHLOROTHIAZIDE 12.5 MG PO CAPS: 12.5000 mg | ORAL_CAPSULE | Freq: Every day | ORAL | 0 refills | Status: DC

## 2021-03-03 NOTE — Telephone Encounter (Signed)
Ok I sent hct 12.5 to [facility] bc [facility] would take another wk to get it to him with more fliud wt gain occurring during this time  So hopefuly he can start sooner, and ROV in 2 wks to recheck

## 2021-03-03 NOTE — Telephone Encounter (Signed)
Pt is requesting a refill on: hydrochlorothiazide (MICROZIDE) 12.5 MG capsule   Pt states he has gained 8lbs in a week.   Pharmacy: [facility] ([place], [place]  This medication is on the D/C list of meds  Pt CB [phone]

## 2021-03-03 NOTE — Telephone Encounter (Signed)
I do not see HCTZ on patient's med list

## 2021-03-04 NOTE — Telephone Encounter (Signed)
Patient notified and scheduled for 2 week recheck.

## 2021-03-06 ENCOUNTER — Ambulatory Visit (INDEPENDENT_AMBULATORY_CARE_PROVIDER_SITE_OTHER): Payer: Medicare Other

## 2021-03-06 ENCOUNTER — Other Ambulatory Visit: Payer: Self-pay

## 2021-03-06 DIAGNOSIS — Z7901 Long term (current) use of anticoagulants: Secondary | ICD-10-CM | POA: Diagnosis not present

## 2021-03-06 LAB — POCT INR: INR: 2.2 (ref 2.0–3.0)

## 2021-03-06 NOTE — Patient Instructions (Addendum)
Pre visit review using our clinic review tool, if applicable. No additional management support is needed unless otherwise documented below in the visit note.  Continue 1 tablet daily except 1 1/2 tablets on Monday and Thursdays  Re-check in 6 weeks per pt request.

## 2021-03-06 NOTE — Progress Notes (Signed)
Continue 1 tablet daily except 1 1/2 tablets on Monday and Thursdays  Re-check in 6 weeks per pt request.

## 2021-03-19 ENCOUNTER — Ambulatory Visit: Payer: Medicare Other | Admitting: Internal Medicine

## 2021-04-18 ENCOUNTER — Ambulatory Visit: Payer: Medicare Other

## 2021-04-21 ENCOUNTER — Other Ambulatory Visit: Payer: Self-pay | Admitting: Internal Medicine

## 2021-04-25 ENCOUNTER — Ambulatory Visit: Payer: Medicare Other

## 2021-04-29 ENCOUNTER — Other Ambulatory Visit: Payer: Self-pay | Admitting: Internal Medicine

## 2021-04-29 DIAGNOSIS — Z7901 Long term (current) use of anticoagulants: Secondary | ICD-10-CM

## 2021-04-29 NOTE — Telephone Encounter (Signed)
Please to ask pt to request coumadin per his coumadin clinic ?

## 2021-04-29 NOTE — Telephone Encounter (Signed)
Pt has missed last 2 apts but is RS for 4/21. Pt is compliant with PCP apts. ?Sent in refill ?

## 2021-05-02 ENCOUNTER — Ambulatory Visit: Payer: Medicare Other

## 2021-05-02 NOTE — Telephone Encounter (Signed)
Pt missed apt today for coumadin clinic. ?Contacted pt and he reports his car has been in the shop and he thought it would be finished by today. Pt said he would call when car was out of the shop. Advised the importance of having INR checked soon because it has been over 8 weeks. Pt verbalized understanding.  ?Cancelled pt apt for today. ?

## 2021-05-14 NOTE — Telephone Encounter (Signed)
Contacted pt and RS coumadin clinic apt for 5/9 at [facility]. ?

## 2021-05-20 ENCOUNTER — Ambulatory Visit (INDEPENDENT_AMBULATORY_CARE_PROVIDER_SITE_OTHER): Payer: Medicare Other

## 2021-05-20 DIAGNOSIS — Z7901 Long term (current) use of anticoagulants: Secondary | ICD-10-CM | POA: Diagnosis not present

## 2021-05-20 LAB — POCT INR: INR: 3.9 — AB (ref 2.0–3.0)

## 2021-05-20 NOTE — Patient Instructions (Addendum)
Pre visit review using our clinic review tool, if applicable. No additional management support is needed unless otherwise documented below in the visit note. ? ?Hold dose today and then continue 1 tablet daily except take 1 1/2 tablets on Mondays and Thursdays. Recheck in 3 weeks.  ?

## 2021-05-20 NOTE — Progress Notes (Signed)
Pt reports alcohol consumption this weekend; he does not normally drink alcohol. Pt also reports he has not been eating any salads. No change to weekly dose will be made due to pt being subtherapeutic and on the lower side of his range before the alcohol. ?Hold dose today and then continue 1 tablet daily except take 1 1/2 tablets on Mondays and Thursdays. Recheck in 3 weeks.  ?

## 2021-06-03 ENCOUNTER — Encounter: Payer: Self-pay | Admitting: Internal Medicine

## 2021-06-03 ENCOUNTER — Ambulatory Visit (INDEPENDENT_AMBULATORY_CARE_PROVIDER_SITE_OTHER): Payer: Medicare Other

## 2021-06-03 ENCOUNTER — Ambulatory Visit (INDEPENDENT_AMBULATORY_CARE_PROVIDER_SITE_OTHER): Payer: Medicare Other | Admitting: Internal Medicine

## 2021-06-03 VITALS — BP 130/82 | HR 85 | Temp 97.9°F | Ht 73.0 in | Wt 248.0 lb

## 2021-06-03 DIAGNOSIS — E538 Deficiency of other specified B group vitamins: Secondary | ICD-10-CM | POA: Diagnosis not present

## 2021-06-03 DIAGNOSIS — Z0001 Encounter for general adult medical examination with abnormal findings: Secondary | ICD-10-CM

## 2021-06-03 DIAGNOSIS — E785 Hyperlipidemia, unspecified: Secondary | ICD-10-CM | POA: Diagnosis not present

## 2021-06-03 DIAGNOSIS — R739 Hyperglycemia, unspecified: Secondary | ICD-10-CM

## 2021-06-03 DIAGNOSIS — M25521 Pain in right elbow: Secondary | ICD-10-CM

## 2021-06-03 DIAGNOSIS — E559 Vitamin D deficiency, unspecified: Secondary | ICD-10-CM | POA: Diagnosis not present

## 2021-06-03 DIAGNOSIS — S59901A Unspecified injury of right elbow, initial encounter: Secondary | ICD-10-CM | POA: Diagnosis not present

## 2021-06-03 DIAGNOSIS — I10 Essential (primary) hypertension: Secondary | ICD-10-CM

## 2021-06-03 DIAGNOSIS — Z125 Encounter for screening for malignant neoplasm of prostate: Secondary | ICD-10-CM

## 2021-06-03 LAB — LIPID PANEL
Cholesterol: 142 mg/dL (ref 0–200)
HDL: 53.5 mg/dL (ref >39.00)
LDL Cholesterol: 77 mg/dL (ref 0–99)
NonHDL: 88.12
Total CHOL/HDL Ratio: 3
Triglycerides: 54 mg/dL (ref 0.0–149.0)
VLDL: 10.8 mg/dL (ref 0.0–40.0)

## 2021-06-03 LAB — BASIC METABOLIC PANEL
BUN: 16 mg/dL (ref 6–23)
CO2: 27 mEq/L (ref 19–32)
Calcium: 10 mg/dL (ref 8.4–10.5)
Chloride: 100 mEq/L (ref 96–112)
Creatinine, Ser: 0.89 mg/dL (ref 0.40–1.50)
GFR: 86.36 mL/min (ref >60.00)
Glucose, Bld: 127 mg/dL — ABNORMAL HIGH (ref 70–99)
Potassium: 4 mEq/L (ref 3.5–5.1)
Sodium: 136 mEq/L (ref 135–145)

## 2021-06-03 LAB — VITAMIN B12: Vitamin B-12: 746 pg/mL (ref 211–911)

## 2021-06-03 LAB — HEPATIC FUNCTION PANEL
ALT: 16 U/L (ref 0–53)
AST: 19 U/L (ref 0–37)
Albumin: 4.1 g/dL (ref 3.5–5.2)
Alkaline Phosphatase: 120 U/L — ABNORMAL HIGH (ref 39–117)
Bilirubin, Direct: 0.3 mg/dL (ref 0.0–0.3)
Total Bilirubin: 1 mg/dL (ref 0.2–1.2)
Total Protein: 8 g/dL (ref 6.0–8.3)

## 2021-06-03 LAB — CBC WITH DIFFERENTIAL/PLATELET
Basophils Absolute: 0 10*3/uL (ref 0.0–0.1)
Basophils Relative: 0.4 % (ref 0.0–3.0)
Eosinophils Absolute: 0 10*3/uL (ref 0.0–0.7)
Eosinophils Relative: 0 % (ref 0.0–5.0)
HCT: 45.9 % (ref 39.0–52.0)
Hemoglobin: 14.9 g/dL (ref 13.0–17.0)
Lymphocytes Relative: 10.3 % — ABNORMAL LOW (ref 12.0–46.0)
Lymphs Abs: 0.9 10*3/uL (ref 0.7–4.0)
MCHC: 32.4 g/dL (ref 30.0–36.0)
MCV: 99.2 fl (ref 78.0–100.0)
Monocytes Absolute: 0.8 10*3/uL (ref 0.1–1.0)
Monocytes Relative: 9.7 % (ref 3.0–12.0)
Neutro Abs: 6.8 10*3/uL (ref 1.4–7.7)
Neutrophils Relative %: 79.6 % — ABNORMAL HIGH (ref 43.0–77.0)
Platelets: 178 10*3/uL (ref 150.0–400.0)
RBC: 4.63 Mil/uL (ref 4.22–5.81)
RDW: 15.7 % — ABNORMAL HIGH (ref 11.5–15.5)
WBC: 8.5 10*3/uL (ref 4.0–10.5)

## 2021-06-03 LAB — VITAMIN D 25 HYDROXY (VIT D DEFICIENCY, FRACTURES): VITD: 48.65 ng/mL (ref 30.00–100.00)

## 2021-06-03 LAB — TSH: TSH: 2.77 u[IU]/mL (ref 0.35–5.50)

## 2021-06-03 LAB — HEMOGLOBIN A1C: Hgb A1c MFr Bld: 5.7 % (ref 4.6–6.5)

## 2021-06-03 LAB — PSA: PSA: 0.92 ng/mL (ref 0.10–4.00)

## 2021-06-03 MED ORDER — HYDROCODONE-ACETAMINOPHEN 7.5-325 MG PO TABS: 1.0000 | ORAL_TABLET | Freq: Four times a day (QID) | ORAL | 0 refills | Status: AC | PRN

## 2021-06-03 MED ORDER — KETOROLAC TROMETHAMINE 60 MG/2ML IM SOLN
60.0000 mg | Freq: Once | INTRAMUSCULAR | Status: AC
  Administered 2021-06-03: 60 mg via INTRAMUSCULAR

## 2021-06-03 NOTE — Patient Instructions (Addendum)
You had the pain shot today (toradol)  Please take all new medication as prescribed - the hydrocodone for pain as needed  You will be contacted regarding the referral for: Sports Medicine asap  Please continue all other medications as before, and refills have been done if requested.  Please have the pharmacy call with any other refills you may need.  Please continue your efforts at being more active, low cholesterol diet, and weight control.  You are otherwise up to date with prevention measures today.  Please keep your appointments with your specialists as you may have planned  Please go to the XRAY Department in the first floor for the x-ray testing  Please go to the LAB at the blood drawing area for the tests to be done  You will be contacted by phone if any changes need to be made immediately.  Otherwise, you will receive a letter about your results with an explanation, but please check with MyChart first.  Please remember to sign up for MyChart if you have not done so, as this will be important to you in the future with finding out test results, communicating by private email, and scheduling acute appointments online when needed.  Please make an Appointment to return in 6 months, or sooner if needed

## 2021-06-03 NOTE — Assessment & Plan Note (Signed)
Lab Results  Component Value Date   LDLCALC 77 06/03/2021   Mild uncontrolled, goal ldl < 70, pt to continue current statin liitor as decliens change, for lower chol diet

## 2021-06-03 NOTE — Assessment & Plan Note (Signed)
With severe pain and swelling after lifting wts with getting exercise; exam at least suggetive of severe right lateral epicondylitis and ulnar neuritis - for toradol 30 mg IM now, right elbow xray, hydrocodone prn, and refer sport med prn,  to f/u any worsening symptoms or concerns

## 2021-06-03 NOTE — Assessment & Plan Note (Signed)
Much improved with better diet, less calories

## 2021-06-03 NOTE — Progress Notes (Signed)
Patient ID: [name], male   DOB: Oct 25, 1950, 71 y.o.   MRN: [id number]         Chief Complaint:: wellness exam and right elbow pain (After a workout) , dm       HPI:  [name] is a 71 y.o. male here for wellness exam; declines covid booster, shingrix, colonoscopy, o/w up to date                        Also has right elbow persistent pain x 3 days after gym injury with lifting wts, pain is severe with swelling and pain radiates to the right hand with some numbness and less grip strength.  Pt denies chest pain, increased sob or doe, wheezing, orthopnea, PND, increased LE swelling, palpitations, dizziness or syncope.   Pt denies polydipsia, polyuria, or new focal neuro s/s.    Pt denies fever, wt loss, night sweats, loss of appetite, or other constitutional symptoms     Wt Readings from Last 3 Encounters:  06/03/21 248 lb (112.5 kg)  09/19/20 254 lb (115.2 kg)  08/15/20 254 lb (115.2 kg)   BP Readings from Last 3 Encounters:  06/03/21 130/82  09/19/20 126/70  08/15/20 120/78   Immunization History  Administered Date(s) Administered   Fluad Quad(high Dose 65+) 09/02/2018, 09/29/2019, 09/19/2020   Influenza Split 11/03/2011   Influenza, High Dose Seasonal PF 11/01/2014, 10/02/2015, 09/09/2016, 10/01/2017   Influenza,inj,Quad PF,6+ Mos 10/18/2012, 09/26/2013   PFIZER(Purple Top)SARS-COV-2 Vaccination 02/03/2019, 02/24/2019, 04/09/2020   Pneumococcal Conjugate-13 02/05/2016   Pneumococcal Polysaccharide-23 07/09/2017   Tdap 09/08/2011   There are no preventive care reminders to display for this patient.     Past Medical History:  Diagnosis Date   AAA (abdominal aortic aneurysm) [facility]) July 2013   Abdominal Aortic Endovascular Stent Graft 08/11/11. Bilateral. Right is 3.2 & the left is 3.6. "Preclose" repair bilateral common femoral artery. Placement of catheter in aorta x 2. Repair of aorta w/modular bifurcated prosthesis w/bilateral limbs.  Placement of right limb extension:  23 mm x 12 cm. Placement of left limb extension: 14 mm x 7 cm.   Anemia    From GI bleed in 05/11/11.    Chronic anticoagulation, PE DVT PAF    Chronic pain    Depression    Dyslipidemia    Edema of left lower extremity    S/P I&D 08/30/06   GI bleed 04/2011   With anemia. EGD & colonoscopy 05/11/2011   History of alcohol dependence ([place])    HTN (hypertension)    Hyperlipemia    Hypertension    Iliac artery aneurysm, bilateral (HCC)    PVD, bilat common iliac aneurysms [443.9]   Iliopsoas muscle hematoma 04/2011   Lung nodule 2008   Morbid obesity ([place])    Morbid obesity with BMI of 40.0-44.9, adult [facility])    Obstructive sleep apnea July 2013   Not wearing CPAP. Split-Night sleep study 06/03/11 AHI = 27.43/hr (TOTAL SLEEP TIME 105 minutes) and REM AHI = 120.00/hr   Osteoarthritis of both knees    Bilateral Arthroscopy in 2000 & 2008. Both were complicated w/venous thromboembolism S/P IVC filter placement due to one being massive PE in 2008.   PAF (paroxysmal atrial fibrillation)/atrial flutter 02/28/2010   ECHO : normal LV size, moderate concentricLVH, normal EF, normal atrial sizes, RV was normal but previously dilated;;  Myoview : Normal perfusion study. Low risk scan.   PAH (pulmonary artery hypertension) ([place])  Prolonged QT interval    Psoriasis    Pulmonary embolism ([place]) 2008   DVT --> Massive PE with right-sided strain. On coumadin. ECHO 02/28/10 showed normal LV size, moderate concentricLVH, normal EF, normal atrial sizes, RV was normal but previously dilated.   Past Surgical History:  Procedure Laterality Date   ABDOMINAL AORTAGRAM N/A 05/14/2011   Procedure: ABDOMINAL AORTAGRAM;  Surgeon: Conrad Perry, MD;  Location: [facility] CATH LAB;  Service: Cardiovascular;  Laterality: N/A;   Abdominal Aortic Evar  08/11/11   Bilateral. Right is 3.2 & the left is 3.6. "Preclose" repair bilateral common femoral artery. Placement of catheter in aorta x 2. Repair of aorta w/modular bifurcated  prosthesis w/bilateral limbs.  Placement of right limb extension: 23 mm x 12 cm. Placement of left limb extension: 14 mm x 7 cm.   ACHILLES TENDON SURGERY     COLONOSCOPY  05/11/2011   For GI bleed. Performed by Dr. Juanita Craver, MD. Also had a EGD performed at this time.   COLONOSCOPY  05/12/2011   EMBOLIZATION N/A 07/30/2011   Procedure: EMBOLIZATION;  Surgeon: Conrad [place], MD;  Location: [facility] CATH LAB;  Service: Cardiovascular;  Laterality: N/A;   ESOPHAGOGASTRODUODENOSCOPY  05/08/2011   For GI bleed/Anemia. Also had a colonoscopy at this time.   INSERTION OF VENA CAVA FILTER N/A 05/14/2011   Procedure: INSERTION OF VENA CAVA FILTER;  Surgeon: Conrad [place], MD;  Location: [facility] CATH LAB;  Service: Cardiovascular;  Laterality: N/A;   KNEE ARTHROSCOPY Bilateral 2000 & 2008   DJD knees. Both were complicated w/venous thromboembolism S/P IVC filter placement due to one being massive PE in 2008.   [place]   LOWER EXTREMITY ANGIOGRAM N/A 05/14/2011   Procedure: LOWER EXTREMITY ANGIOGRAM;  Surgeon: Conrad [place], MD;  Location: [facility] CATH LAB;  Service: Cardiovascular;  Laterality: N/A;    reports that he quit smoking about 14 years ago. His smoking use included cigarettes. He has a 1.00 pack-year smoking history. He has never used smokeless tobacco. He reports current alcohol use of about 1.0 - 2.0 standard drink per week. He reports current drug use. family history includes Arthritis in his father and mother; CAD (age of onset: 18) in his father; Cancer in his brother and mother. Allergies  Allergen Reactions   Crestor [Rosuvastatin] Swelling   Current Outpatient Medications on File Prior to Visit  Medication Sig Dispense Refill   allopurinol (ZYLOPRIM) 100 MG tablet TAKE 1 TABLET BY MOUTH  DAILY 90 tablet 3   atorvastatin (LIPITOR) 40 MG tablet TAKE 1 TABLET BY MOUTH  DAILY 90 tablet 3   augmented betamethasone dipropionate (DIPROLENE-AF) 0.05 % cream Apply 1 application topically 2 (two) times  daily.     dicyclomine (BENTYL) 20 MG tablet Take 1 tablet (20 mg total) by mouth 3 (three) times daily as needed for spasms (abdominal pain/spasm). 20 tablet 0   digoxin (LANOXIN) 0.125 MG tablet TAKE 1 TABLET BY MOUTH  DAILY 90 tablet 3   diltiazem (TIAZAC) 360 MG 24 hr capsule TAKE 1 CAPSULE BY MOUTH  DAILY 90 capsule 3   hydrochlorothiazide (MICROZIDE) 12.5 MG capsule TAKE 1 CAPSULE BY MOUTH  DAILY 90 capsule 1   traZODone (DESYREL) 50 MG tablet Take 0.5-1 tablets (25-50 mg total) by mouth at bedtime as needed for sleep. 30 tablet 3   VIAGRA 100 MG tablet TAKE 1/2 TO 1 (ONE-HALF TO ONE) TABLET BY MOUTH DAILY AS NEEDED FOR  ERECTILE  DYSFUNCTION (Patient  taking differently: TAKE 1/2 TO 1 (ONE-HALF TO ONE) TABLET BY MOUTH DAILY AS NEEDED FOR  ERECTILE  DYSFUNCTION) 10 tablet 0   warfarin (COUMADIN) 5 MG tablet TAKE 1 TABLET BY MOUTH  DAILY, EXCEPT TAKE 1 AND  1/2 TABLETS ON MONDAYS AND THURSDAYS OR AS DIRECTED BY ANTICOAGULATION CLINIC. 120 tablet 1   No current facility-administered medications on file prior to visit.        ROS:  All others reviewed and negative.  Objective        PE:  BP 130/82 (BP Location: Left Arm, Patient Position: Sitting, Cuff Size: Large)   Pulse 85   Temp 97.9 F (36.6 C) (Oral)   Ht '6\' 1"'$  (1.854 m)   Wt 248 lb (112.5 kg)   SpO2 98%   BMI 32.72 kg/m                 Constitutional: Pt appears in NAD               HENT: Head: NCAT.                Right Ear: External ear normal.                 Left Ear: External ear normal.                Eyes: . Pupils are equal, round, and reactive to light. Conjunctivae and EOM are normal               Nose: without d/c or deformity               Neck: Neck supple. Gross normal ROM               Cardiovascular: Normal rate and regular rhythm.                 Pulmonary/Chest: Effort normal and breath sounds without rales or wheezing.                Abd:  Soft, NT, ND, + BS, no organomegaly               Neurological:  Pt is alert. At baseline orientation, motor grossly intact except for 4/5 right grip               Skin: Skin is warm. No rashes, no other new lesions, LE edema - none               Right elbow with diffuse 1-2+ swelling, non discrete with mod to severe tender               Psychiatric: Pt behavior is normal without agitation   Micro: none  Cardiac tracings I have personally interpreted today:  none  Pertinent Radiological findings (summarize): none   Lab Results  Component Value Date   WBC 8.5 06/03/2021   HGB 14.9 06/03/2021   HCT 45.9 06/03/2021   PLT 178.0 06/03/2021   GLUCOSE 127 (H) 06/03/2021   CHOL 142 06/03/2021   TRIG 54.0 06/03/2021   HDL 53.50 06/03/2021   LDLCALC 77 06/03/2021   ALT 16 06/03/2021   AST 19 06/03/2021   NA 136 06/03/2021   K 4.0 06/03/2021   CL 100 06/03/2021   CREATININE 0.89 06/03/2021   BUN 16 06/03/2021   CO2 27 06/03/2021   TSH 2.77 06/03/2021   PSA 0.92 06/03/2021   INR 3.9 (A) 05/20/2021   HGBA1C 5.7 06/03/2021  Assessment/Plan:  ZAYNE DRAHEIM is a 71 y.o. Black or African American [2] male with  has a past medical history of AAA (abdominal aortic aneurysm) ([place]) (July 2013), Anemia, Chronic anticoagulation, PE DVT PAF, Chronic pain, Depression, Dyslipidemia, Edema of left lower extremity, GI bleed (04/2011), History of alcohol dependence ([place]), HTN (hypertension), Hyperlipemia, Hypertension, Iliac artery aneurysm, bilateral ([place]), Iliopsoas muscle hematoma (04/2011), Lung nodule (2008), Morbid obesity ([place]), Morbid obesity with BMI of 40.0-44.9, adult [facility]), Obstructive sleep apnea (July 2013), Osteoarthritis of both knees, PAF (paroxysmal atrial fibrillation)/atrial flutter (02/28/2010), PAH (pulmonary artery hypertension) ([place]), Prolonged QT interval, Psoriasis, and Pulmonary embolism ([place]) (2008).  Encounter for well adult exam with abnormal findings Age and sex appropriate education and counseling updated with regular exercise and  diet Referrals for preventative services - declines colonoscopy for now Immunizations addressed - declines covid booster, shingrix, Smoking counseling  - none needed Evidence for depression or other mood disorder - none significant Most recent labs reviewed. I have personally reviewed and have noted: 1) the patient's medical and social history 2) The patient's current medications and supplements 3) The patient's height, weight, and BMI have been recorded in the chart   Vitamin D deficiency Last vitamin D Lab Results  Component Value Date   VD25OH 48.65 06/03/2021   Stable, cont oral replacement   Right elbow pain With severe pain and swelling after lifting wts with getting exercise; exam at least suggetive of severe right lateral epicondylitis and ulnar neuritis - for toradol 30 mg IM now, right elbow xray, hydrocodone prn, and refer sport med prn,  to f/u any worsening symptoms or concerns  Morbid obesity ([place]) Much improved with better diet, less calories  Hyperglycemia Lab Results  Component Value Date   HGBA1C 5.7 06/03/2021   Stable, pt to continue current medical treatment  - diet   HTN (hypertension) BP Readings from Last 3 Encounters:  06/03/21 130/82  09/19/20 126/70  08/15/20 120/78   Stable, pt to continue medical treatment tiazac hct   Dyslipidemia Lab Results  Component Value Date   LDLCALC 77 06/03/2021   Mild uncontrolled, goal ldl < 70, pt to continue current statin liitor as decliens change, for lower chol diet  Followup: Return in about 6 months (around 12/04/2021).  Cathlean Cower, MD 06/03/2021 10:30 PM [facility]

## 2021-06-03 NOTE — Assessment & Plan Note (Signed)
BP Readings from Last 3 Encounters:  06/03/21 130/82  09/19/20 126/70  08/15/20 120/78   Stable, pt to continue medical treatment tiazac hct

## 2021-06-03 NOTE — Assessment & Plan Note (Signed)
Last vitamin D Lab Results  Component Value Date   VD25OH 48.65 06/03/2021   Stable, cont oral replacement

## 2021-06-03 NOTE — Assessment & Plan Note (Signed)
Lab Results  Component Value Date   HGBA1C 5.7 06/03/2021   Stable, pt to continue current medical treatment  - diet

## 2021-06-03 NOTE — Assessment & Plan Note (Signed)
Age and sex appropriate education and counseling updated with regular exercise and diet Referrals for preventative services - declines colonoscopy for now Immunizations addressed - declines covid booster, shingrix, Smoking counseling  - none needed Evidence for depression or other mood disorder - none significant Most recent labs reviewed. I have personally reviewed and have noted: 1) the patient's medical and social history 2) The patient's current medications and supplements 3) The patient's height, weight, and BMI have been recorded in the chart

## 2021-06-04 ENCOUNTER — Encounter: Payer: Self-pay | Admitting: Internal Medicine

## 2021-06-11 ENCOUNTER — Ambulatory Visit: Payer: Medicare Other | Admitting: Family Medicine

## 2021-06-13 ENCOUNTER — Ambulatory Visit: Payer: Medicare Other

## 2021-06-20 ENCOUNTER — Ambulatory Visit (INDEPENDENT_AMBULATORY_CARE_PROVIDER_SITE_OTHER): Payer: Medicare Other

## 2021-06-20 DIAGNOSIS — Z7901 Long term (current) use of anticoagulants: Secondary | ICD-10-CM

## 2021-06-20 LAB — POCT INR: INR: 2.3 (ref 2.0–3.0)

## 2021-06-20 NOTE — Progress Notes (Signed)
Continue 1 tablet daily except take 1 1/2 tablets on Mondays and Thursdays. Recheck in 6 weeks.  

## 2021-06-20 NOTE — Patient Instructions (Addendum)
Pre visit review using our clinic review tool, if applicable. No additional management support is needed unless otherwise documented below in the visit note.  Continue 1 tablet daily except take 1 1/2 tablets on Mondays and Thursdays. Recheck in 6 weeks.  

## 2021-06-23 ENCOUNTER — Ambulatory Visit (INDEPENDENT_AMBULATORY_CARE_PROVIDER_SITE_OTHER): Payer: Medicare Other

## 2021-06-23 DIAGNOSIS — Z0000 Encounter for general adult medical examination without abnormal findings: Secondary | ICD-10-CM | POA: Diagnosis not present

## 2021-06-23 NOTE — Progress Notes (Signed)
Subjective:   [name] is a 71 y.o. male who presents for an Subsequent Medicare Annual Wellness Visit.   I connected with Tomie China  today by telephone and verified that I am speaking with the correct person using two identifiers. Location patient: home Location provider: work Persons participating in the virtual visit: patient, provider.   I discussed the limitations, risks, security and privacy concerns of performing an evaluation and management service by telephone and the availability of in person appointments. I also discussed with the patient that there may be a patient responsible charge related to this service. The patient expressed understanding and verbally consented to this telephonic visit.    Interactive audio and video telecommunications were attempted between this provider and patient, however failed, due to patient having technical difficulties OR patient did not have access to video capability.  We continued and completed visit with audio only.    Review of Systems     Cardiac Risk Factors include: advanced age (>37mn, >>31women);male gender;dyslipidemia     Objective:    Today's Vitals   There is no height or weight on file to calculate BMI.     06/23/2021   10:56 AM 08/08/2020    1:47 PM 10/12/2019    9:49 AM 08/14/2016   12:15 PM 11/01/2015    8:45 AM 10/26/2014    8:55 AM 10/20/2013    9:09 AM  Advanced Directives  Does Patient Have a Medical Advance Directive? Yes No Yes No No No No  Type of AParamedicof [place]Living will  [place]Living will      Does patient want to make changes to medical advance directive?   No - Patient declined      Copy of [place] Chart? No - copy requested  No - copy requested      Would patient like information on creating a medical advance directive?  No - Patient declined  No - Patient declined No - patient declined information No - patient declined  information No - patient declined information    Current Medications (verified) Outpatient Encounter Medications as of 06/23/2021  Medication Sig   allopurinol (ZYLOPRIM) 100 MG tablet TAKE 1 TABLET BY MOUTH  DAILY   atorvastatin (LIPITOR) 40 MG tablet TAKE 1 TABLET BY MOUTH  DAILY   augmented betamethasone dipropionate (DIPROLENE-AF) 0.05 % cream Apply 1 application topically 2 (two) times daily.   digoxin (LANOXIN) 0.125 MG tablet TAKE 1 TABLET BY MOUTH  DAILY   diltiazem (TIAZAC) 360 MG 24 hr capsule TAKE 1 CAPSULE BY MOUTH  DAILY   hydrochlorothiazide (MICROZIDE) 12.5 MG capsule TAKE 1 CAPSULE BY MOUTH  DAILY   warfarin (COUMADIN) 5 MG tablet TAKE 1 TABLET BY MOUTH  DAILY, EXCEPT TAKE 1 AND  1/2 TABLETS ON MONDAYS AND THURSDAYS OR AS DIRECTED BY ANTICOAGULATION CLINIC.   dicyclomine (BENTYL) 20 MG tablet Take 1 tablet (20 mg total) by mouth 3 (three) times daily as needed for spasms (abdominal pain/spasm). (Patient not taking: Reported on 06/23/2021)   HYDROcodone-acetaminophen (NORCO) 7.5-325 MG tablet Take 1 tablet by mouth every 6 (six) hours as needed for moderate pain. (Patient not taking: Reported on 06/23/2021)   traZODone (DESYREL) 50 MG tablet Take 0.5-1 tablets (25-50 mg total) by mouth at bedtime as needed for sleep. (Patient not taking: Reported on 06/23/2021)   VIAGRA 100 MG tablet TAKE 1/2 TO 1 (ONE-HALF TO ONE) TABLET BY MOUTH DAILY AS NEEDED  FOR  ERECTILE  DYSFUNCTION (Patient not taking: Reported on 06/23/2021)   No facility-administered encounter medications on file as of 06/23/2021.    Allergies (verified) Crestor [rosuvastatin]   History: Past Medical History:  Diagnosis Date   AAA (abdominal aortic aneurysm) [facility]) July 2013   Abdominal Aortic Endovascular Stent Graft 08/11/11. Bilateral. Right is 3.2 & the left is 3.6. "Preclose" repair bilateral common femoral artery. Placement of catheter in aorta x 2. Repair of aorta w/modular bifurcated prosthesis w/bilateral limbs.   Placement of right limb extension: 23 mm x 12 cm. Placement of left limb extension: 14 mm x 7 cm.   Anemia    From GI bleed in 05/11/11.    Chronic anticoagulation, PE DVT PAF    Chronic pain    Depression    Dyslipidemia    Edema of left lower extremity    S/P I&D 08/30/06   GI bleed 04/2011   With anemia. EGD & colonoscopy 05/11/2011   History of alcohol dependence ([place])    HTN (hypertension)    Hyperlipemia    Hypertension    Iliac artery aneurysm, bilateral (HCC)    PVD, bilat common iliac aneurysms [443.9]   Iliopsoas muscle hematoma 04/2011   Lung nodule 2008   Morbid obesity ([place])    Morbid obesity with BMI of 40.0-44.9, adult [facility])    Obstructive sleep apnea July 2013   Not wearing CPAP. Split-Night sleep study 06/03/11 AHI = 27.43/hr (TOTAL SLEEP TIME 105 minutes) and REM AHI = 120.00/hr   Osteoarthritis of both knees    Bilateral Arthroscopy in 2000 & 2008. Both were complicated w/venous thromboembolism S/P IVC filter placement due to one being massive PE in 2008.   PAF (paroxysmal atrial fibrillation)/atrial flutter 02/28/2010   ECHO : normal LV size, moderate concentricLVH, normal EF, normal atrial sizes, RV was normal but previously dilated;;  Myoview : Normal perfusion study. Low risk scan.   PAH (pulmonary artery hypertension) (HCC)    Prolonged QT interval    Psoriasis    Pulmonary embolism ([place]) 2008   DVT --> Massive PE with right-sided strain. On coumadin. ECHO 02/28/10 showed normal LV size, moderate concentricLVH, normal EF, normal atrial sizes, RV was normal but previously dilated.   Past Surgical History:  Procedure Laterality Date   ABDOMINAL AORTAGRAM N/A 05/14/2011   Procedure: ABDOMINAL AORTAGRAM;  Surgeon: Conrad [place], MD;  Location: [facility] CATH LAB;  Service: Cardiovascular;  Laterality: N/A;   Abdominal Aortic Evar  08/11/11   Bilateral. Right is 3.2 & the left is 3.6. "Preclose" repair bilateral common femoral artery. Placement of catheter in aorta x 2. Repair  of aorta w/modular bifurcated prosthesis w/bilateral limbs.  Placement of right limb extension: 23 mm x 12 cm. Placement of left limb extension: 14 mm x 7 cm.   ACHILLES TENDON SURGERY     COLONOSCOPY  05/11/2011   For GI bleed. Performed by Dr. Juanita Craver, MD. Also had a EGD performed at this time.   COLONOSCOPY  05/12/2011   EMBOLIZATION N/A 07/30/2011   Procedure: EMBOLIZATION;  Surgeon: Conrad [place], MD;  Location: [facility] CATH LAB;  Service: Cardiovascular;  Laterality: N/A;   ESOPHAGOGASTRODUODENOSCOPY  05/08/2011   For GI bleed/Anemia. Also had a colonoscopy at this time.   INSERTION OF VENA CAVA FILTER N/A 05/14/2011   Procedure: INSERTION OF VENA CAVA FILTER;  Surgeon: Conrad , MD;  Location: [facility] CATH LAB;  Service: Cardiovascular;  Laterality: N/A;   KNEE ARTHROSCOPY Bilateral 2000 &  2008   DJD knees. Both were complicated w/venous thromboembolism S/P IVC filter placement due to one being massive PE in 2008.   [place]   LOWER EXTREMITY ANGIOGRAM N/A 05/14/2011   Procedure: LOWER EXTREMITY ANGIOGRAM;  Surgeon: Conrad [place], MD;  Location: [facility] CATH LAB;  Service: Cardiovascular;  Laterality: N/A;   Family History  Problem Relation Age of Onset   Cancer Mother        Pancreatic   Arthritis Mother    Arthritis Father    CAD Father 26   Cancer Brother        Colon   Fainting Neg Hx    Social History   Socioeconomic History   Marital status: Divorced    Spouse name: Not on file   Number of children: 1   Years of education: Hotel manager   Highest education level: Not on file  Occupational History   Occupation: Truck Estate manager/land agent: [employer]  Tobacco Use   Smoking status: Former    Packs/day: 0.50    Years: 2.00    Total pack years: 1.00    Types: Cigarettes    Quit date: 07/05/2006    Years since quitting: 14.9   Smokeless tobacco: Never  Vaping Use   Vaping Use: Never used  Substance and Sexual Activity   Alcohol use: Yes    Alcohol/week: 1.0 - 2.0  standard drink of alcohol    Types: 1 - 2 Shots of liquor per week   Drug use: Yes   Sexual activity: Yes  Other Topics Concern   Not on file  Social History Narrative   He is a divorced father of one, grandfather 2. He tries to exercise, and had been doing it 6 days a week. He had a fall earlier last year inserted got out of the habit. He also working on his diet and sort of got out of that habit as well. He did quit drinking a few years ago and does not smoke.   Social Determinants of Health   Financial Resource Strain: Low Risk  (06/23/2021)   Overall Financial Resource Strain (CARDIA)    Difficulty of Paying Living Expenses: Not hard at all  Food Insecurity: No Food Insecurity (06/23/2021)   Hunger Vital Sign    Worried About Running Out of Food in the Last Year: Never true    Ran Out of Food in the Last Year: Never true  Transportation Needs: No Transportation Needs (06/23/2021)   PRAPARE - Hydrologist (Medical): No    Lack of Transportation (Non-Medical): No  Physical Activity: Sufficiently Active (06/23/2021)   Exercise Vital Sign    Days of Exercise per Week: 5 days    Minutes of Exercise per Session: 30 min  Stress: No Stress Concern Present (06/23/2021)   [place]    Feeling of Stress : Not at all  Social Connections: Socially Isolated (06/23/2021)   Social Connection and Isolation Panel [NHANES]    Frequency of Communication with Friends and Family: Twice a week    Frequency of Social Gatherings with Friends and Family: Twice a week    Attends Religious Services: Never    Marine scientist or Organizations: No    Attends Archivist Meetings: Never    Marital Status: Divorced    Tobacco Counseling Counseling given: Not Answered   Clinical Intake:  Pre-visit preparation completed: Yes  Pain : No/denies pain     Nutritional Risks: None Diabetes:  No  How often do you need to have someone help you when you read instructions, pamphlets, or other written materials from your doctor or pharmacy?: 1 - Never What is the last grade level you completed in school?: [place]?: No  Information entered by :: [place] [facility] Daily Living    06/23/2021   10:58 AM  In your present state of health, do you have any difficulty performing the following activities:  Hearing? 0  Vision? 0  Difficulty concentrating or making decisions? 0  Walking or climbing stairs? 0  Dressing or bathing? 0  Doing errands, shopping? 0  Preparing Food and eating ? N  Using the Toilet? N  In the past six months, have you accidently leaked urine? N  Do you have problems with loss of bowel control? N  Managing your Medications? N  Managing your Finances? N  Housekeeping or managing your Housekeeping? N    Patient Care Team: Biagio Borg, MD as PCP - General (Internal Medicine)  Indicate any recent Medical Services you may have received from other than [facility] providers in the past year (date may be approximate).     Assessment:   This is a routine wellness examination for [place].  Hearing/Vision screen Vision Screening - Comments:: Annual eye exams   Dietary issues and exercise activities discussed: Current Exercise Habits: Home exercise routine, Type of exercise: walking, Time (Minutes): 30, Frequency (Times/Week): 5, Weekly Exercise (Minutes/Week): 150, Intensity: Mild, Exercise limited by: None identified   Goals Addressed   None    Depression Screen    06/23/2021   10:58 AM 06/03/2021    3:45 PM 09/19/2020   10:07 AM 10/12/2019    9:50 AM 08/22/2018    9:16 AM 07/09/2017    8:49 AM 09/09/2016   10:53 AM  PHQ 2/9 Scores  PHQ - 2 Score 0 0 1 0 1 1 0  PHQ- 9 Score       0    Fall Risk    06/23/2021   10:57 AM 06/03/2021    3:45 PM 09/19/2020   10:07 AM 10/12/2019   10:03 AM 03/29/2019   10:53 AM  Fall  Risk   Falls in the past year? 0 0 0 1 0  Number falls in past yr: 0 0 0 0   Injury with Fall? 0 0 0 0   Risk for fall due to :    Other (Comment)   Risk for fall due to: Comment    accidently tripped; no injuries; no ER visit   Follow up Falls evaluation completed   Falls evaluation completed     [place]:  Any stairs in or around the home? Yes  If so, are there any without handrails? No  Home free of loose throw rugs in walkways, pet beds, electrical cords, etc? Yes  Adequate lighting in your home to reduce risk of falls? Yes   ASSISTIVE DEVICES UTILIZED TO PREVENT FALLS:  Life alert?  no Use of a cane, walker or w/c? No  Grab bars in the bathroom? No  Shower chair or bench in shower? No  Elevated toilet seat or a handicapped toilet? No    Cognitive Function:    Normal cognitive status assessed by telephone conversation  by this Nurse Health Advisor. No abnormalities found.      Immunizations  Immunization History  Administered Date(s) Administered   Fluad Quad(high Dose 65+) 09/02/2018, 09/29/2019, 09/19/2020   Influenza Split 11/03/2011   Influenza, High Dose Seasonal PF 11/01/2014, 10/02/2015, 09/09/2016, 10/01/2017   Influenza,inj,Quad PF,6+ Mos 10/18/2012, 09/26/2013   PFIZER(Purple Top)SARS-COV-2 Vaccination 02/03/2019, 02/24/2019, 04/09/2020   Pneumococcal Conjugate-13 02/05/2016   Pneumococcal Polysaccharide-23 07/09/2017   Tdap 09/08/2011    TDAP status: Up to date  Flu Vaccine status: Up to date  Pneumococcal vaccine status: Up to date  Covid-19 vaccine status: Completed vaccines  Qualifies for Shingles Vaccine? Yes   Zostavax completed No   Shingrix Completed?: No.    Education has been provided regarding the importance of this vaccine. Patient has been advised to call insurance company to determine out of pocket expense if they have not yet received this vaccine. Advised may also receive vaccine at local pharmacy or  Health Dept. Verbalized acceptance and understanding.  Screening Tests Health Maintenance  Topic Date Due   COVID-19 Vaccine (4 - Pfizer series) 06/04/2020   Zoster Vaccines- Shingrix (1 of 2) 09/03/2021 (Originally 02/26/2000)   COLONOSCOPY (Pts 45-28yr Insurance coverage will need to be confirmed)  06/04/2022 (Originally 05/11/2021)   INFLUENZA VACCINE  08/12/2021   TETANUS/TDAP  09/07/2021   Pneumonia Vaccine 71 Years old  Completed   Hepatitis C Screening  Completed   HPV VACCINES  Aged Out    Health Maintenance  Health Maintenance Due  Topic Date Due   COVID-19 Vaccine ([place]) 06/04/2020    Colorectal cancer screening: Type of screening: Colonoscopy. Completed 05/12/2011. Repeat every 10 years  Lung Cancer Screening: (Low Dose CT Chest recommended if Age 71-80years, 30 pack-year currently smoking OR have quit w/in 15years.) does not qualify.   Lung Cancer Screening Referral: n/a  Additional Screening:  Hepatitis C Screening: does not qualify;   Vision Screening: Recommended annual ophthalmology exams for early detection of glaucoma and other disorders of the eye. Is the patient up to date with their annual eye exam?  Yes  Who is the provider or what is the name of the office in which the patient attends annual eye exams? Unknown  If pt is not established with a provider, would they like to be referred to a provider to establish care? No .   Dental Screening: Recommended annual dental exams for proper oral hygiene  Community Resource Referral / Chronic Care Management: CRR required this visit?  No   CCM required this visit?  No      Plan:     I have personally reviewed and noted the following in the patient's chart:   Medical and social history Use of alcohol, tobacco or illicit drugs  Current medications and supplements including opioid prescriptions. Patient is not currently taking opioid prescriptions. Functional ability and  status Nutritional status Physical activity Advanced directives List of other physicians Hospitalizations, surgeries, and ER visits in previous 12 months Vitals Screenings to include cognitive, depression, and falls Referrals and appointments  In addition, I have reviewed and discussed with patient certain preventive protocols, quality metrics, and best practice recommendations. A written personalized care plan for preventive services as well as general preventive health recommendations were provided to patient.     LRandel Pigg LPN   [id number]/12/[id number]  Nurse Notes: none

## 2021-06-23 NOTE — Patient Instructions (Signed)
[name] , Thank you for taking time to come for your Medicare Wellness Visit. I appreciate your ongoing commitment to your health goals. Please review the following plan we discussed and let me know if I can assist you in the future.   Screening recommendations/referrals: Colonoscopy: 05/12/2011 Recommended yearly ophthalmology/optometry visit for glaucoma screening and checkup Recommended yearly dental visit for hygiene and checkup  Vaccinations: Influenza vaccine: completed  Pneumococcal vaccine: completed  Tdap vaccine: 09/08/2011 Shingles vaccine: will consider     Advanced directives: yes   Conditions/risks identified: none   Next appointment: none   Preventive Care 69 Years and Older, Male Preventive care refers to lifestyle choices and visits with your health care provider that can promote health and wellness. What does preventive care include? A yearly physical exam. This is also called an annual well check. Dental exams once or twice a year. Routine eye exams. Ask your health care provider how often you should have your eyes checked. Personal lifestyle choices, including: Daily care of your teeth and gums. Regular physical activity. Eating a healthy diet. Avoiding tobacco and drug use. Limiting alcohol use. Practicing safe sex. Taking low doses of aspirin every day. Taking vitamin and mineral supplements as recommended by your health care provider. What happens during an annual well check? The services and screenings done by your health care provider during your annual well check will depend on your age, overall health, lifestyle risk factors, and family history of disease. Counseling  Your health care provider may ask you questions about your: Alcohol use. Tobacco use. Drug use. Emotional well-being. Home and relationship well-being. Sexual activity. Eating habits. History of falls. Memory and ability to understand (cognition). Work and work  Statistician. Screening  You may have the following tests or measurements: Height, weight, and BMI. Blood pressure. Lipid and cholesterol levels. These may be checked every 5 years, or more frequently if you are over 38 years old. Skin check. Lung cancer screening. You may have this screening every year starting at age 50 if you have a 30-pack-year history of smoking and currently smoke or have quit within the past 15 years. Fecal occult blood test (FOBT) of the stool. You may have this test every year starting at age 28. Flexible sigmoidoscopy or colonoscopy. You may have a sigmoidoscopy every 5 years or a colonoscopy every 10 years starting at age 73. Prostate cancer screening. Recommendations will vary depending on your family history and other risks. Hepatitis C blood test. Hepatitis B blood test. Sexually transmitted disease (STD) testing. Diabetes screening. This is done by checking your blood sugar (glucose) after you have not eaten for a while (fasting). You may have this done every 1-3 years. Abdominal aortic aneurysm (AAA) screening. You may need this if you are a current or former smoker. Osteoporosis. You may be screened starting at age 26 if you are at high risk. Talk with your health care provider about your test results, treatment options, and if necessary, the need for more tests. Vaccines  Your health care provider may recommend certain vaccines, such as: Influenza vaccine. This is recommended every year. Tetanus, diphtheria, and acellular pertussis (Tdap, Td) vaccine. You may need a Td booster every 10 years. Zoster vaccine. You may need this after age 101. Pneumococcal 13-valent conjugate (PCV13) vaccine. One dose is recommended after age 81. Pneumococcal polysaccharide (PPSV23) vaccine. One dose is recommended after age 16. Talk to your health care provider about which screenings and vaccines you need and how often you  need them. This information is not intended to replace  advice given to you by your health care provider. Make sure you discuss any questions you have with your health care provider. Document Released: 01/25/2015 Document Revised: 09/18/2015 Document Reviewed: 10/30/2014 Elsevier Interactive Patient Education  2017 [place] Prevention in the Home Falls can cause injuries. They can happen to people of all ages. There are many things you can do to make your home safe and to help prevent falls. What can I do on the outside of my home? Regularly fix the edges of walkways and driveways and fix any cracks. Remove anything that might make you trip as you walk through a door, such as a raised step or threshold. Trim any bushes or trees on the path to your home. Use bright outdoor lighting. Clear any walking paths of anything that might make someone trip, such as rocks or tools. Regularly check to see if handrails are loose or broken. Make sure that both sides of any steps have handrails. Any raised decks and porches should have guardrails on the edges. Have any leaves, snow, or ice cleared regularly. Use sand or salt on walking paths during winter. Clean up any spills in your garage right away. This includes oil or grease spills. What can I do in the bathroom? Use night lights. Install grab bars by the toilet and in the tub and shower. Do not use towel bars as grab bars. Use non-skid mats or decals in the tub or shower. If you need to sit down in the shower, use a plastic, non-slip stool. Keep the floor dry. Clean up any water that spills on the floor as soon as it happens. Remove soap buildup in the tub or shower regularly. Attach bath mats securely with double-sided non-slip rug tape. Do not have throw rugs and other things on the floor that can make you trip. What can I do in the bedroom? Use night lights. Make sure that you have a light by your bed that is easy to reach. Do not use any sheets or blankets that are too big for your bed.  They should not hang down onto the floor. Have a firm chair that has side arms. You can use this for support while you get dressed. Do not have throw rugs and other things on the floor that can make you trip. What can I do in the kitchen? Clean up any spills right away. Avoid walking on wet floors. Keep items that you use a lot in easy-to-reach places. If you need to reach something above you, use a strong step stool that has a grab bar. Keep electrical cords out of the way. Do not use floor polish or wax that makes floors slippery. If you must use wax, use non-skid floor wax. Do not have throw rugs and other things on the floor that can make you trip. What can I do with my stairs? Do not leave any items on the stairs. Make sure that there are handrails on both sides of the stairs and use them. Fix handrails that are broken or loose. Make sure that handrails are as long as the stairways. Check any carpeting to make sure that it is firmly attached to the stairs. Fix any carpet that is loose or worn. Avoid having throw rugs at the top or bottom of the stairs. If you do have throw rugs, attach them to the floor with carpet tape. Make sure that you have a light switch  at the top of the stairs and the bottom of the stairs. If you do not have them, ask someone to add them for you. What else can I do to help prevent falls? Wear shoes that: Do not have high heels. Have rubber bottoms. Are comfortable and fit you well. Are closed at the toe. Do not wear sandals. If you use a stepladder: Make sure that it is fully opened. Do not climb a closed stepladder. Make sure that both sides of the stepladder are locked into place. Ask someone to hold it for you, if possible. Clearly mark and make sure that you can see: Any grab bars or handrails. First and last steps. Where the edge of each step is. Use tools that help you move around (mobility aids) if they are needed. These  include: Canes. Walkers. Scooters. Crutches. Turn on the lights when you go into a dark area. Replace any light bulbs as soon as they burn out. Set up your furniture so you have a clear path. Avoid moving your furniture around. If any of your floors are uneven, fix them. If there are any pets around you, be aware of where they are. Review your medicines with your doctor. Some medicines can make you feel dizzy. This can increase your chance of falling. Ask your doctor what other things that you can do to help prevent falls. This information is not intended to replace advice given to you by your health care provider. Make sure you discuss any questions you have with your health care provider. Document Released: 10/25/2008 Document Revised: 06/06/2015 Document Reviewed: 02/02/2014 Elsevier Interactive Patient Education  2017 [employer].

## 2021-06-29 ENCOUNTER — Other Ambulatory Visit: Payer: Self-pay | Admitting: Internal Medicine

## 2021-08-01 ENCOUNTER — Ambulatory Visit: Payer: Medicare Other

## 2021-08-01 ENCOUNTER — Telehealth: Payer: Self-pay

## 2021-08-01 NOTE — Telephone Encounter (Signed)
Pt NS coumadin clinic apt today.  LVM for pt to call back to [facility]

## 2021-08-08 ENCOUNTER — Ambulatory Visit (INDEPENDENT_AMBULATORY_CARE_PROVIDER_SITE_OTHER): Payer: Medicare Other

## 2021-08-08 DIAGNOSIS — Z7901 Long term (current) use of anticoagulants: Secondary | ICD-10-CM | POA: Diagnosis not present

## 2021-08-08 LAB — POCT INR: INR: 2.6 (ref 2.0–3.0)

## 2021-08-08 NOTE — Patient Instructions (Addendum)
Pre visit review using our clinic review tool, if applicable. No additional management support is needed unless otherwise documented below in the visit note.  Continue 1 tablet daily except take 1 1/2 tablets on Mondays and Thursdays. Recheck in 6 weeks.  

## 2021-08-08 NOTE — Progress Notes (Signed)
Continue 1 tablet daily except take 1 1/2 tablets on Mondays and Thursdays. Recheck in 6 weeks.  

## 2021-09-14 ENCOUNTER — Other Ambulatory Visit: Payer: Self-pay | Admitting: Internal Medicine

## 2021-09-15 NOTE — Telephone Encounter (Signed)
Please refill as per office routine med refill policy (all routine meds to be refilled for 3 mo or monthly (per pt preference) up to one year from last visit, then month to month grace period for 3 mo, then further med refills will have to be denied) ? ?

## 2021-09-19 ENCOUNTER — Ambulatory Visit (INDEPENDENT_AMBULATORY_CARE_PROVIDER_SITE_OTHER): Payer: Medicare Other

## 2021-09-19 DIAGNOSIS — Z7901 Long term (current) use of anticoagulants: Secondary | ICD-10-CM | POA: Diagnosis not present

## 2021-09-19 DIAGNOSIS — Z23 Encounter for immunization: Secondary | ICD-10-CM

## 2021-09-19 LAB — POCT INR: INR: 1.7 — AB (ref 2.0–3.0)

## 2021-09-19 NOTE — Progress Notes (Signed)
Take 1.5 tablets today, then continue 1 tablet daily except take 1 1/2 tablets on Mondays and Thursdays. Recheck in 3 weeks.   High-dose influenza vaccine given today in left deltoid. Pt tolerated well.

## 2021-09-19 NOTE — Patient Instructions (Addendum)
Pre visit review using our clinic review tool, if applicable. No additional management support is needed unless otherwise documented below in the visit note.   Take 1.5 tablets today, then continue 1 tablet daily except take 1 1/2 tablets on Mondays and Thursdays. Recheck in 3 weeks.

## 2021-10-10 ENCOUNTER — Ambulatory Visit: Payer: Medicare Other

## 2021-10-17 ENCOUNTER — Ambulatory Visit (INDEPENDENT_AMBULATORY_CARE_PROVIDER_SITE_OTHER): Payer: Medicare Other

## 2021-10-17 DIAGNOSIS — Z7901 Long term (current) use of anticoagulants: Secondary | ICD-10-CM | POA: Diagnosis not present

## 2021-10-17 LAB — POCT INR: INR: 2.7 (ref 2.0–3.0)

## 2021-10-17 NOTE — Progress Notes (Signed)
Continue 1 tablet daily except take 1 1/2 tablets on Mondays and Thursdays. Recheck in 6 weeks.  

## 2021-10-17 NOTE — Patient Instructions (Signed)
Continue 1 tablet daily except take 1 1/2 tablets on Mondays and Thursdays. Recheck in 6 weeks.  

## 2021-11-18 DIAGNOSIS — L400 Psoriasis vulgaris: Secondary | ICD-10-CM | POA: Diagnosis not present

## 2021-11-20 ENCOUNTER — Other Ambulatory Visit: Payer: Self-pay | Admitting: Internal Medicine

## 2021-11-20 DIAGNOSIS — Z7901 Long term (current) use of anticoagulants: Secondary | ICD-10-CM

## 2021-11-21 NOTE — Telephone Encounter (Signed)
Ok to ask pt to request refill per his coumadin clinic

## 2021-11-28 ENCOUNTER — Ambulatory Visit: Payer: Medicare Other

## 2021-12-03 ENCOUNTER — Telehealth: Payer: Self-pay

## 2021-12-03 ENCOUNTER — Other Ambulatory Visit: Payer: Self-pay

## 2021-12-03 MED ORDER — HYDROCHLOROTHIAZIDE 12.5 MG PO CAPS: 12.5000 mg | ORAL_CAPSULE | Freq: Every day | ORAL | 1 refills | Status: DC

## 2021-12-03 NOTE — Telephone Encounter (Signed)
Please clarify - which one? Or all

## 2021-12-03 NOTE — Telephone Encounter (Signed)
Rx refill

## 2021-12-09 ENCOUNTER — Ambulatory Visit: Payer: Medicare Other | Admitting: Internal Medicine

## 2021-12-15 NOTE — Telephone Encounter (Signed)
Med was send in 12/03/21 hydrochlorothiazide (MICROZIDE) 12.5 MG capsule

## 2021-12-16 ENCOUNTER — Telehealth: Payer: Self-pay | Admitting: Internal Medicine

## 2021-12-16 NOTE — Telephone Encounter (Signed)
Pt called asking if he should get the 5th covid shot. Please advise pt [phone]

## 2021-12-16 NOTE — Telephone Encounter (Signed)
Ok to let pt know, he can have the booster done, but it is no longer highly recommended that he do so (I dont think at this time it is necessary)

## 2021-12-16 NOTE — Telephone Encounter (Signed)
Patient would like to know if 5th covid vaccine is recommended, please advise.

## 2021-12-17 NOTE — Telephone Encounter (Signed)
Spoke with patient in response to getting covid booster

## 2021-12-19 ENCOUNTER — Ambulatory Visit: Payer: Medicare Other

## 2021-12-19 ENCOUNTER — Telehealth: Payer: Self-pay

## 2021-12-19 NOTE — Telephone Encounter (Signed)
Pt overdue for coumadin clinic apt. Last INR check was 10/17/21 and pt has RS apts frequently and was to have an apt today but RS it also. Pt made another apt for 3 weeks from today for INR check but pt needs to be seen sooner than that for warfarin management.  LVM

## 2021-12-26 NOTE — Telephone Encounter (Signed)
Contacted pt who reports he has had transportation issues and has been unable to get to coumadin clinic apts.  Pt agreed to move apt for coumadin clinic from 12/29 to 12/19 @ 2pm.  Advised if anything changed to contact the coumadin clinic. Pt verbalized understanding.

## 2021-12-26 NOTE — Telephone Encounter (Signed)
Pt called back to report he cannot get transportation for 12/19 and will need to move his apt back to 12/29. RS apt back to 12/29.

## 2021-12-30 ENCOUNTER — Ambulatory Visit: Payer: Medicare Other

## 2022-01-09 ENCOUNTER — Ambulatory Visit: Payer: Medicare Other

## 2022-01-09 ENCOUNTER — Ambulatory Visit (INDEPENDENT_AMBULATORY_CARE_PROVIDER_SITE_OTHER): Payer: Medicare Other

## 2022-01-09 DIAGNOSIS — Z7901 Long term (current) use of anticoagulants: Secondary | ICD-10-CM | POA: Diagnosis not present

## 2022-01-09 LAB — POCT INR: INR: 2.7 (ref 2.0–3.0)

## 2022-01-09 NOTE — Progress Notes (Signed)
Continue 1 tablet daily except take 1 1/2 tablets on Mondays and Thursdays. Recheck in 6 weeks.  

## 2022-01-09 NOTE — Patient Instructions (Signed)
Continue 1 tablet daily except take 1 1/2 tablets on Mondays and Thursdays. Recheck in 6 weeks, on Feb 9 at 11:00.

## 2022-01-30 ENCOUNTER — Ambulatory Visit (INDEPENDENT_AMBULATORY_CARE_PROVIDER_SITE_OTHER): Payer: Medicare Other

## 2022-01-30 ENCOUNTER — Encounter: Payer: Self-pay | Admitting: Family Medicine

## 2022-01-30 ENCOUNTER — Ambulatory Visit (INDEPENDENT_AMBULATORY_CARE_PROVIDER_SITE_OTHER): Payer: Medicare Other | Admitting: Family Medicine

## 2022-01-30 VITALS — BP 128/80 | HR 56 | Temp 97.3°F | Ht 73.0 in | Wt 235.0 lb

## 2022-01-30 DIAGNOSIS — M25432 Effusion, left wrist: Secondary | ICD-10-CM

## 2022-01-30 DIAGNOSIS — M25532 Pain in left wrist: Secondary | ICD-10-CM

## 2022-01-30 MED ORDER — DICLOFENAC SODIUM 1 % EX GEL: 2.0000 g | Freq: Four times a day (QID) | CUTANEOUS | 0 refills | Status: AC

## 2022-01-30 NOTE — Progress Notes (Signed)
Subjective:     Patient ID: [name], male    DOB: 12-15-1950, 72 y.o.   MRN: [id number]  Chief Complaint  Patient presents with   Hand Pain    Left thumb achy pain since yesterday. Wondering if may be due to using row machine at the [facility], cannot pick up anything     HPI Patient is in today for acute onset of pain of left wrist and thumb joint since yesterday. Symptoms woke him up. Denies injury.  States hydrocodone 7.5 mg does not help. States he needs something for pain.  Denies numbness, tingling or weakness but he is unable to move his hand as usual.   He takes warfarin.   Hx of gout and is taking allopurinol daily.   No other arthralgias or myalgias.  No fever, chills, N/V/D.    Health Maintenance Due  Topic Date Due   DTaP/Tdap/Td (2 - Td or Tdap) 09/07/2021    Past Medical History:  Diagnosis Date   AAA (abdominal aortic aneurysm) [facility]) July 2013   Abdominal Aortic Endovascular Stent Graft 08/11/11. Bilateral. Right is 3.2 & the left is 3.6. "Preclose" repair bilateral common femoral artery. Placement of catheter in aorta x 2. Repair of aorta w/modular bifurcated prosthesis w/bilateral limbs.  Placement of right limb extension: 23 mm x 12 cm. Placement of left limb extension: 14 mm x 7 cm.   Anemia    From GI bleed in 05/11/11.    Chronic anticoagulation, PE DVT PAF    Chronic pain    Depression    Dyslipidemia    Edema of left lower extremity    S/P I&D 08/30/06   GI bleed 04/2011   With anemia. EGD & colonoscopy 05/11/2011   History of alcohol dependence ([place])    HTN (hypertension)    Hyperlipemia    Hypertension    Iliac artery aneurysm, bilateral (HCC)    PVD, bilat common iliac aneurysms [443.9]   Iliopsoas muscle hematoma 04/2011   Lung nodule 2008   Morbid obesity ([place])    Morbid obesity with BMI of 40.0-44.9, adult [facility])    Obstructive sleep apnea July 2013   Not wearing CPAP. Split-Night sleep study 06/03/11 AHI = 27.43/hr (TOTAL SLEEP TIME 105  minutes) and REM AHI = 120.00/hr   Osteoarthritis of both knees    Bilateral Arthroscopy in 2000 & 2008. Both were complicated w/venous thromboembolism S/P IVC filter placement due to one being massive PE in 2008.   PAF (paroxysmal atrial fibrillation)/atrial flutter 02/28/2010   ECHO : normal LV size, moderate concentricLVH, normal EF, normal atrial sizes, RV was normal but previously dilated;;  Myoview : Normal perfusion study. Low risk scan.   PAH (pulmonary artery hypertension) (HCC)    Prolonged QT interval    Psoriasis    Pulmonary embolism ([place]) 2008   DVT --> Massive PE with right-sided strain. On coumadin. ECHO 02/28/10 showed normal LV size, moderate concentricLVH, normal EF, normal atrial sizes, RV was normal but previously dilated.    Past Surgical History:  Procedure Laterality Date   ABDOMINAL AORTAGRAM N/A 05/14/2011   Procedure: ABDOMINAL AORTAGRAM;  Surgeon: Conrad York, MD;  Location[facility] CATH LAB;  Service: Cardiovascular;  Laterality: N/A;   Abdominal Aortic Evar  08/11/11   Bilateral. Right is 3.2 & the left is 3.6. "Preclose" repair bilateral common femoral artery. Placement of catheter in aorta x 2. Repair of aorta w/modular bifurcated prosthesis w/bilateral limbs.  Placement of right limb extension:  23 mm x 12 cm. Placement of left limb extension: 14 mm x 7 cm.   ACHILLES TENDON SURGERY     COLONOSCOPY  05/11/2011   For GI bleed. Performed by Dr. Juanita Craver, MD. Also had a EGD performed at this time.   COLONOSCOPY  05/12/2011   EMBOLIZATION N/A 07/30/2011   Procedure: EMBOLIZATION;  Surgeon: Conrad Nimrod, MD;  Location: [facility] CATH LAB;  Service: Cardiovascular;  Laterality: N/A;   ESOPHAGOGASTRODUODENOSCOPY  05/08/2011   For GI bleed/Anemia. Also had a colonoscopy at this time.   INSERTION OF VENA CAVA FILTER N/A 05/14/2011   Procedure: INSERTION OF VENA CAVA FILTER;  Surgeon: Conrad [place], MD;  Location: [facility] CATH LAB;  Service: Cardiovascular;  Laterality: N/A;   KNEE  ARTHROSCOPY Bilateral 2000 & 2008   DJD knees. Both were complicated w/venous thromboembolism S/P IVC filter placement due to one being massive PE in 2008.   [place]   LOWER EXTREMITY ANGIOGRAM N/A 05/14/2011   Procedure: LOWER EXTREMITY ANGIOGRAM;  Surgeon: Conrad [place], MD;  Location: [facility] CATH LAB;  Service: Cardiovascular;  Laterality: N/A;    Family History  Problem Relation Age of Onset   Cancer Mother        Pancreatic   Arthritis Mother    Arthritis Father    CAD Father 9   Cancer Brother        Colon   Fainting Neg Hx     Social History   Socioeconomic History   Marital status: Divorced    Spouse name: Not on file   Number of children: 1   Years of education: Hotel manager   Highest education level: Not on file  Occupational History   Occupation: Truck Estate manager/land agent: [employer]  Tobacco Use   Smoking status: Former    Packs/day: 0.50    Years: 2.00    Total pack years: 1.00    Types: Cigarettes    Quit date: 07/05/2006    Years since quitting: 15.5   Smokeless tobacco: Never  Vaping Use   Vaping Use: Never used  Substance and Sexual Activity   Alcohol use: Yes    Alcohol/week: 1.0 - 2.0 standard drink of alcohol    Types: 1 - 2 Shots of liquor per week   Drug use: Yes   Sexual activity: Yes  Other Topics Concern   Not on file  Social History Narrative   He is a divorced father of one, grandfather 2. He tries to exercise, and had been doing it 6 days a week. He had a fall earlier last year inserted got out of the habit. He also working on his diet and sort of got out of that habit as well. He did quit drinking a few years ago and does not smoke.   Social Determinants of Health   Financial Resource Strain: Low Risk  (06/23/2021)   Overall Financial Resource Strain (CARDIA)    Difficulty of Paying Living Expenses: Not hard at all  Food Insecurity: No Food Insecurity (06/23/2021)   Hunger Vital Sign    Worried About Running Out of Food in the  Last Year: Never true    Ran Out of Food in the Last Year: Never true  Transportation Needs: No Transportation Needs (06/23/2021)   PRAPARE - Hydrologist (Medical): No    Lack of Transportation (Non-Medical): No  Physical Activity: Sufficiently Active (06/23/2021)   Exercise Vital Sign  Days of Exercise per Week: 5 days    Minutes of Exercise per Session: 30 min  Stress: No Stress Concern Present (06/23/2021)   [place]    Feeling of Stress : Not at all  Social Connections: Socially Isolated (06/23/2021)   Social Connection and Isolation Panel [NHANES]    Frequency of Communication with Friends and Family: Twice a week    Frequency of Social Gatherings with Friends and Family: Twice a week    Attends Religious Services: Never    Marine scientist or Organizations: No    Attends Archivist Meetings: Never    Marital Status: Divorced  Human resources officer Violence: Not At Risk (06/23/2021)   Humiliation, Afraid, Rape, and Kick questionnaire    Fear of Current or Ex-Partner: No    Emotionally Abused: No    Physically Abused: No    Sexually Abused: No    Outpatient Medications Prior to Visit  Medication Sig Dispense Refill   allopurinol (ZYLOPRIM) 100 MG tablet TAKE 1 TABLET BY MOUTH  DAILY 90 tablet 3   atorvastatin (LIPITOR) 40 MG tablet TAKE 1 TABLET BY MOUTH  DAILY 90 tablet 3   augmented betamethasone dipropionate (DIPROLENE-AF) 0.05 % cream Apply 1 application topically 2 (two) times daily.     digoxin (LANOXIN) 0.125 MG tablet TAKE 1 TABLET BY MOUTH  DAILY 90 tablet 3   diltiazem (TIAZAC) 360 MG 24 hr capsule TAKE 1 CAPSULE BY MOUTH  DAILY 90 capsule 3   hydrochlorothiazide (MICROZIDE) 12.5 MG capsule Take 1 capsule (12.5 mg total) by mouth daily. 90 capsule 1   HYDROcodone-acetaminophen (NORCO) 7.5-325 MG tablet Take 1 tablet by mouth every 6 (six) hours as needed for  moderate pain. 30 tablet 0   traZODone (DESYREL) 50 MG tablet Take 0.5-1 tablets (25-50 mg total) by mouth at bedtime as needed for sleep. 30 tablet 3   VIAGRA 100 MG tablet TAKE 1/2 TO 1 (ONE-HALF TO ONE) TABLET BY MOUTH DAILY AS NEEDED FOR  ERECTILE  DYSFUNCTION 10 tablet 0   warfarin (COUMADIN) 5 MG tablet TAKE 1 TABLET BY MOUTH DAILY  EXCEPT TAKE 1 AND 1/2 TABLETS BY MOUTH ON MONDAYS AND THURSDAYS OR AS DIRECTED BY  ANTICOAGULATION CLINIC 120 tablet 3   dicyclomine (BENTYL) 20 MG tablet Take 1 tablet (20 mg total) by mouth 3 (three) times daily as needed for spasms (abdominal pain/spasm). (Patient not taking: Reported on 01/30/2022) 20 tablet 0   No facility-administered medications prior to visit.    Allergies  Allergen Reactions   Crestor [Rosuvastatin] Swelling    ROS     Objective:    Physical Exam Constitutional:      General: He is not in acute distress.    Appearance: He is not ill-appearing.  Pulmonary:     Effort: Pulmonary effort is normal.  Musculoskeletal:     Left elbow: Normal.     Left forearm: Normal.     Left wrist: Swelling and tenderness present. No deformity. Decreased range of motion. Normal pulse.     Left hand: Decreased strength. Normal sensation. Normal capillary refill. Normal pulse.     Comments: Mild swelling, tenderness and pain at base of left thumb, TTP over thumb CMC joint. ROM is limited due to pain.  LUE is neurovascularly intact. Skin is intact. Palm of left hand normal.   Skin:    General: Skin is warm and dry.     Capillary Refill:  Capillary refill takes less than 2 seconds.  Neurological:     General: No focal deficit present.     Mental Status: He is alert and oriented to person, place, and time.     Sensory: No sensory deficit.     Motor: No weakness.     BP 128/80 (BP Location: Left Arm, Patient Position: Sitting, Cuff Size: Large)   Pulse (!) 56   Temp (!) 97.3 F (36.3 C) (Temporal)   Ht '6\' 1"'$  (1.854 m)   Wt 235 lb (106.6  kg)   SpO2 95%   BMI 31.00 kg/m  Wt Readings from Last 3 Encounters:  01/30/22 235 lb (106.6 kg)  06/03/21 248 lb (112.5 kg)  09/19/20 254 lb (115.2 kg)       Assessment & Plan:   Problem List Items Addressed This Visit   None Visit Diagnoses     Acute pain of left wrist    -  Primary   Relevant Medications   diclofenac Sodium (VOLTAREN) 1 % GEL   Other Relevant Orders   DG Wrist Complete Left (Completed)   Ambulatory referral to Sports Medicine   Swelling of joint of left wrist       Relevant Medications   diclofenac Sodium (VOLTAREN) 1 % GEL   Other Relevant Orders   DG Wrist Complete Left (Completed)   Ambulatory referral to Sports Medicine      LUE is neurovascularly intact. No injury.  Stat left wrist X ray ordered showing mild arthritis of first CMC joint and positive ulnar variance without fracture or dislocation.  Discussed symptomatic management. Voltaren gel prescribed. Tylenol 1,000 mg bid (cannot take NSAIDs due to warfarin). Ice, elevated and he may get an OTC wrist/thumb splint and use if this is helpful.  Referral to Sports Medicine for further evaluation.    I am having Isaiha Asare. Reinig start on diclofenac Sodium. I am also having him maintain his Viagra, dicyclomine, augmented betamethasone dipropionate, traZODone, HYDROcodone-acetaminophen, allopurinol, digoxin, diltiazem, atorvastatin, warfarin, and hydrochlorothiazide.  Meds ordered this encounter  Medications   diclofenac Sodium (VOLTAREN) 1 % GEL    Sig: Apply 2 g topically 4 (four) times daily.    Dispense:  150 g    Refill:  0    Order Specific Question:   Supervising Provider    Answer:   Pricilla Holm A [[id number]]

## 2022-01-30 NOTE — Patient Instructions (Addendum)
Your x-ray is negative for fracture or dislocation. It does show osteoarthritis of the thumb joint.  I recommend taking Tylenol 1000 mg twice daily, using an ice pack, Voltaren gel and elevating your wrist above your heart for pain control.  You can wear an over-the-counter thumb splint if this helps with your pain.  If you are not improving, we can refer you to a hand specialist or orthopedist.

## 2022-01-30 NOTE — Progress Notes (Signed)
Discussed findings in person.  Discussed conservative treatment.  I will also refer him to sports medicine urgently in case he is not improving over the weekend.

## 2022-02-17 ENCOUNTER — Ambulatory Visit: Payer: Medicare Other | Admitting: Internal Medicine

## 2022-02-20 ENCOUNTER — Ambulatory Visit (INDEPENDENT_AMBULATORY_CARE_PROVIDER_SITE_OTHER): Payer: Medicare Other

## 2022-02-20 DIAGNOSIS — Z7901 Long term (current) use of anticoagulants: Secondary | ICD-10-CM | POA: Diagnosis not present

## 2022-02-20 LAB — POCT INR: INR: 2.3 (ref 2.0–3.0)

## 2022-02-20 NOTE — Patient Instructions (Addendum)
Pre visit review using our clinic review tool, if applicable. No additional management support is needed unless otherwise documented below in the visit note.  Continue 1 tablet daily except take 1 1/2 tablets on Mondays and Thursdays. Recheck in 6 weeks.

## 2022-02-20 NOTE — Progress Notes (Signed)
Continue 1 tablet daily except take 1 1/2 tablets on Mondays and Thursdays. Recheck in 6 weeks.

## 2022-04-03 ENCOUNTER — Ambulatory Visit (INDEPENDENT_AMBULATORY_CARE_PROVIDER_SITE_OTHER): Payer: Medicare Other

## 2022-04-03 DIAGNOSIS — Z7901 Long term (current) use of anticoagulants: Secondary | ICD-10-CM | POA: Diagnosis not present

## 2022-04-03 LAB — POCT INR: INR: 2.6 (ref 2.0–3.0)

## 2022-04-03 NOTE — Patient Instructions (Addendum)
Pre visit review using our clinic review tool, if applicable. No additional management support is needed unless otherwise documented below in the visit note.  Continue 1 tablet daily except take 1 1/2 tablets on Mondays and Thursdays. Recheck in 6 weeks.  

## 2022-04-03 NOTE — Progress Notes (Signed)
Continue 1 tablet daily except take 1 1/2 tablets on Mondays and Thursdays. Recheck in 6 weeks.  

## 2022-04-18 ENCOUNTER — Other Ambulatory Visit: Payer: Self-pay | Admitting: Internal Medicine

## 2022-05-15 ENCOUNTER — Ambulatory Visit (INDEPENDENT_AMBULATORY_CARE_PROVIDER_SITE_OTHER): Payer: Medicare Other

## 2022-05-15 DIAGNOSIS — Z7901 Long term (current) use of anticoagulants: Secondary | ICD-10-CM

## 2022-05-15 LAB — POCT INR: INR: 3.5 — AB (ref 2.0–3.0)

## 2022-05-15 NOTE — Patient Instructions (Addendum)
Pre visit review using our clinic review tool, if applicable. No additional management support is needed unless otherwise documented below in the visit note.  Hold dose today and then change weekly dose to take 1 tablet daily except take 1 1/2 tablets Thursdays. Recheck in 3 weeks.

## 2022-05-15 NOTE — Progress Notes (Signed)
Hold dose today and then change weekly dose to take 1 tablet daily except take 1 1/2 tablets Thursdays. Recheck in 3 weeks.

## 2022-05-27 ENCOUNTER — Other Ambulatory Visit: Payer: Self-pay | Admitting: Internal Medicine

## 2022-06-05 ENCOUNTER — Ambulatory Visit (INDEPENDENT_AMBULATORY_CARE_PROVIDER_SITE_OTHER): Payer: Medicare Other

## 2022-06-05 DIAGNOSIS — Z7901 Long term (current) use of anticoagulants: Secondary | ICD-10-CM

## 2022-06-05 LAB — POCT INR: INR: 3.3 — AB (ref 2.0–3.0)

## 2022-06-05 NOTE — Patient Instructions (Addendum)
Pre visit review using our clinic review tool, if applicable. No additional management support is needed unless otherwise documented below in the visit note.  Hold dose today and then change weekly dose to take 1 tablet daily. Recheck in 4 weeks.

## 2022-06-05 NOTE — Progress Notes (Addendum)
Hold dose today and then change weekly dose to take 1 tablet daily. Recheck in 4 weeks per pt request.

## 2022-06-25 ENCOUNTER — Ambulatory Visit (INDEPENDENT_AMBULATORY_CARE_PROVIDER_SITE_OTHER): Payer: Medicare Other

## 2022-06-25 VITALS — Ht 73.0 in | Wt 239.0 lb

## 2022-06-25 DIAGNOSIS — Z0000 Encounter for general adult medical examination without abnormal findings: Secondary | ICD-10-CM

## 2022-06-25 NOTE — Progress Notes (Signed)
I connected with  [name] on 06/25/22 by a audio enabled telemedicine application and verified that I am speaking with the correct person using two identifiers.  Patient Location: Home  Provider Location: Office/Clinic  I discussed the limitations of evaluation and management by telemedicine. The patient expressed understanding and agreed to proceed.  Subjective:   [name] is a 72 y.o. male who presents for Medicare Annual/Subsequent preventive examination.  Review of Systems     Cardiac Risk Factors include: advanced age (>46men, >23 women);dyslipidemia;family history of premature cardiovascular disease;hypertension;male gender;obesity (BMI >30kg/m2)     Objective:    Today's Vitals   06/25/22 1103  Weight: 239 lb (108.4 kg)  Height: 6\' 1"  (1.854 m)  PainSc: 0-No pain   Body mass index is 31.53 kg/m.     06/25/2022   11:04 AM 06/23/2021   10:56 AM 08/08/2020    1:47 PM 10/12/2019    9:49 AM 08/14/2016   12:15 PM 11/01/2015    8:45 AM 10/26/2014    8:55 AM  Advanced Directives  Does Patient Have a Medical Advance Directive? No Yes No Yes No No No  Type of Special educational needs teacher of [place];Living will  Healthcare Power of [place];Living will     Does patient want to make changes to medical advance directive?    No - Patient declined     Copy of Healthcare Power of Attorney in Chart?  No - copy requested  No - copy requested     Would patient like information on creating a medical advance directive? No - Patient declined  No - Patient declined  No - Patient declined No - patient declined information No - patient declined information    Current Medications (verified) Outpatient Encounter Medications as of 06/25/2022  Medication Sig   allopurinol (ZYLOPRIM) 100 MG tablet TAKE 1 TABLET BY MOUTH  DAILY   atorvastatin (LIPITOR) 40 MG tablet TAKE 1 TABLET BY MOUTH  DAILY   augmented betamethasone dipropionate (DIPROLENE-AF) 0.05 % cream Apply 1  application topically 2 (two) times daily.   diclofenac Sodium (VOLTAREN) 1 % GEL Apply 2 g topically 4 (four) times daily.   dicyclomine (BENTYL) 20 MG tablet Take 1 tablet (20 mg total) by mouth 3 (three) times daily as needed for spasms (abdominal pain/spasm). (Patient not taking: Reported on 01/30/2022)   digoxin (LANOXIN) 0.125 MG tablet TAKE 1 TABLET BY MOUTH  DAILY   diltiazem (TIAZAC) 360 MG 24 hr capsule TAKE 1 CAPSULE BY MOUTH  DAILY   hydrochlorothiazide (MICROZIDE) 12.5 MG capsule TAKE 1 CAPSULE BY MOUTH DAILY   HYDROcodone-acetaminophen (NORCO) 7.5-325 MG tablet Take 1 tablet by mouth every 6 (six) hours as needed for moderate pain.   traZODone (DESYREL) 50 MG tablet Take 0.5-1 tablets (25-50 mg total) by mouth at bedtime as needed for sleep.   VIAGRA 100 MG tablet TAKE 1/2 TO 1 (ONE-HALF TO ONE) TABLET BY MOUTH DAILY AS NEEDED FOR  ERECTILE  DYSFUNCTION   warfarin (COUMADIN) 5 MG tablet TAKE 1 TABLET BY MOUTH DAILY  EXCEPT TAKE 1 AND 1/2 TABLETS BY MOUTH ON MONDAYS AND THURSDAYS OR AS DIRECTED BY  ANTICOAGULATION CLINIC   No facility-administered encounter medications on file as of 06/25/2022.    Allergies (verified) Crestor [rosuvastatin]   History: Past Medical History:  Diagnosis Date   AAA (abdominal aortic aneurysm) [facility]) July 2013   Abdominal Aortic Endovascular Stent Graft 08/11/11. Bilateral. Right is 3.2 & the left is 3.6. "Preclose"  repair bilateral common femoral artery. Placement of catheter in aorta x 2. Repair of aorta w/modular bifurcated prosthesis w/bilateral limbs.  Placement of right limb extension: 23 mm x 12 cm. Placement of left limb extension: 14 mm x 7 cm.   Anemia    From GI bleed in 05/11/11.    Chronic anticoagulation, PE DVT PAF    Chronic pain    Depression    Dyslipidemia    Edema of left lower extremity    S/P I&D 08/30/06   GI bleed 04/2011   With anemia. EGD & colonoscopy 05/11/2011   History of alcohol dependence (HCC)    HTN (hypertension)     Hyperlipemia    Hypertension    Iliac artery aneurysm, bilateral (HCC)    PVD, bilat common iliac aneurysms [443.9]   Iliopsoas muscle hematoma 04/2011   Lung nodule 2008   Morbid obesity (HCC)    Morbid obesity with BMI of 40.0-44.9, adult [facility])    Obstructive sleep apnea July 2013   Not wearing CPAP. Split-Night sleep study 06/03/11 AHI = 27.43/hr (TOTAL SLEEP TIME 105 minutes) and REM AHI = 120.00/hr   Osteoarthritis of both knees    Bilateral Arthroscopy in 2000 & 2008. Both were complicated w/venous thromboembolism S/P IVC filter placement due to one being massive PE in 2008.   PAF (paroxysmal atrial fibrillation)/atrial flutter 02/28/2010   ECHO : normal LV size, moderate concentricLVH, normal EF, normal atrial sizes, RV was normal but previously dilated;;  Myoview : Normal perfusion study. Low risk scan.   PAH (pulmonary artery hypertension) (HCC)    Prolonged QT interval    Psoriasis    Pulmonary embolism (HCC) 2008   DVT --> Massive PE with right-sided strain. On coumadin. ECHO 02/28/10 showed normal LV size, moderate concentricLVH, normal EF, normal atrial sizes, RV was normal but previously dilated.   Past Surgical History:  Procedure Laterality Date   ABDOMINAL AORTAGRAM N/A 05/14/2011   Procedure: ABDOMINAL AORTAGRAM;  Surgeon: Fransisco Hertz, MD;  Location[facility] CATH LAB;  Service: Cardiovascular;  Laterality: N/A;   Abdominal Aortic Evar  08/11/11   Bilateral. Right is 3.2 & the left is 3.6. "Preclose" repair bilateral common femoral artery. Placement of catheter in aorta x 2. Repair of aorta w/modular bifurcated prosthesis w/bilateral limbs.  Placement of right limb extension: 23 mm x 12 cm. Placement of left limb extension: 14 mm x 7 cm.   ACHILLES TENDON SURGERY     COLONOSCOPY  05/11/2011   For GI bleed. Performed by Dr. Charna Elizabeth, MD. Also had a EGD performed at this time.   COLONOSCOPY  05/12/2011   EMBOLIZATION N/A 07/30/2011   Procedure: EMBOLIZATION;  Surgeon: Fransisco Hertz, MD;  Location: [facility] CATH LAB;  Service: Cardiovascular;  Laterality: N/A;   ESOPHAGOGASTRODUODENOSCOPY  05/08/2011   For GI bleed/Anemia. Also had a colonoscopy at this time.   INSERTION OF VENA CAVA FILTER N/A 05/14/2011   Procedure: INSERTION OF VENA CAVA FILTER;  Surgeon: Fransisco Hertz, MD;  Location: [facility] CATH LAB;  Service: Cardiovascular;  Laterality: N/A;   KNEE ARTHROSCOPY Bilateral 2000 & 2008   DJD knees. Both were complicated w/venous thromboembolism S/P IVC filter placement due to one being massive PE in 2008.   KNEE SURGERY  1999   LOWER EXTREMITY ANGIOGRAM N/A 05/14/2011   Procedure: LOWER EXTREMITY ANGIOGRAM;  Surgeon: Fransisco Hertz, MD;  Location: [facility] CATH LAB;  Service: Cardiovascular;  Laterality: N/A;   Family History  Problem  Relation Age of Onset   Cancer Mother        Pancreatic   Arthritis Mother    Arthritis Father    CAD Father 51   Cancer Brother        Colon   Fainting Neg Hx    Social History   Socioeconomic History   Marital status: Divorced    Spouse name: Not on file   Number of children: 1   Years of education: Scientist, product/process development   Highest education level: Not on file  Occupational History   Occupation: Truck Tour manager: [employer]  Tobacco Use   Smoking status: Former    Packs/day: 0.50    Years: 2.00    Additional pack years: 0.00    Total pack years: 1.00    Types: Cigarettes    Quit date: 07/05/2006    Years since quitting: 15.9   Smokeless tobacco: Never  Vaping Use   Vaping Use: Never used  Substance and Sexual Activity   Alcohol use: Yes    Alcohol/week: 1.0 - 2.0 standard drink of alcohol    Types: 1 - 2 Shots of liquor per week   Drug use: Yes   Sexual activity: Yes  Other Topics Concern   Not on file  Social History Narrative   He is a divorced father of one, grandfather 2. He tries to exercise, and had been doing it 6 days a week. He had a fall earlier last year inserted got out of the habit. He also working on his diet and  sort of got out of that habit as well. He did quit drinking a few years ago and does not smoke.   Social Determinants of Health   Financial Resource Strain: Low Risk  (06/25/2022)   Overall Financial Resource Strain (CARDIA)    Difficulty of Paying Living Expenses: Not hard at all  Food Insecurity: No Food Insecurity (06/25/2022)   Hunger Vital Sign    Worried About Running Out of Food in the Last Year: Never true    Ran Out of Food in the Last Year: Never true  Transportation Needs: No Transportation Needs (06/25/2022)   PRAPARE - Administrator, Civil Service (Medical): No    Lack of Transportation (Non-Medical): No  Physical Activity: Sufficiently Active (06/25/2022)   Exercise Vital Sign    Days of Exercise per Week: 4 days    Minutes of Exercise per Session: 60 min  Stress: No Stress Concern Present (06/25/2022)   [employer] of Occupational Health - Occupational Stress Questionnaire    Feeling of Stress : Not at all  Social Connections: Socially Isolated (06/25/2022)   Social Connection and Isolation Panel [NHANES]    Frequency of Communication with Friends and Family: Twice a week    Frequency of Social Gatherings with Friends and Family: Twice a week    Attends Religious Services: Never    Database administrator or Organizations: No    Attends Engineer, structural: Never    Marital Status: Divorced    Tobacco Counseling Counseling given: Not Answered   Clinical Intake:  Pre-visit preparation completed: Yes  Pain : No/denies pain Pain Score: 0-No pain     BMI - recorded: 31.53 Nutritional Status: BMI > 30  Obese Nutritional Risks: None Diabetes: No  How often do you need to have someone help you when you read instructions, pamphlets, or other written materials from your doctor or pharmacy?: 1 - Never What  is the last grade level you completed in school?: HSG  Diabetic? No  Interpreter Needed?: No  Information entered by :: Waniya Hoglund N.  Virgle Arth, LPN.   Activities of Daily Living    06/25/2022   11:07 AM  In your present state of health, do you have any difficulty performing the following activities:  Hearing? 0  Vision? 0  Difficulty concentrating or making decisions? 0  Walking or climbing stairs? 0  Dressing or bathing? 0  Doing errands, shopping? 0  Preparing Food and eating ? N  Using the Toilet? N  In the past six months, have you accidently leaked urine? N  Do you have problems with loss of bowel control? N  Managing your Medications? N  Managing your Finances? N  Housekeeping or managing your Housekeeping? N    Patient Care Team: Corwin Levins, MD as PCP - General (Internal Medicine) Dione Booze, Bertram Millard, MD as Consulting Physician (Ophthalmology)  Indicate any recent Medical Services you may have received from other than [facility] providers in the past year (date may be approximate).     Assessment:   This is a routine wellness examination for [place].  Hearing/Vision screen Hearing Screening - Comments:: Denies hearing difficulties   Vision Screening - Comments:: Wears rx glasses - up to date with routine eye exams with    Dietary issues and exercise activities discussed: Current Exercise Habits: Structured exercise class, Type of exercise: walking;treadmill;stretching;strength training/weights, Time (Minutes): 60, Frequency (Times/Week): 4, Weekly Exercise (Minutes/Week): 240, Intensity: Moderate   Goals Addressed             This Visit's Progress    My goal for 2024 is to lose weight.  I would like to lose 20 pounds.        Depression Screen    06/25/2022   11:11 AM 06/23/2021   10:58 AM 06/03/2021    3:45 PM 09/19/2020   10:07 AM 10/12/2019    9:50 AM 08/22/2018    9:16 AM 07/09/2017    8:49 AM  PHQ 2/9 Scores  PHQ - 2 Score 0 0 0 1 0 1 1  PHQ- 9 Score 0          Fall Risk    06/25/2022   11:05 AM 06/23/2021   10:57 AM 06/03/2021    3:45 PM 09/19/2020   10:07 AM 10/12/2019   10:03 AM   Fall Risk   Falls in the past year? 0 0 0 0 1  Number falls in past yr: 0 0 0 0 0  Injury with Fall? 0 0 0 0 0  Risk for fall due to : No Fall Risks    Other (Comment)  Risk for fall due to: Comment     accidently tripped; no injuries; no ER visit  Follow up Falls prevention discussed Falls evaluation completed   Falls evaluation completed    FALL RISK PREVENTION PERTAINING TO THE HOME:  Any stairs in or around the home? No  If so, are there any without handrails? No  Home free of loose throw rugs in walkways, pet beds, electrical cords, etc? Yes  Adequate lighting in your home to reduce risk of falls? Yes   ASSISTIVE DEVICES UTILIZED TO PREVENT FALLS:  Life alert? No  Use of a cane, walker or w/c? No  Grab bars in the bathroom? No  Shower chair or bench in shower?  No Elevated toilet seat or a handicapped toilet? No   TIMED UP AND GO:  Was the test performed? No . Telephonic Visit  Cognitive Function:        06/25/2022   11:12 AM  6CIT Screen  What Year? 0 points  What month? 0 points  What time? 0 points  Count back from 20 0 points  Months in reverse 0 points  Repeat phrase 0 points  Total Score 0 points    Immunizations Immunization History  Administered Date(s) Administered   Fluad Quad(high Dose 65+) 09/02/2018, 09/29/2019, 09/19/2020, 09/19/2021   Influenza Split 11/03/2011   Influenza, High Dose Seasonal PF 11/01/2014, 10/02/2015, 09/09/2016, 10/01/2017   Influenza,inj,Quad PF,6+ Mos 10/18/2012, 09/26/2013   PFIZER(Purple Top)SARS-COV-2 Vaccination 02/03/2019, 02/24/2019, 04/09/2020   Pneumococcal Conjugate-13 02/05/2016   Pneumococcal Polysaccharide-23 07/09/2017   Tdap 09/08/2011    TDAP status: Due, Education has been provided regarding the importance of this vaccine. Advised may receive this vaccine at local pharmacy or Health Dept. Aware to provide a copy of the vaccination record if obtained from local pharmacy or Health Dept. Verbalized  acceptance and understanding.  Flu Vaccine status: Up to date  Pneumococcal vaccine status: Up to date  Covid-19 vaccine status: Completed vaccines  Qualifies for Shingles Vaccine? Yes   Zostavax completed No   Shingrix Completed?: No.    Education has been provided regarding the importance of this vaccine. Patient has been advised to call insurance company to determine out of pocket expense if they have not yet received this vaccine. Advised may also receive vaccine at local pharmacy or Health Dept. Verbalized acceptance and understanding.  Screening Tests Health Maintenance  Topic Date Due   Zoster Vaccines- Shingrix (1 of 2) Never done   Colonoscopy  05/11/2021   DTaP/Tdap/Td (2 - Td or Tdap) 09/07/2021   COVID-19 Vaccine (4 - 2023-24 season) 09/12/2021   INFLUENZA VACCINE  08/13/2022   Medicare Annual Wellness (AWV)  06/25/2023   Pneumonia Vaccine 70+ Years old  Completed   Hepatitis C Screening  Completed   HPV VACCINES  Aged Out    Health Maintenance  Health Maintenance Due  Topic Date Due   Zoster Vaccines- Shingrix (1 of 2) Never done   Colonoscopy  05/11/2021   DTaP/Tdap/Td (2 - Td or Tdap) 09/07/2021   COVID-19 Vaccine (4 - 2023-24 season) 09/12/2021    Colorectal cancer screening: Type of screening: Colonoscopy. Completed 09/08/2011. Repeat every 10 years  Lung Cancer Screening: (Low Dose CT Chest recommended if Age 62-80 years, 30 pack-year currently smoking OR have quit w/in 15years.) does not qualify.   Lung Cancer Screening Referral: no  Additional Screening:  Hepatitis C Screening: does qualify; Completed 04/24/2015  Vision Screening: Recommended annual ophthalmology exams for early detection of glaucoma and other disorders of the eye. Is the patient up to date with their annual eye exam?  Yes  Who is the provider or what is the name of the office in which the patient attends annual eye exams? R. Fabian Sharp, MD. If pt is not established with a provider,  would they like to be referred to a provider to establish care? No .   Dental Screening: Recommended annual dental exams for proper oral hygiene  Community Resource Referral / Chronic Care Management: CRR required this visit?  No   CCM required this visit?  No      Plan:     I have personally reviewed and noted the following in the patient's chart:   Medical and social history Use of alcohol, tobacco or illicit drugs  Current medications and  supplements including opioid prescriptions. Patient is currently taking opioid prescriptions. Information provided to patient regarding non-opioid alternatives. Patient advised to discuss non-opioid treatment plan with their provider. Functional ability and status Nutritional status Physical activity Advanced directives List of other physicians Hospitalizations, surgeries, and ER visits in previous 12 months Vitals Screenings to include cognitive, depression, and falls Referrals and appointments  In addition, I have reviewed and discussed with patient certain preventive protocols, quality metrics, and best practice recommendations. A written personalized care plan for preventive services as well as general preventive health recommendations were provided to patient.     Mickeal Needy, LPN   [id number]/[id number]   Nurse Notes: Normal cognitive status assessed by direct observation via telephone conversation by this Nurse Health Advisor. No abnormalities found.

## 2022-06-25 NOTE — Patient Instructions (Addendum)
Mr. [name] , Thank you for taking time to come for your Medicare Wellness Visit. I appreciate your ongoing commitment to your health goals. Please review the following plan we discussed and let me know if I can assist you in the future.   These are the goals we discussed:  Goals      My goal for 2024 is to lose weight.  I would like to lose 20 pounds.        This is a list of the screening recommended for you and due dates:  Health Maintenance  Topic Date Due   Zoster (Shingles) Vaccine (1 of 2) Never done   Colon Cancer Screening  05/11/2021   DTaP/Tdap/Td vaccine (2 - Td or Tdap) 09/07/2021   COVID-19 Vaccine (4 - 2023-24 season) 09/12/2021   Flu Shot  08/13/2022   Medicare Annual Wellness Visit  06/25/2023   Pneumonia Vaccine  Completed   Hepatitis C Screening  Completed   HPV Vaccine  Aged Out    Advanced directives: No  Conditions/risks identified: Yes  Next appointment: Follow up in one year for your annual wellness visit via telephone call with Nurse Percell Miller on 06/30/2023 at 2:00 p.m.  If you need to cancel or reschedule please call [phone].  Preventive Care 75 Years and Older, Male  Preventive care refers to lifestyle choices and visits with your health care provider that can promote health and wellness. What does preventive care include? A yearly physical exam. This is also called an annual well check. Dental exams once or twice a year. Routine eye exams. Ask your health care provider how often you should have your eyes checked. Personal lifestyle choices, including: Daily care of your teeth and gums. Regular physical activity. Eating a healthy diet. Avoiding tobacco and drug use. Limiting alcohol use. Practicing safe sex. Taking low doses of aspirin every day. Taking vitamin and mineral supplements as recommended by your health care provider. What happens during an annual well check? The services and screenings done by your health care provider during  your annual well check will depend on your age, overall health, lifestyle risk factors, and family history of disease. Counseling  Your health care provider may ask you questions about your: Alcohol use. Tobacco use. Drug use. Emotional well-being. Home and relationship well-being. Sexual activity. Eating habits. History of falls. Memory and ability to understand (cognition). Work and work Astronomer. Screening  You may have the following tests or measurements: Height, weight, and BMI. Blood pressure. Lipid and cholesterol levels. These may be checked every 5 years, or more frequently if you are over 79 years old. Skin check. Lung cancer screening. You may have this screening every year starting at age 74 if you have a 30-pack-year history of smoking and currently smoke or have quit within the past 15 years. Fecal occult blood test (FOBT) of the stool. You may have this test every year starting at age 75. Flexible sigmoidoscopy or colonoscopy. You may have a sigmoidoscopy every 5 years or a colonoscopy every 10 years starting at age 102. Prostate cancer screening. Recommendations will vary depending on your family history and other risks. Hepatitis C blood test. Hepatitis B blood test. Sexually transmitted disease (STD) testing. Diabetes screening. This is done by checking your blood sugar (glucose) after you have not eaten for a while (fasting). You may have this done every 1-3 years. Abdominal aortic aneurysm (AAA) screening. You may need this if you are a current or former smoker. Osteoporosis. You may  be screened starting at age 23 if you are at high risk. Talk with your health care provider about your test results, treatment options, and if necessary, the need for more tests. Vaccines  Your health care provider may recommend certain vaccines, such as: Influenza vaccine. This is recommended every year. Tetanus, diphtheria, and acellular pertussis (Tdap, Td) vaccine. You may need  a Td booster every 10 years. Zoster vaccine. You may need this after age 23. Pneumococcal 13-valent conjugate (PCV13) vaccine. One dose is recommended after age 77. Pneumococcal polysaccharide (PPSV23) vaccine. One dose is recommended after age 44. Talk to your health care provider about which screenings and vaccines you need and how often you need them. This information is not intended to replace advice given to you by your health care provider. Make sure you discuss any questions you have with your health care provider. Document Released: 01/25/2015 Document Revised: 09/18/2015 Document Reviewed: 10/30/2014 Elsevier Interactive Patient Education  2017 [employer].  Fall Prevention in the Home Falls can cause injuries. They can happen to people of all ages. There are many things you can do to make your home safe and to help prevent falls. What can I do on the outside of my home? Regularly fix the edges of walkways and driveways and fix any cracks. Remove anything that might make you trip as you walk through a door, such as a raised step or threshold. Trim any bushes or trees on the path to your home. Use bright outdoor lighting. Clear any walking paths of anything that might make someone trip, such as rocks or tools. Regularly check to see if handrails are loose or broken. Make sure that both sides of any steps have handrails. Any raised decks and porches should have guardrails on the edges. Have any leaves, snow, or ice cleared regularly. Use sand or salt on walking paths during winter. Clean up any spills in your garage right away. This includes oil or grease spills. What can I do in the bathroom? Use night lights. Install grab bars by the toilet and in the tub and shower. Do not use towel bars as grab bars. Use non-skid mats or decals in the tub or shower. If you need to sit down in the shower, use a plastic, non-slip stool. Keep the floor dry. Clean up any water that spills on the  floor as soon as it happens. Remove soap buildup in the tub or shower regularly. Attach bath mats securely with double-sided non-slip rug tape. Do not have throw rugs and other things on the floor that can make you trip. What can I do in the bedroom? Use night lights. Make sure that you have a light by your bed that is easy to reach. Do not use any sheets or blankets that are too big for your bed. They should not hang down onto the floor. Have a firm chair that has side arms. You can use this for support while you get dressed. Do not have throw rugs and other things on the floor that can make you trip. What can I do in the kitchen? Clean up any spills right away. Avoid walking on wet floors. Keep items that you use a lot in easy-to-reach places. If you need to reach something above you, use a strong step stool that has a grab bar. Keep electrical cords out of the way. Do not use floor polish or wax that makes floors slippery. If you must use wax, use non-skid floor wax. Do not  have throw rugs and other things on the floor that can make you trip. What can I do with my stairs? Do not leave any items on the stairs. Make sure that there are handrails on both sides of the stairs and use them. Fix handrails that are broken or loose. Make sure that handrails are as long as the stairways. Check any carpeting to make sure that it is firmly attached to the stairs. Fix any carpet that is loose or worn. Avoid having throw rugs at the top or bottom of the stairs. If you do have throw rugs, attach them to the floor with carpet tape. Make sure that you have a light switch at the top of the stairs and the bottom of the stairs. If you do not have them, ask someone to add them for you. What else can I do to help prevent falls? Wear shoes that: Do not have high heels. Have rubber bottoms. Are comfortable and fit you well. Are closed at the toe. Do not wear sandals. If you use a stepladder: Make sure that  it is fully opened. Do not climb a closed stepladder. Make sure that both sides of the stepladder are locked into place. Ask someone to hold it for you, if possible. Clearly mark and make sure that you can see: Any grab bars or handrails. First and last steps. Where the edge of each step is. Use tools that help you move around (mobility aids) if they are needed. These include: Canes. Walkers. Scooters. Crutches. Turn on the lights when you go into a dark area. Replace any light bulbs as soon as they burn out. Set up your furniture so you have a clear path. Avoid moving your furniture around. If any of your floors are uneven, fix them. If there are any pets around you, be aware of where they are. Review your medicines with your doctor. Some medicines can make you feel dizzy. This can increase your chance of falling. Ask your doctor what other things that you can do to help prevent falls. This information is not intended to replace advice given to you by your health care provider. Make sure you discuss any questions you have with your health care provider. Document Released: 10/25/2008 Document Revised: 06/06/2015 Document Reviewed: 02/02/2014 Elsevier Interactive Patient Education  2017 [employer].

## 2022-07-03 ENCOUNTER — Ambulatory Visit (INDEPENDENT_AMBULATORY_CARE_PROVIDER_SITE_OTHER): Payer: Medicare Other

## 2022-07-03 DIAGNOSIS — Z7901 Long term (current) use of anticoagulants: Secondary | ICD-10-CM

## 2022-07-03 LAB — POCT INR: INR: 3.1 — AB (ref 2.0–3.0)

## 2022-07-03 NOTE — Progress Notes (Addendum)
Pt reports he took an extra 1/2 tablet last week by accident. Also reports increase in alcohol intake.  Due to these factors, will not change weekly dosing. Will recheck in 3 weeks. If INR is still elevated, will make change in weekly dose.  Reduce dose today to take 1/2 tablet and the continue 1 tablet daily. Recheck in 3 weeks.

## 2022-07-03 NOTE — Patient Instructions (Addendum)
Pre visit review using our clinic review tool, if applicable. No additional management support is needed unless otherwise documented below in the visit note.  Reduce dose today to take 1/2 tablet and then continue 1 tablet daily. Recheck in 3 weeks.

## 2022-07-24 ENCOUNTER — Ambulatory Visit: Payer: Medicare Other

## 2022-07-31 ENCOUNTER — Ambulatory Visit (INDEPENDENT_AMBULATORY_CARE_PROVIDER_SITE_OTHER): Payer: Medicare Other

## 2022-07-31 ENCOUNTER — Other Ambulatory Visit: Payer: Self-pay | Admitting: Internal Medicine

## 2022-07-31 DIAGNOSIS — Z7901 Long term (current) use of anticoagulants: Secondary | ICD-10-CM

## 2022-07-31 LAB — POCT INR: INR: 2.4 (ref 2.0–3.0)

## 2022-07-31 NOTE — Patient Instructions (Addendum)
Pre visit review using our clinic review tool, if applicable. No additional management support is needed unless otherwise documented below in the visit note.  Continue 1 tablet daily. Recheck in 6 weeks 

## 2022-07-31 NOTE — Progress Notes (Signed)
Continue 1 tablet daily. Recheck in 6 weeks.

## 2022-08-03 NOTE — Telephone Encounter (Signed)
Pt is compliant with warfarin management and PCP apts.  Sent in refill of warfarin to requested pharmacy.      

## 2022-09-11 ENCOUNTER — Ambulatory Visit (INDEPENDENT_AMBULATORY_CARE_PROVIDER_SITE_OTHER): Payer: Medicare Other

## 2022-09-11 DIAGNOSIS — Z7901 Long term (current) use of anticoagulants: Secondary | ICD-10-CM

## 2022-09-11 LAB — POCT INR: INR: 2 (ref 2.0–3.0)

## 2022-09-11 NOTE — Patient Instructions (Addendum)
Pre visit review using our clinic review tool, if applicable. No additional management support is needed unless otherwise documented below in the visit note.  Continue 1 tablet daily. Recheck in 6 weeks 

## 2022-09-11 NOTE — Progress Notes (Signed)
Continue 1 tablet daily. Recheck in 6 weeks.

## 2022-10-23 ENCOUNTER — Ambulatory Visit: Payer: Medicare Other

## 2022-10-30 ENCOUNTER — Ambulatory Visit: Payer: Medicare Other

## 2022-10-30 DIAGNOSIS — Z23 Encounter for immunization: Secondary | ICD-10-CM | POA: Diagnosis not present

## 2022-10-30 DIAGNOSIS — Z7901 Long term (current) use of anticoagulants: Secondary | ICD-10-CM | POA: Diagnosis not present

## 2022-10-30 LAB — POCT INR: INR: 2.1 (ref 2.0–3.0)

## 2022-10-30 NOTE — Patient Instructions (Addendum)
Pre visit review using our clinic review tool, if applicable. No additional management support is needed unless otherwise documented below in the visit note.  Continue 1 tablet daily. Recheck in 6 weeks 

## 2022-10-30 NOTE — Progress Notes (Signed)
Continue 1 tablet daily. Recheck in 6 weeks.    Pt requested high dose flu vaccine. Administered flu vaccine. Pt tolerated well.

## 2022-12-08 ENCOUNTER — Ambulatory Visit (INDEPENDENT_AMBULATORY_CARE_PROVIDER_SITE_OTHER): Payer: Medicare Other

## 2022-12-08 DIAGNOSIS — Z7901 Long term (current) use of anticoagulants: Secondary | ICD-10-CM | POA: Diagnosis not present

## 2022-12-08 LAB — POCT INR: INR: 2 (ref 2.0–3.0)

## 2022-12-08 NOTE — Patient Instructions (Addendum)
Pre visit review using our clinic review tool, if applicable. No additional management support is needed unless otherwise documented below in the visit note.  Continue 1 tablet daily.  Re-check in 6 weeks.

## 2022-12-08 NOTE — Progress Notes (Signed)
Continue 1 tablet daily. Recheck in 6 weeks.

## 2022-12-25 ENCOUNTER — Other Ambulatory Visit: Payer: Self-pay | Admitting: Internal Medicine

## 2022-12-25 ENCOUNTER — Other Ambulatory Visit: Payer: Self-pay

## 2023-01-19 ENCOUNTER — Ambulatory Visit: Payer: Medicare Other

## 2023-01-26 ENCOUNTER — Ambulatory Visit: Payer: Medicare Other

## 2023-02-01 ENCOUNTER — Telehealth: Payer: Self-pay | Admitting: Internal Medicine

## 2023-02-01 NOTE — Telephone Encounter (Signed)
Copied from CRM [id number]. Topic: Appointments - Appointment Cancel/Reschedule >> Jan 29, 2023  4:22 PM Isabell A wrote: Patient/patient representative is calling to cancel or reschedule an appointment. Refer to attachments for appointment information.   Patient calling to reschedule his Coumadin Clinic appointment on 1/21.

## 2023-02-01 NOTE — Telephone Encounter (Signed)
Pt reports he is concerned there will be bad weather tomorrow and he cannot make it to the apt if the weather/roads are bad. RS for 1/24. Pt verbalized understanding.

## 2023-02-02 ENCOUNTER — Ambulatory Visit: Payer: Medicare Other

## 2023-02-05 ENCOUNTER — Ambulatory Visit (INDEPENDENT_AMBULATORY_CARE_PROVIDER_SITE_OTHER): Payer: Medicare Other

## 2023-02-05 DIAGNOSIS — Z7901 Long term (current) use of anticoagulants: Secondary | ICD-10-CM | POA: Diagnosis not present

## 2023-02-05 LAB — POCT INR: INR: 2.3 (ref 2.0–3.0)

## 2023-02-05 NOTE — Progress Notes (Signed)
Continue 1 tablet daily. Recheck in 6 weeks.

## 2023-02-05 NOTE — Patient Instructions (Addendum)
Pre visit review using our clinic review tool, if applicable. No additional management support is needed unless otherwise documented below in the visit note.  Continue 1 tablet daily.  Re-check in 6 weeks.

## 2023-02-28 ENCOUNTER — Other Ambulatory Visit: Payer: Self-pay | Admitting: Internal Medicine

## 2023-03-01 ENCOUNTER — Other Ambulatory Visit: Payer: Self-pay

## 2023-03-19 ENCOUNTER — Ambulatory Visit: Payer: Medicare Other

## 2023-03-19 DIAGNOSIS — Z7901 Long term (current) use of anticoagulants: Secondary | ICD-10-CM

## 2023-03-19 LAB — POCT INR: INR: 2.4 (ref 2.0–3.0)

## 2023-03-19 NOTE — Patient Instructions (Addendum)
 Pre visit review using our clinic review tool, if applicable. No additional management support is needed unless otherwise documented below in the visit note.  Continue 1 tablet daily.  Re-check in 6 weeks.

## 2023-03-19 NOTE — Progress Notes (Signed)
 Continue 1 tablet daily. Recheck in 6 weeks.

## 2023-04-30 ENCOUNTER — Ambulatory Visit

## 2023-05-06 ENCOUNTER — Telehealth: Payer: Self-pay | Admitting: Internal Medicine

## 2023-05-06 NOTE — Telephone Encounter (Signed)
 Copied from CRM [id number]. Topic: Appointments - Appointment Cancel/Reschedule >> May 06, 2023  1:31 PM Ovid Blow wrote: Patient/patient representative is calling to cancel or reschedule an appointment.Patient wants to reschedule Coumadin  appt. On 4/25. Please contact patient regarding this matter

## 2023-05-06 NOTE — Telephone Encounter (Signed)
LVM for pt to return call to RS.

## 2023-05-07 ENCOUNTER — Ambulatory Visit

## 2023-05-07 NOTE — Telephone Encounter (Signed)
 LVM

## 2023-05-10 NOTE — Telephone Encounter (Signed)
 LVM

## 2023-05-18 ENCOUNTER — Telehealth: Payer: Self-pay | Admitting: Internal Medicine

## 2023-05-18 NOTE — Telephone Encounter (Signed)
 Changed apt time per pt request

## 2023-05-18 NOTE — Telephone Encounter (Signed)
 Copied from CRM [id number]. Topic: General - Call Back - No Documentation >> May 18, 2023 12:47 PM Marlan Silva wrote: Reason for CRM: Patient requesting a call back from a Coumadin  Clinical nurse. Patient states he can be reached at the number on file.

## 2023-05-21 ENCOUNTER — Ambulatory Visit (INDEPENDENT_AMBULATORY_CARE_PROVIDER_SITE_OTHER)

## 2023-05-21 DIAGNOSIS — Z7901 Long term (current) use of anticoagulants: Secondary | ICD-10-CM

## 2023-05-21 LAB — POCT INR: INR: 1.8 — AB (ref 2.0–3.0)

## 2023-05-21 NOTE — Patient Instructions (Addendum)
 Pre visit review using our clinic review tool, if applicable. No additional management support is needed unless otherwise documented below in the visit note.  Increase dose today to take 1 1/2 tablets and then continue 1 tablet daily. Recheck in 4 weeks.

## 2023-05-21 NOTE — Progress Notes (Signed)
 Pt ate a lot of salad this week. Increase dose today to take 1 1/2 tablets and then continue 1 tablet daily. Recheck in 4 weeks.

## 2023-06-18 ENCOUNTER — Ambulatory Visit

## 2023-06-25 ENCOUNTER — Ambulatory Visit (INDEPENDENT_AMBULATORY_CARE_PROVIDER_SITE_OTHER)

## 2023-06-25 DIAGNOSIS — Z7901 Long term (current) use of anticoagulants: Secondary | ICD-10-CM | POA: Diagnosis not present

## 2023-06-25 LAB — POCT INR: INR: 2.1 (ref 2.0–3.0)

## 2023-06-25 NOTE — Patient Instructions (Addendum)
 Pre visit review using our clinic review tool, if applicable. No additional management support is needed unless otherwise documented below in the visit note.  Continue 1 tablet daily.  Re-check in 6 weeks.

## 2023-06-25 NOTE — Progress Notes (Signed)
 Continue 1 tablet daily. Recheck in 6 weeks.

## 2023-06-30 ENCOUNTER — Ambulatory Visit (INDEPENDENT_AMBULATORY_CARE_PROVIDER_SITE_OTHER): Payer: Medicare Other

## 2023-06-30 VITALS — Ht 73.0 in | Wt 239.0 lb

## 2023-06-30 DIAGNOSIS — Z1212 Encounter for screening for malignant neoplasm of rectum: Secondary | ICD-10-CM | POA: Diagnosis not present

## 2023-06-30 DIAGNOSIS — Z0000 Encounter for general adult medical examination without abnormal findings: Secondary | ICD-10-CM

## 2023-06-30 DIAGNOSIS — Z1211 Encounter for screening for malignant neoplasm of colon: Secondary | ICD-10-CM

## 2023-06-30 NOTE — Patient Instructions (Signed)
 [name] , Thank you for taking time out of your busy schedule to complete your Annual Wellness Visit with me. I enjoyed our conversation and look forward to speaking with you again next year. I, as well as your care team,  appreciate your ongoing commitment to your health goals. Please review the following plan we discussed and let me know if I can assist you in the future. Your Game plan/ To Do List    Referrals: If you haven't heard from the office you've been referred to, please reach out to them at the phone provided.  [name], [facility] [address], at [phone].  Follow up Visits: Next Medicare AWV with our clinical staff: 06/30/2024.   Have you seen your provider in the last 6 months (3 months if uncontrolled diabetes)? No Next Office Visit with your provider: Patient stated that he will call office to get schedule for his yearly visit.    Clinician Recommendations:  Aim for 30 minutes of exercise or brisk walking, 6-8 glasses of water, and 5 servings of fruits and vegetables each day. You are due for a tetanus vaccine and a Shingles vaccine.        This is a list of the screening recommended for you and due dates:  Health Maintenance  Topic Date Due   Zoster (Shingles) Vaccine (1 of 2) Never done   Colon Cancer Screening  05/11/2021   DTaP/Tdap/Td vaccine (2 - Td or Tdap) 09/07/2021   COVID-19 Vaccine (4 - 2024-25 season) 09/13/2022   Medicare Annual Wellness Visit  06/25/2023   Flu Shot  08/13/2023   Pneumococcal Vaccine for age over 29  Completed   Hepatitis C Screening  Completed   HPV Vaccine  Aged Out   Meningitis B Vaccine  Aged Out    Advanced directives: (Copy Requested) Please bring a copy of your health care power of attorney and living will to the office to be added to your chart at your convenience. You can mail to [facility] [address] [place], [place] [address] or email to  [email or web address] Advance Care Planning is important because it:  [x]  Makes sure you receive the medical care that is consistent with your values, goals, and preferences  [x]  It provides guidance to your family and loved ones and reduces their decisional burden about whether or not they are making the right decisions based on your wishes.  Follow the link provided in your after visit summary or read over the paperwork we have mailed to you to help you started getting your Advance Directives in place. If you need assistance in completing these, please reach out to us  so that we can help you!  See attachments for Preventive Care and Fall Prevention Tips.

## 2023-06-30 NOTE — Progress Notes (Signed)
 Subjective:   [name] is a 73 y.o. who presents for a Medicare Wellness preventive visit.  As a reminder, Annual Wellness Visits don't include a physical exam, and some assessments may be limited, especially if this visit is performed virtually. We may recommend an in-person follow-up visit with your provider if needed.  Visit Complete: Virtual I connected with  [name] on 06/30/23 by a audio enabled telemedicine application and verified that I am speaking with the correct person using two identifiers.  Patient Location: Home  Provider Location: Home Office  I discussed the limitations of evaluation and management by telemedicine. The patient expressed understanding and agreed to proceed.  Vital Signs: Because this visit was a virtual/telehealth visit, some criteria may be missing or patient reported. Any vitals not documented were not able to be obtained and vitals that have been documented are patient reported.  VideoDeclined- This patient declined Librarian, academic. Therefore the visit was completed with audio only.  Persons Participating in Visit: Patient.  AWV Questionnaire: No: Patient Medicare AWV questionnaire was not completed prior to this visit.  Cardiac Risk Factors include: advanced age (>35men, >30 women);male gender;hypertension;obesity (BMI >30kg/m2);Other (see comment), Risk factor comments: OSA, PAH     Objective:    Today's Vitals   06/30/23 1424  Weight: 239 lb (108.4 kg)  Height: 6' 1 (1.854 m)   Body mass index is 31.53 kg/m.     06/30/2023    2:33 PM 06/25/2022   11:04 AM 06/23/2021   10:56 AM 08/08/2020    1:47 PM 10/12/2019    9:49 AM 08/14/2016   12:15 PM 11/01/2015    8:45 AM  Advanced Directives  Does Patient Have a Medical Advance Directive? Yes No Yes No Yes No  No   Type of Estate agent of [place];Living will  Healthcare Power of [place];Living will  Healthcare Power of  [place];Living will    Does patient want to make changes to medical advance directive?     No - Patient declined    Copy of Healthcare Power of Attorney in Chart? No - copy requested  No - copy requested  No - copy requested    Would patient like information on creating a medical advance directive?  No - Patient declined  No - Patient declined  No - Patient declined  No - patient declined information      Data saved with a previous flowsheet row definition    Current Medications (verified) Outpatient Encounter Medications as of 06/30/2023  Medication Sig   allopurinol  (ZYLOPRIM ) 100 MG tablet TAKE 1 TABLET BY MOUTH DAILY   atorvastatin  (LIPITOR) 40 MG tablet TAKE 1 TABLET BY MOUTH DAILY   Cholecalciferol (VITAMIN D3) 50 MCG (2000 UT) capsule Take 2,000 Units by mouth daily.   clobetasol cream (TEMOVATE) 0.05 % Apply 1 Application topically 2 (two) times daily.   digoxin  (LANOXIN ) 0.125 MG tablet TAKE 1 TABLET BY MOUTH DAILY   diltiazem  (TIAZAC ) 360 MG 24 hr capsule TAKE 1 CAPSULE BY MOUTH DAILY   hydrochlorothiazide  (MICROZIDE ) 12.5 MG capsule TAKE 1 CAPSULE BY MOUTH DAILY   traZODone  (DESYREL ) 50 MG tablet Take 0.5-1 tablets (25-50 mg total) by mouth at bedtime as needed for sleep.   VIAGRA  100 MG tablet TAKE 1/2 TO 1 (ONE-HALF TO ONE) TABLET BY MOUTH DAILY AS NEEDED FOR  ERECTILE  DYSFUNCTION   warfarin (COUMADIN ) 5 MG tablet TAKE 1 TABLET BY MOUTH DAILY OR AS DIRECTED BY  ANTICOAGULATION CLINIC   augmented betamethasone dipropionate (DIPROLENE-AF) 0.05 % cream Apply 1 application topically 2 (two) times daily. (Patient not taking: Reported on 06/30/2023)   diclofenac  Sodium (VOLTAREN ) 1 % GEL Apply 2 g topically 4 (four) times daily. (Patient not taking: Reported on 06/30/2023)   dicyclomine  (BENTYL ) 20 MG tablet Take 1 tablet (20 mg total) by mouth 3 (three) times daily as needed for spasms (abdominal pain/spasm). (Patient not taking: Reported on 06/30/2023)   HYDROcodone -acetaminophen   (NORCO) 7.5-325 MG tablet Take 1 tablet by mouth every 6 (six) hours as needed for moderate pain. (Patient not taking: Reported on 06/30/2023)   No facility-administered encounter medications on file as of 06/30/2023.    Allergies (verified) Crestor [rosuvastatin]   History: Past Medical History:  Diagnosis Date   AAA (abdominal aortic aneurysm) [facility]) July 2013   Abdominal Aortic Endovascular Stent Graft 08/11/11. Bilateral. Right is 3.2 & the left is 3.6. Preclose repair bilateral common femoral artery. Placement of catheter in aorta x 2. Repair of aorta w/modular bifurcated prosthesis w/bilateral limbs.  Placement of right limb extension: 23 mm x 12 cm. Placement of left limb extension: 14 mm x 7 cm.   Anemia    From GI bleed in 05/11/11.    Chronic anticoagulation, PE DVT PAF    Chronic pain    Depression    Dyslipidemia    Edema of left lower extremity    S/P I&D 08/30/06   GI bleed 04/2011   With anemia. EGD & colonoscopy 05/11/2011   History of alcohol dependence (HCC)    HTN (hypertension)    Hyperlipemia    Hypertension    Iliac artery aneurysm, bilateral (HCC)    PVD, bilat common iliac aneurysms [443.9]   Iliopsoas muscle hematoma 04/2011   Lung nodule 2008   Morbid obesity (HCC)    Morbid obesity with BMI of 40.0-44.9, adult [facility])    Obstructive sleep apnea July 2013   Not wearing CPAP. Split-Night sleep study 06/03/11 AHI = 27.43/hr (TOTAL SLEEP TIME 105 minutes) and REM AHI = 120.00/hr   Osteoarthritis of both knees    Bilateral Arthroscopy in 2000 & 2008. Both were complicated w/venous thromboembolism S/P IVC filter placement due to one being massive PE in 2008.   PAF (paroxysmal atrial fibrillation)/atrial flutter 02/28/2010   ECHO : normal LV size, moderate concentricLVH, normal EF, normal atrial sizes, RV was normal but previously dilated;;  Myoview : Normal perfusion study. Low risk scan.   PAH (pulmonary artery hypertension) (HCC)    Prolonged QT interval     Psoriasis    Pulmonary embolism (HCC) 2008   DVT --> Massive PE with right-sided strain. On coumadin . ECHO 02/28/10 showed normal LV size, moderate concentricLVH, normal EF, normal atrial sizes, RV was normal but previously dilated.   Past Surgical History:  Procedure Laterality Date   ABDOMINAL AORTAGRAM N/A 05/14/2011   Procedure: ABDOMINAL AORTAGRAM;  Surgeon: Arvil Lauber, MD;  Location: [facility] CATH LAB;  Service: Cardiovascular;  Laterality: N/A;   Abdominal Aortic Evar  08/11/11   Bilateral. Right is 3.2 & the left is 3.6. Preclose repair bilateral common femoral artery. Placement of catheter in aorta x 2. Repair of aorta w/modular bifurcated prosthesis w/bilateral limbs.  Placement of right limb extension: 23 mm x 12 cm. Placement of left limb extension: 14 mm x 7 cm.   ACHILLES TENDON SURGERY     COLONOSCOPY  05/11/2011   For GI bleed. Performed by Dr. Tami Falcon, MD. Also had  a EGD performed at this time.   COLONOSCOPY  05/12/2011   EMBOLIZATION N/A 07/30/2011   Procedure: EMBOLIZATION;  Surgeon: Arvil Lauber, MD;  Location: [facility] CATH LAB;  Service: Cardiovascular;  Laterality: N/A;   ESOPHAGOGASTRODUODENOSCOPY  05/08/2011   For GI bleed/Anemia. Also had a colonoscopy at this time.   INSERTION OF VENA CAVA FILTER N/A 05/14/2011   Procedure: INSERTION OF VENA CAVA FILTER;  Surgeon: Arvil Lauber, MD;  Location: [facility] CATH LAB;  Service: Cardiovascular;  Laterality: N/A;   KNEE ARTHROSCOPY Bilateral 2000 & 2008   DJD knees. Both were complicated w/venous thromboembolism S/P IVC filter placement due to one being massive PE in 2008.   KNEE SURGERY  1999   LOWER EXTREMITY ANGIOGRAM N/A 05/14/2011   Procedure: LOWER EXTREMITY ANGIOGRAM;  Surgeon: Arvil Lauber, MD;  Location: [facility] CATH LAB;  Service: Cardiovascular;  Laterality: N/A;   Family History  Problem Relation Age of Onset   Cancer Mother        Pancreatic   Arthritis Mother    Arthritis Father    CAD Father 66   Cancer Brother        Colon    Fainting Neg Hx    Social History   Socioeconomic History   Marital status: Divorced    Spouse name: Not on file   Number of children: 1   Years of education: Scientist, product/process development   Highest education level: Not on file  Occupational History   Occupation: RETIRED    Employer: [employer]  Tobacco Use   Smoking status: Former    Current packs/day: 0.00    Average packs/day: 0.5 packs/day for 2.0 years (1.0 ttl pk-yrs)    Types: Cigarettes    Start date: 07/04/2004    Quit date: 07/05/2006    Years since quitting: 16.9   Smokeless tobacco: Never  Vaping Use   Vaping status: Never Used  Substance and Sexual Activity   Alcohol use: Yes    Alcohol/week: 1.0 - 2.0 standard drink of alcohol    Types: 1 - 2 Shots of liquor per week   Drug use: Yes   Sexual activity: Yes  Other Topics Concern   Not on file  Social History Narrative   He is a divorced father of one, grandfather 2. He tries to exercise, and had been doing it 6 days a week. He had a fall earlier last year inserted got out of the habit. He also working on his diet and sort of got out of that habit as well. He did quit drinking a few years ago and does not smoke.      Lives alone/2025   Social Drivers of Health   Financial Resource Strain: Low Risk  (06/30/2023)   Overall Financial Resource Strain (CARDIA)    Difficulty of Paying Living Expenses: Not hard at all  Food Insecurity: No Food Insecurity (06/30/2023)   Hunger Vital Sign    Worried About Running Out of Food in the Last Year: Never true    Ran Out of Food in the Last Year: Never true  Transportation Needs: No Transportation Needs (06/30/2023)   PRAPARE - Administrator, Civil Service (Medical): No    Lack of Transportation (Non-Medical): No  Physical Activity: Sufficiently Active (06/30/2023)   Exercise Vital Sign    Days of Exercise per Week: 4 days    Minutes of Exercise per Session: 60 min  Stress: No Stress Concern Present (06/30/2023)   [place]  [employer] - Occupational Stress Questionnaire    Feeling of Stress: Not at all  Social Connections: Unknown (06/30/2023)   Social Connection and Isolation Panel    Frequency of Communication with Friends and Family: More than three times a week    Frequency of Social Gatherings with Friends and Family: Three times a week    Attends Religious Services: Never    Active Member of Clubs or Organizations: No    Attends Banker Meetings: Never    Marital Status: Not on file    Tobacco Counseling Counseling given: Not Answered    Clinical Intake:  Pre-visit preparation completed: Yes  Pain : No/denies pain     BMI - recorded: 31.53 Nutritional Status: BMI > 30  Obese Nutritional Risks: None Diabetes: No  Lab Results  Component Value Date   HGBA1C 5.7 06/03/2021   HGBA1C 5.8 09/19/2020   HGBA1C 5.9 03/29/2019     How often do you need to have someone help you when you read instructions, pamphlets, or other written materials from your doctor or pharmacy?: 1 - Never  Interpreter Needed?: No  Information entered by :: Dolce Sylvia, RMA   Activities of Daily Living     06/30/2023    2:26 PM  In your present state of health, do you have any difficulty performing the following activities:  Hearing? 0  Vision? 0  Difficulty concentrating or making decisions? 0  Walking or climbing stairs? 0  Dressing or bathing? 0  Doing errands, shopping? 0  Preparing Food and eating ? N  Using the Toilet? N  In the past six months, have you accidently leaked urine? N  Do you have problems with loss of bowel control? N  Managing your Medications? N  Managing your Finances? N  Housekeeping or managing your Housekeeping? N    Patient Care Team: Roslyn Coombe, MD as PCP - General (Internal Medicine) Candi Chafe, Pearletha Bouche, MD as Consulting Physician (Ophthalmology)  I have updated your Care Teams any recent Medical Services you may have received from  other providers in the past year.     Assessment:   This is a routine wellness examination for [name].  Hearing/Vision screen Hearing Screening - Comments:: Denies hearing difficulties   Vision Screening - Comments:: Denies vision issues./     Goals Addressed             This Visit's Progress    My goal for 2024 is to lose weight.  I would like to lose 20 pounds.   On track      Depression Screen     06/30/2023    2:46 PM 06/25/2022   11:11 AM 06/23/2021   10:58 AM 06/03/2021    3:45 PM 09/19/2020   10:07 AM 10/12/2019    9:50 AM 08/22/2018    9:16 AM  PHQ 2/9 Scores  PHQ - 2 Score 0 0 0 0 1 0 1  PHQ- 9 Score 0 0         Fall Risk     06/30/2023    2:34 PM 06/25/2022   11:05 AM 06/23/2021   10:57 AM 06/03/2021    3:45 PM 09/19/2020   10:07 AM  Fall Risk   Falls in the past year? 0 0 0 0 0  Number falls in past yr: 0 0 0 0 0  Injury with Fall? 0 0 0 0 0  Risk for fall due to :  No Fall Risks  Follow up Falls evaluation completed;Falls prevention discussed Falls prevention discussed Falls evaluation completed        Data saved with a previous flowsheet row definition    MEDICARE RISK AT HOME:  Medicare Risk at Home Any stairs in or around the home?: Yes (coming into home) If so, are there any without handrails?: No Home free of loose throw rugs in walkways, pet beds, electrical cords, etc?: Yes Adequate lighting in your home to reduce risk of falls?: Yes Life alert?: No Use of a cane, walker or w/c?: No Grab bars in the bathroom?: No Shower chair or bench in shower?: No Elevated toilet seat or a handicapped toilet?: No  TIMED UP AND GO:  Was the test performed?  No  Cognitive Function: Declined/Normal: No cognitive concerns noted by patient or family. Patient alert, oriented, able to answer questions appropriately and recall recent events. No signs of memory loss or confusion.        06/25/2022   11:12 AM  6CIT Screen  What Year? 0 points  What month? 0  points  What time? 0 points  Count back from 20 0 points  Months in reverse 0 points  Repeat phrase 0 points  Total Score 0 points    Immunizations Immunization History  Administered Date(s) Administered   Fluad Quad(high Dose 65+) 09/02/2018, 09/29/2019, 09/19/2020, 09/19/2021   Fluad Trivalent(High Dose 65+) 10/30/2022   Influenza Split 11/03/2011   Influenza, High Dose Seasonal PF 11/01/2014, 10/02/2015, 09/09/2016, 10/01/2017   Influenza,inj,Quad PF,6+ Mos 10/18/2012, 09/26/2013   PFIZER(Purple Top)SARS-COV-2 Vaccination 02/03/2019, 02/24/2019, 04/09/2020   Pneumococcal Conjugate-13 02/05/2016   Pneumococcal Polysaccharide-23 07/09/2017   Tdap 09/08/2011    Screening Tests Health Maintenance  Topic Date Due   Zoster Vaccines- Shingrix (1 of 2) Never done   Colonoscopy  05/11/2021   DTaP/Tdap/Td (2 - Td or Tdap) 09/07/2021   COVID-19 Vaccine (4 - 2024-25 season) 09/13/2022   Medicare Annual Wellness (AWV)  06/25/2023   INFLUENZA VACCINE  08/13/2023   Pneumococcal Vaccine: 50+ Years  Completed   Hepatitis C Screening  Completed   HPV VACCINES  Aged Out   Meningococcal B Vaccine  Aged Out    Health Maintenance  Health Maintenance Due  Topic Date Due   Zoster Vaccines- Shingrix (1 of 2) Never done   Colonoscopy  05/11/2021   DTaP/Tdap/Td (2 - Td or Tdap) 09/07/2021   COVID-19 Vaccine (4 - 2024-25 season) 09/13/2022   Medicare Annual Wellness (AWV)  06/25/2023   Health Maintenance Items Addressed: Referral sent to GI for colonoscopy, See Nurse Notes at the end of this note  Additional Screening:  Vision Screening: Recommended annual ophthalmology exams for early detection of glaucoma and other disorders of the eye. Would you like a referral to an eye doctor? No    Dental Screening: Recommended annual dental exams for proper oral hygiene  Community Resource Referral / Chronic Care Management: CRR required this visit?  No   CCM required this visit?   No   Plan:    I have personally reviewed and noted the following in the patient's chart:   Medical and social history Use of alcohol, tobacco or illicit drugs  Current medications and supplements including opioid prescriptions. Patient is not currently taking opioid prescriptions. Functional ability and status Nutritional status Physical activity Advanced directives List of other physicians Hospitalizations, surgeries, and ER visits in previous 12 months Vitals Screenings to include cognitive, depression, and falls Referrals and appointments  In addition, I have  reviewed and discussed with patient certain preventive protocols, quality metrics, and best practice recommendations. A written personalized care plan for preventive services as well as general preventive health recommendations were provided to patient.   Mellonie Guess L Mumin Denomme, CMA   06/30/2023   After Visit Summary: (Mail) Due to this being a telephonic visit, the after visit summary with patients personalized plan was offered to patient via mail   Notes: Patient is due for a Tdap and a Shingrix vaccines.  He stated that he will call the office back to get scheduled for his annual visit.  Patient is also due for a colonoscopy and order has been placed today.

## 2023-08-06 ENCOUNTER — Ambulatory Visit

## 2023-08-06 DIAGNOSIS — Z7901 Long term (current) use of anticoagulants: Secondary | ICD-10-CM

## 2023-08-06 LAB — POCT INR: INR: 2.3 (ref 2.0–3.0)

## 2023-08-06 NOTE — Patient Instructions (Addendum)
Pre visit review using our clinic review tool, if applicable. No additional management support is needed unless otherwise documented below in the visit note.  Continue 1 tablet daily. Recheck in 7 weeks

## 2023-08-06 NOTE — Progress Notes (Signed)
 Continue 1 tablet daily. Recheck in 7 weeks.

## 2023-08-27 ENCOUNTER — Other Ambulatory Visit: Payer: Self-pay | Admitting: Internal Medicine

## 2023-09-01 ENCOUNTER — Other Ambulatory Visit (HOSPITAL_COMMUNITY): Payer: Self-pay

## 2023-09-01 ENCOUNTER — Telehealth: Payer: Self-pay

## 2023-09-01 NOTE — Telephone Encounter (Signed)
 Pharmacy Patient Advocate Encounter   Received notification from Pt Calls Messages that prior authorization for Atorvastatin  40mg   is required/requested.   Insurance verification completed.   The patient is insured through [employer] .   Per test claim: Refill too soon. PA is not needed at this time. Medication was filled 06/23/23. Next eligible fill date is 09/06/23.   The Atorvastatin  40mg  did not require a prior authorization, was a refill too soon. May need refills sent to the pharmacy.

## 2023-09-01 NOTE — Telephone Encounter (Signed)
 Copied from CRM #[id number]. Topic: Clinical - Medication Prior Auth >> Sep 01, 2023 10:22 AM Aleatha C wrote: Reason for CRM: Patient needs a pre authorization for Atorvastatin  Calcium  40 mg Oral Daily  [id number]  HCl ER Beads 360 mg Oral Daily  Digoxin  125 mcg Oral Daily  Allopurinol  100 mg Oral Daily

## 2023-09-03 ENCOUNTER — Other Ambulatory Visit: Payer: Self-pay

## 2023-09-03 MED ORDER — DIGOXIN 125 MCG PO TABS: 125.0000 ug | ORAL_TABLET | Freq: Every day | ORAL | 3 refills | Status: DC

## 2023-09-03 MED ORDER — ATORVASTATIN CALCIUM 40 MG PO TABS: 40.0000 mg | ORAL_TABLET | Freq: Every day | ORAL | 3 refills | Status: DC

## 2023-09-03 MED ORDER — ALLOPURINOL 100 MG PO TABS: 100.0000 mg | ORAL_TABLET | Freq: Every day | ORAL | 3 refills | Status: DC

## 2023-09-03 MED ORDER — DILTIAZEM HCL ER BEADS 360 MG PO CP24: 360.0000 mg | ORAL_CAPSULE | Freq: Every day | ORAL | 3 refills | Status: DC

## 2023-09-03 NOTE — Telephone Encounter (Signed)
Medication send to pharmacy. 

## 2023-09-14 ENCOUNTER — Other Ambulatory Visit: Payer: Self-pay | Admitting: Internal Medicine

## 2023-09-17 ENCOUNTER — Encounter: Payer: Self-pay | Admitting: Internal Medicine

## 2023-09-17 ENCOUNTER — Telehealth: Payer: Self-pay | Admitting: Radiology

## 2023-09-17 NOTE — Telephone Encounter (Signed)
 Copied from CRM [id number]. Topic: Clinical - Medication Question >> Sep 17, 2023 10:25 AM Anairis L wrote: Reason for CRM: Patient is requesting a call back as to why his medication was refused.

## 2023-09-20 ENCOUNTER — Telehealth: Payer: Self-pay

## 2023-09-20 NOTE — Telephone Encounter (Signed)
 Copied from CRM [id number]. Topic: General - Other >> Sep 20, 2023  2:16 PM Thersia C wrote: Reason for CRM: Patient called in regarding needing [facility] Prescription  [id number] - [place] ([place] - [address] [place] ([place]) [place] [address] Phone: [phone] Fax: [phone]

## 2023-09-23 ENCOUNTER — Other Ambulatory Visit: Payer: Self-pay

## 2023-09-23 MED ORDER — ALLOPURINOL 100 MG PO TABS: 100.0000 mg | ORAL_TABLET | Freq: Every day | ORAL | 3 refills | Status: DC

## 2023-09-23 MED ORDER — DIGOXIN 125 MCG PO TABS: 125.0000 ug | ORAL_TABLET | Freq: Every day | ORAL | 3 refills | Status: DC

## 2023-09-23 MED ORDER — ATORVASTATIN CALCIUM 40 MG PO TABS: 40.0000 mg | ORAL_TABLET | Freq: Every day | ORAL | 3 refills | Status: DC

## 2023-09-23 MED ORDER — DILTIAZEM HCL ER BEADS 360 MG PO CP24: 360.0000 mg | ORAL_CAPSULE | Freq: Every day | ORAL | 3 refills | Status: DC

## 2023-09-23 NOTE — Telephone Encounter (Signed)
 Called and spoke with patient. Patient has since made a follow up appointment with Dr.John for tomorrow

## 2023-09-24 ENCOUNTER — Ambulatory Visit: Admitting: Internal Medicine

## 2023-09-24 ENCOUNTER — Encounter: Payer: Self-pay | Admitting: Internal Medicine

## 2023-09-24 ENCOUNTER — Ambulatory Visit

## 2023-09-24 ENCOUNTER — Ambulatory Visit: Payer: Self-pay | Admitting: Internal Medicine

## 2023-09-24 VITALS — BP 118/80 | HR 71 | Temp 98.1°F | Ht 73.0 in | Wt 234.0 lb

## 2023-09-24 DIAGNOSIS — L409 Psoriasis, unspecified: Secondary | ICD-10-CM | POA: Diagnosis not present

## 2023-09-24 DIAGNOSIS — R739 Hyperglycemia, unspecified: Secondary | ICD-10-CM

## 2023-09-24 DIAGNOSIS — Z125 Encounter for screening for malignant neoplasm of prostate: Secondary | ICD-10-CM | POA: Diagnosis not present

## 2023-09-24 DIAGNOSIS — E785 Hyperlipidemia, unspecified: Secondary | ICD-10-CM | POA: Diagnosis not present

## 2023-09-24 DIAGNOSIS — Z7901 Long term (current) use of anticoagulants: Secondary | ICD-10-CM | POA: Diagnosis not present

## 2023-09-24 DIAGNOSIS — Z0001 Encounter for general adult medical examination with abnormal findings: Secondary | ICD-10-CM

## 2023-09-24 DIAGNOSIS — E538 Deficiency of other specified B group vitamins: Secondary | ICD-10-CM | POA: Diagnosis not present

## 2023-09-24 DIAGNOSIS — Z23 Encounter for immunization: Secondary | ICD-10-CM

## 2023-09-24 DIAGNOSIS — I10 Essential (primary) hypertension: Secondary | ICD-10-CM

## 2023-09-24 DIAGNOSIS — E559 Vitamin D deficiency, unspecified: Secondary | ICD-10-CM | POA: Diagnosis not present

## 2023-09-24 LAB — CBC WITH DIFFERENTIAL/PLATELET
Basophils Absolute: 0 K/uL (ref 0.0–0.1)
Basophils Relative: 0.7 % (ref 0.0–3.0)
Eosinophils Absolute: 0.3 K/uL (ref 0.0–0.7)
Eosinophils Relative: 5.8 % — ABNORMAL HIGH (ref 0.0–5.0)
HCT: 43.5 % (ref 39.0–52.0)
Hemoglobin: 14.4 g/dL (ref 13.0–17.0)
Lymphocytes Relative: 27.6 % (ref 12.0–46.0)
Lymphs Abs: 1.6 K/uL (ref 0.7–4.0)
MCHC: 33.1 g/dL (ref 30.0–36.0)
MCV: 97 fl (ref 78.0–100.0)
Monocytes Absolute: 0.8 K/uL (ref 0.1–1.0)
Monocytes Relative: 13 % — ABNORMAL HIGH (ref 3.0–12.0)
Neutro Abs: 3.1 K/uL (ref 1.4–7.7)
Neutrophils Relative %: 52.9 % (ref 43.0–77.0)
Platelets: 184 K/uL (ref 150.0–400.0)
RBC: 4.48 Mil/uL (ref 4.22–5.81)
RDW: 15.1 % (ref 11.5–15.5)
WBC: 5.9 K/uL (ref 4.0–10.5)

## 2023-09-24 LAB — BASIC METABOLIC PANEL WITH GFR
BUN: 19 mg/dL (ref 6–23)
CO2: 29 meq/L (ref 19–32)
Calcium: 10.1 mg/dL (ref 8.4–10.5)
Chloride: 102 meq/L (ref 96–112)
Creatinine, Ser: 1.34 mg/dL (ref 0.40–1.50)
GFR: 52.53 mL/min — ABNORMAL LOW (ref >60.00)
Glucose, Bld: 79 mg/dL (ref 70–99)
Potassium: 3.4 meq/L — ABNORMAL LOW (ref 3.5–5.1)
Sodium: 142 meq/L (ref 135–145)

## 2023-09-24 LAB — LIPID PANEL
Cholesterol: 120 mg/dL (ref 0–200)
HDL: 42.5 mg/dL (ref >39.00)
LDL Cholesterol: 59 mg/dL (ref 0–99)
NonHDL: 77.73
Total CHOL/HDL Ratio: 3
Triglycerides: 92 mg/dL (ref 0.0–149.0)
VLDL: 18.4 mg/dL (ref 0.0–40.0)

## 2023-09-24 LAB — PSA: PSA: 0.76 ng/mL (ref 0.10–4.00)

## 2023-09-24 LAB — POCT INR: INR: 1.8 — AB (ref 2.0–3.0)

## 2023-09-24 LAB — HEPATIC FUNCTION PANEL
ALT: 15 U/L (ref 0–53)
AST: 20 U/L (ref 0–37)
Albumin: 4 g/dL (ref 3.5–5.2)
Alkaline Phosphatase: 132 U/L — ABNORMAL HIGH (ref 39–117)
Bilirubin, Direct: 0.2 mg/dL (ref 0.0–0.3)
Total Bilirubin: 0.8 mg/dL (ref 0.2–1.2)
Total Protein: 7.6 g/dL (ref 6.0–8.3)

## 2023-09-24 LAB — VITAMIN D 25 HYDROXY (VIT D DEFICIENCY, FRACTURES): VITD: 53.53 ng/mL (ref 30.00–100.00)

## 2023-09-24 LAB — TSH: TSH: 1.67 u[IU]/mL (ref 0.35–5.50)

## 2023-09-24 LAB — VITAMIN B12: Vitamin B-12: 771 pg/mL (ref 211–911)

## 2023-09-24 LAB — HEMOGLOBIN A1C: Hgb A1c MFr Bld: 6.1 % (ref 4.6–6.5)

## 2023-09-24 MED ORDER — CLOBETASOL PROPIONATE 0.05 % EX CREA: 1.0000 | TOPICAL_CREAM | Freq: Two times a day (BID) | CUTANEOUS | 1 refills | Status: AC

## 2023-09-24 NOTE — Progress Notes (Unsigned)
 Patient ID: [name], male   DOB: Jun 05, 1950, 73 y.o.   MRN: [id number]         Chief Complaint:: wellness exam and psoriasis, hld, htn, hyperglycemia, low vit d       HPI:  [name] is a 73 y.o. male here for wellness exam; for flu shot today, for tdap and shingrix at pharmacy, declines colonoscopy, o/w up to date                        Also Pt denies chest pain, increased sob or doe, wheezing, orthopnea, PND, increased LE swelling, palpitations, dizziness or syncope.   Pt denies polydipsia, polyuria, or new focal neuro s/s.    Pt denies fever, wt loss, night sweats, loss of appetite, or other constitutional symptoms  Does have flare of psoriasis and asking for restart topical steroid.  Goes to gym 3 times per wk with rowing machine x 4  miles.  Peak wt has been 362.   Wt Readings from Last 3 Encounters:  09/24/23 234 lb (106.1 kg)  06/30/23 239 lb (108.4 kg)  06/25/22 239 lb (108.4 kg)   BP Readings from Last 3 Encounters:  09/24/23 118/80  01/30/22 128/80  06/03/21 130/82   Immunization History  Administered Date(s) Administered   Fluad Quad(high Dose 65+) 09/02/2018, 09/29/2019, 09/19/2020, 09/19/2021   Fluad Trivalent(High Dose 65+) 10/30/2022   INFLUENZA, HIGH DOSE SEASONAL PF 11/01/2014, 10/02/2015, 09/09/2016, 10/01/2017, 09/24/2023   Influenza Split 11/03/2011   Influenza,inj,Quad PF,6+ Mos 10/18/2012, 09/26/2013   PFIZER(Purple Top)SARS-COV-2 Vaccination 02/03/2019, 02/24/2019, 04/09/2020   Pneumococcal Conjugate-13 02/05/2016   Pneumococcal Polysaccharide-23 07/09/2017   Tdap 09/08/2011   Health Maintenance Due  Topic Date Due   Zoster Vaccines- Shingrix (1 of 2) Never done   Colonoscopy  05/11/2021   DTaP/Tdap/Td (2 - Td or Tdap) 09/07/2021      Past Medical History:  Diagnosis Date   AAA (abdominal aortic aneurysm) [facility]) July 2013   Abdominal Aortic Endovascular Stent Graft 08/11/11. Bilateral. Right is 3.2 & the left is 3.6. Preclose repair bilateral  common femoral artery. Placement of catheter in aorta x 2. Repair of aorta w/modular bifurcated prosthesis w/bilateral limbs.  Placement of right limb extension: 23 mm x 12 cm. Placement of left limb extension: 14 mm x 7 cm.   Anemia    From GI bleed in 05/11/11.    Chronic anticoagulation, PE DVT PAF    Chronic pain    Depression    Dyslipidemia    Edema of left lower extremity    S/P I&D 08/30/06   GI bleed 04/2011   With anemia. EGD & colonoscopy 05/11/2011   History of alcohol dependence (HCC)    HTN (hypertension)    Hyperlipemia    Hypertension    Iliac artery aneurysm, bilateral (HCC)    PVD, bilat common iliac aneurysms [443.9]   Iliopsoas muscle hematoma 04/2011   Lung nodule 2008   Morbid obesity (HCC)    Morbid obesity with BMI of 40.0-44.9, adult [facility])    Obstructive sleep apnea July 2013   Not wearing CPAP. Split-Night sleep study 06/03/11 AHI = 27.43/hr (TOTAL SLEEP TIME 105 minutes) and REM AHI = 120.00/hr   Osteoarthritis of both knees    Bilateral Arthroscopy in 2000 & 2008. Both were complicated w/venous thromboembolism S/P IVC filter placement due to one being massive PE in 2008.   PAF (paroxysmal atrial fibrillation)/atrial flutter 02/28/2010   ECHO : normal  LV size, moderate concentricLVH, normal EF, normal atrial sizes, RV was normal but previously dilated;;  Myoview : Normal perfusion study. Low risk scan.   PAH (pulmonary artery hypertension) (HCC)    Prolonged QT interval    Psoriasis    Pulmonary embolism (HCC) 2008   DVT --> Massive PE with right-sided strain. On coumadin . ECHO 02/28/10 showed normal LV size, moderate concentricLVH, normal EF, normal atrial sizes, RV was normal but previously dilated.   Past Surgical History:  Procedure Laterality Date   ABDOMINAL AORTAGRAM N/A 05/14/2011   Procedure: ABDOMINAL AORTAGRAM;  Surgeon: Redell LITTIE Door, MD;  Location: [facility] CATH LAB;  Service: Cardiovascular;  Laterality: N/A;   Abdominal Aortic Evar  08/11/11    Bilateral. Right is 3.2 & the left is 3.6. Preclose repair bilateral common femoral artery. Placement of catheter in aorta x 2. Repair of aorta w/modular bifurcated prosthesis w/bilateral limbs.  Placement of right limb extension: 23 mm x 12 cm. Placement of left limb extension: 14 mm x 7 cm.   ACHILLES TENDON SURGERY     COLONOSCOPY  05/11/2011   For GI bleed. Performed by Dr. Renaye Sous, MD. Also had a EGD performed at this time.   COLONOSCOPY  05/12/2011   EMBOLIZATION N/A 07/30/2011   Procedure: EMBOLIZATION;  Surgeon: Redell LITTIE Door, MD;  Location: [facility] CATH LAB;  Service: Cardiovascular;  Laterality: N/A;   ESOPHAGOGASTRODUODENOSCOPY  05/08/2011   For GI bleed/Anemia. Also had a colonoscopy at this time.   INSERTION OF VENA CAVA FILTER N/A 05/14/2011   Procedure: INSERTION OF VENA CAVA FILTER;  Surgeon: Redell LITTIE Door, MD;  Location: [facility] CATH LAB;  Service: Cardiovascular;  Laterality: N/A;   KNEE ARTHROSCOPY Bilateral 2000 & 2008   DJD knees. Both were complicated w/venous thromboembolism S/P IVC filter placement due to one being massive PE in 2008.   KNEE SURGERY  1999   LOWER EXTREMITY ANGIOGRAM N/A 05/14/2011   Procedure: LOWER EXTREMITY ANGIOGRAM;  Surgeon: Redell LITTIE Door, MD;  Location: [facility] CATH LAB;  Service: Cardiovascular;  Laterality: N/A;    reports that he quit smoking about 17 years ago. His smoking use included cigarettes. He started smoking about 19 years ago. He has a 1 pack-year smoking history. He has never used smokeless tobacco. He reports current alcohol use of about 1.0 - 2.0 standard drink of alcohol per week. He reports current drug use. family history includes Arthritis in his father and mother; CAD (age of onset: 43) in his father; Cancer in his brother and mother. Allergies  Allergen Reactions   Crestor [Rosuvastatin] Swelling   Current Outpatient Medications on File Prior to Visit  Medication Sig Dispense Refill   allopurinol  (ZYLOPRIM ) 100 MG tablet Take 1 tablet (100 mg  total) by mouth daily. 90 tablet 3   atorvastatin  (LIPITOR) 40 MG tablet Take 1 tablet (40 mg total) by mouth daily. 90 tablet 3   Cholecalciferol (VITAMIN D3) 50 MCG (2000 UT) capsule Take 2,000 Units by mouth daily.     digoxin  (LANOXIN ) 0.125 MG tablet Take 1 tablet (125 mcg total) by mouth daily. 90 tablet 3   diltiazem  (TIAZAC ) 360 MG 24 hr capsule Take 1 capsule (360 mg total) by mouth daily. 90 capsule 3   hydrochlorothiazide  (MICROZIDE ) 12.5 MG capsule TAKE 1 CAPSULE BY MOUTH DAILY 100 capsule 2   traZODone  (DESYREL ) 50 MG tablet Take 0.5-1 tablets (25-50 mg total) by mouth at bedtime as needed for sleep. 30 tablet 3   VIAGRA  100  MG tablet TAKE 1/2 TO 1 (ONE-HALF TO ONE) TABLET BY MOUTH DAILY AS NEEDED FOR  ERECTILE  DYSFUNCTION 10 tablet 0   warfarin (COUMADIN ) 5 MG tablet TAKE 1 TABLET BY MOUTH DAILY OR AS DIRECTED BY ANTICOAGULATION CLINIC 105 tablet 1   augmented betamethasone dipropionate (DIPROLENE-AF) 0.05 % cream Apply 1 application topically 2 (two) times daily. (Patient not taking: Reported on 09/24/2023)     diclofenac  Sodium (VOLTAREN ) 1 % GEL Apply 2 g topically 4 (four) times daily. (Patient not taking: Reported on 09/24/2023) 150 g 0   dicyclomine  (BENTYL ) 20 MG tablet Take 1 tablet (20 mg total) by mouth 3 (three) times daily as needed for spasms (abdominal pain/spasm). (Patient not taking: Reported on 09/24/2023) 20 tablet 0   HYDROcodone -acetaminophen  (NORCO) 7.5-325 MG tablet Take 1 tablet by mouth every 6 (six) hours as needed for moderate pain. (Patient not taking: Reported on 09/24/2023) 30 tablet 0   No current facility-administered medications on file prior to visit.        ROS:  All others reviewed and negative.  Objective        PE:  BP 118/80   Pulse 71   Temp 98.1 F (36.7 C)   Ht 6' 1 (1.854 m)   Wt 234 lb (106.1 kg)   SpO2 96%   BMI 30.87 kg/m                 Constitutional: Pt appears in NAD               HENT: Head: NCAT.                Right Ear:  External ear normal.                 Left Ear: External ear normal.                Eyes: . Pupils are equal, round, and reactive to light. Conjunctivae and EOM are normal               Nose: without d/c or deformity               Neck: Neck supple. Gross normal ROM               Cardiovascular: Normal rate and regular rhythm.                 Pulmonary/Chest: Effort normal and breath sounds without rales or wheezing.                Abd:  Soft, NT, ND, + BS, no organomegaly               Neurological: Pt is alert. At baseline orientation, motor grossly intact               Skin: Skin is warm., LE edema - none, has psoriasis plaque to back torso               Psychiatric: Pt behavior is normal without agitation   Micro: none  Cardiac tracings I have personally interpreted today:  none  Pertinent Radiological findings (summarize): none   Lab Results  Component Value Date   WBC 5.9 09/24/2023   HGB 14.4 09/24/2023   HCT 43.5 09/24/2023   PLT 184.0 09/24/2023   GLUCOSE 79 09/24/2023   CHOL 120 09/24/2023   TRIG 92.0 09/24/2023   HDL 42.50 09/24/2023   LDLCALC 59 09/24/2023   ALT 15  09/24/2023   AST 20 09/24/2023   NA 142 09/24/2023   K 3.4 (L) 09/24/2023   CL 102 09/24/2023   CREATININE 1.34 09/24/2023   BUN 19 09/24/2023   CO2 29 09/24/2023   TSH 1.67 09/24/2023   PSA 0.76 09/24/2023   INR 1.8 (A) 09/24/2023   HGBA1C 6.1 09/24/2023   Assessment/Plan:  [name] is a 73 y.o. Black or African American [2] male with  has a past medical history of AAA (abdominal aortic aneurysm) (HCC) (July 2013), Anemia, Chronic anticoagulation, PE DVT PAF, Chronic pain, Depression, Dyslipidemia, Edema of left lower extremity, GI bleed (04/2011), History of alcohol dependence (HCC), HTN (hypertension), Hyperlipemia, Hypertension, Iliac artery aneurysm, bilateral (HCC), Iliopsoas muscle hematoma (04/2011), Lung nodule (2008), Morbid obesity (HCC), Morbid obesity with BMI of 40.0-44.9, adult  [facility]), Obstructive sleep apnea (July 2013), Osteoarthritis of both knees, PAF (paroxysmal atrial fibrillation)/atrial flutter (02/28/2010), PAH (pulmonary artery hypertension) (HCC), Prolonged QT interval, Psoriasis, and Pulmonary embolism (HCC) (2008).  Encounter for well adult exam with abnormal findings Age and sex appropriate education and counseling updated with regular exercise and diet Referrals for preventative services - declines colonoscopy Immunizations addressed - for flu shot today, for tdap and shingrix at pharmacy Smoking counseling  - none needed Evidence for depression or other mood disorder - none significant Most recent labs reviewed. I have personally reviewed and have noted: 1) the patient's medical and social history 2) The patient's current medications and supplements 3) The patient's height, weight, and BMI have been recorded in the chart   Dyslipidemia Lab Results  Component Value Date   LDLCALC 59 09/24/2023   Stable, pt to continue current statin lipitor 40 mg qd   HTN (hypertension) BP Readings from Last 3 Encounters:  09/24/23 118/80  01/30/22 128/80  06/03/21 130/82   Stable, pt to continue medical treatment tiazac  360 every day, hct 12.5 every day,    Hyperglycemia Lab Results  Component Value Date   HGBA1C 6.1 09/24/2023   Stable, pt to continue current medical treatment  - diet, wt control   Psoriasis Uncontrolled with recent flare - for clobetasol  cream prn asd  Vitamin D  deficiency Last vitamin D  Lab Results  Component Value Date   VD25OH 53.53 09/24/2023   Stable, cont oral replacement  Followup: Return in about 6 months (around 03/23/2024).  Lynwood Rush, MD 09/25/2023 6:47 PM  [facility] Internal Medicine

## 2023-09-24 NOTE — Patient Instructions (Signed)
 You had the flu shot today  Please have your Tdap tetanus shot, and Shingles shot at the pharmacy  Please continue all other medications as before, and refills have been done if requested. - the cream for psoriasis  Please have the pharmacy call with any other refills you may need.  Please continue your efforts at being more active, low cholesterol diet, and weight control.  You are otherwise up to date with prevention measures today.  Please keep your appointments with your specialists as you may have planned  Please go to the LAB at the blood drawing area for the tests to be done  You will be contacted by phone if any changes need to be made immediately.  Otherwise, you will receive a letter about your results with an explanation, but please check with MyChart first.  Please make an Appointment to return in 6 months, or sooner if needed

## 2023-09-24 NOTE — Patient Instructions (Addendum)
Pre visit review using our clinic review tool, if applicable. No additional management support is needed unless otherwise documented below in the visit note.  Increase dose today to take 1 1/2 tablets and then continue 1 tablet daily. Recheck in 3 weeks.

## 2023-09-24 NOTE — Progress Notes (Signed)
 Pt also has apt with PCP today.  Increase dose today to take 1 1/2 tablets and then continue 1 tablet daily. Recheck in 3 weeks.

## 2023-09-25 ENCOUNTER — Encounter: Payer: Self-pay | Admitting: Internal Medicine

## 2023-09-25 NOTE — Assessment & Plan Note (Signed)
 Last vitamin D  Lab Results  Component Value Date   VD25OH 53.53 09/24/2023   Stable, cont oral replacement

## 2023-09-25 NOTE — Assessment & Plan Note (Signed)
 Uncontrolled with recent flare - for clobetasol  cream prn asd

## 2023-09-25 NOTE — Assessment & Plan Note (Signed)
 Lab Results  Component Value Date   HGBA1C 6.1 09/24/2023   Stable, pt to continue current medical treatment  - diet, wt control

## 2023-09-25 NOTE — Assessment & Plan Note (Signed)
 Age and sex appropriate education and counseling updated with regular exercise and diet Referrals for preventative services - declines colonoscopy Immunizations addressed - for flu shot today, for tdap and shingrix at pharmacy Smoking counseling  - none needed Evidence for depression or other mood disorder - none significant Most recent labs reviewed. I have personally reviewed and have noted: 1) the patient's medical and social history 2) The patient's current medications and supplements 3) The patient's height, weight, and BMI have been recorded in the chart

## 2023-09-25 NOTE — Assessment & Plan Note (Signed)
 Lab Results  Component Value Date   LDLCALC 59 09/24/2023   Stable, pt to continue current statin lipitor 40 mg qd

## 2023-09-25 NOTE — Assessment & Plan Note (Signed)
 BP Readings from Last 3 Encounters:  09/24/23 118/80  01/30/22 128/80  06/03/21 130/82   Stable, pt to continue medical treatment tiazac  360 every day, hct 12.5 every day,

## 2023-09-27 LAB — URINALYSIS, ROUTINE W REFLEX MICROSCOPIC
Bilirubin Urine: NEGATIVE
Hgb urine dipstick: NEGATIVE
Ketones, ur: NEGATIVE
Leukocytes,Ua: NEGATIVE
Nitrite: NEGATIVE
RBC / HPF: NONE SEEN (ref 0–?)
Specific Gravity, Urine: 1.025 (ref 1.000–1.030)
Total Protein, Urine: NEGATIVE
Urine Glucose: NEGATIVE
Urobilinogen, UA: 1 (ref 0.0–1.0)
pH: 6 (ref 5.0–8.0)

## 2023-09-27 NOTE — Telephone Encounter (Signed)
 PCP discussed matter with patient during their physical

## 2023-10-15 ENCOUNTER — Ambulatory Visit (INDEPENDENT_AMBULATORY_CARE_PROVIDER_SITE_OTHER)

## 2023-10-15 DIAGNOSIS — Z7901 Long term (current) use of anticoagulants: Secondary | ICD-10-CM | POA: Diagnosis not present

## 2023-10-15 LAB — POCT INR: INR: 1.6 — AB (ref 2.0–3.0)

## 2023-10-15 NOTE — Progress Notes (Signed)
 Pt denies any changes. Increase dose today to take 1 1/2 tablets and then change weekly dose to take 1 tablet daily except take 1 1/2 tablets on Saturday. Recheck in 3 weeks.

## 2023-10-15 NOTE — Patient Instructions (Addendum)
 Pre visit review using our clinic review tool, if applicable. No additional management support is needed unless otherwise documented below in the visit note.  Increase dose today to take 1 1/2 tablets and then change weekly dose to take 1 tablet daily except take 1 1/2 tablets on Saturday. Recheck in 3 weeks.

## 2023-11-01 ENCOUNTER — Other Ambulatory Visit: Payer: Self-pay | Admitting: Internal Medicine

## 2023-11-02 ENCOUNTER — Other Ambulatory Visit: Payer: Self-pay

## 2023-11-05 ENCOUNTER — Ambulatory Visit

## 2023-11-05 DIAGNOSIS — Z7901 Long term (current) use of anticoagulants: Secondary | ICD-10-CM | POA: Diagnosis not present

## 2023-11-05 LAB — POCT INR: INR: 1.8 — AB (ref 2.0–3.0)

## 2023-11-05 NOTE — Patient Instructions (Addendum)
 Pre visit review using our clinic review tool, if applicable. No additional management support is needed unless otherwise documented below in the visit note.  Increase dose today to take 1 1/2 tablets and then change weekly dose to take 1 tablet daily except take 1 1/2 tablets on Tuesday and Saturday. Recheck in 3 weeks.

## 2023-11-05 NOTE — Progress Notes (Signed)
 Indication: PE, DVT, Afib Increase dose today to take 1 1/2 tablets and then change weekly dose to take 1 tablet daily except take 1 1/2 tablets on Tuesday and Saturday. Recheck in 3 weeks.

## 2023-11-24 ENCOUNTER — Telehealth: Payer: Self-pay

## 2023-11-24 NOTE — Telephone Encounter (Signed)
 Copied from CRM #[id number]. Topic: Appointments - Appointment Cancel/Reschedule >> Nov 24, 2023 11:03 AM Viola F wrote: Patient called to cancel appt with the coumadin  clinic 11/26/23 - please call him to reschedule at [phone]

## 2023-11-24 NOTE — Telephone Encounter (Signed)
 LVM

## 2023-11-25 NOTE — Telephone Encounter (Signed)
 LVM

## 2023-11-26 ENCOUNTER — Ambulatory Visit

## 2023-11-26 NOTE — Telephone Encounter (Signed)
Contacted pt and RS for next week. Pt verbalized understanding.

## 2023-11-29 NOTE — Telephone Encounter (Signed)
 Review of [facility] coumadin  clinic schedule on 11/21. Will need to move pt's apt at 4 pm. Will need to RS for sometime before 1:30.

## 2023-11-30 NOTE — Telephone Encounter (Signed)
 R/S apt.

## 2023-12-03 ENCOUNTER — Ambulatory Visit

## 2023-12-14 ENCOUNTER — Ambulatory Visit

## 2023-12-23 ENCOUNTER — Telehealth: Payer: Self-pay

## 2023-12-23 NOTE — Telephone Encounter (Signed)
 Copied from CRM #[id number]. Topic: Appointments - Scheduling Inquiry for Clinic >> Dec 23, 2023  8:50 AM Berneda FALCON wrote: Reason for CRM: Patient is calling to reschedule his coumadin  clinic appt at [facility] tomorrow (12/12). This is not something I have the ability to reschedule on my end, so I called the CAL. I spoke to Diane who let me know this is not something she has access to.   Please cancel and reschedule this appt for him as I do not want him considered a [facility]NS for this.  Patient callback is [phone] (home)

## 2023-12-23 NOTE — Telephone Encounter (Signed)
Contacted pt and RS apt for next week.

## 2023-12-24 ENCOUNTER — Ambulatory Visit

## 2023-12-31 ENCOUNTER — Ambulatory Visit

## 2023-12-31 DIAGNOSIS — Z7901 Long term (current) use of anticoagulants: Secondary | ICD-10-CM | POA: Diagnosis not present

## 2023-12-31 LAB — POCT INR: INR: 2 (ref 2.0–3.0)

## 2023-12-31 NOTE — Patient Instructions (Addendum)
 Pre visit review using our clinic review tool, if applicable. No additional management support is needed unless otherwise documented below in the visit note.  Change weekly dose to take 1 tablet daily except take 1 1/2 tablets on Monday, Wednesday and Friday. Recheck in 4 weeks.

## 2023-12-31 NOTE — Progress Notes (Addendum)
 Indication: PE, DVT, Afib Pt has been losing weight due to exercising daily. He reports over the entire time of trying to lose wt he has lost over 100 lbs. It is observed when pt's lose at least 30-40 lbs they are requiring higher doses of warfarin. Pt has had subtherapeutic INR results 3 results and before those INR was on the low side of range. Due to these factors will make a small increase in weekly dosing to bring INR closer to goal of 2.5 Pt was also c/o swollen L wrist due what he thinks was a gym injury. He requested an OV with PCP for the week of 1/5. Scheduled pt for 1/7 and advised if any worsening to contact the office. Pt verbalized understanding.  Pt was educated if any s/s of abnormal bruising or bleeding to go to ER. Pt verbalized understanding.  Change weekly dose to take 1 tablet daily except take 1 1/2 tablets on Monday, Wednesday and Friday. Recheck in 4 weeks.

## 2024-01-19 ENCOUNTER — Ambulatory Visit: Admitting: Internal Medicine

## 2024-01-27 ENCOUNTER — Ambulatory Visit: Admitting: Internal Medicine

## 2024-01-28 ENCOUNTER — Ambulatory Visit

## 2024-02-04 ENCOUNTER — Ambulatory Visit: Admitting: Internal Medicine

## 2024-02-04 ENCOUNTER — Ambulatory Visit

## 2024-02-04 ENCOUNTER — Encounter: Payer: Self-pay | Admitting: Internal Medicine

## 2024-02-04 VITALS — BP 126/78 | HR 54 | Temp 98.5°F | Ht 73.0 in | Wt 253.0 lb

## 2024-02-04 DIAGNOSIS — Z7901 Long term (current) use of anticoagulants: Secondary | ICD-10-CM

## 2024-02-04 DIAGNOSIS — M25532 Pain in left wrist: Secondary | ICD-10-CM

## 2024-02-04 DIAGNOSIS — M25432 Effusion, left wrist: Secondary | ICD-10-CM

## 2024-02-04 DIAGNOSIS — R739 Hyperglycemia, unspecified: Secondary | ICD-10-CM

## 2024-02-04 DIAGNOSIS — I10 Essential (primary) hypertension: Secondary | ICD-10-CM | POA: Diagnosis not present

## 2024-02-04 LAB — POCT INR: INR: 2.2 (ref 2.0–3.0)

## 2024-02-04 NOTE — Patient Instructions (Signed)
 Please be sure to see Sports Medicine on the first floor   Please continue all other medications as before, and refills have been done if requested.  Please have the pharmacy call with any other refills you may need.  Please continue your efforts at being more active, low cholesterol diet, and weight control.  Please keep your appointments with your specialists as you may have planned  Please go to the XRAY Department in the first floor for the x-ray testing  You will be contacted by phone if any changes need to be made immediately.  Otherwise, you will receive a letter about your results with an explanation, but please check with MyChart first.  Please make an Appointment to return in 6 months, or sooner if needed

## 2024-02-04 NOTE — Progress Notes (Signed)
 Indication: PE, DVT, Afib Pt also has apt with PCP today.  Continue 1 tablet daily except take 1 1/2 tablets on Monday, Wednesday and Friday. Recheck in 6 weeks.

## 2024-02-04 NOTE — Patient Instructions (Addendum)
Pre visit review using our clinic review tool, if applicable. No additional management support is needed unless otherwise documented below in the visit note.  Continue 1 tablet daily except take 1 1/2 tablets on Monday, Wednesday and Friday. Recheck in 6 weeks.

## 2024-02-04 NOTE — Progress Notes (Signed)
 Patient ID: [name], male   DOB: April 18, 1950, 74 y.o.   MRN: [id number]        Chief Complaint: follow up left medial wrist swelling and discomfort, htn, hyperglycemia       HPI:  [name] is a 74 y.o. male here with c/o 1 wk swelling and mild discomfort to the left medial wrist, but no trauma, fever, or other skin changes.  No falls or overuse.  Pt denies chest pain, increased sob or doe, wheezing, orthopnea, PND, increased LE swelling, palpitations, dizziness or syncope.   Pt denies polydipsia, polyuria, or new focal neuro s/s.          Wt Readings from Last 3 Encounters:  02/04/24 253 lb (114.8 kg)  09/24/23 234 lb (106.1 kg)  06/30/23 239 lb (108.4 kg)   BP Readings from Last 3 Encounters:  02/04/24 126/78  09/24/23 118/80  01/30/22 128/80         Past Medical History:  Diagnosis Date   AAA (abdominal aortic aneurysm) July 2013   Abdominal Aortic Endovascular Stent Graft 08/11/11. Bilateral. Right is 3.2 & the left is 3.6. Preclose repair bilateral common femoral artery. Placement of catheter in aorta x 2. Repair of aorta w/modular bifurcated prosthesis w/bilateral limbs.  Placement of right limb extension: 23 mm x 12 cm. Placement of left limb extension: 14 mm x 7 cm.   Anemia    From GI bleed in 05/11/11.    Chronic anticoagulation, PE DVT PAF    Chronic pain    Depression    Dyslipidemia    Edema of left lower extremity    S/P I&D 08/30/06   GI bleed 04/2011   With anemia. EGD & colonoscopy 05/11/2011   History of alcohol dependence (HCC)    HTN (hypertension)    Hyperlipemia    Hypertension    Iliac artery aneurysm, bilateral    PVD, bilat common iliac aneurysms [443.9]   Iliopsoas muscle hematoma 04/2011   Lung nodule 2008   Morbid obesity (HCC)    Morbid obesity with BMI of 40.0-44.9, adult [facility])    Obstructive sleep apnea July 2013   Not wearing CPAP. Split-Night sleep study 06/03/11 AHI = 27.43/hr (TOTAL SLEEP TIME 105 minutes) and REM AHI = 120.00/hr    Osteoarthritis of both knees    Bilateral Arthroscopy in 2000 & 2008. Both were complicated w/venous thromboembolism S/P IVC filter placement due to one being massive PE in 2008.   PAF (paroxysmal atrial fibrillation)/atrial flutter 02/28/2010   ECHO : normal LV size, moderate concentricLVH, normal EF, normal atrial sizes, RV was normal but previously dilated;;  Myoview : Normal perfusion study. Low risk scan.   PAH (pulmonary artery hypertension) (HCC)    Prolonged QT interval    Psoriasis    Pulmonary embolism (HCC) 2008   DVT --> Massive PE with right-sided strain. On coumadin . ECHO 02/28/10 showed normal LV size, moderate concentricLVH, normal EF, normal atrial sizes, RV was normal but previously dilated.   Past Surgical History:  Procedure Laterality Date   ABDOMINAL AORTAGRAM N/A 05/14/2011   Procedure: ABDOMINAL AORTAGRAM;  Surgeon: Redell LITTIE Door, MD;  Location: [facility] CATH LAB;  Service: Cardiovascular;  Laterality: N/A;   Abdominal Aortic Evar  08/11/11   Bilateral. Right is 3.2 & the left is 3.6. Preclose repair bilateral common femoral artery. Placement of catheter in aorta x 2. Repair of aorta w/modular bifurcated prosthesis w/bilateral limbs.  Placement of right limb extension: 23 mm x  12 cm. Placement of left limb extension: 14 mm x 7 cm.   ACHILLES TENDON SURGERY     COLONOSCOPY  05/11/2011   For GI bleed. Performed by Dr. Renaye Sous, MD. Also had a EGD performed at this time.   COLONOSCOPY  05/12/2011   EMBOLIZATION N/A 07/30/2011   Procedure: EMBOLIZATION;  Surgeon: Redell LITTIE Door, MD;  Location[facility] CATH LAB;  Service: Cardiovascular;  Laterality: N/A;   ESOPHAGOGASTRODUODENOSCOPY  05/08/2011   For GI bleed/Anemia. Also had a colonoscopy at this time.   INSERTION OF VENA CAVA FILTER N/A 05/14/2011   Procedure: INSERTION OF VENA CAVA FILTER;  Surgeon: Redell LITTIE Door, MD;  Location: [facility] CATH LAB;  Service: Cardiovascular;  Laterality: N/A;   KNEE ARTHROSCOPY Bilateral 2000 & 2008   DJD  knees. Both were complicated w/venous thromboembolism S/P IVC filter placement due to one being massive PE in 2008.   KNEE SURGERY  1999   LOWER EXTREMITY ANGIOGRAM N/A 05/14/2011   Procedure: LOWER EXTREMITY ANGIOGRAM;  Surgeon: Redell LITTIE Door, MD;  Location: [facility] CATH LAB;  Service: Cardiovascular;  Laterality: N/A;    reports that he quit smoking about 17 years ago. His smoking use included cigarettes. He started smoking about 19 years ago. He has a 1 pack-year smoking history. He has never used smokeless tobacco. He reports current alcohol use of about 1.0 - 2.0 standard drink of alcohol per week. He reports current drug use. family history includes Arthritis in his father and mother; CAD (age of onset: 35) in his father; Cancer in his brother and mother. Allergies[1] Medications Ordered Prior to Encounter[2]      ROS:  All others reviewed and negative.  Objective        PE:  BP 126/78 (BP Location: Right Arm, Patient Position: Sitting, Cuff Size: Normal)   Pulse (!) 54   Temp 98.5 F (36.9 C) (Oral)   Ht 6' 1 (1.854 m)   Wt 253 lb (114.8 kg)   SpO2 98%   BMI 33.38 kg/m                 Constitutional: Pt appears in NAD               HENT: Head: NCAT.                Right Ear: External ear normal.                 Left Ear: External ear normal.                Eyes: . Pupils are equal, round, and reactive to light. Conjunctivae and EOM are normal               Nose: without d/c or deformity               Neck: Neck supple. Gross normal ROM               Cardiovascular: Normal rate and regular rhythm.                 Pulmonary/Chest: Effort normal and breath sounds without rales or wheezing.                Abd:  Soft, NT, ND, + BS, no organomegaly               Neurological: Pt is alert. At baseline orientation, motor grossly intact; left medial wrist with `1+ soft but mild tender  swelling area non discrete, o/w neurovasc intact               Skin: Skin is warm. No rashes, no other new  lesions, LE edema - none,               Psychiatric: Pt behavior is normal without agitation   Micro: none  Cardiac tracings I have personally interpreted today:  none  Pertinent Radiological findings (summarize): none   Lab Results  Component Value Date   WBC 5.9 09/24/2023   HGB 14.4 09/24/2023   HCT 43.5 09/24/2023   PLT 184.0 09/24/2023   GLUCOSE 79 09/24/2023   CHOL 120 09/24/2023   TRIG 92.0 09/24/2023   HDL 42.50 09/24/2023   LDLCALC 59 09/24/2023   ALT 15 09/24/2023   AST 20 09/24/2023   NA 142 09/24/2023   K 3.4 (L) 09/24/2023   CL 102 09/24/2023   CREATININE 1.34 09/24/2023   BUN 19 09/24/2023   CO2 29 09/24/2023   TSH 1.67 09/24/2023   PSA 0.76 09/24/2023   INR 2.2 02/04/2024   HGBA1C 6.1 09/24/2023   Assessment/Plan:  [name] is a 74 y.o. Black or African American [2] male with  has a past medical history of AAA (abdominal aortic aneurysm) (July 2013), Anemia, Chronic anticoagulation, PE DVT PAF, Chronic pain, Depression, Dyslipidemia, Edema of left lower extremity, GI bleed (04/2011), History of alcohol dependence (HCC), HTN (hypertension), Hyperlipemia, Hypertension, Iliac artery aneurysm, bilateral, Iliopsoas muscle hematoma (04/2011), Lung nodule (2008), Morbid obesity (HCC), Morbid obesity with BMI of 40.0-44.9, adult [facility]), Obstructive sleep apnea (July 2013), Osteoarthritis of both knees, PAF (paroxysmal atrial fibrillation)/atrial flutter (02/28/2010), PAH (pulmonary artery hypertension) (HCC), Prolonged QT interval, Psoriasis, and Pulmonary embolism (HCC) (2008).  HTN (hypertension) BP Readings from Last 3 Encounters:  02/04/24 126/78  09/24/23 118/80  01/30/22 128/80   Stable, pt to continue medical treatment tiazac  360 every day, hct 12.5 every day,   Hyperglycemia Lab Results  Component Value Date   HGBA1C 6.1 09/24/2023   Stable, pt to continue current medical treatment  - diet, wt control   Pain and swelling of left wrist Etiology  unclear, ? Pseudoguot vs other, for xray, and refer sports medicine  Followup: Return in about 6 months (around 08/03/2024).  Lynwood Rush, MD 02/05/2024 3:19 PM [facility]     [1]  Allergies Allergen Reactions   Crestor [Rosuvastatin] Swelling  [2]  Current Outpatient Medications on File Prior to Visit  Medication Sig Dispense Refill   allopurinol  (ZYLOPRIM ) 100 MG tablet TAKE 1 TABLET BY MOUTH DAILY 60 tablet 5   atorvastatin  (LIPITOR) 40 MG tablet TAKE 1 TABLET BY MOUTH DAILY 60 tablet 5   augmented betamethasone dipropionate (DIPROLENE-AF) 0.05 % cream Apply 1 application topically 2 (two) times daily. (Patient not taking: Reported on 09/24/2023)     Cholecalciferol (VITAMIN D3) 50 MCG (2000 UT) capsule Take 2,000 Units by mouth daily.     clobetasol  cream (TEMOVATE ) 0.05 % Apply 1 Application topically 2 (two) times daily. 30 g 1   diclofenac  Sodium (VOLTAREN ) 1 % GEL Apply 2 g topically 4 (four) times daily. (Patient not taking: Reported on 09/24/2023) 150 g 0   dicyclomine  (BENTYL ) 20 MG tablet Take 1 tablet (20 mg total) by mouth 3 (three) times daily as needed for spasms (abdominal pain/spasm). (Patient not taking: Reported on 09/24/2023) 20 tablet 0   digoxin  (LANOXIN ) 0.125 MG tablet  TAKE 1 TABLET BY MOUTH DAILY 60 tablet 5   diltiazem  (TIAZAC ) 360 MG 24 hr capsule TAKE 1 CAPSULE BY MOUTH DAILY 60 capsule 5   hydrochlorothiazide  (MICROZIDE ) 12.5 MG capsule TAKE 1 CAPSULE BY MOUTH DAILY 100 capsule 2   HYDROcodone -acetaminophen  (NORCO) 7.5-325 MG tablet Take 1 tablet by mouth every 6 (six) hours as needed for moderate pain. (Patient not taking: Reported on 09/24/2023) 30 tablet 0   traZODone  (DESYREL ) 50 MG tablet Take 0.5-1 tablets (25-50 mg total) by mouth at bedtime as needed for sleep. 30 tablet 3   VIAGRA  100 MG tablet TAKE 1/2 TO 1 (ONE-HALF TO ONE) TABLET BY MOUTH DAILY AS NEEDED FOR  ERECTILE  DYSFUNCTION 10  tablet 0   warfarin (COUMADIN ) 5 MG tablet TAKE 1 TABLET BY MOUTH DAILY OR AS DIRECTED BY ANTICOAGULATION CLINIC 105 tablet 1   No current facility-administered medications on file prior to visit.

## 2024-02-05 ENCOUNTER — Encounter: Payer: Self-pay | Admitting: Internal Medicine

## 2024-02-05 DIAGNOSIS — M25432 Effusion, left wrist: Secondary | ICD-10-CM | POA: Insufficient documentation

## 2024-02-05 NOTE — Assessment & Plan Note (Signed)
 Lab Results  Component Value Date   HGBA1C 6.1 09/24/2023   Stable, pt to continue current medical treatment  - diet, wt control

## 2024-02-05 NOTE — Assessment & Plan Note (Signed)
 BP Readings from Last 3 Encounters:  02/04/24 126/78  09/24/23 118/80  01/30/22 128/80   Stable, pt to continue medical treatment tiazac  360 every day, hct 12.5 every day,

## 2024-02-05 NOTE — Assessment & Plan Note (Signed)
 Etiology unclear, ? Pseudoguot vs other, for xray, and refer sports medicine

## 2024-02-08 ENCOUNTER — Ambulatory Visit: Payer: Self-pay | Admitting: Internal Medicine

## 2024-02-10 ENCOUNTER — Telehealth: Payer: Self-pay

## 2024-02-10 MED ORDER — VIAGRA 100 MG PO TABS: ORAL_TABLET | ORAL | 5 refills | Status: AC

## 2024-02-10 NOTE — Telephone Encounter (Signed)
 Copied from CRM #[id number]. Topic: Clinical - Medication Refill >> Feb 10, 2024  3:01 PM Rea C wrote: Medication: VIAGRA  100 MG tablet  Has the patient contacted their pharmacy? No (Agent: If no, request that the patient contact the pharmacy for the refill. If patient does not wish to contact the pharmacy document the reason why and proceed with request.) (Agent: If yes, when and what did the pharmacy advise?)  This is the patient's preferred pharmacy:  [id number] - [place] ([place] - [address] [place] ([place]) [place] [address] Phone: [phone] Fax: [phone]   Is this the correct pharmacy for this prescription? Yes If no, delete pharmacy and type the correct one.   Has the prescription been filled recently? Yes  Is the patient out of the medication? Yes  Has the patient been seen for an appointment in the last year OR does the patient have an upcoming appointment? Yes  Can we respond through MyChart? Yes  Agent: Please be advised that Rx refills may take up to 3 business days. We ask that you follow-up with your pharmacy.

## 2024-02-10 NOTE — Addendum Note (Signed)
 Addended by: NORLEEN LYNWOOD ORN on: 02/10/2024 04:18 PM   Modules accepted: Orders

## 2024-02-10 NOTE — Telephone Encounter (Signed)
 Ok this refill is done

## 2024-03-17 ENCOUNTER — Ambulatory Visit

## 2024-04-28 ENCOUNTER — Encounter: Admitting: Nurse Practitioner

## 2024-06-30 ENCOUNTER — Ambulatory Visit
